# Patient Record
Sex: Female | Born: 1935 | Race: White | Hispanic: No | Marital: Married | Smoking: Never smoker
Health system: Southern US, Community
[De-identification: ages and names within clinical notes are randomized; demographics above are authoritative.]

## PROBLEM LIST (undated history)

## (undated) DIAGNOSIS — Z789 Other specified health status: Secondary | ICD-10-CM

## (undated) DIAGNOSIS — F419 Anxiety disorder, unspecified: Secondary | ICD-10-CM

## (undated) DIAGNOSIS — J329 Chronic sinusitis, unspecified: Secondary | ICD-10-CM

## (undated) DIAGNOSIS — M545 Low back pain, unspecified: Secondary | ICD-10-CM

## (undated) DIAGNOSIS — I340 Nonrheumatic mitral (valve) insufficiency: Secondary | ICD-10-CM

## (undated) DIAGNOSIS — R9430 Abnormal result of cardiovascular function study, unspecified: Secondary | ICD-10-CM

## (undated) DIAGNOSIS — I341 Nonrheumatic mitral (valve) prolapse: Secondary | ICD-10-CM

## (undated) DIAGNOSIS — K601 Chronic anal fissure: Secondary | ICD-10-CM

## (undated) DIAGNOSIS — K219 Gastro-esophageal reflux disease without esophagitis: Secondary | ICD-10-CM

## (undated) DIAGNOSIS — M797 Fibromyalgia: Secondary | ICD-10-CM

## (undated) DIAGNOSIS — M1990 Unspecified osteoarthritis, unspecified site: Secondary | ICD-10-CM

## (undated) DIAGNOSIS — K635 Polyp of colon: Secondary | ICD-10-CM

## (undated) DIAGNOSIS — K449 Diaphragmatic hernia without obstruction or gangrene: Secondary | ICD-10-CM

## (undated) DIAGNOSIS — E785 Hyperlipidemia, unspecified: Secondary | ICD-10-CM

## (undated) DIAGNOSIS — M419 Scoliosis, unspecified: Secondary | ICD-10-CM

## (undated) DIAGNOSIS — E041 Nontoxic single thyroid nodule: Secondary | ICD-10-CM

## (undated) DIAGNOSIS — C439 Malignant melanoma of skin, unspecified: Secondary | ICD-10-CM

## (undated) HISTORY — DX: Polyp of colon: K63.5

## (undated) HISTORY — PX: ABDOMINAL HYSTERECTOMY: SHX81

## (undated) HISTORY — DX: Nontoxic single thyroid nodule: E04.1

## (undated) HISTORY — DX: Low back pain, unspecified: M54.50

## (undated) HISTORY — PX: BACK SURGERY: SHX140

## (undated) HISTORY — DX: Malignant melanoma of skin, unspecified: C43.9

## (undated) HISTORY — DX: Hyperlipidemia, unspecified: E78.5

## (undated) HISTORY — PX: FOOT SURGERY: SHX648

## (undated) HISTORY — DX: Unspecified osteoarthritis, unspecified site: M19.90

## (undated) HISTORY — DX: Nonrheumatic mitral (valve) insufficiency: I34.0

## (undated) HISTORY — DX: Fibromyalgia: M79.7

## (undated) HISTORY — DX: Other specified health status: Z78.9

## (undated) HISTORY — DX: Chronic anal fissure: K60.1

## (undated) HISTORY — PX: CHOLECYSTECTOMY: SHX55

## (undated) HISTORY — DX: Low back pain: M54.5

## (undated) HISTORY — PX: HERNIA REPAIR: SHX51

## (undated) HISTORY — DX: Abnormal result of cardiovascular function study, unspecified: R94.30

## (undated) HISTORY — DX: Gastro-esophageal reflux disease without esophagitis: K21.9

## (undated) HISTORY — PX: NASAL SINUS SURGERY: SHX719

## (undated) HISTORY — DX: Nonrheumatic mitral (valve) prolapse: I34.1

## (undated) HISTORY — DX: Diaphragmatic hernia without obstruction or gangrene: K44.9

## (undated) HISTORY — DX: Chronic sinusitis, unspecified: J32.9

## (undated) HISTORY — DX: Anxiety disorder, unspecified: F41.9

## (undated) HISTORY — DX: Scoliosis, unspecified: M41.9

---

## 1997-08-01 ENCOUNTER — Ambulatory Visit (HOSPITAL_COMMUNITY): Admission: RE | Admit: 1997-08-01 | Discharge: 1997-08-02 | Payer: Self-pay | Admitting: General Surgery

## 1997-08-09 ENCOUNTER — Other Ambulatory Visit: Admission: RE | Admit: 1997-08-09 | Discharge: 1997-08-09 | Payer: Self-pay | Admitting: General Surgery

## 1998-01-26 ENCOUNTER — Other Ambulatory Visit: Admission: RE | Admit: 1998-01-26 | Discharge: 1998-01-26 | Payer: Self-pay | Admitting: Plastic Surgery

## 1999-07-05 ENCOUNTER — Encounter: Payer: Self-pay | Admitting: Internal Medicine

## 1999-07-25 ENCOUNTER — Encounter: Admission: RE | Admit: 1999-07-25 | Discharge: 1999-07-25 | Payer: Self-pay | Admitting: Obstetrics and Gynecology

## 1999-07-25 ENCOUNTER — Encounter: Payer: Self-pay | Admitting: Obstetrics and Gynecology

## 1999-08-07 ENCOUNTER — Ambulatory Visit (HOSPITAL_COMMUNITY): Admission: RE | Admit: 1999-08-07 | Discharge: 1999-08-07 | Payer: Self-pay | Admitting: Internal Medicine

## 1999-08-07 ENCOUNTER — Encounter: Payer: Self-pay | Admitting: Internal Medicine

## 2000-01-30 ENCOUNTER — Encounter: Admission: RE | Admit: 2000-01-30 | Discharge: 2000-01-30 | Payer: Self-pay | Admitting: Family Medicine

## 2000-01-30 ENCOUNTER — Encounter: Payer: Self-pay | Admitting: Family Medicine

## 2000-08-01 ENCOUNTER — Encounter: Payer: Self-pay | Admitting: Obstetrics and Gynecology

## 2000-08-01 ENCOUNTER — Encounter: Admission: RE | Admit: 2000-08-01 | Discharge: 2000-08-01 | Payer: Self-pay | Admitting: Obstetrics and Gynecology

## 2001-01-06 ENCOUNTER — Encounter (INDEPENDENT_AMBULATORY_CARE_PROVIDER_SITE_OTHER): Payer: Self-pay | Admitting: *Deleted

## 2001-01-06 ENCOUNTER — Ambulatory Visit (HOSPITAL_COMMUNITY): Admission: RE | Admit: 2001-01-06 | Discharge: 2001-01-06 | Payer: Self-pay | Admitting: Urology

## 2001-08-04 ENCOUNTER — Encounter: Admission: RE | Admit: 2001-08-04 | Discharge: 2001-08-04 | Payer: Self-pay | Admitting: Obstetrics and Gynecology

## 2001-08-04 ENCOUNTER — Encounter: Payer: Self-pay | Admitting: Obstetrics and Gynecology

## 2002-08-09 ENCOUNTER — Encounter: Admission: RE | Admit: 2002-08-09 | Discharge: 2002-08-09 | Payer: Self-pay | Admitting: Obstetrics and Gynecology

## 2002-08-09 ENCOUNTER — Encounter: Payer: Self-pay | Admitting: Obstetrics and Gynecology

## 2003-01-05 ENCOUNTER — Encounter: Payer: Self-pay | Admitting: Rheumatology

## 2003-01-05 ENCOUNTER — Encounter: Admission: RE | Admit: 2003-01-05 | Discharge: 2003-01-05 | Payer: Self-pay | Admitting: Rheumatology

## 2003-08-16 ENCOUNTER — Encounter: Admission: RE | Admit: 2003-08-16 | Discharge: 2003-08-16 | Payer: Self-pay | Admitting: Obstetrics and Gynecology

## 2004-04-03 ENCOUNTER — Ambulatory Visit: Payer: Self-pay | Admitting: Cardiology

## 2004-08-17 ENCOUNTER — Encounter: Admission: RE | Admit: 2004-08-17 | Discharge: 2004-08-17 | Payer: Self-pay | Admitting: Obstetrics and Gynecology

## 2004-08-28 ENCOUNTER — Ambulatory Visit: Payer: Self-pay | Admitting: Internal Medicine

## 2004-08-29 ENCOUNTER — Encounter (INDEPENDENT_AMBULATORY_CARE_PROVIDER_SITE_OTHER): Payer: Self-pay | Admitting: *Deleted

## 2004-08-29 ENCOUNTER — Ambulatory Visit: Payer: Self-pay | Admitting: Internal Medicine

## 2004-11-13 ENCOUNTER — Ambulatory Visit: Payer: Self-pay | Admitting: Cardiology

## 2005-01-04 ENCOUNTER — Ambulatory Visit: Payer: Self-pay | Admitting: Cardiology

## 2005-06-04 ENCOUNTER — Ambulatory Visit: Payer: Self-pay | Admitting: Cardiology

## 2005-08-19 ENCOUNTER — Encounter: Admission: RE | Admit: 2005-08-19 | Discharge: 2005-08-19 | Payer: Self-pay | Admitting: Obstetrics and Gynecology

## 2005-10-31 ENCOUNTER — Ambulatory Visit: Payer: Self-pay | Admitting: Internal Medicine

## 2005-11-21 ENCOUNTER — Ambulatory Visit: Payer: Self-pay | Admitting: Cardiology

## 2006-02-20 ENCOUNTER — Ambulatory Visit: Payer: Self-pay | Admitting: Internal Medicine

## 2006-02-27 ENCOUNTER — Ambulatory Visit: Payer: Self-pay

## 2006-02-27 ENCOUNTER — Encounter: Payer: Self-pay | Admitting: Cardiology

## 2006-03-20 ENCOUNTER — Ambulatory Visit: Payer: Self-pay | Admitting: Otolaryngology

## 2006-04-18 ENCOUNTER — Ambulatory Visit: Payer: Self-pay | Admitting: Cardiology

## 2006-06-06 ENCOUNTER — Emergency Department (HOSPITAL_COMMUNITY): Admission: EM | Admit: 2006-06-06 | Discharge: 2006-06-06 | Payer: Self-pay | Admitting: Family Medicine

## 2006-06-26 ENCOUNTER — Ambulatory Visit: Payer: Self-pay | Admitting: Cardiology

## 2006-06-26 LAB — CONVERTED CEMR LAB
Cholesterol: 193 mg/dL (ref 0–200)
HDL: 52 mg/dL (ref >39.0)
LDL Cholesterol: 112 mg/dL — ABNORMAL HIGH (ref 0–99)
Total CHOL/HDL Ratio: 3.7
Total Protein: 6.6 g/dL (ref 6.0–8.3)
Triglycerides: 146 mg/dL (ref 0–149)
VLDL: 29 mg/dL (ref 0–40)

## 2006-08-21 ENCOUNTER — Ambulatory Visit: Payer: Self-pay | Admitting: Cardiology

## 2006-08-22 ENCOUNTER — Encounter: Admission: RE | Admit: 2006-08-22 | Discharge: 2006-08-22 | Payer: Self-pay | Admitting: Obstetrics and Gynecology

## 2006-09-11 ENCOUNTER — Ambulatory Visit: Payer: Self-pay | Admitting: Cardiology

## 2006-09-11 LAB — CONVERTED CEMR LAB
ALT: 17 units/L (ref 0–40)
Alkaline Phosphatase: 58 units/L (ref 39–117)
Bilirubin, Direct: 0.1 mg/dL (ref 0.0–0.3)
HDL: 51.4 mg/dL (ref >39.0)
Total CHOL/HDL Ratio: 4.2
Triglycerides: 161 mg/dL — ABNORMAL HIGH (ref 0–149)
VLDL: 32 mg/dL (ref 0–40)

## 2006-10-15 ENCOUNTER — Ambulatory Visit: Payer: Self-pay | Admitting: Internal Medicine

## 2006-11-25 ENCOUNTER — Ambulatory Visit: Payer: Self-pay | Admitting: Internal Medicine

## 2006-11-25 ENCOUNTER — Encounter: Payer: Self-pay | Admitting: Internal Medicine

## 2006-12-25 ENCOUNTER — Ambulatory Visit: Payer: Self-pay | Admitting: Cardiology

## 2006-12-25 LAB — CONVERTED CEMR LAB
Direct LDL: 119.5 mg/dL
HDL: 46.7 mg/dL (ref >39.0)
Total CHOL/HDL Ratio: 4.2
VLDL: 41 mg/dL — ABNORMAL HIGH (ref 0–40)

## 2007-03-20 ENCOUNTER — Ambulatory Visit: Payer: Self-pay | Admitting: Cardiology

## 2007-07-16 ENCOUNTER — Ambulatory Visit: Payer: Self-pay | Admitting: Cardiology

## 2007-07-16 LAB — CONVERTED CEMR LAB
Albumin: 3.5 g/dL (ref 3.5–5.2)
Alkaline Phosphatase: 47 units/L (ref 39–117)
Cholesterol: 228 mg/dL (ref 0–200)
Direct LDL: 151.8 mg/dL
HDL: 55.1 mg/dL (ref >39.0)
Total CHOL/HDL Ratio: 4.1
Total Protein: 6.5 g/dL (ref 6.0–8.3)
Triglycerides: 147 mg/dL (ref 0–149)

## 2007-08-15 DIAGNOSIS — K449 Diaphragmatic hernia without obstruction or gangrene: Secondary | ICD-10-CM | POA: Insufficient documentation

## 2007-08-15 DIAGNOSIS — D126 Benign neoplasm of colon, unspecified: Secondary | ICD-10-CM | POA: Insufficient documentation

## 2007-08-15 DIAGNOSIS — K2270 Barrett's esophagus without dysplasia: Secondary | ICD-10-CM | POA: Insufficient documentation

## 2007-08-15 DIAGNOSIS — Z9849 Cataract extraction status, unspecified eye: Secondary | ICD-10-CM | POA: Insufficient documentation

## 2007-08-15 DIAGNOSIS — K219 Gastro-esophageal reflux disease without esophagitis: Secondary | ICD-10-CM | POA: Insufficient documentation

## 2007-08-15 DIAGNOSIS — M1990 Unspecified osteoarthritis, unspecified site: Secondary | ICD-10-CM | POA: Insufficient documentation

## 2007-08-28 ENCOUNTER — Encounter: Admission: RE | Admit: 2007-08-28 | Discharge: 2007-08-28 | Payer: Self-pay | Admitting: Obstetrics and Gynecology

## 2007-09-16 ENCOUNTER — Ambulatory Visit: Payer: Self-pay | Admitting: Cardiology

## 2007-12-17 ENCOUNTER — Ambulatory Visit: Payer: Self-pay | Admitting: Cardiology

## 2007-12-17 LAB — CONVERTED CEMR LAB
AST: 18 units/L (ref 0–37)
Albumin: 3.7 g/dL (ref 3.5–5.2)
Alkaline Phosphatase: 55 units/L (ref 39–117)
Cholesterol: 226 mg/dL (ref 0–200)
Direct LDL: 132 mg/dL
Total Protein: 6.6 g/dL (ref 6.0–8.3)
Triglycerides: 146 mg/dL (ref 0–149)
VLDL: 29 mg/dL (ref 0–40)

## 2007-12-23 ENCOUNTER — Ambulatory Visit: Payer: Self-pay | Admitting: Cardiology

## 2008-01-29 ENCOUNTER — Ambulatory Visit (HOSPITAL_COMMUNITY): Admission: RE | Admit: 2008-01-29 | Discharge: 2008-01-29 | Payer: Self-pay | Admitting: Rheumatology

## 2008-06-16 ENCOUNTER — Encounter: Payer: Self-pay | Admitting: Cardiology

## 2008-08-15 ENCOUNTER — Encounter: Payer: Self-pay | Admitting: Cardiology

## 2008-08-29 ENCOUNTER — Encounter: Admission: RE | Admit: 2008-08-29 | Discharge: 2008-08-29 | Payer: Self-pay | Admitting: Internal Medicine

## 2008-10-05 ENCOUNTER — Encounter (INDEPENDENT_AMBULATORY_CARE_PROVIDER_SITE_OTHER): Payer: Self-pay | Admitting: *Deleted

## 2008-10-27 ENCOUNTER — Ambulatory Visit: Payer: Self-pay | Admitting: Internal Medicine

## 2008-12-31 ENCOUNTER — Encounter: Payer: Self-pay | Admitting: Cardiology

## 2008-12-31 DIAGNOSIS — IMO0001 Reserved for inherently not codable concepts without codable children: Secondary | ICD-10-CM | POA: Insufficient documentation

## 2009-01-02 ENCOUNTER — Ambulatory Visit: Payer: Self-pay | Admitting: Cardiology

## 2009-01-10 ENCOUNTER — Encounter: Payer: Self-pay | Admitting: Cardiology

## 2009-02-15 ENCOUNTER — Encounter (INDEPENDENT_AMBULATORY_CARE_PROVIDER_SITE_OTHER): Payer: Self-pay | Admitting: *Deleted

## 2009-03-14 ENCOUNTER — Encounter (INDEPENDENT_AMBULATORY_CARE_PROVIDER_SITE_OTHER): Payer: Self-pay | Admitting: *Deleted

## 2009-03-14 ENCOUNTER — Ambulatory Visit: Payer: Self-pay | Admitting: Internal Medicine

## 2009-03-15 ENCOUNTER — Encounter: Admission: RE | Admit: 2009-03-15 | Discharge: 2009-03-15 | Payer: Self-pay | Admitting: Surgery

## 2009-03-28 ENCOUNTER — Ambulatory Visit: Payer: Self-pay | Admitting: Internal Medicine

## 2009-04-05 ENCOUNTER — Encounter: Admission: RE | Admit: 2009-04-05 | Discharge: 2009-04-05 | Payer: Self-pay | Admitting: Internal Medicine

## 2009-04-18 ENCOUNTER — Other Ambulatory Visit: Admission: RE | Admit: 2009-04-18 | Discharge: 2009-04-18 | Payer: Self-pay | Admitting: Diagnostic Radiology

## 2009-04-18 ENCOUNTER — Encounter: Admission: RE | Admit: 2009-04-18 | Discharge: 2009-04-18 | Payer: Self-pay | Admitting: Surgery

## 2009-05-19 ENCOUNTER — Ambulatory Visit (HOSPITAL_COMMUNITY): Admission: RE | Admit: 2009-05-19 | Discharge: 2009-05-19 | Payer: Self-pay | Admitting: Rheumatology

## 2009-06-28 ENCOUNTER — Encounter: Payer: Self-pay | Admitting: Cardiology

## 2009-09-05 ENCOUNTER — Encounter: Admission: RE | Admit: 2009-09-05 | Discharge: 2009-09-05 | Payer: Self-pay | Admitting: Internal Medicine

## 2009-10-10 ENCOUNTER — Ambulatory Visit (HOSPITAL_COMMUNITY): Admission: RE | Admit: 2009-10-10 | Discharge: 2009-10-10 | Payer: Self-pay | Admitting: Rheumatology

## 2009-12-15 ENCOUNTER — Ambulatory Visit: Payer: Self-pay | Admitting: Cardiology

## 2010-01-12 ENCOUNTER — Ambulatory Visit: Payer: Self-pay | Admitting: Otolaryngology

## 2010-01-23 ENCOUNTER — Telehealth (INDEPENDENT_AMBULATORY_CARE_PROVIDER_SITE_OTHER): Payer: Self-pay | Admitting: *Deleted

## 2010-03-10 ENCOUNTER — Emergency Department: Payer: Self-pay | Admitting: Emergency Medicine

## 2010-04-03 ENCOUNTER — Telehealth: Payer: Self-pay | Admitting: Cardiology

## 2010-05-06 ENCOUNTER — Encounter: Payer: Self-pay | Admitting: Rheumatology

## 2010-05-17 NOTE — Progress Notes (Signed)
Summary: heart racing and questions about medication   Phone Note Call from Patient Call back at Home Phone [phone]   Caller: Patient Summary of Call: Pt calling with questions about medications pt is having heart racing up to a 120 Initial call taken by: Judie Grieve,  April 03, 2010 8:57 AM  Follow-up for Phone Call        spoke w/pts daughter she states pt was in the hospital and had back surgery and dev. MRSA infection and had to have a 2nd surgery, pt is home now and daughter is concerned b/c pts HR seems to be elevated, PT noticed it around 120 yest, nurse was also out to her home yest and noted HR around 110, she thinks her BP has been elevated around 140, she states pt has been in a lot of pain and is unable to take anything except tylenol due to sundowners, she is sch to see PCP tom advised they can do EKG and check CBC and discuss any other options for pain management as this could be the reason for elevated HR she is agreeable  Follow-up by: Meredith Staggers, RN,  April 03, 2010 10:12 AM

## 2010-05-17 NOTE — Assessment & Plan Note (Signed)
Summary: f1y  Medications Added NIASPAN 1000 MG CR-TABS (NIACIN (ANTIHYPERLIPIDEMIC)) at bedtime      Allergies Added: ! NSAIDS  Visit Type:  Follow-up Primary Provider:  Tomi Bamberger, MD  CC:  mitral valve prolapse.  History of Present Illness: The patient is seen for followup of mitral valve prolapse and chest pain.  I saw her last December, 2010.  She has rare chest discomfort.  She's not having any shortness of breath.  Dr. Toni Arthurs is working with her and trying everything possible to treat her lipid abnormalities.  Unfortunately the patient is very intolerant of her meds.  Current Medications (verified): 1)  Nexium 40 Mg Cpdr (Esomeprazole Magnesium) .... Take 1 Tablet By Mouth Two Times A Day 2)  Metoprolol Tartrate 50 Mg Tabs (Metoprolol Tartrate) .... Take One-Half Tablet By Mouth Twice A Day 3)  Niaspan 1000 Mg Cr-Tabs (Niacin (Antihyperlipidemic)) .... At Bedtime 4)  Neurontin 100 Mg Caps (Gabapentin) .... Take 1 Capsule Up To Three Times A Day 5)  Calcium Carbonate-Vitamin D 600-400 Mg-Unit  Tabs (Calcium Carbonate-Vitamin D) .... Take One Tablet By Mouth Twice Daily. 6)  Fish Oil   Oil (Fish Oil) .... 1000mg  Three Times A Day 7)  Co Q-10 100 Mg Caps (Coenzyme Q10) .... Take One Tablet By Mouth Twice Daily.  Allergies (verified): 1)  ! Nsaids  Past History:  Past Medical History: Dyslipidemia Chest pain.... felt to be atypical and noncardiac in the past Statin intolerance... as of September, 2009 no further attempts to use Mitral valve prolapse... mild... trivial mitral regurgitation....echo November 2007 Ejection fraction 60%  . echo... November 2007 Atrial septum bows from left to right compatible with increased LA pressure... echo... November 2007 HIATAL HERNIA (ICD-553.3) OSTEOARTHRITIS, GENERALIZED (ICD-715.00) DYSLIPIDEMIA (ICD-272.4) GERD (ICD-530.81) ADENOMATOUS COLONIC POLYP (ICD-211.3) BARRETTS ESOPHAGUS (ICD-530.85) MITRAL VALVE PROLAPSE, HX OF  (ICD-V12.50) CATARACT EXTRACTION, HX OF (ICD-V45.61) Fibromyalgia Sinus and nose surgery.... multiple occasions in the past  Review of Systems       Patient denies fever, chills, headache, sweats, rash, change in vision, change in hearing, chest pain, cough, nausea vomiting, urinary symptoms.  All of the systems are reviewed and are negative.  Vital Signs:  Patient profile:   75 year old female Height:      58 inches Weight:      140 pounds BMI:     29.37 Pulse rate:   84 / minute BP sitting:   112 / 70  (left arm) Cuff size:   regular  Vitals Entered By: Hardin Negus, RMA (December 15, 2009 10:12 AM)  Physical Exam  General:  patient is stable. Eyes:  no xanthelasma. Neck:  no jugular venous extension. Lungs:  lungs are clear.  Respiratory effort is not labored. Heart:  cardiac exam reveals S1-S2.  There is a very soft systolic murmur. Abdomen:  abdomen is soft. Extremities:  no peripheral edema. Psych:  patient is oriented to person time and place her affect is normal.   Impression & Recommendations:  Problem # 1:  MITRAL REGURGITATION (ICD-396.3) By physical exam the patient's mitral regurgitation is limited.  She does have mitral valve prolapse by history but it is mild.  I feel she does not need a followup echo at this time.  We will reconsider next year.  Problem # 2:  CHEST PAIN (ICD-786.50)  Her updated medication list for this problem includes:    Metoprolol Tartrate 50 Mg Tabs (Metoprolol tartrate) .Marland Kitchen... Take one-half tablet by mouth twice a day The  patient has infrequent chest pain.  It does not sound ischemic.  No further workup needed.  Problem # 3:  DYSLIPIDEMIA (ICD-272.4)  Her updated medication list for this problem includes:    Niaspan 1000 Mg Cr-tabs (Niacin (antihyperlipidemic)) .Marland Kitchen... At bedtime Dr. Toni Arthurs is doing a great job trying all options to treat the patient's lipids.  Patient Instructions: 1)  Your physician recommends that you  schedule a follow-up appointment in: 1 year Prescriptions: METOPROLOL TARTRATE 50 MG TABS (METOPROLOL TARTRATE) Take one-half tablet by mouth twice a day  #90 x 4   Entered by:   Ledon Snare, RN   Authorized by:   Talitha Givens, MD, [facility]   Signed by:   Ledon Snare, RN on 12/15/2009   Method used:   Electronically to        [facility]* (retail)             ,          Ph: [phone]       Fax: [phone]   RxID:   [id number]

## 2010-05-17 NOTE — Progress Notes (Signed)
Summary: Records Request  Faxed OV & EKG to [facility] at [facility] [phone]). Debby Freiberg  January 23, 2010 12:32 PM

## 2010-08-23 ENCOUNTER — Other Ambulatory Visit: Payer: Self-pay | Admitting: Surgery

## 2010-08-23 DIAGNOSIS — E063 Autoimmune thyroiditis: Secondary | ICD-10-CM

## 2010-08-28 ENCOUNTER — Other Ambulatory Visit: Payer: Self-pay | Admitting: Dermatology

## 2010-08-28 NOTE — Assessment & Plan Note (Signed)
 [facility]ALTHCARE                            CARDIOLOGY OFFICE NOTE   NAME:[name] [name], [name] [name]                         MRN:          [id number]  DATE:08/21/2006                            DOB:          [birth year]    [name] [name] is doing well.  She has rare chest discomfort.  We do not think  it is ischemic in origin.  There was a question of mitral valve  prolapse, and she had a followup 2D echo that was done on February 27, 2006 that showed normal LV function.  There was mild thickening of the  mitral valve with trivial mitral regurgitation.   ALLERGIES:  ANTI-INFLAMMATORY MED.   MEDICATIONS:  1. Lopressor.  2. Calcium.  3. Nexium.  4. Pravachol 20.  5. Premarin.   OTHER MEDICAL PROBLEMS:  See the list below.   REVIEW OF SYSTEMS:  She is feeling well at this time with rare chest  discomfort.  She had felt poorly on Lipitor.  She was switched to  Pravachol.  We increased her dose to 20 mg, and we will now recheck a  fasting lipid.   Review of Systems, otherwise, is negative.   PHYSICAL EXAMINATION:  VITAL SIGNS:  Weight 144 pounds, blood pressure  102/74, pulse 63.  GENERAL:  The patient is oriented to person, time, and place.  Affect is  normal.  HEENT:  Reveals no xanthelasma.  She has normal extraocular motion.  Her  conjunctivae are normal.  NECK:  There are no carotid bruits.  There is  no jugular venous distention.  LUNGS:  Clear.  Respiratory effort is not labored.  CARDIAC:  Reveals S1 with S2.  There are no clicks or significant  murmurs.  ABDOMEN:  Soft.  There are normal bowel sounds.  EXTREMITIES:  She has no peripheral edema.  MUSCULOSKELETAL:  There are no musculoskeletal deformities.   EKG shows sinus rhythm and no significant abnormalities.  2D echo had  been done on November 2007 showing good LV function and no marked  valvular abnormalities.   PROBLEMS:  1. History of mild mitral valve prolapse.  Recent echo showed no  marked abnormalities.  2. Dyslipidemia.  Will check a fasting lipid on 20 of Pravachol.  She      does not tolerate Lipitor.  3. GERD with Barrett's esophagus.  4. History of colon polyps.  5. Osteoarthritis.  6. History of atypical chest pain thought to be not cardiac.  7. Status post sinus and nose surgery on multiple occasions.   The patient is stable.  I will see her back in one year.     Luis Abed, MD, [facility]  Electronically Signed    JDK/MedQ  DD: 08/21/2006  DT: 08/21/2006  Job #: [id number]

## 2010-08-28 NOTE — Assessment & Plan Note (Signed)
[name] [name]                         GASTROENTEROLOGY OFFICE NOTE   NAME:[name], [name] MCDUFFEY                         MRN:          [id number]  DATE:10/15/2006                            DOB:          04-19-1935    [name] [name] is a 75 year old white female with history of Barrett's  esophagus on upper endoscopy in 1997.  Her subsequent endoscopies in  1998, 2001, and 2006 did not show evidence of intestinal metaplasia, but  were consistent with chronic reflux esophagitis.  Patient has been on  daily proton pump inhibitors.  In fact, recently she has increased her  Nexium to 40 mg twice a day with almost complete relief of her acid  reflux symptoms.  She is here for repeat upper endoscopy.   [name] [name] also has a history of colon polyps found on colonoscopy in  1997.  Her last colonoscopy in 2001 did not show any polyps.  There is  no family history of colon cancer.  According to new guidelines for  colon recall, she could wait as long as 10 years from the last  colonoscopy, which would be March 2011.  She would like to extend it out  to that time since she is not having any lower GI symptoms.   PHYSICAL EXAMINATION:  Blood pressure 114/62.  Pulse 60.  Weight 141  pounds.  Patient was alert, oriented, and in no distress.  She was not examined  today.   IMPRESSION:  1. Barrett's esophagus in 1997.  Not confirmed on subsequent      endoscopies in 1998, 2001, and 2006.  2. History of adenomatous polyp of the colon in 1997.  Normal      colonoscopy in 2001.   PLAN:  1. Upper endoscopy scheduled for followup of Barrett's.  2. Continue Nexium 40 mg p.o. b.i.d.  3. Colonoscopy in March 2011.     Hedwig Morton. Juanda Chance, MD  Electronically Signed    DMB/MedQ  DD: 10/15/2006  DT: 10/15/2006  Job #: [id number]   cc:   Quita Skye. Artis Flock, M.D.

## 2010-08-28 NOTE — Assessment & Plan Note (Signed)
[facility]                            CARDIOLOGY OFFICE NOTE   NAME:[name] [name], [name] [name]                         MRN:          [id number]  DATE:09/16/2007                            DOB:          11/03/1935    HISTORY OF PRESENT ILLNESS:  [name] [name] is here for cardiology followup.  She is not having any chest pain.  She does not have proven coronary  disease.  She has history of mild mitral valve prolapse and trivial  mitral regurgitation.  She does have dyslipidemia.  She has had aching  in her legs from Lipitor and Pravachol.  We rechecked her labs after she  is off Pravachol, and her LDL is back up to 151 with her triglycerides  at 147 and her HDL is 55.   PAST MEDICAL HISTORY:   ALLERGIES:  Some anti-inflammatory medicines.   MEDICATIONS:  Lopressor, calcium, Premarin, Nexium, vitamin D and fish  oil.   OTHER MEDICAL PROBLEMS:  See the list on my note of March 20, 2007.   REVIEW OF SYSTEMS:  She is feeling well.  She has an area of tenderness  in the back of her skull.  I see nothing on physical exam.  It is  possible that this could be radiated arthritic pain.  Otherwise, her  review of systems is negative.   PHYSICAL EXAMINATION:  VITAL SIGNS:  Blood pressure is 118/76 with a  pulse of 74.  The patient is oriented to person, time and place.  Affect  is normal.  HEENT:  Reveals no xanthelasma.  She has normal extraocular motion.  There are no carotid bruits.  There is no jugular venous distention.  LUNGS:  Clear.  Respiratory effort is not labored.  CARDIAC:  Reveals S1-S2.  There are no clicks or significant murmurs  today.  ABDOMEN:  Soft.  EXTREMITIES:  She has no peripheral edema.   LABORATORY DATA:  See the discussion of her lipid values above.   PROBLEMS:  Problems are listed on the note of March 20, 2007.  #2.  Dyslipidemia.  With an LDL of 152, I do think that adding a statin  is still important for her.  She is willing to try 5  mg of Crestor.  We  will make arrangements for this.  I will then see her for follow-up.     Luis Abed, MD, [facility]  Electronically Signed   JDK/MedQ  DD: 09/16/2007  DT: 09/16/2007  Job #: [id number]   cc:   Quita Skye. Artis Flock, M.D.

## 2010-08-28 NOTE — Assessment & Plan Note (Signed)
[place] [facility]                            CARDIOLOGY OFFICE NOTE   NAME:[name], [name] CURLIN                         MRN:          [id number]  DATE:12/23/2007                            DOB:          01/14/1936    [name] [name] is doing well.  I saw her last in June 2009.  We tried a small  dose of Crestor.  She felt poorly and it has been stopped.  It is now  clear that she will not tolerate a statin and we should not try again.   Otherwise, she is really quite stable.  Her followup of cholesterol 1  week after she stopped her Crestor showed an LDL of 130 with an HDL of  50 and triglycerides of 145.   ALLERGIES:  ANTI-INFLAMMATORY MEDICINES.   MEDICATIONS:  Lopressor, calcium, Premarin, Nexium, fish oil, and  Neurontin.   OTHER MEDICAL PROBLEMS:  See the complete list on the note of March 20, 2007.   REVIEW OF SYSTEMS:  She is feeling well.  Her review of systems is  negative.   PHYSICAL EXAMINATION:  VITAL SIGNS:  Blood pressure is 131/84 with a  pulse of 82.  GENERAL:  The patient is oriented to person, time, and place.  Affect is  normal.  HEENT:  No xanthelasma.  She has normal extraocular motion.  NECK:  There are no carotid bruits.  There is no jugular venous  distention.  LUNGS:  Clear.  Respiratory effort is nonlabored.  CARDIAC:  An S1 with an S2.  There are no clicks or significant murmurs.  ABDOMEN:  Soft.  EXTREMITIES:  She has no peripheral edema.   No labs were done today.   Problems are listed on the note of March 20, 2007.  1. She has mild mitral regurgitation and this can be followed in the      future.  2. Dyslipidemia.  We cannot use a statin and we will follow her      clinically.  She is encouraged to exercise, lose weight, and watch      her diet.  Other problems are listed.  Cardiac status is stable.  I      will see her back in 1 year.  We should check her cholesterol again      in a year.     Luis Abed, MD,  [facility]  Electronically Signed    JDK/MedQ  DD: 12/23/2007  DT: 12/24/2007  Job #: [id number]   cc:   Quita Skye. Artis Flock, M.D.

## 2010-08-28 NOTE — Assessment & Plan Note (Signed)
[place] [facility]                            CARDIOLOGY OFFICE NOTE   NAME:Putzier, BREEANNA GALGANO                         MRN:          [id number]  DATE:03/20/2007                            DOB:          09-Mar-1936    Ms. Mcmackin is here for cardiology followup.  She had been seen in the past  by Dr. Karma Ganja, and her records have been sent to Dr. Artis Flock.  She  has not seen Dr. Artis Flock yet.  I made clear to her that we would send  cardiology records there so that he would have all the information as  needed over time.   The patient has not had chest pain.  She has had a mild mitral valve  abnormality.  She does have abnormal lipids, but there has never been  proven coronary disease.  She is feeling well at this time.   PAST MEDICAL HISTORY:   ALLERGIES:  ANTIINFLAMMATORY MEDS.   MEDICATIONS:  Lopressor.  Calcium.  Premarin.  Nexium.  Fish oil.  Pravachol (to be stopped).   OTHER MEDICAL PROBLEMS:  See the list below.   REVIEW OF SYSTEMS:  The patient's major symptom is discomfort in her  legs.  She feels that this is related to the Pravachol.  She thinks it  has gotten worse as the Pravachol dosing has been increased.  Otherwise,  her review of systems is negative.   PHYSICAL EXAM:  Blood pressure 116/78 with a pulse of 73.  The patient is oriented to person, time, and place.  Affect is normal.  HEENT:  Reveals no xanthelasma.  She has normal extraocular motions.  There are no carotid bruits.  There is no jugular venous distension.  LUNGS:  Clear.  Respiratory effort is not labored.  CARDIAC:  Reveals an S1 with an S2.  There are no clicks or significant  murmurs.  Her abdomen is soft.  She has no peripheral edema.   EKG reveals no change.   PROBLEMS:  1. History of mild mitral valve prolapse.  She has only trivial mitral      regurgitation.  With the change in guidelines, it is no longer      necessary for her to use antibiotics for dental work.  She and  I      discussed this.  2. Dyslipidemia.  Because of the symptoms in her legs, we will      completely stop her Pravachol.  We will check fasting lipids in      approximately 3 or 4 months and then see her back after that time.      We will try to keep away from Statins and use other therapies only      if she clearly needs them.  3. History of gastroesophageal reflux disease with Barrett's      esophagus.  4. History of colon polyps.  5. Osteoarthritis.  6. History of some atypical chest pain, in the past thought not to be      cardiac.  7. Status post sinus and nose surgery on multiple occasions in  the      past.   Overall, she is stable.  Plan as outlined above.     Luis Abed, MD, [facility]  Electronically Signed    JDK/MedQ  DD: 03/20/2007  DT: 03/20/2007  Job #: [id number]   cc:   Quita Skye. Artis Flock, M.D.

## 2010-08-29 ENCOUNTER — Ambulatory Visit
Admission: RE | Admit: 2010-08-29 | Discharge: 2010-08-29 | Disposition: A | Discharge status: Home / Self Care | Payer: BC Managed Care – PPO | Source: Ambulatory Visit | Attending: Surgery | Admitting: Surgery

## 2010-08-29 DIAGNOSIS — E063 Autoimmune thyroiditis: Secondary | ICD-10-CM

## 2010-08-31 NOTE — Assessment & Plan Note (Signed)
[place] [facility]                           GASTROENTEROLOGY OFFICE NOTE   NAME:[name] [name], [name] [name]                         MRN:          [id number]  DATE:10/31/2005                            DOB:          Feb 17, 1936    HISTORY OF PRESENT ILLNESS:  [name] [name] is a 75 year old white female with  Barrett's esophagus, diagnosed on upper endoscopy in May 2006. She is on  Nexium 40 mg a day and Zantac 150 mg at bedtime. This dose not completely  control her symptoms. She needs to be on Nexium twice a day. She came today,  just for refill of her medication. She has a history of esophageal  stricture, which was dilated in 2001. She also had a colonoscopy in the  past.   Today, I have described gastroesophageal reflux disease and her symptoms  with the pateint and we decided to increase her Nexium to 40 mg p.o. b.i.d.  She will let us know if her prescription gets denies and we will have to pre-  authorize her on Nexium prescription.                                   Hedwig Morton. Juanda Chance, MD  Was Dr. Weyman Croon Wilson's Patient  DMB/MedQ  DD:  10/31/2005  DT:  10/31/2005  Job #:  [id number]   cc:   Quita Skye. Artis Flock, MD

## 2010-08-31 NOTE — Assessment & Plan Note (Signed)
[facility]                              CARDIOLOGY OFFICE NOTE   NAME:Molder, REX MAGEE                         MRN:          [id number]  DATE:02/20/2006                            DOB:          [birth year]    ADDENDUM:  It should be noted that the patient had some problems with  Lipitor.  She had some myalgias.  I switched her to Pravachol 10 mg nightly  and will get lipids and liver function testing in 6-8 weeks.  She can follow  up with Dr. Myrtis Ser in the next 4-6 months.      Tereso Newcomer, PA-C  Electronically Signed     ______________________________  Doylene Canning. Ladona Ridgel, MD   SW/MedQ  DD: 02/20/2006  DT: 02/20/2006  Job #: [id number]

## 2010-08-31 NOTE — Assessment & Plan Note (Signed)
[place] [facility]                              CARDIOLOGY OFFICE NOTE   NAME:[name] [name], [name] [name]                         MRN:          [id number]  DATE:02/20/2006                            DOB:          28-Apr-1935    PRIMARY CARE PHYSICIAN:  Vale Haven. Andrey Campanile, M.D.   PRIMARY CARDIOLOGIST:  Luis Abed, MD, [facility]   HISTORY OF PRESENT ILLNESS:  Ms. Folse is a very pleasant 76 year old female  patient followed by Dr. Myrtis Ser with a history of mitral valve prolapse and  hyperlipidemia as well as family history of coronary disease who is in need  of sinus surgery.  She has atypical chest pain from time to time that has  been ongoing for several years now.  There has been no change there.  She  occasionally gets shortness of breath with over exertion.  Again this is  something that she has had for many years and there has been no change.  She  denies orthopnea or paroxysmal nocturnal dyspnea.  She denies any substernal  chest heaviness or pressure.  She denies any nausea, vomiting, or  diaphoresis.  She denies syncope or presyncope.   CURRENT MEDICATIONS:  1. Premarin 0.625 mg daily.  2. Lopressor 50 mg half tablet b.i.d.  3. Calcium 600 mg daily.  4. Lipitor 10 mg daily - she recently stopped this secondary to myalgias.  5. Nexium 40 mg two tablets daily.  6. Coenzyme Q-10 100 mg daily.   ALLERGIES:  ANTI-INFLAMMATORY DRUGS.   PAST MEDICAL HISTORY:  As noted above, the patient has a history of mitral  valve prolapse, gastroesophageal reflux disease with Barrett's esophagus,  history of right colon polyps, status post cholecystectomy, status post  total abdominal hysterectomy, status post multiple foot surgeries, and  status post sinus surgery x2.  She had dyslipidemia that is treated.  She  has a history of atypical chest pain.  She denies any history of diabetes  mellitus.  Osteoarthritis.   SOCIAL HISTORY:  The patient denies any tobacco or alcohol  abuse.   FAMILY HISTORY:  Significant for coronary artery disease.  Her father died  in his 44's of a myocardial infarction.   REVIEW OF SYSTEMS:  Please see HPI.  Denies any fevers, chills, cough,  melena, hematochezia, dysuria, or frequency.  Rest of the review of systems  are negative.   PHYSICAL EXAMINATION:  GENERAL:  She is a well-developed, well-nourished  female.  VITAL SIGNS:  Blood pressure 120/78, pulse 68, weight 143 pounds.  HEENT:  Normocephalic and atraumatic.  Eyes; PERRLA, sclerae clear.  NECK:  Without JVD or lymphadenopathy.  ENDOCRINE:  Without thyromegaly.  HEART:  Normal S1 and S2.  Regular rate and rhythm without murmurs, clicks,  rubs, or gallops.  LUNGS:  Clear to auscultation bilaterally without wheezing, rhonchi, or  rales.  ABDOMEN:  Soft and nontender with normal active bowel sounds.  No  organomegaly.  EXTREMITIES:  Without edema.  SKIN:  Warm and dry.  Calves are soft and nontender.  NEUROLOGY:  She is alert and oriented x3.  Cranial nerves II-XII grossly  intact.   Electrocardiogram reveals sinus rhythm with a heart rate of 67, normal axis,  no acute changes.   IMPRESSION:  1. History of mitral valve prolapse.  2. Dyslipidemia.      a.     Intolerance to Lipitor.  3. Gastroesophageal reflux disease with Barrett's esophagus.  4. History of colon polyps.  5. Osteoarthritis.  6. History of atypical chest pain - noncardiac.  7. Status post sinus surgery x2.      a.     Needs repeat sinus surgery.   PLAN:  The patient presents for surgical clearance.  She has no documented  coronary disease and had a negative Cardiolite scan back in 2002.  She has  had preserved LV function in the past as well.  She has a history of mitral  valve prolapse.  According to [facility] and AHA guidelines she requires no further  cardiac workup prior to her noncardiac surgery and she should be acceptable  risk.  I discussed with Dr. Ladona Ridgel who agreed with the above  assessment and  plan.  She does have a history of mitral valve prolapse and has not had an  echocardiogram in many years.  We will go ahead and get her echocardiogram  prior to her surgery and be in touch with her surgeon regarding those  results.     Tereso Newcomer, PA-C  Electronically Signed    SW/MedQ  DD: 02/20/2006  DT: 02/20/2006  Job #: [id number]

## 2010-08-31 NOTE — Op Note (Signed)
[facility]  Patient:    [name] [name], [name] [name] Visit Number: [id number] MRN: [id number]          Service Type: DSU Location: DAY Attending Physician:  Carson Myrtle Proc. Date: 01/06/01 Admit Date:  01/06/2001   CC:         Duffy Rhody C. Andrey Campanile, M.D.  Hedwig Morton. Juanda Chance, M.D. [facility]  Luis Abed, M.D. [facility]   Operative Report  PREOPERATIVE DIAGNOSIS:  Left inguinal hernia.  POSTOPERATIVE DIAGNOSIS:  Left direct and indirect inguinal hernia.  PROCEDURE PERFORMED:  Repair of hernia with mesh.  SURGEON:  Timothy E. Earlene Plater, M.D.  ANESTHESIA:  General.  INDICATIONS:  Mrs. Malstrom is an otherwise healthy 75 year old.  She has developed inguinal hernia with rather vigorous yard work and wishes to have a repair after careful discussion.  DESCRIPTION OF PROCEDURE:  The patient is brought to the operating room, evaluated by anesthesia, and placed supine.  General endotracheal anesthesia was administered.  The groin had been scrubbed.  It was prepped and draped in the usual fashion.  Marcaine 0.25% with epinephrine was used throughout for a wide field block anesthesia.  A generous incision was made over the left groin.  The abundant subcutaneous tissue dissection.  Bleeding points cauterized.  The external oblique dissected and opened through the external ring with care taken to avoid the ilioinguinal nerve.  The floor of the canal was completely relaxed.  There was a prominent round ligament and indirect hernia sac.  The round ligament was separated from the indirect hernia sac. Each was separately ligated and the internal ring was closed.  This was all done with 2-0 Prolene.  A piece of mesh was cut and fashioned to fit the entire floor of the canal up to and above the internal ring area.  It was sewn with a running 2-0 Prolene suture.  This completed the repair.  The external oblique was closed with a running 2-0 Vicryl, deep subcu 2-0 Vicryl and skin 3-0  Monocryl.  She tolerated it well.  The counts were correct.  Steri-Strips and dry sterile dressing applied.  She was removed to the recovery room in good condition.  Written and verbal instructions including pain medication were given and she will be seen and followed as an outpatient. Attending Physician:  Carson Myrtle DD:  01/06/01 TD:  01/06/01 Job: [id number]

## 2010-09-25 ENCOUNTER — Other Ambulatory Visit: Payer: Self-pay | Admitting: Nurse Practitioner

## 2010-09-25 DIAGNOSIS — Z1231 Encounter for screening mammogram for malignant neoplasm of breast: Secondary | ICD-10-CM

## 2010-10-09 ENCOUNTER — Ambulatory Visit: Payer: BC Managed Care – PPO

## 2010-10-18 ENCOUNTER — Ambulatory Visit
Admission: RE | Admit: 2010-10-18 | Discharge: 2010-10-18 | Disposition: A | Discharge status: Home / Self Care | Payer: BC Managed Care – PPO | Source: Ambulatory Visit | Attending: Nurse Practitioner | Admitting: Nurse Practitioner

## 2010-10-18 DIAGNOSIS — Z1231 Encounter for screening mammogram for malignant neoplasm of breast: Secondary | ICD-10-CM

## 2010-10-25 ENCOUNTER — Other Ambulatory Visit (INDEPENDENT_AMBULATORY_CARE_PROVIDER_SITE_OTHER): Payer: Self-pay | Admitting: Surgery

## 2010-10-26 NOTE — Telephone Encounter (Signed)
Last ov 09/03/2010. TSH 3.345 ON  Aug 30, 2010.  PLAN IS F/U IN ONE YEAR.

## 2010-10-31 NOTE — Telephone Encounter (Signed)
1 yr rx given

## 2010-11-08 NOTE — Telephone Encounter (Signed)
This has been filled for 1 year.

## 2010-12-13 ENCOUNTER — Encounter: Payer: Self-pay | Admitting: Cardiology

## 2010-12-13 DIAGNOSIS — Z789 Other specified health status: Secondary | ICD-10-CM | POA: Insufficient documentation

## 2010-12-13 DIAGNOSIS — R079 Chest pain, unspecified: Secondary | ICD-10-CM | POA: Insufficient documentation

## 2010-12-13 DIAGNOSIS — E785 Hyperlipidemia, unspecified: Secondary | ICD-10-CM | POA: Insufficient documentation

## 2010-12-13 DIAGNOSIS — R9430 Abnormal result of cardiovascular function study, unspecified: Secondary | ICD-10-CM | POA: Insufficient documentation

## 2010-12-13 DIAGNOSIS — M797 Fibromyalgia: Secondary | ICD-10-CM | POA: Insufficient documentation

## 2010-12-13 DIAGNOSIS — I341 Nonrheumatic mitral (valve) prolapse: Secondary | ICD-10-CM | POA: Insufficient documentation

## 2010-12-14 ENCOUNTER — Encounter: Payer: Self-pay | Admitting: Cardiology

## 2010-12-14 ENCOUNTER — Ambulatory Visit (INDEPENDENT_AMBULATORY_CARE_PROVIDER_SITE_OTHER): Payer: Medicare Other | Admitting: Cardiology

## 2010-12-14 DIAGNOSIS — M545 Low back pain, unspecified: Secondary | ICD-10-CM | POA: Insufficient documentation

## 2010-12-14 DIAGNOSIS — I059 Rheumatic mitral valve disease, unspecified: Secondary | ICD-10-CM

## 2010-12-14 DIAGNOSIS — I341 Nonrheumatic mitral (valve) prolapse: Secondary | ICD-10-CM

## 2010-12-14 DIAGNOSIS — R079 Chest pain, unspecified: Secondary | ICD-10-CM

## 2010-12-14 DIAGNOSIS — E785 Hyperlipidemia, unspecified: Secondary | ICD-10-CM

## 2010-12-14 NOTE — Patient Instructions (Signed)
Your physician has requested that you have an echocardiogram. Echocardiography is a painless test that uses sound waves to create images of your heart. It provides your doctor with information about the size and shape of your heart and how well your heart's chambers and valves are working. This procedure takes approximately one hour. There are no restrictions for this procedure.    Your physician wants you to follow-up in: 1 year with Dr Myrtis Ser. (August 2013). You will receive a reminder letter in the mail two months in advance. If you don't receive a letter, please call our office to schedule the follow-up appointment.

## 2010-12-14 NOTE — Assessment & Plan Note (Signed)
Unfortunately she has had a very complicated course with complicated surgery.  Her cardiovascular status remained stable throughout this.  Two-dimensional echo is being scheduled and I will be in touch with her with the results.  Otherwise I'll see her back in one year

## 2010-12-14 NOTE — Assessment & Plan Note (Signed)
Historically the patient has mitral valve prolapse.  In the past she had only mild mitral regurgitation.  Her last echo was 5 years ago.  It is time for followup echo to reassess mitral regurgitation and mitral valve prolapse.

## 2010-12-14 NOTE — Assessment & Plan Note (Signed)
Patient has not had any significant chest pain. No further workup. 

## 2010-12-14 NOTE — Assessment & Plan Note (Signed)
Patient has significant statin intolerance.  All of the statins have been tried.

## 2010-12-14 NOTE — Progress Notes (Signed)
HPI Patient is seen today for followup of mitral regurgitation.  I saw her last September, 2011.  From the cardiac viewpoint she is doing well.  Unfortunately she's had a very difficult time with lumbar spine surgery.  This was done at [facility].  She had a complicated procedure with surgery both to the posterior spine and an approach through the abdomen.  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  She needed to further operations.  Most of the appliances had to be removed.  She is on chronic antibiotics.  She does not give any history of endocarditis with this.  She's not having any chest pain or shortness of breath.  As part of today's evaluation I have reviewed her old records and I am completely updated the new EMR.  Allergies  Allergen Reactions  . Nsaids     REACTION: No long term use    Current Outpatient Prescriptions  Medication Sig Dispense Refill  . clonazePAM (KLONOPIN) 0.5 MG tablet Take 0.5 mg by mouth as needed.       Marland Kitchen FLUoxetine (PROZAC) 20 MG capsule Take 20 mg by mouth daily.       . metoprolol (LOPRESSOR) 50 MG tablet Take 25 mg by mouth 2 (two) times daily.       Marland Kitchen NEXIUM 40 MG capsule Take 40 mg by mouth daily before breakfast.       . PREMARIN 0.45 MG tablet Take 0.45 mg by mouth daily.       Marland Kitchen sulfamethoxazole-trimethoprim (BACTRIM DS) 800-160 MG per tablet Take 1 tablet by mouth 2 (two) times daily.       Marland Kitchen SYNTHROID 50 MCG tablet TAKE 1 TABLET BY MOUTH EVERY DAY  30 tablet  3  . traMADol (ULTRAM) 50 MG tablet Take 50 mg by mouth every 6 (six) hours as needed.        . VOLTAREN 1 % GEL Apply 1 application topically as needed.         History   Social History  . Marital Status: Married    Spouse Name: N/A    Number of Children: N/A  . Years of Education: N/A   Occupational History  . Not on file.   Social History Main Topics  . Smoking status: Never Smoker   . Smokeless tobacco: Never Used  . Alcohol Use: No  . Drug Use: No  . Sexually Active: Not on  file   Other Topics Concern  . Not on file   Social History Narrative  . No narrative on file    No family history on file.  Past Medical History  Diagnosis Date  . Dyslipidemia   . Chest pain     Felt to be noncardiac in the past  . Statin intolerance     As of 2009, no further attempts to use statins  . Mitral valve prolapse     Mild mitral regurgitation echo, 2007  . Ejection fraction     EF 60%, echo, 2007, atrial septum bows left to right compatible with increased left atrial pressure  . Hiatal hernia   . Osteoarthritis   . GERD (gastroesophageal reflux disease)     Barrett's esophagus  . Colon polyp   . Fibromyalgia   . Sinusitis     Multiple sinus operations in the past  . Lumbar spine pain     Complex lumbar spine surgery at [facility], 2012, complicated, MRSA, much of the appliance is removed, patient on chronic antibiotics    No  past surgical history on file.  ROS  Patient denies fever, chills, headache, sweats, rash, in vision, change in hearing, chest pain, cough, nausea vomiting, urinary symptoms.  All other systems are reviewed and are negative other than the history of present illness.  PHYSICAL EXAM Patient is stable today.  She is oriented to person time and place.  Affect is normal.  Head is atraumatic.  There is no xanthelasma.  There is no jugular distention.  Lungs are clear.  Respiratory effort is nonlabored.  Cardiac exam reveals S1 and S2.  There is a murmur of mitral regurgitation.  Abdomen is soft.  There is no peripheral edema.  No skin rashes.  I have not fully examined her lower back.  The scar in her abdomen is healed. Filed Vitals:   12/14/10 1149  BP: 106/73  Pulse: 83  Height: 4\' 9"  (1.448 m)  Weight: 134 lb (60.782 kg)    EKG is done today and reviewed by me.  There is normal sinus rhythm.  There is no significant abnormality.  ASSESSMENT & PLAN

## 2010-12-19 ENCOUNTER — Encounter: Payer: Self-pay | Admitting: *Deleted

## 2010-12-20 ENCOUNTER — Other Ambulatory Visit (HOSPITAL_COMMUNITY): Payer: Medicare Other | Admitting: Radiology

## 2010-12-20 ENCOUNTER — Ambulatory Visit: Payer: BC Managed Care – PPO | Admitting: Cardiology

## 2011-01-08 ENCOUNTER — Ambulatory Visit (HOSPITAL_COMMUNITY): Payer: Medicare Other | Attending: Cardiology | Admitting: Radiology

## 2011-01-08 DIAGNOSIS — E669 Obesity, unspecified: Secondary | ICD-10-CM | POA: Insufficient documentation

## 2011-01-08 DIAGNOSIS — I059 Rheumatic mitral valve disease, unspecified: Secondary | ICD-10-CM

## 2011-01-08 DIAGNOSIS — R079 Chest pain, unspecified: Secondary | ICD-10-CM | POA: Insufficient documentation

## 2011-01-08 DIAGNOSIS — M545 Low back pain, unspecified: Secondary | ICD-10-CM

## 2011-01-08 DIAGNOSIS — I341 Nonrheumatic mitral (valve) prolapse: Secondary | ICD-10-CM

## 2011-01-08 DIAGNOSIS — E785 Hyperlipidemia, unspecified: Secondary | ICD-10-CM | POA: Insufficient documentation

## 2011-01-08 DIAGNOSIS — I079 Rheumatic tricuspid valve disease, unspecified: Secondary | ICD-10-CM | POA: Insufficient documentation

## 2011-01-29 ENCOUNTER — Other Ambulatory Visit: Payer: Self-pay | Admitting: Cardiology

## 2011-01-30 ENCOUNTER — Encounter: Payer: Self-pay | Admitting: Cardiology

## 2011-01-30 NOTE — Progress Notes (Signed)
I reviewed again the report the patient's echo.  It appears that her mitral regurgitation may be worse in the past.  It is difficult to fully assess with the current study.  It will probably be helpful to proceed with a TEE.  However when I saw her last she was just getting over a very complicated back surgery.  We will leave her a message that I would like to see her in a few months.  At that time all discussed things with her further

## 2011-02-06 ENCOUNTER — Telehealth: Payer: Self-pay | Admitting: Cardiology

## 2011-02-06 NOTE — Telephone Encounter (Signed)
Pt had echo done about 3 wks ago she wants to know the results

## 2011-02-06 NOTE — Telephone Encounter (Signed)
I spoke with the [name] [name] and made her aware of echo results.  She said that she has not been given the results of this test.  The [name] [name] is scheduled to see Dr Myrtis Ser on 04/04/11.

## 2011-04-02 ENCOUNTER — Encounter: Payer: Self-pay | Admitting: Cardiology

## 2011-04-02 DIAGNOSIS — I340 Nonrheumatic mitral (valve) insufficiency: Secondary | ICD-10-CM | POA: Insufficient documentation

## 2011-04-04 ENCOUNTER — Encounter: Payer: Self-pay | Admitting: Cardiology

## 2011-04-04 ENCOUNTER — Ambulatory Visit (INDEPENDENT_AMBULATORY_CARE_PROVIDER_SITE_OTHER): Payer: Medicare Other | Admitting: Cardiology

## 2011-04-04 DIAGNOSIS — I059 Rheumatic mitral valve disease, unspecified: Secondary | ICD-10-CM

## 2011-04-04 DIAGNOSIS — R079 Chest pain, unspecified: Secondary | ICD-10-CM

## 2011-04-04 DIAGNOSIS — I340 Nonrheumatic mitral (valve) insufficiency: Secondary | ICD-10-CM

## 2011-04-04 NOTE — Assessment & Plan Note (Signed)
Patient has rare chest pain. No further workup is needed.

## 2011-04-04 NOTE — Patient Instructions (Signed)
Your physician wants you to follow-up in:  6 months. You will receive a reminder letter in the mail two months in advance. If you don't receive a letter, please call our office to schedule the follow-up appointment.   

## 2011-04-04 NOTE — Progress Notes (Signed)
HPI  Patient is seen back today to followup mitral regurgitation. I saw her last August, 2012. She has a history of some mitral regurgitation in the past. I arranged for a followup two-dimensional echo. The study was done in September, 2012. It was technically difficult. The [place] data suggested moderate MR. It was felt however that her MR was eccentric and that possibly it was worse as it wrapped the atrium. She is now here for followup.Her ejection fraction is normal at 55-60%.  Unfortunately she continues to have low back pain and cervical disc pain. She's had a complicated course related to her treatment over time. She's not having any chest pain or shortness of breath.  Allergies  Allergen Reactions  . Nsaids     REACTION: No long term use    Current Outpatient Prescriptions  Medication Sig Dispense Refill  . Choline Fenofibrate (TRILIPIX PO) Take by mouth.        . Ezetimibe (ZETIA PO) Take by mouth.        Marland Kitchen FLUoxetine (PROZAC) 20 MG capsule Take 20 mg by mouth daily.       Marland Kitchen LIDODERM 5 % as needed.      . metoprolol (LOPRESSOR) 50 MG tablet TAKE ONE-HALF (1/2) TABLET TWICE A DAY  90 tablet  3  . NEXIUM 40 MG capsule Take 40 mg by mouth daily before breakfast.       . PREMARIN 0.45 MG tablet Take 0.45 mg by mouth daily.       Marland Kitchen sulfamethoxazole-trimethoprim (BACTRIM DS) 800-160 MG per tablet Take 1 tablet by mouth 2 (two) times daily.       Marland Kitchen SYNTHROID 50 MCG tablet TAKE 1 TABLET BY MOUTH EVERY DAY  30 tablet  3  . traMADol (ULTRAM) 50 MG tablet Take 50 mg by mouth every 6 (six) hours as needed.        . VOLTAREN 1 % GEL Apply 1 application topically as needed.         History   Social History  . Marital Status: Married    Spouse Name: N/A    Number of Children: N/A  . Years of Education: N/A   Occupational History  . Not on file.   Social History Main Topics  . Smoking status: Never Smoker   . Smokeless tobacco: Never Used  . Alcohol Use: No  . Drug Use: No  .  Sexually Active: Not on file   Other Topics Concern  . Not on file   Social History Narrative  . No narrative on file    No family history on file.  Past Medical History  Diagnosis Date  . Dyslipidemia   . Chest pain     Felt to be noncardiac in the past  . Statin intolerance     As of 2009, no further attempts to use statins  . Mitral valve prolapse     Mild mitral regurgitation echo, 2007  . Ejection fraction     EF 60%, echo, 2007, atrial septum bows left to right compatible with increased left atrial pressure  . Hiatal hernia   . Osteoarthritis   . GERD (gastroesophageal reflux disease)     Barrett's esophagus  . Colon polyp   . Fibromyalgia   . Sinusitis     Multiple sinus operations in the past  . Lumbar spine pain     Complex lumbar spine surgery at [facility], 2012, complicated, MRSA, much of the appliance is removed, patient on chronic antibiotics  .  Mitral regurgitation     Mild, 2007,  /  moderate or severe echo, September, 2012, eccentric jet wrapping the left atrium, consider TEE for further assessment    No past surgical history on file.  ROS   Patient denies fever, chills, headache, sweats, rash, change in vision, change in hearing, chest pain, cough, nausea vomiting, urinary symptoms. All other systems are reviewed and are negative.  PHYSICAL EXAM  Patient is stable today. She is oriented to person time and place. Affect is normal. There is no jugulovenous distention. Lungs are clear. Respiratory effort is nonlabored. Cardiac exam reveals an S1 and S2. She does have a murmur of mitral regurgitation. It has a late systolic component to it. The abdomen is soft. There is no peripheral edema.  Filed Vitals:   04/04/11 0922  BP: 102/70  Pulse: 63  Height: 4\' 9"  (1.448 m)  Weight: 141 lb 1.9 oz (64.012 kg)     ASSESSMENT & PLAN

## 2011-04-04 NOTE — Assessment & Plan Note (Signed)
Mitral regurgitation is stable. It is mentioned that she has wrapping of the left atrium. She has good left ventricular function. She's not symptomatic. At this time I believe that her MR is late systolic. This may argue that her overall mitral regurgitation is not severe. Her last study was technically difficult. TEE may help [place] but I feel it is not necessary yet. I explained all this to her. I'll see her back in 6 months for followup and we will decide the next.

## 2011-04-15 ENCOUNTER — Encounter (HOSPITAL_COMMUNITY): Payer: Self-pay | Admitting: Emergency Medicine

## 2011-04-15 ENCOUNTER — Emergency Department (HOSPITAL_COMMUNITY)
Admission: EM | Admit: 2011-04-15 | Discharge: 2011-04-15 | Disposition: A | Discharge status: Home / Self Care | Payer: Medicare Other | Attending: Emergency Medicine | Admitting: Emergency Medicine

## 2011-04-15 ENCOUNTER — Encounter (HOSPITAL_COMMUNITY): Payer: Self-pay | Admitting: Pharmacy Technician

## 2011-04-15 ENCOUNTER — Emergency Department (HOSPITAL_COMMUNITY): Payer: Medicare Other

## 2011-04-15 DIAGNOSIS — E785 Hyperlipidemia, unspecified: Secondary | ICD-10-CM | POA: Insufficient documentation

## 2011-04-15 DIAGNOSIS — W108XXA Fall (on) (from) other stairs and steps, initial encounter: Secondary | ICD-10-CM | POA: Insufficient documentation

## 2011-04-15 DIAGNOSIS — K219 Gastro-esophageal reflux disease without esophagitis: Secondary | ICD-10-CM | POA: Insufficient documentation

## 2011-04-15 DIAGNOSIS — M25519 Pain in unspecified shoulder: Secondary | ICD-10-CM | POA: Insufficient documentation

## 2011-04-15 DIAGNOSIS — S42213A Unspecified displaced fracture of surgical neck of unspecified humerus, initial encounter for closed fracture: Secondary | ICD-10-CM | POA: Insufficient documentation

## 2011-04-15 DIAGNOSIS — S42209A Unspecified fracture of upper end of unspecified humerus, initial encounter for closed fracture: Secondary | ICD-10-CM

## 2011-04-15 DIAGNOSIS — IMO0001 Reserved for inherently not codable concepts without codable children: Secondary | ICD-10-CM | POA: Insufficient documentation

## 2011-04-15 DIAGNOSIS — I059 Rheumatic mitral valve disease, unspecified: Secondary | ICD-10-CM | POA: Insufficient documentation

## 2011-04-15 DIAGNOSIS — M25559 Pain in unspecified hip: Secondary | ICD-10-CM | POA: Insufficient documentation

## 2011-04-15 MED ORDER — OXYCODONE-ACETAMINOPHEN 5-325 MG PO TABS: 1.0000 | ORAL_TABLET | ORAL | Status: AC | PRN

## 2011-04-15 MED ORDER — ONDANSETRON HCL 4 MG/2ML IJ SOLN
4.0000 mg | Freq: Once | INTRAMUSCULAR | Status: AC
  Administered 2011-04-15: 4 mg via INTRAVENOUS

## 2011-04-15 MED ORDER — ONDANSETRON HCL 4 MG/2ML IJ SOLN
INTRAMUSCULAR | Status: AC
  Administered 2011-04-15: 4 mg via INTRAVENOUS
  Filled 2011-04-15: qty 2

## 2011-04-15 MED ORDER — MORPHINE SULFATE 4 MG/ML IJ SOLN
INTRAMUSCULAR | Status: AC
  Filled 2011-04-15: qty 1

## 2011-04-15 MED ORDER — MORPHINE SULFATE 4 MG/ML IJ SOLN
4.0000 mg | Freq: Once | INTRAMUSCULAR | Status: AC
  Administered 2011-04-15: 4 mg via INTRAVENOUS
  Filled 2011-04-15: qty 1

## 2011-04-15 MED ORDER — MORPHINE SULFATE 4 MG/ML IJ SOLN
4.0000 mg | Freq: Once | INTRAMUSCULAR | Status: AC
  Administered 2011-04-15: 4 mg via INTRAVENOUS

## 2011-04-15 NOTE — ED Notes (Signed)
Patient states pain in right hip and shoulder rom intact in hip but unable to assess rom in shoulder due to pain. Rates pain 9/10. Denies numbness in fingers and toes

## 2011-04-15 NOTE — ED Provider Notes (Signed)
History     CSN: [id number]  Arrival date & time 04/15/11  4098   First MD Initiated Contact with Patient 04/15/11 0600      Chief Complaint  Patient presents with  . Fall     Patient is a 75 y.o. female presenting with fall. The history is provided by the patient.  Fall The accident occurred less than 1 hour ago. The point of impact was the right shoulder and right hip. The pain is present in the right hip and right shoulder. The pain is moderate. She was ambulatory at the scene. Pertinent negatives include no visual change, no abdominal pain, no headaches and no loss of consciousness. The symptoms are aggravated by activity.  Pt was walking downstairs, tripped, and fell onto right shoulder/hip No head injury and no LOC No abd pain/cp/sob/headache No focal motor deficit She reports long h/o back problems and has had multiple surgeries with some increased pain in her back No neck pain/injury  Past Medical History  Diagnosis Date  . Dyslipidemia   . Chest pain     Felt to be noncardiac in the past  . Statin intolerance     As of 2009, no further attempts to use statins  . Mitral valve prolapse     Mild mitral regurgitation echo, 2007  . Ejection fraction     EF 60%, echo, 2007, atrial septum bows left to right compatible with increased left atrial pressure  . Hiatal hernia   . Osteoarthritis   . GERD (gastroesophageal reflux disease)     Barrett's esophagus  . Colon polyp   . Fibromyalgia   . Sinusitis     Multiple sinus operations in the past  . Lumbar spine pain     Complex lumbar spine surgery at [facility], 2012, complicated, MRSA, much of the appliance is removed, patient on chronic antibiotics  . Mitral regurgitation     Mild, 2007,  /  moderate or severe echo, September, 2012, eccentric jet wrapping the left atrium, consider TEE for further assessment    Past Surgical History  Procedure Date  . Back surgery   . Abdominal hysterectomy   . Cholecystectomy   .  Hernia repair   . Nasal sinus surgery   . Foot surgery     No family history on file.  History  Substance Use Topics  . Smoking status: Never Smoker   . Smokeless tobacco: Never Used  . Alcohol Use: No    OB History    Grav Para Term Preterm Abortions TAB SAB Ect Mult Living                  Review of Systems  Gastrointestinal: Negative for abdominal pain.  Neurological: Negative for loss of consciousness and headaches.  All other systems reviewed and are negative.    Allergies  Nsaids  Home Medications   Current Outpatient Rx  Name Route Sig Dispense Refill  . TRILIPIX PO Oral Take by mouth.      Marland Kitchen ZETIA PO Oral Take by mouth.      . FLUOXETINE HCL 20 MG PO CAPS Oral Take 20 mg by mouth daily.     Marland Kitchen LIDODERM 5 % EX PTCH  as needed.    Marland Kitchen METOPROLOL TARTRATE 50 MG PO TABS  TAKE ONE-HALF (1/2) TABLET TWICE A DAY 90 tablet 3  . NEXIUM 40 MG PO CPDR Oral Take 40 mg by mouth daily before breakfast.     . PREMARIN 0.45 MG  PO TABS Oral Take 0.45 mg by mouth daily.     . SULFAMETHOXAZOLE-TMP DS 800-160 MG PO TABS Oral Take 1 tablet by mouth 2 (two) times daily.     Marland Kitchen SYNTHROID 50 MCG PO TABS  TAKE 1 TABLET BY MOUTH EVERY DAY 30 tablet 3    Dispense as written.  . TRAMADOL HCL 50 MG PO TABS Oral Take 50 mg by mouth every 6 (six) hours as needed.      . VOLTAREN 1 % TD GEL Topical Apply 1 application topically as needed.       BP 120/68  Pulse 61  Temp(Src) 97.8 F (36.6 C) (Oral)  SpO2 98%  Physical Exam CONSTITUTIONAL: Well developed/well nourished HEAD AND FACE: Normocephalic/atraumatic EYES: EOMI/PERRL ENMT: Mucous membranes moist NECK: supple no meningeal signs SPINE:Cspine nontender, NEXUS criteria met on initial exam  tender in lumbar region, T spine nontender No bruising/crepitance/stepoffs noted to spine CV: S1/S2 noted, no murmurs/rubs/gallops noted LUNGS: Lungs are clear to auscultation bilaterally, no apparent distress ABDOMEN: soft, nontender, no  rebound or guarding GU:no cva tenderness NEURO: Pt is awake/alert, moves all extremitiesx4, GCS 15 No focal motor deficits in the LE EXTREMITIES: pulses normal Tenderness to palpation of right shoulder but no obvious deformity/crepitance/bruising/laceration Tender to palpation of right hip but no tenderness to ROM of right hip All other extremities/joints palpated/ranged and nontender SKIN: warm, color normal PSYCH: no abnormalities of mood noted   ED Course  Procedures    6:48 AM Pt with mechanical fall, will get imaging and reassess  7:05 AM D/w dr Otelia Sergeant, he will see patient later today concerning humerus fracture   7:27 AM Pt updated on plan, comfortable with d/c home She has no other new complaints  MDM  Nursing notes reviewed and considered in documentation xrays reviewed and considered         Joya Gaskins, MD 04/15/11 [phone]

## 2011-04-15 NOTE — ED Notes (Signed)
Patient was going down the stairs and tripped falling down 3 carpeted stairs hurting right shoulder and right hip

## 2011-04-18 ENCOUNTER — Encounter (HOSPITAL_COMMUNITY): Admission: RE | Payer: Self-pay | Source: Ambulatory Visit

## 2011-04-18 ENCOUNTER — Ambulatory Visit (HOSPITAL_COMMUNITY): Admission: RE | Admit: 2011-04-18 | Payer: BC Managed Care – PPO | Source: Ambulatory Visit | Admitting: Specialist

## 2011-04-18 SURGERY — CLOSED REDUCTION, SHOULDER
Anesthesia: General | Laterality: Right

## 2011-04-24 DIAGNOSIS — I38 Endocarditis, valve unspecified: Secondary | ICD-10-CM | POA: Insufficient documentation

## 2011-08-13 ENCOUNTER — Encounter: Payer: Self-pay | Admitting: Family Medicine

## 2011-08-13 ENCOUNTER — Ambulatory Visit (INDEPENDENT_AMBULATORY_CARE_PROVIDER_SITE_OTHER): Payer: Medicare Other | Admitting: Family Medicine

## 2011-08-13 VITALS — BP 120/72 | HR 74 | Temp 98.2°F | Ht <= 58 in | Wt 146.8 lb

## 2011-08-13 DIAGNOSIS — M545 Low back pain, unspecified: Secondary | ICD-10-CM

## 2011-08-13 DIAGNOSIS — D649 Anemia, unspecified: Secondary | ICD-10-CM

## 2011-08-13 DIAGNOSIS — Z79890 Hormone replacement therapy (postmenopausal): Secondary | ICD-10-CM

## 2011-08-13 DIAGNOSIS — E039 Hypothyroidism, unspecified: Secondary | ICD-10-CM

## 2011-08-13 DIAGNOSIS — E7800 Pure hypercholesterolemia, unspecified: Secondary | ICD-10-CM

## 2011-08-13 DIAGNOSIS — K2270 Barrett's esophagus without dysplasia: Secondary | ICD-10-CM

## 2011-08-13 NOTE — Patient Instructions (Addendum)
Don't change your meds for now and schedule a physical with fasting labs ahead of time in 10/13.  Take care.

## 2011-08-16 ENCOUNTER — Other Ambulatory Visit (INDEPENDENT_AMBULATORY_CARE_PROVIDER_SITE_OTHER): Payer: Self-pay

## 2011-08-16 DIAGNOSIS — E041 Nontoxic single thyroid nodule: Secondary | ICD-10-CM

## 2011-08-19 DIAGNOSIS — E039 Hypothyroidism, unspecified: Secondary | ICD-10-CM | POA: Insufficient documentation

## 2011-08-19 NOTE — Assessment & Plan Note (Signed)
Continue as is, will await records from [facility].  She has f/u with [facility] later in 2013.

## 2011-08-19 NOTE — Progress Notes (Signed)
New pt to est care.    H/o scoliosis, with complicated surgical course, s/p hardware infection, removal, followed by ID at [facility] and on chronic abx.  No fevers.  Back pain at baseline.    Hypothyroid, compliant with meds, no neck mass, no ADE.   GERD/barrett's/HH, controlled with meds. compliant with meds, no ADE.   H/o HLD, statin intolerant.    PMH and SH reviewed  ROS: See HPI, otherwise noncontributory.  Meds, vitals, and allergies reviewed.   nad ncat Neck supple, no  TMG Mmm rrr ctab abd soft, not ttp Ext w/o edema

## 2011-08-19 NOTE — Assessment & Plan Note (Signed)
Will recheck later in 2013

## 2011-08-19 NOTE — Assessment & Plan Note (Signed)
Sx controlled, cont PPI.

## 2011-08-19 NOTE — Assessment & Plan Note (Signed)
She elects to continue as of 2013, is aware of risk/benefits.

## 2011-08-21 ENCOUNTER — Other Ambulatory Visit: Payer: Self-pay | Admitting: Dermatology

## 2011-09-10 ENCOUNTER — Ambulatory Visit
Admission: RE | Admit: 2011-09-10 | Discharge: 2011-09-10 | Disposition: A | Discharge status: Home / Self Care | Payer: Medicare Other | Source: Ambulatory Visit | Attending: Surgery | Admitting: Surgery

## 2011-09-10 ENCOUNTER — Other Ambulatory Visit (INDEPENDENT_AMBULATORY_CARE_PROVIDER_SITE_OTHER): Payer: Self-pay | Admitting: Surgery

## 2011-09-10 DIAGNOSIS — E041 Nontoxic single thyroid nodule: Secondary | ICD-10-CM

## 2011-09-11 LAB — TSH: TSH: 5.791 u[IU]/mL — ABNORMAL HIGH (ref 0.350–4.500)

## 2011-09-18 ENCOUNTER — Encounter (INDEPENDENT_AMBULATORY_CARE_PROVIDER_SITE_OTHER): Payer: Self-pay | Admitting: Surgery

## 2011-09-18 ENCOUNTER — Ambulatory Visit (INDEPENDENT_AMBULATORY_CARE_PROVIDER_SITE_OTHER): Payer: Medicare Other | Admitting: Surgery

## 2011-09-18 VITALS — BP 110/70 | HR 82 | Temp 97.9°F | Resp 14 | Ht <= 58 in | Wt 146.2 lb

## 2011-09-18 DIAGNOSIS — E039 Hypothyroidism, unspecified: Secondary | ICD-10-CM

## 2011-09-18 DIAGNOSIS — E041 Nontoxic single thyroid nodule: Secondary | ICD-10-CM

## 2011-09-18 MED ORDER — LEVOTHYROXINE SODIUM 25 MCG PO TABS: 50.0000 ug | ORAL_TABLET | Freq: Every day | ORAL | Status: DC

## 2011-09-18 NOTE — Patient Instructions (Signed)
Begin taking Synthroid 50 mcg daily.  Will check TSH in 6 weeks and call you if it is abnormal.  Velora Heckler, MD, [facility] Office: [phone]

## 2011-09-18 NOTE — Progress Notes (Signed)
Visit Diagnoses: 1. Thyroid nodule, uninodular   2. Hypothyroid     HISTORY: Patient is a 76 year old white female followed for many years in my practice for dominant thyroid nodule and hypothyroidism. At my request she underwent a followup thyroid ultrasound on Sep 10, 2011. This shows a normal sized gland containing a solitary right thyroid nodule measuring 1.8 cm in size. This is stable. Thyroid functions show an elevated TSH level of 5.79 one on her present dose of Synthroid 25 mcg daily.  PERTINENT REVIEW OF SYSTEMS: Denies tremor. Denies palpitations. Denies dysphagia. Denies any new masses. Has noted mild weight gain.  EXAM: HEENT: normocephalic; pupils equal and reactive; sclerae clear; dentition good; mucous membranes moist NECK:  Palpable nodule right thyroid lobe, approximately 2 cm, soft, mobile, nontender; asymmetric on extension; no palpable anterior or posterior cervical lymphadenopathy; no supraclavicular masses; no tenderness CHEST: clear to auscultation bilaterally without rales, rhonchi, or wheezes CARDIAC: regular rate and rhythm without significant murmur; peripheral pulses are full EXT:  non-tender without edema; no deformity NEURO: no gross focal deficits; no sign of tremor   IMPRESSION: #1 Solitary right thyroid nodule, 1.8 cm, clinically stable #2 Hypothyroidism  PLAN: The patient and I discussed the above findings.  I am going to increase her dose of thyroid hormone to 50 mcg daily. We will check a TSH level in 6 weeks. Patient will return in one year. Prior to that office visit we will obtain a thyroid ultrasound and a TSH level.  Velora Heckler, MD, FACS General & Endocrine Surgery [facility]

## 2011-09-26 ENCOUNTER — Telehealth: Payer: Self-pay | Admitting: Family Medicine

## 2011-09-26 NOTE — Telephone Encounter (Signed)
Faxed

## 2011-09-26 NOTE — Telephone Encounter (Signed)
Please fax a copy of the labs from last visit to Dr. Corliss Skains at fax [phone]

## 2011-10-02 ENCOUNTER — Other Ambulatory Visit: Payer: Self-pay | Admitting: Family Medicine

## 2011-10-02 DIAGNOSIS — Z1231 Encounter for screening mammogram for malignant neoplasm of breast: Secondary | ICD-10-CM

## 2011-10-31 ENCOUNTER — Other Ambulatory Visit (INDEPENDENT_AMBULATORY_CARE_PROVIDER_SITE_OTHER): Payer: Self-pay | Admitting: Surgery

## 2011-10-31 LAB — TSH: TSH: 1.987 u[IU]/mL (ref 0.350–4.500)

## 2011-11-01 ENCOUNTER — Ambulatory Visit (INDEPENDENT_AMBULATORY_CARE_PROVIDER_SITE_OTHER): Payer: Medicare Other | Admitting: Cardiology

## 2011-11-01 ENCOUNTER — Encounter: Payer: Self-pay | Admitting: Cardiology

## 2011-11-01 ENCOUNTER — Ambulatory Visit
Admission: RE | Admit: 2011-11-01 | Discharge: 2011-11-01 | Disposition: A | Discharge status: Home / Self Care | Payer: Medicare Other | Source: Ambulatory Visit | Attending: Family Medicine | Admitting: Family Medicine

## 2011-11-01 VITALS — BP 116/68 | HR 87 | Ht <= 58 in | Wt 144.0 lb

## 2011-11-01 DIAGNOSIS — R079 Chest pain, unspecified: Secondary | ICD-10-CM

## 2011-11-01 DIAGNOSIS — Z1231 Encounter for screening mammogram for malignant neoplasm of breast: Secondary | ICD-10-CM

## 2011-11-01 DIAGNOSIS — M545 Low back pain, unspecified: Secondary | ICD-10-CM

## 2011-11-01 DIAGNOSIS — I059 Rheumatic mitral valve disease, unspecified: Secondary | ICD-10-CM

## 2011-11-01 DIAGNOSIS — I340 Nonrheumatic mitral (valve) insufficiency: Secondary | ICD-10-CM

## 2011-11-01 MED ORDER — METOPROLOL TARTRATE 50 MG PO TABS: 25.0000 mg | ORAL_TABLET | Freq: Two times a day (BID) | ORAL | Status: DC

## 2011-11-01 NOTE — Assessment & Plan Note (Signed)
She's not having any significant chest pain. No further workup. 

## 2011-11-01 NOTE — Assessment & Plan Note (Signed)
The patient had very complex surgery for her spine in 2012. This was a long and drawn out and difficult time for her. She continues to improve.

## 2011-11-01 NOTE — Assessment & Plan Note (Signed)
I had considered doing a TEE. The patient now tells me she may have had one while at [facility]. Also there is a question that she may have had endocarditis. She is clinically stable. We will first obtain any discharged data and any echo data from [facility] during her stay there. I will then be in touch with her.

## 2011-11-01 NOTE — Patient Instructions (Addendum)
Your physician recommends that you schedule a follow-up appointment in: 3 months. Your physician recommends that you continue on your current medications as directed. Please refer to the Current Medication list given to you today. 

## 2011-11-01 NOTE — Progress Notes (Signed)
HPI  Patient is seen for cardiology followup and followup of her mitral regurgitation. I saw her last in December, 2012. She has mitral regurgitation. We had done an outpatient echo study in September, 2012. The data suggested moderate MR. However it was eccentric and it may be worse. Her ejection fraction was 60%. I mentioned consideration of the TEE at some time. Today she mentions that when she was at [facility] earlier in 2012, that she had had what sounds like a TEE. 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 There was mention that she may have had endocarditis. I did not have this data in the past.  The patient also brings in labs today that I have reviewed. These were done in September 30, 2011. Potassium 4.7 BUN 25 creatinine 0.95 hemoglobin 10.8.  Allergies  Allergen Reactions  . Nsaids     REACTION: No long term use  . Statins     Intolerant, myalgias  . Trilipix (Choline Fenofibrate)     GI upset    Current Outpatient Prescriptions  Medication Sig Dispense Refill  . ALPHA LIPOIC ACID PO Daily      . Biotin 10 MG TABS Daily      . calcium carbonate (OS-CAL) 600 MG TABS Take 1,200 mg by mouth 2 (two) times daily with a meal.      . cholecalciferol (VITAMIN D) 1000 UNITS tablet Take 1,000 Units by mouth daily.      . Ezetimibe (ZETIA PO) Take 10 mg by mouth daily.       . fish oil-omega-3 fatty acids 1000 MG capsule Take 2 g by mouth daily.      . Ginger, Zingiber officinalis, (GINGER PO) Take 1 tablet by mouth daily.      Marland Kitchen levothyroxine (SYNTHROID, LEVOTHROID) 25 MCG tablet Take 2 tablets (50 mcg total) by mouth daily.  30 tablet  12  . LIDODERM 5 % as needed.      . metoprolol (LOPRESSOR) 50 MG tablet TAKE ONE-HALF (1/2) TABLET TWICE A DAY  90 tablet  3  . NEXIUM 40 MG capsule Take 40 mg by mouth 2 (two) times daily.       . NON FORMULARY Tumeric      Daily      . PREMARIN 0.45 MG tablet Take 0.45 mg by mouth daily.       Marland Kitchen sulfamethoxazole-trimethoprim (BACTRIM DS) 800-160 MG per tablet  Take 1 tablet by mouth 2 (two) times daily.       . VOLTAREN 1 % GEL Apply 1 application topically as needed. pain        History   Social History  . Marital Status: Married    Spouse Name: N/A    Number of Children: N/A  . Years of Education: N/A   Occupational History  . Not on file.   Social History Main Topics  . Smoking status: Never Smoker   . Smokeless tobacco: Never Used  . Alcohol Use: No  . Drug Use: No  . Sexually Active: Not on file   Other Topics Concern  . 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 GER disease Mother   . Dementia Mother   . Breast cancer Mother   . Heart disease Father     MI at 22  . Colon cancer Neg Hx  Past Medical History  Diagnosis Date  . Dyslipidemia   . Chest pain     Felt to be noncardiac in the past  . Statin intolerance     As of 2009, no further attempts to use statins  . Mitral valve prolapse     Mild mitral regurgitation echo, 2007  . Ejection fraction     EF 60%, echo, 2007, atrial septum bows left to right compatible with increased left atrial pressure  . Hiatal hernia   . Osteoarthritis   . GERD (gastroesophageal reflux disease)     Barrett's esophagus  . Colon polyp   . Fibromyalgia   . Sinusitis     Multiple sinus operations in the past  . Lumbar spine pain     Complex lumbar spine surgery at [facility], 2012, complicated, MRSA, much of the appliance is removed, patient on chronic antibiotics  . Mitral regurgitation     Mild, 2007,  /  moderate or severe echo, September, 2012, eccentric jet wrapping the left atrium, consider TEE for further assessment  . Scoliosis   . Anxiety     prev on prozac  . Melanoma   . Thyroid nodule     Past Surgical History  Procedure Date  . Back surgery   . Abdominal hysterectomy   . Cholecystectomy   . Hernia repair   .  Nasal sinus surgery   . Foot surgery     ROS   Patient denies fever, chills, headache, sweats, rash, change in vision, change in hearing, chest pain, cough, nausea vomiting, urinary symptoms. All other systems are reviewed and are negative.  PHYSICAL EXAM  Patient is oriented to person time and place. Affect is normal. There is no jugulovenous distention. Lungs are clear. Respiratory effort is nonlabored. Cardiac exam reveals S1 and S2. I do not hear significant mitral regurgitation at this time. The abdomen is soft. There is no peripheral edema.  Filed Vitals:   11/01/11 1448  BP: 116/68  Pulse: 87  Height: 4\' 9"  (1.448 m)  Weight: 144 lb (65.318 kg)   EKG is done today and reviewed by me. There is relatively low voltage. This has not changed. There is some decreased R wave progression but she has had this in the past. There is no significant change. ASSESSMENT & PLAN

## 2011-11-04 ENCOUNTER — Encounter: Payer: Self-pay | Admitting: Cardiology

## 2011-11-04 ENCOUNTER — Encounter: Payer: Self-pay | Admitting: *Deleted

## 2011-11-07 ENCOUNTER — Telehealth (INDEPENDENT_AMBULATORY_CARE_PROVIDER_SITE_OTHER): Payer: Self-pay

## 2011-11-07 NOTE — Telephone Encounter (Signed)
Per Dr Ardine Eng request pt advised of lab result and continue synthroid . Pt advised to keep routine f/u.

## 2011-11-13 ENCOUNTER — Telehealth: Payer: Self-pay

## 2011-11-13 NOTE — Telephone Encounter (Signed)
Signed.

## 2011-11-13 NOTE — Telephone Encounter (Signed)
Patient notified as instructed by telephone. Handicap form at front desk for pick up. Form sent for scanning.

## 2011-11-13 NOTE — Telephone Encounter (Signed)
Pt request handicap placard signed; form is in Dr Lianne Bushy in box. Pt going out of town tomorrow for 16 days and request pick up signed form today before 4 pm. Pt has hx of MRSA, 5 back surgeries,arthritis, and cannot walk more than 200 feet  Without stopping to rest. Pt uses walker when needed. Pt wants 2 placard forms signed.Please advise.

## 2012-01-20 ENCOUNTER — Other Ambulatory Visit: Payer: Medicare Other

## 2012-01-24 ENCOUNTER — Encounter: Payer: Medicare Other | Admitting: Family Medicine

## 2012-01-24 DIAGNOSIS — Z0289 Encounter for other administrative examinations: Secondary | ICD-10-CM

## 2012-01-27 ENCOUNTER — Telehealth: Payer: Self-pay | Admitting: Family Medicine

## 2012-01-27 NOTE — Telephone Encounter (Signed)
Patient had appointment for a cpx on 01/24/12.  Patient said she didn't know about the appointment until she came back from the beach and heard the reminder call.  She said she's sorry.  She rescheduled her appointment to 04/20/12.

## 2012-01-27 NOTE — Telephone Encounter (Signed)
Fine, make sure she wasn't charged.

## 2012-02-03 ENCOUNTER — Ambulatory Visit (INDEPENDENT_AMBULATORY_CARE_PROVIDER_SITE_OTHER): Payer: Medicare Other

## 2012-02-03 DIAGNOSIS — Z23 Encounter for immunization: Secondary | ICD-10-CM

## 2012-02-04 ENCOUNTER — Ambulatory Visit: Payer: Medicare Other | Admitting: Cardiology

## 2012-02-17 ENCOUNTER — Encounter: Payer: Self-pay | Admitting: Cardiology

## 2012-02-17 ENCOUNTER — Ambulatory Visit (INDEPENDENT_AMBULATORY_CARE_PROVIDER_SITE_OTHER): Payer: Medicare Other | Admitting: Cardiology

## 2012-02-17 VITALS — BP 98/72 | HR 74 | Ht <= 58 in | Wt 145.4 lb

## 2012-02-17 DIAGNOSIS — Z789 Other specified health status: Secondary | ICD-10-CM

## 2012-02-17 DIAGNOSIS — R079 Chest pain, unspecified: Secondary | ICD-10-CM

## 2012-02-17 DIAGNOSIS — I059 Rheumatic mitral valve disease, unspecified: Secondary | ICD-10-CM

## 2012-02-17 DIAGNOSIS — Z888 Allergy status to other drugs, medicaments and biological substances status: Secondary | ICD-10-CM

## 2012-02-17 DIAGNOSIS — I340 Nonrheumatic mitral (valve) insufficiency: Secondary | ICD-10-CM

## 2012-02-17 NOTE — Assessment & Plan Note (Signed)
Patient cannot tolerate statins. No change in therapy.

## 2012-02-17 NOTE — Assessment & Plan Note (Signed)
I need to continue to assess her mitral regurgitation. I need to push to try to obtain the TEE data from [facility] from December, 2012. Regardless of this information we need followup standard echo information at this time and this will be scheduled.

## 2012-02-17 NOTE — Patient Instructions (Addendum)
Your physician wants you to follow-up in: 6 monhts.  You will receive a reminder letter in the mail two months in advance. If you don't receive a letter, please call our office to schedule the follow-up appointment.  Your physician has requested that you have an echocardiogram. Echocardiography is a painless test that uses sound waves to create images of your heart. It provides your doctor with information about the size and shape of your heart and how well your heart's chambers and valves are working. This procedure takes approximately one hour. There are no restrictions for this procedure.  Please sign a release so that  [facility] will send [place] a copy of your transesophageal echocardiogram

## 2012-02-17 NOTE — Assessment & Plan Note (Signed)
Patient is not having any recurrent chest pain. No further workup. 

## 2012-02-17 NOTE — Progress Notes (Signed)
HPI  Patient is seen to followup mitral regurgitation. I saw her last July, 2013. We know that she had had an echo in September, 2012. Date mitral regurg jet was eccentric. He was possible that was wrapping the atrium. I considered a TEE. However she mentioned that she thought she had had one at [facility] in December, 2012. We tried to obtain this information. I have reviewed my electronic medical record and so far I have not been able to find any reports from [place]. I will request this again today.  Allergies  Allergen Reactions  . Nsaids     REACTION: No long term use  . Statins     Intolerant, myalgias  . Trilipix (Choline Fenofibrate)     GI upset    Current Outpatient Prescriptions  Medication Sig Dispense Refill  . ALPHA LIPOIC ACID PO Daily      . Biotin 10 MG TABS Daily      . calcium carbonate (OS-CAL) 600 MG TABS Take 1,200 mg by mouth 2 (two) times daily with a meal.      . cholecalciferol (VITAMIN D) 1000 UNITS tablet Take 1,000 Units by mouth daily.      . Ezetimibe (ZETIA PO) Take 10 mg by mouth daily.       . fish oil-omega-3 fatty acids 1000 MG capsule Take 2 g by mouth daily.      Marland Kitchen gabapentin (NEURONTIN) 100 MG capsule Take 100 mg by mouth at bedtime.       . Ginger, Zingiber officinalis, (GINGER PO) Take 1 tablet by mouth daily.      Marland Kitchen levothyroxine (SYNTHROID, LEVOTHROID) 25 MCG tablet Take 2 tablets (50 mcg total) by mouth daily.  30 tablet  12  . LIDODERM 5 % as needed.      . metoprolol (LOPRESSOR) 50 MG tablet Take 0.5 tablets (25 mg total) by mouth 2 (two) times daily.  90 tablet  3  . NEXIUM 40 MG capsule Take 40 mg by mouth 2 (two) times daily.       . NON FORMULARY Tumeric      Daily        . PREMARIN 0.45 MG tablet Take 0.45 mg by mouth daily.       Marland Kitchen sulfamethoxazole-trimethoprim (BACTRIM DS) 800-160 MG per tablet Take 1 tablet by mouth 2 (two) times daily.       . VOLTAREN 1 % GEL Apply 1 application topically as needed. pain        History   Social  History  . Marital Status: Married    Spouse Name: N/A    Number of Children: N/A  . Years of Education: N/A   Occupational History  . Not on file.   Social History Main Topics  . Smoking status: Never Smoker   . Smokeless tobacco: Never Used  . Alcohol Use: No  . Drug Use: No  . Sexually Active: Not on file   Other Topics Concern  . 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 GER disease Mother   . Dementia Mother   . Breast cancer Mother   . Heart disease Father     MI at 16  . Colon cancer Neg Hx     Past Medical History  Diagnosis Date  . Dyslipidemia   . Chest pain  Felt to be noncardiac in the past  . Statin intolerance     As of 2009, no further attempts to use statins  . Mitral valve prolapse     Mild mitral regurgitation echo, 2007  . Ejection fraction     EF 60%, echo, 2007, atrial septum bows left to right compatible with increased left atrial pressure  . Hiatal hernia   . Osteoarthritis   . GERD (gastroesophageal reflux disease)     Barrett's esophagus  . Colon polyp   . Fibromyalgia   . Sinusitis     Multiple sinus operations in the past  . Lumbar spine pain     Complex lumbar spine surgery at [facility], 2012, complicated, MRSA, much of the appliance is removed, patient on chronic antibiotics  . Mitral regurgitation     Mild, 2007,  /  moderate or severe echo, September, 2012, eccentric jet wrapping the left atrium, consider TEE for further assessment  . Scoliosis   . Anxiety     prev on prozac  . Melanoma   . Thyroid nodule     Past Surgical History  Procedure Date  . Back surgery   . Abdominal hysterectomy   . Cholecystectomy   . Hernia repair   . Nasal sinus surgery   . Foot surgery     Patient Active Problem List  Diagnosis  . ADENOMATOUS COLONIC POLYP  . GERD  . BARRETTS ESOPHAGUS    . HIATAL HERNIA  . OSTEOARTHRITIS, GENERALIZED  . FIBROMYALGIA  . CATARACT EXTRACTION, HX OF  . Dyslipidemia  . Chest pain  . Statin intolerance  . Mitral valve prolapse  . Ejection fraction  . Fibromyalgia  . Lumbar spine pain  . Mitral regurgitation  . Postmenopausal HRT (hormone replacement therapy)  . Hypothyroid  . Thyroid nodule, uninodular    ROS   Patient denies fever, chills, headache, sweats, rash, change in vision, change in hearing, chest pain, cough, nausea vomiting, urinary symptoms. All other systems are reviewed and are negative.  PHYSICAL EXAM  Patient is oriented to person time and place. Affect is normal. There is no jugulovenous distention. Lungs are clear. Respiratory effort is nonlabored. Cardiac exam reveals S1 and S2. There no clicks or significant murmurs. The abdomen is soft. There is no peripheral edema.  Filed Vitals:   02/17/12 1054  BP: 98/72  Pulse: 74  Height: 4\' 9"  (1.448 m)  Weight: 145 lb 6.4 oz (65.953 kg)  SpO2: 94%     ASSESSMENT & PLAN

## 2012-02-21 ENCOUNTER — Ambulatory Visit (HOSPITAL_COMMUNITY): Payer: Medicare Other | Attending: Cardiology

## 2012-02-21 DIAGNOSIS — I369 Nonrheumatic tricuspid valve disorder, unspecified: Secondary | ICD-10-CM | POA: Insufficient documentation

## 2012-02-21 DIAGNOSIS — I340 Nonrheumatic mitral (valve) insufficiency: Secondary | ICD-10-CM

## 2012-02-21 DIAGNOSIS — I080 Rheumatic disorders of both mitral and aortic valves: Secondary | ICD-10-CM | POA: Insufficient documentation

## 2012-02-21 DIAGNOSIS — I059 Rheumatic mitral valve disease, unspecified: Secondary | ICD-10-CM

## 2012-02-21 NOTE — Progress Notes (Signed)
Echocardiogram performed.  

## 2012-02-24 ENCOUNTER — Encounter: Payer: Self-pay | Admitting: Cardiology

## 2012-03-02 ENCOUNTER — Other Ambulatory Visit: Payer: Self-pay | Admitting: *Deleted

## 2012-03-02 MED ORDER — ESOMEPRAZOLE MAGNESIUM 40 MG PO CPDR: 40.0000 mg | DELAYED_RELEASE_CAPSULE | Freq: Two times a day (BID) | ORAL | Status: DC

## 2012-03-09 ENCOUNTER — Encounter: Payer: Self-pay | Admitting: Cardiology

## 2012-03-09 NOTE — Progress Notes (Signed)
   I have been following the patient for her mitral valve disease. When I saw her recently in the office I made plans for followup echo. This has been done. There was only mild mitral regurgitation.  I have received records from [facility] and I have reviewed them carefully. There is a TEE report dated March 12, 2010. In the conclusions the report says vegetation involving P1 mitral scallop. There was moderate mitral regurgitation with 2 jets. There was mild aortic insufficiency. LV function was thought to be normal on a limited basis.  There is another note dated October 24, 2011.  In this note there is review of the history from November, 2011.   It states that there was lumbar stenosis that had T12-S1 anterior interbody fusion and L4-L5 instrumentation done on January 31, 2010. This was complicated by MRSA bacteremia and hardware infection that required operative drainage of a retroperitoneal abscess on March 12, 2010. The note goes on to say that a TEE was performed March 12, 2010 and was negative for valvular vegetations. It outlines that vancomycin was used. She had to be admitted back to the hospital on April 09, 2010 and went to the operating room for incision and drainage and removal of the S1 screws which had been loosening. She was discharged on vancomycin and received 6 weeks of IV antibiotics.  She was then placed on Bactrim twice daily for her in definite suppression.  As I have outlined there is disagreement between an actual TEE report from March 12, 2010 stating that there was a vegetation on P1. This differs from the summary note of October 26, 2011 stating that no vegetation was seen. However this is a mute point as the patient did receive 6 weeks of IV antibiotics with vancomycin. She continues on in definite Bactrim suppression.  Fortunately we now know that she has only mild mitral regurgitation by the most recent echo.

## 2012-03-17 ENCOUNTER — Telehealth (INDEPENDENT_AMBULATORY_CARE_PROVIDER_SITE_OTHER): Payer: Self-pay

## 2012-03-17 ENCOUNTER — Other Ambulatory Visit: Payer: Self-pay | Admitting: Family Medicine

## 2012-03-17 NOTE — Telephone Encounter (Signed)
Refill levothyroxine authorized x 6 refills [facility]. Confirmation received.

## 2012-04-16 ENCOUNTER — Other Ambulatory Visit: Payer: Medicare Other

## 2012-04-20 ENCOUNTER — Encounter: Payer: Medicare Other | Admitting: Family Medicine

## 2012-07-10 ENCOUNTER — Telehealth (INDEPENDENT_AMBULATORY_CARE_PROVIDER_SITE_OTHER): Payer: Self-pay

## 2012-07-10 NOTE — Telephone Encounter (Signed)
Refill request for synthroid #30, 2 tablets daily to equal received from [facility]. Per epic note from Dec 2013, pt was to have refill x 6 mo. [facility] advised pt can have two more refills.

## 2012-07-13 ENCOUNTER — Telehealth (INDEPENDENT_AMBULATORY_CARE_PROVIDER_SITE_OTHER): Payer: Self-pay | Admitting: *Deleted

## 2012-07-13 ENCOUNTER — Other Ambulatory Visit (INDEPENDENT_AMBULATORY_CARE_PROVIDER_SITE_OTHER): Payer: Self-pay | Admitting: *Deleted

## 2012-07-13 DIAGNOSIS — E041 Nontoxic single thyroid nodule: Secondary | ICD-10-CM

## 2012-07-13 MED ORDER — LEVOTHYROXINE SODIUM 25 MCG PO TABS: 50.0000 ug | ORAL_TABLET | Freq: Every day | ORAL | Status: DC

## 2012-07-13 NOTE — Telephone Encounter (Signed)
Patient called to state she is out of her Levothyroxine and is leaving to go out of town for the next month.  Prescription e-scribed to refill at this time. Disp number increased to 60 tablets since Gerkin MD changed to 2 tablets per day.

## 2012-08-31 ENCOUNTER — Other Ambulatory Visit: Payer: Self-pay | Admitting: Family Medicine

## 2012-09-01 NOTE — Telephone Encounter (Signed)
Sent, schedule CPE.  

## 2012-09-01 NOTE — Telephone Encounter (Signed)
Electronic refill request. Not seen in our office in > 1 year.  Please advise.

## 2012-09-02 NOTE — Telephone Encounter (Signed)
Patient notified that she needs to schedule a physical for further refills. Was advised by patient that she will have to check her schedule and call back to schedule the appointment.

## 2012-09-11 ENCOUNTER — Encounter: Payer: Self-pay | Admitting: Cardiology

## 2012-09-11 ENCOUNTER — Ambulatory Visit (INDEPENDENT_AMBULATORY_CARE_PROVIDER_SITE_OTHER): Payer: Medicare Other | Admitting: Cardiology

## 2012-09-11 VITALS — BP 103/65 | HR 80 | Ht <= 58 in | Wt 144.8 lb

## 2012-09-11 DIAGNOSIS — R079 Chest pain, unspecified: Secondary | ICD-10-CM

## 2012-09-11 DIAGNOSIS — I340 Nonrheumatic mitral (valve) insufficiency: Secondary | ICD-10-CM

## 2012-09-11 DIAGNOSIS — I059 Rheumatic mitral valve disease, unspecified: Secondary | ICD-10-CM

## 2012-09-11 MED ORDER — METOPROLOL TARTRATE 50 MG PO TABS: 25.0000 mg | ORAL_TABLET | Freq: Two times a day (BID) | ORAL | Status: DC

## 2012-09-11 NOTE — Assessment & Plan Note (Signed)
The patient is not having any significant chest pain.

## 2012-09-11 NOTE — Assessment & Plan Note (Signed)
Mitral regurgitation was only mild on recent echo in November, 2013. I personally reviewed this study. She is stable. No further workup of her mitral valve at this time.

## 2012-09-11 NOTE — Patient Instructions (Addendum)
**Note De-identified [name] [name] Obfuscation** Your physician recommends that you continue on your current medications as directed. Please refer to the Current Medication list given to you today.  Your physician wants you to follow-up in: 1 year. You will receive a reminder letter in the mail two months in advance. If you don't receive a letter, please call our office to schedule the follow-up appointment.  

## 2012-09-11 NOTE — Progress Notes (Signed)
[name] [name]   HPI  Patient is seen to followup mitral valve disease. I saw her last February 17, 2012. On November 8 she had a followup 2-D echo showing only mild mitral regurgitation. I then reviewed all the information received from [facility]. My review is included in a documentation note dated March 09, 2012. There was disagreement between their actual TEE report in November, 2011 and a summary note in the records from July, 2013. Regardless this is a mute point because the patient Received 6 weeks of IV antibiotics with vancomycin. She continues on Bactrim suppression. Fortunately her most recent echo shows only mild mitral regurgitation. I explained all this to her today.  Allergies  Allergen Reactions  . Nsaids     REACTION: No long term use  . Statins     Intolerant, myalgias  . Trilipix (Choline Fenofibrate)     GI upset    Current Outpatient Prescriptions  Medication Sig Dispense Refill  . ALPHA LIPOIC ACID PO Daily      . Biotin 10 MG TABS Daily      . calcium carbonate (OS-CAL) 600 MG TABS Take 1,200 mg by mouth 2 (two) times daily with a meal.      . cholecalciferol (VITAMIN D) 1000 UNITS tablet Take 1,000 Units by mouth daily.      . fish oil-omega-3 fatty acids 1000 MG capsule Take 2 g by mouth daily.      [name] [name] gabapentin (NEURONTIN) 100 MG capsule Take 100 mg by mouth at bedtime.       . Ginger, Zingiber officinalis, (GINGER PO) Take 1 tablet by mouth daily.      [name] [name] levothyroxine (SYNTHROID, LEVOTHROID) 25 MCG tablet Take 2 tablets (50 mcg total) by mouth daily.  60 tablet  12  . LIDODERM 5 % as needed.      . metoprolol (LOPRESSOR) 50 MG tablet Take 0.5 tablets (25 mg total) by mouth 2 (two) times daily.  90 tablet  3  . NEXIUM 40 MG capsule TAKE 1 CAPSULE (40 MG TOTAL) BY MOUTH 2 (TWO) TIMES DAILY.  60 capsule  1  . NON FORMULARY Tumeric      Daily        . PREMARIN 0.45 MG tablet Take 0.45 mg by mouth daily.       [name] [name] sulfamethoxazole-trimethoprim (BACTRIM DS) 800-160 MG per tablet  Take 1 tablet by mouth 2 (two) times daily.       . VOLTAREN 1 % GEL Apply 1 application topically as needed. pain      . ZETIA 10 MG tablet TAKE 1 TABLET BY MOUTH AT BEDTIME  30 tablet  5   No current facility-administered medications for this visit.    History   Social History  . Marital Status: Married    Spouse Name: N/A    Number of Children: N/A  . Years of Education: N/A   Occupational History  . Not on file.   Social History Main Topics  . Smoking status: Never Smoker   . Smokeless tobacco: Never Used  . Alcohol Use: No  . Drug Use: No  . Sexually Active: Not on file   Other Topics Concern  . Not on file   Social History Narrative   From [place]   Married (second [phone]   3 kids, 2 of whom are local   Retired, Tree surgeon    Family History  Problem Relation Age of Onset  . GER disease Mother   .  Dementia Mother   . Breast cancer Mother   . Heart disease Father     MI at 1  . Colon cancer Neg Hx     Past Medical History  Diagnosis Date  . Dyslipidemia   . Chest pain     Felt to be noncardiac in the past  . Statin intolerance     As of 2009, no further attempts to use statins  . Mitral valve prolapse     Mild mitral regurgitation echo, 2007  . Ejection fraction     EF 60%, echo, 2007, atrial septum bows left to right compatible with increased left atrial pressure  . Hiatal hernia   . Osteoarthritis   . GERD (gastroesophageal reflux disease)     Barrett's esophagus  . Colon polyp   . Fibromyalgia   . Sinusitis     Multiple sinus operations in the past  . Lumbar spine pain     Complex lumbar spine surgery at [facility], 2012, complicated, MRSA, much of the appliance is removed, patient on chronic antibiotics  . Mitral regurgitation     Mild, 2007,  /  moderate or severe echo, September, 2012, eccentric jet wrapping the left atrium, consider TEE for further assessment  . Scoliosis   . Anxiety     prev on prozac  . Melanoma   .  Thyroid nodule     Past Surgical History  Procedure Laterality Date  . Back surgery    . Abdominal hysterectomy    . Cholecystectomy    . Hernia repair    . Nasal sinus surgery    . Foot surgery      Patient Active Problem List   Diagnosis Date Noted  . Mitral regurgitation     Priority: High  . Dyslipidemia     Priority: High  . Chest pain     Priority: High  . Statin intolerance     Priority: High  . Mitral valve prolapse     Priority: High  . Ejection fraction     Priority: High  . Fibromyalgia     Priority: High  . Thyroid nodule, uninodular 09/18/2011  . Hypothyroid 08/19/2011  . Postmenopausal HRT (hormone replacement therapy) 08/13/2011  . Lumbar spine pain   . FIBROMYALGIA 12/31/2008  . ADENOMATOUS COLONIC POLYP 08/15/2007  . GERD 08/15/2007  . BARRETTS ESOPHAGUS 08/15/2007  . HIATAL HERNIA 08/15/2007  . OSTEOARTHRITIS, GENERALIZED 08/15/2007  . CATARACT EXTRACTION, HX OF 08/15/2007    ROS   Patient denies fever, chills, headache, sweats, rash, change in vision, change in hearing, chest pain, cough, nausea vomiting, urinary symptoms. She still has some back discomfort but fortunately this is improved after her multiple operations. All other systems are reviewed and are negative.  PHYSICAL EXAM  Patient is oriented to person time and place. Affect is normal. She is overweight. Lungs are clear. Respiratory effort is nonlabored. Cardiac exam reveals S1 and S2. There is a soft murmur of mitral regurgitation in the left lateral position. Abdomen is soft. There is no peripheral edema.  Filed Vitals:   09/11/12 1516  BP: 103/65  Pulse: 80  Height: 4\' 9"  (1.448 m)  Weight: 144 lb 12.8 oz (65.681 kg)  SpO2: 95%     ASSESSMENT & PLAN

## 2012-10-06 ENCOUNTER — Other Ambulatory Visit: Payer: Self-pay

## 2012-10-06 DIAGNOSIS — Z1231 Encounter for screening mammogram for malignant neoplasm of breast: Secondary | ICD-10-CM

## 2012-11-03 ENCOUNTER — Ambulatory Visit
Admission: RE | Admit: 2012-11-03 | Discharge: 2012-11-03 | Disposition: A | Discharge status: Home / Self Care | Payer: Medicare Other | Source: Ambulatory Visit

## 2012-11-03 DIAGNOSIS — Z1231 Encounter for screening mammogram for malignant neoplasm of breast: Secondary | ICD-10-CM

## 2013-01-28 ENCOUNTER — Other Ambulatory Visit: Payer: Self-pay | Admitting: Dermatology

## 2013-06-21 ENCOUNTER — Telehealth: Payer: Self-pay | Admitting: Cardiology

## 2013-06-21 NOTE — Telephone Encounter (Signed)
New Message  Pt called states that she is experiencing chest pains.. Requests a same day appt// Please assist

## 2013-06-21 NOTE — Telephone Encounter (Signed)
Pt has 1 year follow up appointment in June with Dr. Ron Parker. Requests sooner appointment. Going out of town for one month in April. For past month has had episodes of chest pain several times per week, occuring mostly at meat times (during eating) -lasting several minutes, no SOB, nausea or diaphoresis. May radiate to left arm but she is unsure due to neck/shoulder injury which is often painful. Works out three times per week.  This pain has not occurred with work outs. Taking Nexium 20 mg daily.  (in past had been taking 40 mg daily and occasionally bid) Spoke with Dr. Kae Heller primary nurse, and scheduled a work in appointment for 3/18.

## 2013-06-30 ENCOUNTER — Encounter: Payer: Self-pay | Admitting: Cardiology

## 2013-06-30 ENCOUNTER — Ambulatory Visit (INDEPENDENT_AMBULATORY_CARE_PROVIDER_SITE_OTHER): Payer: Medicare Other | Admitting: Cardiology

## 2013-06-30 VITALS — BP 122/68 | HR 76 | Ht <= 58 in | Wt 145.0 lb

## 2013-06-30 DIAGNOSIS — R079 Chest pain, unspecified: Secondary | ICD-10-CM

## 2013-06-30 DIAGNOSIS — I059 Rheumatic mitral valve disease, unspecified: Secondary | ICD-10-CM

## 2013-06-30 DIAGNOSIS — I340 Nonrheumatic mitral (valve) insufficiency: Secondary | ICD-10-CM

## 2013-06-30 NOTE — Patient Instructions (Signed)
Your physician recommends that you continue on your current medications as directed. Please refer to the Current Medication list given to you today.  Your physician recommends that you schedule a follow-up appointment in: on June 3 as already arranged

## 2013-06-30 NOTE — Assessment & Plan Note (Signed)
At this time her mitral valve appears to be stable by physical exam. When I see her back we will discuss whether it would be prudent to do a followup echo based on her complicated history in the past.

## 2013-06-30 NOTE — Progress Notes (Signed)
HPI  Patient is seen today. She's having some very localized left anterior chest discomfort. It is random. She notices it sometimes when eating, but says that it is different than her reflux symptoms. She might notice it when walking also. However it is not clearly exertional. There is no radiation. There is no nausea vomiting diaphoresis or shortness of breath.  Over time we have followed her carefully concerning her mitral valve. At one point she received 6 weeks of IV antibiotics with vancomycin. She also received Bactrim for suppression. These meds have been used a relative to very complex problem she had with spine surgery. Her most recent echo had shown only mild mitral regurgitation. I had reviewed this very carefully in the past.  Allergies  Allergen Reactions  . Nsaids     REACTION: No long term use  . Statins     Intolerant, myalgias  . Trilipix [Choline Fenofibrate]     GI upset    Current Outpatient Prescriptions  Medication Sig Dispense Refill  . ALPHA LIPOIC ACID PO Daily      . Biotin 10 MG TABS Daily      . calcium carbonate (OS-CAL) 600 MG TABS Take 1,200 mg by mouth 2 (two) times daily with a meal.      . cholecalciferol (VITAMIN D) 1000 UNITS tablet Take 1,000 Units by mouth daily.      Marland Kitchen esomeprazole (NEXIUM) 20 MG capsule Take 20 mg by mouth daily at 12 noon.      . fish oil-omega-3 fatty acids 1000 MG capsule Take 2 g by mouth daily.      Marland Kitchen gabapentin (NEURONTIN) 100 MG capsule Take 100 mg by mouth at bedtime.       . Ginger, Zingiber officinalis, (GINGER PO) Take 1 tablet by mouth daily.      Marland Kitchen levothyroxine (SYNTHROID, LEVOTHROID) 25 MCG tablet Take 2 tablets (50 mcg total) by mouth daily.  60 tablet  12  . LIDODERM 5 % as needed.      . metoprolol (LOPRESSOR) 50 MG tablet Take 0.5 tablets (25 mg total) by mouth 2 (two) times daily.  90 tablet  3  . NON FORMULARY Tumeric      Daily        . PREMARIN 0.45 MG tablet Take 0.45 mg by mouth daily.       Marland Kitchen  sulfamethoxazole-trimethoprim (BACTRIM DS) 800-160 MG per tablet Take 1 tablet by mouth 2 (two) times daily.       . VOLTAREN 1 % GEL Apply 1 application topically as needed. pain       No current facility-administered medications for this visit.    History   Social History  . Marital Status: Married    Spouse Name: N/A    Number of Children: N/A  . Years of Education: N/A   Occupational History  . Not on file.   Social History Main Topics  . Smoking status: Never Smoker   . Smokeless tobacco: Never Used  . Alcohol Use: No  . Drug Use: No  . Sexual Activity: Not on file   Other Topics Concern  . Not on file   Social History Narrative   From [place]   Married (second [phone]   3 kids, 2 of whom are local   Retired, Camera operator    Family History  Problem Relation Age of Onset  . GER disease Mother   . Dementia Mother   . Breast cancer Mother   .  Heart disease Father     MI at 15  . Colon cancer Neg Hx     Past Medical History  Diagnosis Date  . Dyslipidemia   . Chest pain     Felt to be noncardiac in the past  . Statin intolerance     As of 2009, no further attempts to use statins  . Mitral valve prolapse     Mild mitral regurgitation echo, 2007  . Ejection fraction     EF 60%, echo, 2007, atrial septum bows left to right compatible with increased left atrial pressure  . Hiatal hernia   . Osteoarthritis   . GERD (gastroesophageal reflux disease)     Barrett's esophagus  . Colon polyp   . Fibromyalgia   . Sinusitis     Multiple sinus operations in the past  . Lumbar spine pain     Complex lumbar spine surgery at [facility], 6144, complicated, MRSA, much of the appliance is removed, patient on chronic antibiotics  . Mitral regurgitation     Mild, 2007,  /  moderate or severe echo, September, 2012, eccentric jet wrapping the left atrium, consider TEE for further assessment  . Scoliosis   . Anxiety     prev on prozac  . Melanoma   . Thyroid  nodule     Past Surgical History  Procedure Laterality Date  . Back surgery    . Abdominal hysterectomy    . Cholecystectomy    . Hernia repair    . Nasal sinus surgery    . Foot surgery      Patient Active Problem List   Diagnosis Date Noted  . Mitral regurgitation     Priority: High  . Dyslipidemia     Priority: High  . Chest pain     Priority: High  . Statin intolerance     Priority: High  . Mitral valve prolapse     Priority: High  . Ejection fraction     Priority: High  . Fibromyalgia     Priority: High  . Thyroid nodule, uninodular 09/18/2011  . Hypothyroid 08/19/2011  . Postmenopausal HRT (hormone replacement therapy) 08/13/2011  . Lumbar spine pain   . FIBROMYALGIA 12/31/2008  . ADENOMATOUS COLONIC POLYP 08/15/2007  . GERD 08/15/2007  . BARRETTS ESOPHAGUS 08/15/2007  . HIATAL HERNIA 08/15/2007  . OSTEOARTHRITIS, GENERALIZED 08/15/2007  . CATARACT EXTRACTION, HX OF 08/15/2007    ROS   Patient denies fever, chills, headache, sweats, rash, change in vision, change in hearing, cough, nausea or vomiting, urinary symptoms. All other systems are reviewed and are negative.  PHYSICAL EXAM  Patient is oriented to person time and place. Affect is normal. There is no jugulovenous distention. Lungs are clear. Respiratory effort is nonlabored. There is very mild discomfort to palpation of the area of question over her left anterior chest. Cardiac exam reveals S1 and S2. I do not hear significant murmur. The abdomen is soft. There is no peripheral edema. There no musculoskeletal deformities. There are no skin rashes.  Filed Vitals:   06/30/13 0919  BP: 122/68  Pulse: 76  Height: 4\' 9"  (1.448 m)  Weight: 145 lb (65.772 kg)   EKG was done today and reviewed by me. There is low voltage. This is old. I have compared this tracing to her study of 2013. There is no significant change.  ASSESSMENT & PLAN

## 2013-06-30 NOTE — Assessment & Plan Note (Signed)
At this time I feel her chest pain is not cardiac. I have reassured her. We have decided not to do any type of exercise testing. She tells me that her husband recently decided to see a physician after having exertional chest discomfort for a long time. He required bypass surgery and is doing well. She will be out of town for a month. I'm comfortable with [place] to the beach. She will keep her appointment with me in May and we will discuss further she's having any continued symptoms.

## 2013-08-10 ENCOUNTER — Telehealth (INDEPENDENT_AMBULATORY_CARE_PROVIDER_SITE_OTHER): Payer: Self-pay

## 2013-08-10 DIAGNOSIS — E041 Nontoxic single thyroid nodule: Secondary | ICD-10-CM

## 2013-08-10 NOTE — Telephone Encounter (Signed)
LMOM for pt to call. Received refill request for thyroid medication. We have not seen pt in 2 years. Pt was due for u/s and TSH in June of 2014. Pt needs u/s ,labs and office visit.  Need to know is Dr Damita Dunnings following this and has this MD been refilling pts rx.

## 2013-08-13 NOTE — Telephone Encounter (Signed)
Received a second request from [facility] re: levothyroxine. I spoke with [facility] and they stated this was to go to Delia Chimes NP. They will resend it to her office.

## 2013-08-16 ENCOUNTER — Other Ambulatory Visit (INDEPENDENT_AMBULATORY_CARE_PROVIDER_SITE_OTHER): Payer: Self-pay

## 2013-08-16 DIAGNOSIS — E042 Nontoxic multinodular goiter: Secondary | ICD-10-CM

## 2013-08-16 LAB — TSH: TSH: 2.864 u[IU]/mL (ref 0.350–4.500)

## 2013-08-16 NOTE — Telephone Encounter (Signed)
Pt returned call. Pt advised of need for TSH and U/S. Pt advised I will send a request to Dr Harlow Asa for pt to get one month refill of thyroid med until she can have labs drawn this week and U/S set up.

## 2013-08-17 MED ORDER — LEVOTHYROXINE SODIUM 25 MCG PO TABS: 50.0000 ug | ORAL_TABLET | Freq: Every day | ORAL | Status: DC

## 2013-08-17 NOTE — Addendum Note (Signed)
Addended by: Ivor Costa on: 08/17/2013 05:04 PM   Modules accepted: Orders

## 2013-08-17 NOTE — Telephone Encounter (Signed)
Pt called back asking about refill request. Request sent again to Dr Harlow Asa.

## 2013-08-17 NOTE — Telephone Encounter (Signed)
Pt calling again to request refill on her thyroid meds. She only has one pill left at this time. Please advise

## 2013-08-17 NOTE — Telephone Encounter (Signed)
Called and spoke to patient to make aware her rx for Levothyroxine #12was [facility] [place], [place].  Patient advised she need's to have [place] and that we will call when we get that scheduled for her.  Patient aware to call and make follow up appointment after she has a date for her [place].

## 2013-08-18 ENCOUNTER — Telehealth (INDEPENDENT_AMBULATORY_CARE_PROVIDER_SITE_OTHER): Payer: Self-pay

## 2013-08-18 NOTE — Telephone Encounter (Signed)
Pt advised of u/s date and lab result. Pt will keep appt 6-8 with Dr Harlow Asa.

## 2013-08-20 ENCOUNTER — Other Ambulatory Visit: Payer: Self-pay | Admitting: Cardiology

## 2013-08-20 ENCOUNTER — Ambulatory Visit
Admission: RE | Admit: 2013-08-20 | Discharge: 2013-08-20 | Disposition: A | Discharge status: Home / Self Care | Payer: Medicare Other | Source: Ambulatory Visit | Attending: Surgery | Admitting: Surgery

## 2013-08-20 ENCOUNTER — Encounter (INDEPENDENT_AMBULATORY_CARE_PROVIDER_SITE_OTHER): Payer: Self-pay

## 2013-08-20 DIAGNOSIS — E042 Nontoxic multinodular goiter: Secondary | ICD-10-CM

## 2013-09-15 ENCOUNTER — Encounter: Payer: Self-pay | Admitting: Cardiology

## 2013-09-15 ENCOUNTER — Ambulatory Visit (INDEPENDENT_AMBULATORY_CARE_PROVIDER_SITE_OTHER): Payer: Medicare Other | Admitting: Cardiology

## 2013-09-15 VITALS — BP 102/70 | HR 76 | Ht <= 58 in | Wt 145.0 lb

## 2013-09-15 DIAGNOSIS — I059 Rheumatic mitral valve disease, unspecified: Secondary | ICD-10-CM

## 2013-09-15 DIAGNOSIS — I340 Nonrheumatic mitral (valve) insufficiency: Secondary | ICD-10-CM

## 2013-09-15 DIAGNOSIS — R079 Chest pain, unspecified: Secondary | ICD-10-CM

## 2013-09-15 NOTE — Patient Instructions (Signed)
**Note De-identified Lazlo Tunney Obfuscation** Your physician recommends that you continue on your current medications as directed. Please refer to the Current Medication list given to you today.  Your physician wants you to follow-up in: 6 months. You will receive a reminder letter in the mail two months in advance. If you don't receive a letter, please call our office to schedule the follow-up appointment.  

## 2013-09-15 NOTE — Assessment & Plan Note (Signed)
My prior notes outline the extensive evaluation of her mitral regurgitation. This is stable. No tests are needed at this time.

## 2013-09-15 NOTE — Progress Notes (Signed)
Patient ID: [name] [name], female   DOB: 23-Mar-1936, 78 y.o.   MRN: [id number]    HPI  Patient is seen today to followup some chest discomfort that she had had in March, 2015. At that time we decided to watch her clinically. She was under significant stress helping her husband recover from bypass surgery at that time. She's doing much better. She's not having any significant pain. At that time I did not arrange for any significant testing.  Allergies  Allergen Reactions  . Nsaids     REACTION: No long term use  . Statins     Intolerant, myalgias  . Trilipix [Choline Fenofibrate]     GI upset    Current Outpatient Prescriptions  Medication Sig Dispense Refill  . ALPHA LIPOIC ACID PO Daily      . Biotin 10 MG TABS Daily      . calcium carbonate (OS-CAL) 600 MG TABS Take 1,200 mg by mouth 2 (two) times daily with a meal.      . cholecalciferol (VITAMIN D) 1000 UNITS tablet Take 1,000 Units by mouth daily.      Marland Kitchen esomeprazole (NEXIUM) 20 MG capsule Take 20 mg by mouth daily at 12 noon.      . fish oil-omega-3 fatty acids 1000 MG capsule Take 2 g by mouth daily.      Marland Kitchen gabapentin (NEURONTIN) 100 MG capsule Take 100 mg by mouth at bedtime.       . Ginger, Zingiber officinalis, (GINGER PO) Take 1 tablet by mouth daily.      Marland Kitchen levothyroxine (SYNTHROID, LEVOTHROID) 25 MCG tablet Take 2 tablets (50 mcg total) by mouth daily.  60 tablet  12  . LIDODERM 5 % as needed.      . metoprolol (LOPRESSOR) 50 MG tablet TAKE ONE-HALF TABLET (25 MG) TWICE A DAY  90 tablet  0  . NON FORMULARY Tumeric      Daily        . PREMARIN 0.45 MG tablet Take 0.45 mg by mouth daily.       Marland Kitchen sulfamethoxazole-trimethoprim (BACTRIM DS) 800-160 MG per tablet Take 1 tablet by mouth 2 (two) times daily.       . VOLTAREN 1 % GEL Apply 1 application topically as needed. pain       No current facility-administered medications for this visit.    History   Social History  . Marital Status: Married    Spouse Name: N/A   Number of Children: N/A  . Years of Education: N/A   Occupational History  . Not on file.   Social History Main Topics  . Smoking status: Never Smoker   . Smokeless tobacco: Never Used  . Alcohol Use: No  . Drug Use: No  . Sexual Activity: Not on file   Other Topics Concern  . Not on file   Social History Narrative   From [place]   Married (second [phone]   3 kids, 2 of whom are local   Retired, Camera operator    Family History  Problem Relation Age of Onset  . GER disease Mother   . Dementia Mother   . Breast cancer Mother   . Heart disease Father     MI at 73  . Colon cancer Neg Hx     Past Medical History  Diagnosis Date  . Dyslipidemia   . Chest pain     Felt to be noncardiac in the past  . Statin intolerance  As of 2009, no further attempts to use statins  . Mitral valve prolapse     Mild mitral regurgitation echo, 2007  . Ejection fraction     EF 60%, echo, 2007, atrial septum bows left to right compatible with increased left atrial pressure  . Hiatal hernia   . Osteoarthritis   . GERD (gastroesophageal reflux disease)     Barrett's esophagus  . Colon polyp   . Fibromyalgia   . Sinusitis     Multiple sinus operations in the past  . Lumbar spine pain     Complex lumbar spine surgery at [facility], 1610, complicated, MRSA, much of the appliance is removed, patient on chronic antibiotics  . Mitral regurgitation     Mild, 2007,  /  moderate or severe echo, September, 2012, eccentric jet wrapping the left atrium, consider TEE for further assessment  . Scoliosis   . Anxiety     prev on prozac  . Melanoma   . Thyroid nodule     Past Surgical History  Procedure Laterality Date  . Back surgery    . Abdominal hysterectomy    . Cholecystectomy    . Hernia repair    . Nasal sinus surgery    . Foot surgery      Patient Active Problem List   Diagnosis Date Noted  . Mitral regurgitation     Priority: High  . Dyslipidemia     Priority: High    . Chest pain     Priority: High  . Statin intolerance     Priority: High  . Mitral valve prolapse     Priority: High  . Ejection fraction     Priority: High  . Fibromyalgia     Priority: High  . Thyroid nodule, uninodular 09/18/2011  . Hypothyroid 08/19/2011  . Postmenopausal HRT (hormone replacement therapy) 08/13/2011  . Lumbar spine pain   . FIBROMYALGIA 12/31/2008  . ADENOMATOUS COLONIC POLYP 08/15/2007  . GERD 08/15/2007  . BARRETTS ESOPHAGUS 08/15/2007  . HIATAL HERNIA 08/15/2007  . OSTEOARTHRITIS, GENERALIZED 08/15/2007  . CATARACT EXTRACTION, HX OF 08/15/2007    ROS   Patient denies fever, chills, headache, sweats, rash, change in vision, change in hearing, chest pain, cough, nausea vomiting, urinary symptoms. All other systems are reviewed and are negative.  PHYSICAL EXAM  Patient is oriented to person time and place. Affect is normal. There is no jugular venous distention. Head is atraumatic. Sclera and conjunctiva are normal. Lungs are clear. Respiratory effort is nonlabored. Cardiac exam reveals S1 and S2. Abdomen is soft. There is no peripheral edema. There no musculoskeletal deformities. There are no skin rashes.  Filed Vitals:   09/15/13 0938  BP: 102/70  Pulse: 76  Height: 4\' 9"  (1.448 m)  Weight: 145 lb (65.772 kg)    ASSESSMENT & PLAN

## 2013-09-15 NOTE — Assessment & Plan Note (Signed)
Fortunately she feels much better. She is not having any recurring chest discomfort. No further workup is needed at this time.

## 2013-09-20 ENCOUNTER — Encounter (INDEPENDENT_AMBULATORY_CARE_PROVIDER_SITE_OTHER): Payer: Self-pay | Admitting: Surgery

## 2013-09-20 ENCOUNTER — Ambulatory Visit (INDEPENDENT_AMBULATORY_CARE_PROVIDER_SITE_OTHER): Payer: Medicare Other | Admitting: Surgery

## 2013-09-20 VITALS — BP 118/70 | HR 74 | Temp 97.6°F | Ht <= 58 in | Wt 144.0 lb

## 2013-09-20 DIAGNOSIS — E039 Hypothyroidism, unspecified: Secondary | ICD-10-CM

## 2013-09-20 DIAGNOSIS — E041 Nontoxic single thyroid nodule: Secondary | ICD-10-CM

## 2013-09-20 NOTE — Progress Notes (Signed)
General Surgery [facility]  Chief Complaint  Patient presents with  . Follow-up    right thyroid nodule    HISTORY: Patient is a 78 year old female followed for right-sided thyroid nodule. She was last evaluated in my practice in 2013. At my request she underwent a thyroid ultrasound on 08/20/2013. This shows a solitary dominant nodule in the upper pole of the right thyroid lobe measuring 17 x 10 x 13 mm with calcifications. This has previously been biopsied with benign cytopathology. The lesion is unchanged compared to her prior study in 2013. No other significant nodules are identified.  TSH level is normal at 2.864 on her present dose of Synthroid 50 mcg daily.  PERTINENT REVIEW OF SYSTEMS: Denies tremor. Denies palpitation. Denies compressive symptoms. Denies new probable masses.  EXAM: HEENT: normocephalic; pupils equal and reactive; sclerae clear; dentition good; mucous membranes moist NECK:  No palpable masses in the thyroid bed; symmetric on extension; no palpable anterior or posterior cervical lymphadenopathy; no supraclavicular masses; no tenderness CHEST: clear to auscultation bilaterally without rales, rhonchi, or wheezes CARDIAC: regular rate and rhythm without significant murmur; peripheral pulses are full EXT:  non-tender without edema; no deformity NEURO: no gross focal deficits; no sign of tremor   IMPRESSION: Right thyroid nodule, 1.7 cm, clinically stable  PLAN: The patient and I reviewed the ultrasound results and her laboratory studies. At this point I see no role for surgical intervention. She will remain on Synthroid 50 mcg daily for her hypothyroidism. I would like to see her back in 2 years with a repeat thyroid ultrasound for physical examination. If everything remains stable at that point in time, I will discharge her from further surgical follow-up.  Patient should continue to be evaluated by her primary care physician on an annual  basis. She should have a TSH level obtained annually.  Earnstine Regal, MD, [facility] Office: [phone]  Visit Diagnoses: 1. Thyroid nodule, uninodular   2. Hypothyroid

## 2013-09-20 NOTE — Patient Instructions (Signed)

## 2013-10-12 ENCOUNTER — Other Ambulatory Visit: Payer: Self-pay

## 2013-10-12 DIAGNOSIS — Z1231 Encounter for screening mammogram for malignant neoplasm of breast: Secondary | ICD-10-CM

## 2013-10-26 ENCOUNTER — Other Ambulatory Visit: Payer: Self-pay | Admitting: Cardiology

## 2013-11-05 ENCOUNTER — Ambulatory Visit
Admission: RE | Admit: 2013-11-05 | Discharge: 2013-11-05 | Disposition: A | Discharge status: Home / Self Care | Payer: Medicare Other | Source: Ambulatory Visit

## 2013-11-05 ENCOUNTER — Encounter (INDEPENDENT_AMBULATORY_CARE_PROVIDER_SITE_OTHER): Payer: Self-pay

## 2013-11-05 DIAGNOSIS — Z1231 Encounter for screening mammogram for malignant neoplasm of breast: Secondary | ICD-10-CM

## 2014-03-14 ENCOUNTER — Ambulatory Visit: Payer: Medicare Other | Admitting: Cardiology

## 2014-03-23 ENCOUNTER — Encounter: Payer: Self-pay | Admitting: Cardiology

## 2014-03-23 ENCOUNTER — Ambulatory Visit (INDEPENDENT_AMBULATORY_CARE_PROVIDER_SITE_OTHER): Payer: Medicare Other | Admitting: Cardiology

## 2014-03-23 VITALS — BP 110/70 | HR 78 | Ht <= 58 in | Wt 139.4 lb

## 2014-03-23 DIAGNOSIS — R072 Precordial pain: Secondary | ICD-10-CM

## 2014-03-23 DIAGNOSIS — I340 Nonrheumatic mitral (valve) insufficiency: Secondary | ICD-10-CM

## 2014-03-23 NOTE — Progress Notes (Signed)
Patient ID: [name] [name], female   DOB: 1935-11-19, 78 y.o.   MRN: [id number]    HPI Patient is seen in follow-up history of chest discomfort and mitral regurgitation. She is actually doing well. She had significant stress during the time of her husband's recovery from bypass surgery. He is doing better now. She is also doing better.  Allergies  Allergen Reactions  . Nsaids     REACTION: No long term use  . Statins     Intolerant, myalgias  . Trilipix [Choline Fenofibrate]     GI upset    Current Outpatient Prescriptions  Medication Sig Dispense Refill  . ALPHA LIPOIC ACID PO Daily    . Biotin 10 MG TABS Daily    . calcium carbonate (OS-CAL) 600 MG TABS Take 1,200 mg by mouth 2 (two) times daily with a meal.    . cholecalciferol (VITAMIN D) 1000 UNITS tablet Take 1,000 Units by mouth daily.    Marland Kitchen esomeprazole (NEXIUM) 20 MG capsule Take 20 mg by mouth daily at 12 noon.    . fish oil-omega-3 fatty acids 1000 MG capsule Take 2 g by mouth daily.    Marland Kitchen gabapentin (NEURONTIN) 100 MG capsule Take 100 mg by mouth at bedtime.     . Ginger, Zingiber officinalis, (GINGER PO) Take 1 tablet by mouth daily.    Marland Kitchen levothyroxine (SYNTHROID, LEVOTHROID) 25 MCG tablet Take 2 tablets (50 mcg total) by mouth daily. 60 tablet 12  . LIDODERM 5 % as needed.    . metoprolol (LOPRESSOR) 50 MG tablet TAKE ONE-HALF (1/2) TABLET TWICE A DAY 90 tablet 1  . NON FORMULARY Tumeric      Daily      . OXISTAT 1 % CREA   3  . PREMARIN 0.45 MG tablet Take 0.45 mg by mouth daily.     Marland Kitchen sulfamethoxazole-trimethoprim (BACTRIM DS) 800-160 MG per tablet Take 1 tablet by mouth 2 (two) times daily.     Marland Kitchen tretinoin (RETIN-A) 0.05 % cream   3  . VOLTAREN 1 % GEL Apply 1 application topically as needed. pain     No current facility-administered medications for this visit.    History   Social History  . Marital Status: Married    Spouse Name: N/A    Number of Children: N/A  . Years of Education: N/A   Occupational  History  . Not on file.   Social History Main Topics  . Smoking status: Never Smoker   . Smokeless tobacco: Never Used  . Alcohol Use: No  . Drug Use: No  . Sexual Activity: Not on file   Other Topics Concern  . Not on file   Social History Narrative   From [place]   Married (second [phone]   3 kids, 2 of whom are local   Retired, Camera operator    Family History  Problem Relation Age of Onset  . GER disease Mother   . Dementia Mother   . Breast cancer Mother   . Heart disease Father     MI at 85  . Colon cancer Neg Hx     Past Medical History  Diagnosis Date  . Dyslipidemia   . Chest pain     Felt to be noncardiac in the past  . Statin intolerance     As of 2009, no further attempts to use statins  . Mitral valve prolapse     Mild mitral regurgitation echo, 2007  . Ejection fraction  EF 60%, echo, 2007, atrial septum bows left to right compatible with increased left atrial pressure  . Hiatal hernia   . Osteoarthritis   . GERD (gastroesophageal reflux disease)     Barrett's esophagus  . Colon polyp   . Fibromyalgia   . Sinusitis     Multiple sinus operations in the past  . Lumbar spine pain     Complex lumbar spine surgery at [facility], 0459, complicated, MRSA, much of the appliance is removed, patient on chronic antibiotics  . Mitral regurgitation     Mild, 2007,  /  moderate or severe echo, September, 2012, eccentric jet wrapping the left atrium, consider TEE for further assessment  . Scoliosis   . Anxiety     prev on prozac  . Melanoma   . Thyroid nodule     Past Surgical History  Procedure Laterality Date  . Back surgery    . Abdominal hysterectomy    . Cholecystectomy    . Hernia repair    . Nasal sinus surgery    . Foot surgery      Patient Active Problem List   Diagnosis Date Noted  . Mitral regurgitation     Priority: High  . Dyslipidemia     Priority: High  . Chest pain     Priority: High  . Statin intolerance      Priority: High  . Mitral valve prolapse     Priority: High  . Ejection fraction     Priority: High  . Fibromyalgia     Priority: High  . Thyroid nodule, uninodular 09/18/2011  . Hypothyroid 08/19/2011  . Postmenopausal HRT (hormone replacement therapy) 08/13/2011  . Lumbar spine pain   . FIBROMYALGIA 12/31/2008  . ADENOMATOUS COLONIC POLYP 08/15/2007  . GERD 08/15/2007  . BARRETTS ESOPHAGUS 08/15/2007  . HIATAL HERNIA 08/15/2007  . OSTEOARTHRITIS, GENERALIZED 08/15/2007  . CATARACT EXTRACTION, HX OF 08/15/2007    ROS  Patient denies fever, chills, headache, sweats, rash, change in vision, change in hearing, chest pain, cough, nausea or vomiting, urinary symptoms. All other systems are reviewed and are negative.  PHYSICAL EXAM Patient is oriented to person time and place. Affect is normal. Head is atraumatic. Sclera and conjunctiva are normal. There is no jugular venous distention. Lungs are clear. Respiratory effort is nonlabored. Cardiac exam reveals S1 and S2. Abdomen is soft. There is no peripheral edema.  Filed Vitals:   03/23/14 1514  BP: 110/70  Pulse: 78  Height: 4\' 9"  (1.448 m)  Weight: 139 lb 6.4 oz (63.231 kg)  SpO2: 96%     ASSESSMENT & PLAN

## 2014-03-23 NOTE — Assessment & Plan Note (Signed)
Her mitral valve status is stable. She does not need any further assessment at this time.

## 2014-03-23 NOTE — Patient Instructions (Signed)
**Note De-identified [name] [name] Obfuscation** Your physician recommends that you continue on your current medications as directed. Please refer to the Current Medication list given to you today.  Your physician wants you to follow-up in: 6 months. You will receive a reminder letter in the mail two months in advance. If you don't receive a letter, please call our office to schedule the follow-up appointment.  

## 2014-03-23 NOTE — Assessment & Plan Note (Signed)
She is not having any significant chest pain at this time. No further workup.

## 2014-03-30 ENCOUNTER — Ambulatory Visit: Payer: Medicare Other | Admitting: Cardiology

## 2014-04-24 ENCOUNTER — Other Ambulatory Visit: Payer: Self-pay | Admitting: Cardiology

## 2014-09-19 ENCOUNTER — Encounter: Payer: Self-pay | Admitting: Cardiology

## 2014-09-19 ENCOUNTER — Ambulatory Visit (INDEPENDENT_AMBULATORY_CARE_PROVIDER_SITE_OTHER): Payer: Medicare Other | Admitting: Cardiology

## 2014-09-19 VITALS — BP 116/80 | HR 70 | Ht <= 58 in | Wt 132.2 lb

## 2014-09-19 DIAGNOSIS — I340 Nonrheumatic mitral (valve) insufficiency: Secondary | ICD-10-CM

## 2014-09-19 DIAGNOSIS — R002 Palpitations: Secondary | ICD-10-CM | POA: Diagnosis not present

## 2014-09-19 DIAGNOSIS — Z889 Allergy status to unspecified drugs, medicaments and biological substances status: Secondary | ICD-10-CM

## 2014-09-19 DIAGNOSIS — R072 Precordial pain: Secondary | ICD-10-CM

## 2014-09-19 DIAGNOSIS — Z789 Other specified health status: Secondary | ICD-10-CM

## 2014-09-19 MED ORDER — METOPROLOL TARTRATE 50 MG PO TABS
ORAL_TABLET | ORAL | Status: DC
Start: 2014-09-19 — End: 2015-07-03

## 2014-09-19 NOTE — Assessment & Plan Note (Signed)
There was an echo in September, 2012 with question of moderate or severe mitral regurgitation with a jet wrapping the atrium. However follow-up echo in November, 2013 revealed only mild mitral regurgitation. She has been stable. It is now time for a follow-up echo.

## 2014-09-19 NOTE — Patient Instructions (Addendum)
**Note De-Identified [name] [name] Obfuscation** Medication Instructions:  Increase Lopressor (Metoprolol) to 50 mg (whole tablet) in the morning and 25 mg (1/2 tablet) in the evenings.  Labwork: None  Testing/Procedures: Your physician has requested that you have an echocardiogram. Echocardiography is a painless test that uses sound waves to create images of your heart. It provides your doctor with information about the size and shape of your heart and how well your heart's chambers and valves are working. This procedure takes approximately one hour. There are no restrictions for this procedure.   Follow-Up: Your physician wants you to follow-up in: 6 months with Dr Clydene Fake. You will receive a reminder letter in the mail two months in advance. If you don't receive a letter, please call our office to schedule the follow-up appointment.

## 2014-09-19 NOTE — Assessment & Plan Note (Signed)
There is a history of fibromyalgia and statin intolerance. Therefore she is not on a statin.

## 2014-09-19 NOTE — Progress Notes (Signed)
Cardiology Office Note   Date:  09/19/2014   ID:  [name], DOB 23-Oct-1935, MRN [id number]  PCP:  Elsie Stain, MD  Cardiologist:  Dola Argyle, MD   Chief Complaint  Patient presents with  . Appointment    Follow-up mitral regurgitation      History of Present Illness: [name] [name] is a 79 y.o. female who presents to follow-up mitral regurgitation. She's not having any chest pain or shortness of breath. She's not having any significant chest pain.  History she's had mitral regurgitation. In the past it was felt to be more significant. However, her last echo in 2013 showed only mild mitral regurgitation. We will arrange for a follow-up echo in the near future.  The patient is aware that I will be retiring at the end of September, 2016. She wants to be followed by Dr. Angelena Form, who follows her husband. We will arrange this for a 6 month follow-up.    Past Medical History  Diagnosis Date  . Dyslipidemia   . Chest pain     Felt to be noncardiac in the past  . Statin intolerance     As of 2009, no further attempts to use statins  . Mitral valve prolapse     Mild mitral regurgitation echo, 2007  . Ejection fraction     EF 60%, echo, 2007, atrial septum bows left to right compatible with increased left atrial pressure  . Hiatal hernia   . Osteoarthritis   . GERD (gastroesophageal reflux disease)     Barrett's esophagus  . Colon polyp   . Fibromyalgia   . Sinusitis     Multiple sinus operations in the past  . Lumbar spine pain     Complex lumbar spine surgery at [facility], 0814, complicated, MRSA, much of the appliance is removed, patient on chronic antibiotics  . Mitral regurgitation     Mild, 2007,  /  moderate or severe echo, September, 2012, eccentric jet wrapping the left atrium, consider TEE for further assessment  . Scoliosis   . Anxiety     prev on prozac  . Melanoma   . Thyroid nodule     Past Surgical History  Procedure Laterality Date  . Back surgery      . Abdominal hysterectomy    . Cholecystectomy    . Hernia repair    . Nasal sinus surgery    . Foot surgery      Patient Active Problem List   Diagnosis Date Noted  . Mitral regurgitation     Priority: High  . Dyslipidemia     Priority: High  . Chest pain     Priority: High  . Statin intolerance     Priority: High  . Mitral valve prolapse     Priority: High  . Ejection fraction     Priority: High  . Fibromyalgia     Priority: High  . Thyroid nodule, uninodular 09/18/2011  . Hypothyroid 08/19/2011  . Postmenopausal HRT (hormone replacement therapy) 08/13/2011  . Lumbar spine pain   . FIBROMYALGIA 12/31/2008  . ADENOMATOUS COLONIC POLYP 08/15/2007  . GERD 08/15/2007  . BARRETTS ESOPHAGUS 08/15/2007  . HIATAL HERNIA 08/15/2007  . OSTEOARTHRITIS, GENERALIZED 08/15/2007  . CATARACT EXTRACTION, HX OF 08/15/2007      Current Outpatient Prescriptions  Medication Sig Dispense Refill  . ALPHA LIPOIC ACID PO Daily    . Biotin 10 MG TABS Daily    . calcium carbonate (OS-CAL) 600 MG TABS Take  1,200 mg by mouth 2 (two) times daily with a meal.    . cholecalciferol (VITAMIN D) 1000 UNITS tablet Take 1,000 Units by mouth daily.    Marland Kitchen esomeprazole (NEXIUM) 20 MG capsule Take 20 mg by mouth daily at 12 noon.    . fish oil-omega-3 fatty acids 1000 MG capsule Take 2 g by mouth daily.    Marland Kitchen gabapentin (NEURONTIN) 100 MG capsule Take 100 mg by mouth at bedtime.     . Ginger, Zingiber officinalis, (GINGER PO) Take 1 tablet by mouth daily.    Marland Kitchen levothyroxine (SYNTHROID, LEVOTHROID) 25 MCG tablet Take 2 tablets (50 mcg total) by mouth daily. 60 tablet 12  . LIDODERM 5 % as needed.    . metoprolol (LOPRESSOR) 50 MG tablet TAKE ONE-HALF (1/2) TABLET TWICE A DAY 90 tablet 1  . NON FORMULARY Tumeric      Daily      . OXISTAT 1 % CREA   3  . PREMARIN 0.45 MG tablet Take 0.45 mg by mouth daily.     Marland Kitchen sulfamethoxazole-trimethoprim (BACTRIM DS) 800-160 MG per tablet Take 1 tablet by mouth 2  (two) times daily.     Marland Kitchen tretinoin (RETIN-A) 0.05 % cream   3  . VOLTAREN 1 % GEL Apply 1 application topically as needed. pain     No current facility-administered medications for this visit.    Allergies:   Fenofibric acid; Nsaids; Statins; and Trilipix    Social History:  The patient  reports that she has never smoked. She has never used smokeless tobacco. She reports that she does not drink alcohol or use illicit drugs.   Family History:  The patient's family history includes Breast cancer in her mother; Dementia in her mother; GER disease in her mother; Heart disease in her father. There is no history of Colon cancer.    ROS:  Please see the history of present illness.     Patient denies fever, chills, headache, sweats, rash, change in vision, change in hearing, chest pain, cough, nausea or vomiting, urinary symptoms. All other systems are reviewed and are negative.   PHYSICAL EXAM: VS:  BP 116/80 mmHg  Pulse 70  Ht 4\' 9"  (1.448 m)  Wt 132 lb 3.2 oz (59.966 kg)  BMI 28.60 kg/m2 ,   EKG:   EKG is done today and reviewed by me. There is normal sinus rhythm. There is no significant abnormality.   Recent Labs: No results found for requested labs within last 365 days.    Lipid Panel    Component Value Date/Time   CHOL 226* 12/17/2007 0806   TRIG 146 12/17/2007 0806   HDL 50.0 12/17/2007 0806   CHOLHDL 4.5 CALC 12/17/2007 0806   VLDL 29 12/17/2007 0806   LDLCALC 112* 06/26/2006 0844   LDLDIRECT 132.0 12/17/2007 0806      Wt Readings from Last 3 Encounters:  09/19/14 132 lb 3.2 oz (59.966 kg)  03/23/14 139 lb 6.4 oz (63.231 kg)  09/20/13 144 lb (65.318 kg)      Current medicines are reviewed  The patient understands her medications.   ASSESSMENT AND PLAN:

## 2014-09-19 NOTE — Assessment & Plan Note (Signed)
The patient has had some noncardiac chest pain in the past. She is not having any chest pain now. No further workup.

## 2014-09-19 NOTE — Assessment & Plan Note (Signed)
The patient has a sensation of increased heart rate at times. This has been unresponsive to beta blockers. She has been on 25 mg of metoprolol twice a day for this. She feels that she is sensing some increased heart rate before her evening dose. We will give her a trial of increasing her morning dose to 50 mg and keeping the evening dose at 25 mg. She will see how she feels with this.  Her follow-up will be with Dr. Angelena Form, who takes care of her husband also.

## 2014-09-28 ENCOUNTER — Encounter: Payer: Self-pay | Admitting: Internal Medicine

## 2014-09-28 ENCOUNTER — Encounter: Payer: Self-pay | Admitting: Gastroenterology

## 2014-10-10 ENCOUNTER — Other Ambulatory Visit (HOSPITAL_COMMUNITY): Payer: Medicare Other

## 2014-10-18 ENCOUNTER — Ambulatory Visit (HOSPITAL_COMMUNITY): Payer: Medicare Other | Attending: Cardiology

## 2014-10-18 ENCOUNTER — Other Ambulatory Visit: Payer: Self-pay

## 2014-10-18 DIAGNOSIS — E785 Hyperlipidemia, unspecified: Secondary | ICD-10-CM | POA: Insufficient documentation

## 2014-10-18 DIAGNOSIS — I341 Nonrheumatic mitral (valve) prolapse: Secondary | ICD-10-CM | POA: Insufficient documentation

## 2014-10-18 DIAGNOSIS — I253 Aneurysm of heart: Secondary | ICD-10-CM | POA: Diagnosis not present

## 2014-10-18 DIAGNOSIS — I340 Nonrheumatic mitral (valve) insufficiency: Secondary | ICD-10-CM | POA: Diagnosis present

## 2014-11-30 ENCOUNTER — Other Ambulatory Visit: Payer: Self-pay

## 2014-11-30 DIAGNOSIS — Z1231 Encounter for screening mammogram for malignant neoplasm of breast: Secondary | ICD-10-CM

## 2015-01-24 ENCOUNTER — Ambulatory Visit
Admission: RE | Admit: 2015-01-24 | Discharge: 2015-01-24 | Disposition: A | Discharge status: Home / Self Care | Payer: Medicare Other | Source: Ambulatory Visit

## 2015-01-24 DIAGNOSIS — Z1231 Encounter for screening mammogram for malignant neoplasm of breast: Secondary | ICD-10-CM

## 2015-05-04 ENCOUNTER — Encounter: Payer: Medicare Other | Admitting: Cardiovascular Disease

## 2015-07-03 ENCOUNTER — Encounter: Payer: Self-pay | Admitting: Cardiovascular Disease

## 2015-07-03 ENCOUNTER — Ambulatory Visit (INDEPENDENT_AMBULATORY_CARE_PROVIDER_SITE_OTHER): Payer: Medicare Other | Admitting: Cardiovascular Disease

## 2015-07-03 ENCOUNTER — Encounter (INDEPENDENT_AMBULATORY_CARE_PROVIDER_SITE_OTHER): Payer: Self-pay

## 2015-07-03 VITALS — BP 118/72 | HR 60 | Ht <= 58 in | Wt 139.4 lb

## 2015-07-03 DIAGNOSIS — R002 Palpitations: Secondary | ICD-10-CM

## 2015-07-03 DIAGNOSIS — I340 Nonrheumatic mitral (valve) insufficiency: Secondary | ICD-10-CM

## 2015-07-03 MED ORDER — METOPROLOL TARTRATE 50 MG PO TABS: ORAL_TABLET | ORAL | Status: DC

## 2015-07-03 NOTE — Progress Notes (Signed)
Chief Complaint  Patient presents with  . Follow-up     History of Present Illness: 80 yo female with history of HLD but statin intolerance, GERD, hiatal hernia, fibromyalgia and mitral valve disease who is here today to establish in my clinic. She has been followed by Dr. Ron Parker. I also see her husband Milda Towles. Last echo July 2016 with normal LV systolic function, mild to moderate mitral regurgitation. She tells me today that she feels well. She has no chest pain or SOB. She is exercising several days per week. No near syncope, syncope or LE edema.   Primary Care Physician: Delia Chimes, NP  Past Medical History  Diagnosis Date  . Dyslipidemia   . Chest pain     Felt to be noncardiac in the past  . Statin intolerance     As of 2009, no further attempts to use statins  . Mitral valve prolapse     Mild mitral regurgitation echo, 2007  . Ejection fraction     EF 60%, echo, 2007, atrial septum bows left to right compatible with increased left atrial pressure  . Hiatal hernia   . Osteoarthritis   . GERD (gastroesophageal reflux disease)     Barrett's esophagus  . Colon polyp   . Fibromyalgia   . Sinusitis     Multiple sinus operations in the past  . Lumbar spine pain     Complex lumbar spine surgery at [facility], 0000000, complicated, MRSA, much of the appliance is removed, patient on chronic antibiotics  . Mitral regurgitation     Mild, 2007,  /  moderate or severe echo, September, 2012, eccentric jet wrapping the left atrium, consider TEE for further assessment  . Scoliosis   . Anxiety     prev on prozac  . Melanoma ([place])   . Thyroid nodule     Past Surgical History  Procedure Laterality Date  . Back surgery    . Abdominal hysterectomy    . Cholecystectomy    . Hernia repair    . Nasal sinus surgery    . Foot surgery      Current Outpatient Prescriptions  Medication Sig Dispense Refill  . ALPHA LIPOIC ACID PO Take 1 tablet by mouth daily. Daily     . Biotin 10 MG TABS  Take 1 tablet by mouth daily. Daily     . calcium carbonate (OS-CAL) 600 MG TABS Take 1,200 mg by mouth 2 (two) times daily with a meal.    . cholecalciferol (VITAMIN D) 1000 UNITS tablet Take 1,000 Units by mouth daily.    . fish oil-omega-3 fatty acids 1000 MG capsule Take 2 g by mouth daily.    Marland Kitchen gabapentin (NEURONTIN) 100 MG capsule Take 100 mg by mouth at bedtime.     . Ginger, Zingiber officinalis, (GINGER PO) Take 1 tablet by mouth daily.    Marland Kitchen levothyroxine (SYNTHROID, LEVOTHROID) 25 MCG tablet Take 2 tablets (50 mcg total) by mouth daily. 60 tablet 12  . LIDODERM 5 % Place onto the skin as needed (1-3 patches as needed for pain).     . metoprolol (LOPRESSOR) 50 MG tablet Take 50 mg in the am and 25 mg in the pm 135 tablet 3  . NON FORMULARY Take 1 capsule by mouth daily. Tumeric      Daily     . PREMARIN 0.45 MG tablet Take 0.45 mg by mouth daily.     Marland Kitchen sulfamethoxazole-trimethoprim (BACTRIM DS) 800-160 MG per tablet Take 1  tablet by mouth 2 (two) times daily.     Marland Kitchen tretinoin (RETIN-A) 0.05 % cream Apply 1 application topically daily as needed (as directed per dermatologist).   3  . VOLTAREN 1 % GEL Apply 1 application topically as needed. pain     No current facility-administered medications for this visit.    Allergies  Allergen Reactions  . Fenofibric Acid Other (See Comments)    GI upset  . Nsaids     REACTION: No long term use  . Statins Other (See Comments)    Intolerant, myalgias Intolerant, myalgias  . Trilipix [Choline Fenofibrate]     GI upset    Social History   Social History  . Marital Status: Married    Spouse Name: N/A  . Number of Children: N/A  . Years of Education: N/A   Occupational History  . Not on file.   Social History Main Topics  . Smoking status: Never Smoker   . Smokeless tobacco: Never Used  . Alcohol Use: No  . Drug Use: No  . Sexual Activity: Not on file   Other Topics Concern  . Not on file   Social History Narrative   From  [place]   Married (second [phone]   3 kids, 2 of whom are local   Retired, Camera operator    Family History  Problem Relation Age of Onset  . GER disease Mother   . Dementia Mother   . Breast cancer Mother   . Heart disease Father     MI at 27  . Colon cancer Neg Hx     Review of Systems:  As stated in the HPI and otherwise negative.   BP 118/72 mmHg  Pulse 60  Ht 4\' 9"  (1.448 m)  Wt 139 lb 6.4 oz (63.231 kg)  BMI 30.16 kg/m2  Physical Examination: General: Well developed, well nourished, NAD HEENT: OP clear, mucus membranes moist SKIN: warm, dry. No rashes. Neuro: No focal deficits Musculoskeletal: Muscle strength 5/5 all ext Psychiatric: Mood and affect normal Neck: No JVD, no carotid bruits, no thyromegaly, no lymphadenopathy. Lungs:Clear bilaterally, no wheezes, rhonci, crackles Cardiovascular: Regular rate and rhythm. Soft systolic murmur. No gallops or rubs. Abdomen:Soft. Bowel sounds present. Non-tender.  Extremities: No lower extremity edema. Pulses are 2 + in the bilateral DP/PT.  EKG:  EKG is not ordered today. The ekg ordered today demonstrates   Recent Labs: No results found for requested labs within last 365 days.   Lipid Panel    Wt Readings from Last 3 Encounters:  07/03/15 139 lb 6.4 oz (63.231 kg)  09/19/14 132 lb 3.2 oz (59.966 kg)  03/23/14 139 lb 6.4 oz (63.231 kg)     Other studies Reviewed: Additional studies/ records that were reviewed today include: . Review of the above records demonstrates:    Assessment and Plan:   1. Mitral regurgitation: Mild to moderate by echo July 2016. Will repeat July 2018.   2. Palpitations: Will continue beta blocker   Current medicines are reviewed at length with the patient today.  The patient does not have concerns regarding medicines.  The following changes have been made:  no change  Labs/ tests ordered today include:  No orders of the defined types were placed in this encounter.     Disposition:   FU with me in 6 months  Signed, Lauree Chandler, MD 07/03/2015 9:58 AM    [address], [place], [place]  [address]  Phone: [phone]; Fax: [phone]

## 2015-07-03 NOTE — Patient Instructions (Signed)

## 2015-07-04 ENCOUNTER — Other Ambulatory Visit: Payer: Self-pay | Admitting: *Deleted

## 2015-07-04 MED ORDER — METOPROLOL TARTRATE 50 MG PO TABS: ORAL_TABLET | ORAL | Status: DC

## 2015-07-21 DIAGNOSIS — M4806 Spinal stenosis, lumbar region: Secondary | ICD-10-CM | POA: Diagnosis not present

## 2015-07-21 DIAGNOSIS — M545 Low back pain: Secondary | ICD-10-CM | POA: Diagnosis not present

## 2015-07-21 DIAGNOSIS — Z981 Arthrodesis status: Secondary | ICD-10-CM | POA: Diagnosis not present

## 2015-08-29 DIAGNOSIS — Z79899 Other long term (current) drug therapy: Secondary | ICD-10-CM | POA: Diagnosis not present

## 2015-08-31 DIAGNOSIS — M797 Fibromyalgia: Secondary | ICD-10-CM | POA: Diagnosis not present

## 2015-08-31 DIAGNOSIS — M545 Low back pain: Secondary | ICD-10-CM | POA: Diagnosis not present

## 2015-08-31 DIAGNOSIS — M79671 Pain in right foot: Secondary | ICD-10-CM | POA: Diagnosis not present

## 2015-08-31 DIAGNOSIS — G4709 Other insomnia: Secondary | ICD-10-CM | POA: Diagnosis not present

## 2015-09-13 ENCOUNTER — Other Ambulatory Visit: Payer: Self-pay | Admitting: Surgery

## 2015-09-13 DIAGNOSIS — E041 Nontoxic single thyroid nodule: Secondary | ICD-10-CM

## 2015-09-14 DIAGNOSIS — E039 Hypothyroidism, unspecified: Secondary | ICD-10-CM | POA: Diagnosis not present

## 2015-09-14 DIAGNOSIS — R5383 Other fatigue: Secondary | ICD-10-CM | POA: Diagnosis not present

## 2015-09-20 ENCOUNTER — Ambulatory Visit
Admission: RE | Admit: 2015-09-20 | Discharge: 2015-09-20 | Disposition: A | Discharge status: Home / Self Care | Payer: Medicare Other | Source: Ambulatory Visit | Attending: Surgery | Admitting: Surgery

## 2015-09-20 DIAGNOSIS — E041 Nontoxic single thyroid nodule: Secondary | ICD-10-CM

## 2015-09-20 DIAGNOSIS — E042 Nontoxic multinodular goiter: Secondary | ICD-10-CM | POA: Diagnosis not present

## 2015-10-03 DIAGNOSIS — M85852 Other specified disorders of bone density and structure, left thigh: Secondary | ICD-10-CM | POA: Diagnosis not present

## 2015-10-11 DIAGNOSIS — E041 Nontoxic single thyroid nodule: Secondary | ICD-10-CM | POA: Diagnosis not present

## 2015-11-09 DIAGNOSIS — J329 Chronic sinusitis, unspecified: Secondary | ICD-10-CM | POA: Diagnosis not present

## 2015-11-28 DIAGNOSIS — B9689 Other specified bacterial agents as the cause of diseases classified elsewhere: Secondary | ICD-10-CM | POA: Diagnosis not present

## 2015-11-28 DIAGNOSIS — J019 Acute sinusitis, unspecified: Secondary | ICD-10-CM | POA: Diagnosis not present

## 2015-12-13 DIAGNOSIS — M4712 Other spondylosis with myelopathy, cervical region: Secondary | ICD-10-CM | POA: Diagnosis not present

## 2015-12-13 DIAGNOSIS — M5030 Other cervical disc degeneration, unspecified cervical region: Secondary | ICD-10-CM | POA: Diagnosis not present

## 2015-12-13 DIAGNOSIS — M4806 Spinal stenosis, lumbar region: Secondary | ICD-10-CM | POA: Diagnosis not present

## 2015-12-13 DIAGNOSIS — M4802 Spinal stenosis, cervical region: Secondary | ICD-10-CM | POA: Diagnosis not present

## 2015-12-19 DIAGNOSIS — R51 Headache: Secondary | ICD-10-CM | POA: Diagnosis not present

## 2015-12-19 DIAGNOSIS — J323 Chronic sphenoidal sinusitis: Secondary | ICD-10-CM | POA: Diagnosis not present

## 2016-01-09 DIAGNOSIS — R51 Headache: Secondary | ICD-10-CM | POA: Diagnosis not present

## 2016-01-09 DIAGNOSIS — J323 Chronic sphenoidal sinusitis: Secondary | ICD-10-CM | POA: Diagnosis not present

## 2016-01-18 DIAGNOSIS — F064 Anxiety disorder due to known physiological condition: Secondary | ICD-10-CM | POA: Diagnosis not present

## 2016-01-18 DIAGNOSIS — I32 Pericarditis in diseases classified elsewhere: Secondary | ICD-10-CM | POA: Diagnosis not present

## 2016-01-18 DIAGNOSIS — I43 Cardiomyopathy in diseases classified elsewhere: Secondary | ICD-10-CM | POA: Diagnosis not present

## 2016-01-18 DIAGNOSIS — E559 Vitamin D deficiency, unspecified: Secondary | ICD-10-CM | POA: Diagnosis not present

## 2016-01-18 DIAGNOSIS — D649 Anemia, unspecified: Secondary | ICD-10-CM | POA: Diagnosis not present

## 2016-01-18 DIAGNOSIS — R5383 Other fatigue: Secondary | ICD-10-CM | POA: Diagnosis not present

## 2016-01-18 DIAGNOSIS — E039 Hypothyroidism, unspecified: Secondary | ICD-10-CM | POA: Diagnosis not present

## 2016-01-18 DIAGNOSIS — E7800 Pure hypercholesterolemia, unspecified: Secondary | ICD-10-CM | POA: Diagnosis not present

## 2016-01-23 DIAGNOSIS — E039 Hypothyroidism, unspecified: Secondary | ICD-10-CM | POA: Diagnosis not present

## 2016-01-23 DIAGNOSIS — I10 Essential (primary) hypertension: Secondary | ICD-10-CM | POA: Diagnosis not present

## 2016-01-23 DIAGNOSIS — I43 Cardiomyopathy in diseases classified elsewhere: Secondary | ICD-10-CM | POA: Diagnosis not present

## 2016-02-15 DIAGNOSIS — Z961 Presence of intraocular lens: Secondary | ICD-10-CM | POA: Diagnosis not present

## 2016-02-20 DIAGNOSIS — D225 Melanocytic nevi of trunk: Secondary | ICD-10-CM | POA: Diagnosis not present

## 2016-02-20 DIAGNOSIS — L738 Other specified follicular disorders: Secondary | ICD-10-CM | POA: Diagnosis not present

## 2016-02-20 DIAGNOSIS — L720 Epidermal cyst: Secondary | ICD-10-CM | POA: Diagnosis not present

## 2016-02-20 DIAGNOSIS — D224 Melanocytic nevi of scalp and neck: Secondary | ICD-10-CM | POA: Diagnosis not present

## 2016-02-20 DIAGNOSIS — L821 Other seborrheic keratosis: Secondary | ICD-10-CM | POA: Diagnosis not present

## 2016-02-20 DIAGNOSIS — D1801 Hemangioma of skin and subcutaneous tissue: Secondary | ICD-10-CM | POA: Diagnosis not present

## 2016-02-20 DIAGNOSIS — D2262 Melanocytic nevi of left upper limb, including shoulder: Secondary | ICD-10-CM | POA: Diagnosis not present

## 2016-02-20 DIAGNOSIS — Z85820 Personal history of malignant melanoma of skin: Secondary | ICD-10-CM | POA: Diagnosis not present

## 2016-02-20 DIAGNOSIS — L570 Actinic keratosis: Secondary | ICD-10-CM | POA: Diagnosis not present

## 2016-02-20 DIAGNOSIS — L814 Other melanin hyperpigmentation: Secondary | ICD-10-CM | POA: Diagnosis not present

## 2016-02-20 NOTE — Progress Notes (Signed)
Chief Complaint  Patient presents with  . Follow-up     History of Present Illness: 80 yo female with history of HLD but statin intolerance, GERD, hiatal hernia, fibromyalgia and mitral valve disease who is here today for cardiac follow up. I met her in March 2017. She had been followed by Dr. Ron Parker. I also see her husband Noma Quijas. Last echo July 2016 with normal LV systolic function, mild to moderate mitral regurgitation.   She is here today for follow up. She tells me today that she feels well. She has no chest pain or SOB. She is exercising three days per week at the [facility]. No near syncope, syncope or LE edema.   Primary Care Physician: Delia Chimes, NP  Past Medical History:  Diagnosis Date  . Anxiety    prev on prozac  . Chest pain    Felt to be noncardiac in the past  . Colon polyp   . Dyslipidemia   . Ejection fraction    EF 60%, echo, 2007, atrial septum bows left to right compatible with increased left atrial pressure  . Fibromyalgia   . GERD (gastroesophageal reflux disease)    Barrett's esophagus  . Hiatal hernia   . Lumbar spine pain    Complex lumbar spine surgery at [facility], 8119, complicated, MRSA, much of the appliance is removed, patient on chronic antibiotics  . Melanoma ([place])   . Mitral regurgitation    Mild, 2007,  /  moderate or severe echo, September, 2012, eccentric jet wrapping the left atrium, consider TEE for further assessment  . Mitral valve prolapse    Mild mitral regurgitation echo, 2007  . Osteoarthritis   . Scoliosis   . Sinusitis    Multiple sinus operations in the past  . Statin intolerance    As of 2009, no further attempts to use statins  . Thyroid nodule     Past Surgical History:  Procedure Laterality Date  . ABDOMINAL HYSTERECTOMY    . BACK SURGERY    . CHOLECYSTECTOMY    . FOOT SURGERY    . HERNIA REPAIR    . NASAL SINUS SURGERY      Current Outpatient Prescriptions  Medication Sig Dispense Refill  . ALPHA LIPOIC ACID PO  Take 1 tablet by mouth daily. Daily     . Biotin 10 MG TABS Take 1 tablet by mouth daily. Daily     . calcium carbonate (OS-CAL) 600 MG TABS Take 1,200 mg by mouth 2 (two) times daily with a meal.    . cholecalciferol (VITAMIN D) 1000 UNITS tablet Take 1,000 Units by mouth daily.    . fish oil-omega-3 fatty acids 1000 MG capsule Take 2 g by mouth daily.    Marland Kitchen gabapentin (NEURONTIN) 100 MG capsule Take 100 mg by mouth at bedtime.     . Ginger, Zingiber officinalis, (GINGER PO) Take 1 tablet by mouth daily.    Marland Kitchen levothyroxine (SYNTHROID, LEVOTHROID) 25 MCG tablet Take 2 tablets (50 mcg total) by mouth daily. 60 tablet 12  . LIDODERM 5 % Place onto the skin as needed (1-3 patches as needed for pain).     . metoprolol (LOPRESSOR) 50 MG tablet Take 50 mg by mouth in the am and 25 mg by mouth in the pm 135 tablet 3  . NON FORMULARY Take 1 capsule by mouth daily. Tumeric      Daily     . PREMARIN 0.45 MG tablet Take 0.45 mg by mouth daily.     Marland Kitchen  sulfamethoxazole-trimethoprim (BACTRIM DS) 800-160 MG per tablet Take 1 tablet by mouth 2 (two) times daily.     Marland Kitchen tretinoin (RETIN-A) 0.05 % cream Apply 1 application topically daily as needed (as directed per dermatologist).   3  . VOLTAREN 1 % GEL Apply 1 application topically as needed. pain     No current facility-administered medications for this visit.     Allergies  Allergen Reactions  . Fenofibric Acid Other (See Comments)    GI upset  . Nsaids     REACTION: No long term use  . Statins Other (See Comments)    Intolerant, myalgias Intolerant, myalgias  . Trilipix [Choline Fenofibrate]     GI upset    Social History   Social History  . Marital status: Married    Spouse name: N/A  . Number of children: N/A  . Years of education: N/A   Occupational History  . Not on file.   Social History Main Topics  . Smoking status: Never Smoker  . Smokeless tobacco: Never Used  . Alcohol use No  . Drug use: No  . Sexual activity: Not on  file   Other Topics Concern  . Not on file   Social History Narrative   From [place]   Married (second [phone]   3 kids, 2 of whom are local   Retired, Camera operator    Family History  Problem Relation Age of Onset  . GER disease Mother   . Dementia Mother   . Breast cancer Mother   . Heart disease Father     MI at 73  . Colon cancer Neg Hx     Review of Systems:  As stated in the HPI and otherwise negative.   BP 100/62   Pulse 65   Ht '4\' 9"'  (1.448 m)   Wt 145 lb (65.8 kg)   BMI 31.38 kg/m   Physical Examination: General: Well developed, well nourished, NAD  HEENT: OP clear, mucus membranes moist  SKIN: warm, dry. No rashes. Neuro: No focal deficits  Musculoskeletal: Muscle strength 5/5 all ext  Psychiatric: Mood and affect normal  Neck: No JVD, no carotid bruits, no thyromegaly, no lymphadenopathy.  Lungs:Clear bilaterally, no wheezes, rhonci, crackles Cardiovascular: Regular rate and rhythm. Soft systolic murmur. No gallops or rubs. Abdomen:Soft. Bowel sounds present. Non-tender.  Extremities: No lower extremity edema. Pulses are 2 + in the bilateral DP/PT.  EKG:  EKG is ordered today. The ekg ordered today demonstrates NSR, rate 65 bpm.   Recent Labs: No results found for requested labs within last 8760 hours.   Lipid Panel    Wt Readings from Last 3 Encounters:  02/21/16 145 lb (65.8 kg)  07/03/15 139 lb 6.4 oz (63.2 kg)  09/19/14 132 lb 3.2 oz (60 kg)     Other studies Reviewed: Additional studies/ records that were reviewed today include: . Review of the above records demonstrates:    Assessment and Plan:   1. Mitral regurgitation: Mild to moderate by echo July 2016. Will repeat echo July 2018.   2. Palpitations: These are rare. Will continue beta blocker   Current medicines are reviewed at length with the patient today.  The patient does not have concerns regarding medicines.  The following changes have been made:  no  change  Labs/ tests ordered today include:   Orders Placed This Encounter  Procedures  . EKG 12-Lead  . ECHOCARDIOGRAM COMPLETE    Disposition:   FU with me in  12  months  Signed, Lauree Chandler, MD 02/21/2016 9:56 AM    [facility], [place], [place]  [address] Phone: [phone]; Fax: [phone]

## 2016-02-21 ENCOUNTER — Encounter: Payer: Self-pay | Admitting: Cardiovascular Disease

## 2016-02-21 ENCOUNTER — Ambulatory Visit (INDEPENDENT_AMBULATORY_CARE_PROVIDER_SITE_OTHER): Payer: Medicare Other | Admitting: Cardiovascular Disease

## 2016-02-21 VITALS — BP 100/62 | HR 65 | Ht <= 58 in | Wt 145.0 lb

## 2016-02-21 DIAGNOSIS — R002 Palpitations: Secondary | ICD-10-CM

## 2016-02-21 DIAGNOSIS — I340 Nonrheumatic mitral (valve) insufficiency: Secondary | ICD-10-CM | POA: Diagnosis not present

## 2016-02-21 MED ORDER — METOPROLOL TARTRATE 50 MG PO TABS: ORAL_TABLET | ORAL | 3 refills | Status: DC

## 2016-02-21 NOTE — Patient Instructions (Signed)
Medication Instructions:  Your physician recommends that you continue on your current medications as directed. Please refer to the Current Medication list given to you today.   Labwork: none  Testing/Procedures: Your physician has requested that you have an echocardiogram. Echocardiography is a painless test that uses sound waves to create images of your heart. It provides your doctor with information about the size and shape of your heart and how well your heart's chambers and valves are working. This procedure takes approximately one hour. There are no restrictions for this procedure. To be done in July 2018.    Follow-Up: Your physician recommends that you schedule a follow-up appointment in: 12 months.  Please call our office in about 9 months to schedule this appointment.     Any Other Special Instructions Will Be Listed Below (If Applicable).     If you need a refill on your cardiac medications before your next appointment, please call your pharmacy.

## 2016-02-21 NOTE — Addendum Note (Signed)
Addended by: Thompson Grayer on: 02/21/2016 10:05 AM   Modules accepted: Orders

## 2016-03-04 ENCOUNTER — Ambulatory Visit (INDEPENDENT_AMBULATORY_CARE_PROVIDER_SITE_OTHER): Payer: Medicare Other | Admitting: Rheumatology

## 2016-03-04 ENCOUNTER — Ambulatory Visit: Payer: Self-pay | Admitting: Rheumatology

## 2016-03-04 ENCOUNTER — Other Ambulatory Visit: Payer: Self-pay | Admitting: Nurse Practitioner

## 2016-03-04 ENCOUNTER — Encounter: Payer: Self-pay | Admitting: Rheumatology

## 2016-03-04 VITALS — BP 106/64 | HR 66 | Resp 12 | Ht <= 58 in | Wt 147.0 lb

## 2016-03-04 DIAGNOSIS — M19041 Primary osteoarthritis, right hand: Secondary | ICD-10-CM | POA: Diagnosis not present

## 2016-03-04 DIAGNOSIS — M797 Fibromyalgia: Secondary | ICD-10-CM

## 2016-03-04 DIAGNOSIS — M19042 Primary osteoarthritis, left hand: Secondary | ICD-10-CM | POA: Diagnosis not present

## 2016-03-04 DIAGNOSIS — G4700 Insomnia, unspecified: Secondary | ICD-10-CM

## 2016-03-04 DIAGNOSIS — Z1231 Encounter for screening mammogram for malignant neoplasm of breast: Secondary | ICD-10-CM

## 2016-03-04 DIAGNOSIS — M8580 Other specified disorders of bone density and structure, unspecified site: Secondary | ICD-10-CM | POA: Diagnosis not present

## 2016-03-04 DIAGNOSIS — R5383 Other fatigue: Secondary | ICD-10-CM | POA: Diagnosis not present

## 2016-03-04 MED ORDER — GABAPENTIN 100 MG PO CAPS: 100.0000 mg | ORAL_CAPSULE | Freq: Three times a day (TID) | ORAL | 1 refills | Status: DC

## 2016-03-04 NOTE — Progress Notes (Signed)
Office Visit Note  Patient: [name] [name]             Date of Birth: 06-25-1935           MRN: [id number]             PCP: [name] Chimes, NP Referring: [name] Chimes, NP Visit Date: 03/04/2016 Occupation: @GUAROCC @    Subjective:  Follow-up Follow-up on fibromyalgia syndrome, osteopenia, osteoarthritis, chronic kidney disease  History of Present Illness: [name] [name] is a 80 y.o. female  Last seen in our office 08/31/2015.  Patient's fibromyalgia is rated 3 on a scale of 0-10. Overall her fibromyalgia is doing well. Patient's main complaint is her osteoarthritis. She has significant pain to her hands as well as her neck.  Patient has osteopenia based on her 09/30/2013 labs with the left femoral neck having a T score of -2.1. I her June 2017 T score is -2.3 of the right femoral neck.  She's had good relief with 3 class in the past and she may benefit from restarting on re-class. Will have her follow up with Dr. Estanislado [name] in a couple of months at discussed this in detail.  I reviewed the labs that she had during her physical exam done on 01/18/2016. Her labs are show creatinine at 1.10 and GFR at 48. Note that patient has a history of chronic kidney disease.    Activities of Daily Living:  Patient reports morning stiffness for 15 minutes.   Patient Reports nocturnal pain.  Difficulty dressing/grooming: Reports Difficulty climbing stairs: Reports Difficulty getting out of chair: Reports Difficulty using hands for taps, buttons, cutlery, and/or writing: Reports   Review of Systems  Constitutional: Positive for fatigue.  HENT: Negative for mouth sores and mouth dryness.   Eyes: Negative for dryness.  Respiratory: Negative for shortness of breath.   Gastrointestinal: Negative for constipation and diarrhea.  Musculoskeletal: Positive for myalgias and myalgias.  Skin: Negative for sensitivity to sunlight.  Psychiatric/Behavioral: Positive for sleep disturbance. Negative for  decreased concentration.    PMFS History:  Patient Active Problem List   Diagnosis Date Noted  . Osteopenia 03/04/2016  . Palpitations 09/19/2014  . Thyroid nodule, uninodular 09/18/2011  . Hypothyroid 08/19/2011  . Postmenopausal HRT (hormone replacement therapy) 08/13/2011  . Mitral regurgitation   . Lumbar spine pain   . Dyslipidemia   . Chest pain   . Statin intolerance   . Mitral valve prolapse   . Ejection fraction   . Fibromyalgia   . FIBROMYALGIA 12/31/2008  . ADENOMATOUS COLONIC POLYP 08/15/2007  . GERD 08/15/2007  . BARRETTS ESOPHAGUS 08/15/2007  . HIATAL HERNIA 08/15/2007  . OSTEOARTHRITIS, GENERALIZED 08/15/2007  . CATARACT EXTRACTION, HX OF 08/15/2007    Past Medical History:  Diagnosis Date  . Anxiety    prev on prozac  . Chest pain    Felt to be noncardiac in the past  . Colon polyp   . Dyslipidemia   . Ejection fraction    EF 60%, echo, 2007, atrial septum bows left to right compatible with increased left atrial pressure  . Fibromyalgia   . GERD (gastroesophageal reflux disease)    Barrett's esophagus  . Hiatal hernia   . Lumbar spine pain    Complex lumbar spine surgery at [facility], 0000000, complicated, MRSA, much of the appliance is removed, patient on chronic antibiotics  . Melanoma ([name] [name])   . Mitral regurgitation    Mild, 2007,  /  moderate or severe echo, September, 2012, eccentric jet  wrapping the left atrium, consider TEE for further assessment  . Mitral valve prolapse    Mild mitral regurgitation echo, 2007  . Osteoarthritis   . Scoliosis   . Sinusitis    Multiple sinus operations in the past  . Statin intolerance    As of 2009, no further attempts to use statins  . Thyroid nodule     Family History  Problem Relation Age of Onset  . GER disease Mother   . Dementia Mother   . Breast cancer Mother   . Heart disease Father     MI at 40  . Colon cancer Neg Hx    Past Surgical History:  Procedure Laterality Date  . ABDOMINAL HYSTERECTOMY     . BACK SURGERY    . CHOLECYSTECTOMY    . FOOT SURGERY    . HERNIA REPAIR    . NASAL SINUS SURGERY     Social History   Social History Narrative   From [name]   Married (second [phone]   3 kids, 2 of whom are local   Retired, Camera operator     Objective: Vital Signs: BP 106/64 (BP Location: Right Arm, Patient Position: Sitting, Cuff Size: Small)   Pulse 66   Resp 12   Ht 4\' 9"  (1.448 m)   Wt 147 lb (66.7 kg)   BMI 31.81 kg/m    Physical Exam  Constitutional: She is oriented to person, place, and time. She appears well-developed and well-nourished.  HENT:  Head: Normocephalic and atraumatic.  Eyes: EOM are normal. Pupils are equal, round, and reactive to light.  Cardiovascular: Normal rate, regular rhythm and normal heart sounds.  Exam reveals no gallop and no friction rub.   No murmur heard. Pulmonary/Chest: Effort normal and breath sounds normal. She has no wheezes. She has no rales.  Abdominal: Soft. Bowel sounds are normal. She exhibits no distension. There is no tenderness. There is no guarding. No hernia.  Musculoskeletal: Normal range of motion. She exhibits no edema, tenderness or deformity.  Lymphadenopathy:    She has no cervical adenopathy.  Neurological: She is alert and oriented to person, place, and time. Coordination normal.  Skin: Skin is warm and dry. Capillary refill takes less than 2 seconds. No rash noted.  Psychiatric: She has a normal mood and affect. Her behavior is normal.     Musculoskeletal Exam:  About 100 of abduction of bilateral shoulder joint otherwise good range of motion. Grip strength is decreased bilaterally Fiber myalgia tender points are all absent   CDAI Exam: CDAI Homunculus Exam:   Joint Counts:  CDAI Tender Joint count: 0 CDAI Swollen Joint count: 0   DIP PIP prominence bilaterally. No synovitis on examination.  Investigation: No additional findings.   Imaging: No results found.  Speciality  Comments: No specialty comments available.   Procedures:  No procedures performed Allergies: Fenofibric acid; Nsaids; Statins; and Trilipix [choline fenofibrate]   Assessment / Plan:     Visit Diagnoses: Fibromyalgia  Fatigue, unspecified type  Insomnia, unspecified type  Osteoarthritis of both hands, unspecified osteoarthritis type  Osteopenia, unspecified location - worsening over last 3 bone scans (2013 --> 2015 --> 2017);responded well to reclast in past and will offer reclast at visit in jan/feb 2018   Patient's fibromyalgia is doing well overall. She rates her discomfort as 3 on a scale of 0-10.  Her main complaint is the arthritis pain from osteoarthritis. She rates that pain is 8 on a scale of 0-10.  She also has osteopenia that has worsened from 2013 to 2015 to 2017. On 09/30/2013, her left femoral neck had a T score of -2.1. On June 2017, her right femoral neck had a T score of -2.3. As a result, I would like to offer are class after I discussed this with Dr. Estanislado [name]. We will bring patient back January/February 2018 to discuss this treatment option. She has been doing weightbearing exercise as tolerated, has enough calcium in her serum at 9.3 on October 2017 labs and her vitamin D is normal at 45.1.  She had a physical exam through her PCP and she had the following labs done: CMP with GFR/lipid panel/thyroid/vitamin D/CBC with differential. Please see those labs from lab core for full details.  Patient is requesting refill on the Neurontin 100 mg 1 by mouth 3 times a day; she is asking for a 90 day supply with a refill. I'm agreeable.  Patient has had surgery done in the past on her lumbar spine and she ended up contracting MRSA.  She is in need for C-spine surgery but she is afraid of contracting MRSA once again and is holding off on that surgery.  Patient had a recent physical exam through her PCP and had multiple labs done. I think she would benefit from restarting  are class but I would like to check her PTH, magnesium. Unfortunately since she had recent labs done I don't want there to be a financial burden on the patient and therefore I will wait until her follow-up visit in January/February 2018 and ordered these labs during that visit. I have scheduled her to see Dr. Estanislado [name] are 4 restart are class if appropriate. Note that the last 3 bone densities are showing worsening bone density  Orders: No orders of the defined types were placed in this encounter.  Meds ordered this encounter  Medications  . gabapentin (NEURONTIN) 100 MG capsule    Sig: Take 1 capsule (100 mg total) by mouth 3 (three) times daily.    Dispense:  180 capsule    Refill:  1    Order Specific Question:   Supervising Provider    Answer:   Bo Merino [phone]    Face-to-face time spent with patient was 30 minutes. 50% of time was spent in counseling and coordination of care.  Follow-Up Instructions: Return in about 6 months (around 09/01/2016) for Fibromyalga, OA Hands, Openia, CKD, Cervical spinal stenosis.   Eliezer Lofts, PA-C

## 2016-03-06 ENCOUNTER — Ambulatory Visit (INDEPENDENT_AMBULATORY_CARE_PROVIDER_SITE_OTHER): Payer: Self-pay

## 2016-03-06 ENCOUNTER — Encounter: Payer: Self-pay | Admitting: Rheumatology

## 2016-03-06 ENCOUNTER — Ambulatory Visit (INDEPENDENT_AMBULATORY_CARE_PROVIDER_SITE_OTHER): Payer: Medicare Other | Admitting: Rheumatology

## 2016-03-06 VITALS — BP 125/74 | HR 78

## 2016-03-06 DIAGNOSIS — M1711 Unilateral primary osteoarthritis, right knee: Secondary | ICD-10-CM | POA: Diagnosis not present

## 2016-03-06 DIAGNOSIS — M797 Fibromyalgia: Secondary | ICD-10-CM

## 2016-03-06 DIAGNOSIS — M1712 Unilateral primary osteoarthritis, left knee: Secondary | ICD-10-CM

## 2016-03-06 DIAGNOSIS — M25562 Pain in left knee: Secondary | ICD-10-CM | POA: Diagnosis not present

## 2016-03-06 DIAGNOSIS — M25561 Pain in right knee: Secondary | ICD-10-CM

## 2016-03-06 DIAGNOSIS — M170 Bilateral primary osteoarthritis of knee: Secondary | ICD-10-CM

## 2016-03-06 MED ORDER — TRIAMCINOLONE ACETONIDE 40 MG/ML IJ SUSP
40.0000 mg | INTRAMUSCULAR | Status: AC | PRN
  Administered 2016-03-06: 40 mg via INTRA_ARTICULAR

## 2016-03-06 MED ORDER — LIDOCAINE HCL 1 % IJ SOLN
1.5000 mL | INTRAMUSCULAR | Status: AC | PRN
  Administered 2016-03-06: 1.5 mL

## 2016-03-06 NOTE — Progress Notes (Signed)
Office Visit Note  Patient: [name] [name]             Date of Birth: 23-Jun-1935           MRN: [id number]             PCP: [name] Chimes, NP Referring: [name] Chimes, NP Visit Date: 03/06/2016 Occupation: @GUAROCC @    Subjective:  Pain of the Right Knee   History of Present Illness: [name] [name] is a 80 y.o. female  Patient was recently seen and she is having severe right knee joint pain today and is presenting for cortisone injection.  On the last visit she did have some warmth but she did not have this much pain.  I would like to give the patient a cortisone injection today to relieve her flare  Her blood pressure is normal today   Activities of Daily Living:  Patient reports morning stiffness for 60 minutes.   Patient Reports nocturnal pain.  Difficulty dressing/grooming: Reports Difficulty climbing stairs: Reports Difficulty getting out of chair: Reports Difficulty using hands for taps, buttons, cutlery, and/or writing: Reports   Review of Systems  Constitutional: Positive for fatigue.  HENT: Negative for mouth sores and mouth dryness.   Eyes: Negative for dryness.  Respiratory: Negative for shortness of breath.   Gastrointestinal: Negative for constipation and diarrhea.  Musculoskeletal: Positive for myalgias and myalgias.  Skin: Negative for sensitivity to sunlight.  Psychiatric/Behavioral: Positive for sleep disturbance. Negative for decreased concentration.    PMFS History:  Patient Active Problem List   Diagnosis Date Noted  . Osteopenia 03/04/2016  . Palpitations 09/19/2014  . Thyroid nodule, uninodular 09/18/2011  . Hypothyroid 08/19/2011  . Postmenopausal HRT (hormone replacement therapy) 08/13/2011  . Mitral regurgitation   . Lumbar spine pain   . Dyslipidemia   . Chest pain   . Statin intolerance   . Mitral valve prolapse   . Ejection fraction   . Fibromyalgia   . FIBROMYALGIA 12/31/2008  . ADENOMATOUS COLONIC POLYP 08/15/2007  . GERD  08/15/2007  . BARRETTS ESOPHAGUS 08/15/2007  . HIATAL HERNIA 08/15/2007  . OSTEOARTHRITIS, GENERALIZED 08/15/2007  . CATARACT EXTRACTION, HX OF 08/15/2007    Past Medical History:  Diagnosis Date  . Anxiety    prev on prozac  . Chest pain    Felt to be noncardiac in the past  . Colon polyp   . Dyslipidemia   . Ejection fraction    EF 60%, echo, 2007, atrial septum bows left to right compatible with increased left atrial pressure  . Fibromyalgia   . GERD (gastroesophageal reflux disease)    Barrett's esophagus  . Hiatal hernia   . Lumbar spine pain    Complex lumbar spine surgery at [facility], 0000000, complicated, MRSA, much of the appliance is removed, patient on chronic antibiotics  . Melanoma ([place])   . Mitral regurgitation    Mild, 2007,  /  moderate or severe echo, September, 2012, eccentric jet wrapping the left atrium, consider TEE for further assessment  . Mitral valve prolapse    Mild mitral regurgitation echo, 2007  . Osteoarthritis   . Scoliosis   . Sinusitis    Multiple sinus operations in the past  . Statin intolerance    As of 2009, no further attempts to use statins  . Thyroid nodule     Family History  Problem Relation Age of Onset  . GER disease Mother   . Dementia Mother   . Breast cancer Mother   .  Heart disease Father     MI at 33  . Colon cancer Neg Hx    Past Surgical History:  Procedure Laterality Date  . ABDOMINAL HYSTERECTOMY    . BACK SURGERY    . CHOLECYSTECTOMY    . FOOT SURGERY    . HERNIA REPAIR    . NASAL SINUS SURGERY     Social History   Social History Narrative   From [place]   Married (second [phone]   3 kids, 2 of whom are local   Retired, Camera operator     Objective: Vital Signs: BP 125/74   Pulse 78    Physical Exam  Constitutional: She is oriented to person, place, and time. She appears well-developed and well-nourished.  HENT:  Head: Normocephalic and atraumatic.  Eyes: EOM are normal. Pupils are equal,  round, and reactive to light.  Cardiovascular: Normal rate, regular rhythm and normal heart sounds.  Exam reveals no gallop and no friction rub.   No murmur heard. Pulmonary/Chest: Effort normal and breath sounds normal. She has no wheezes. She has no rales.  Abdominal: Soft. Bowel sounds are normal. She exhibits no distension. There is no tenderness. There is no guarding. No hernia.  Musculoskeletal: Normal range of motion. She exhibits no edema, tenderness or deformity.       Legs: Lymphadenopathy:    She has no cervical adenopathy.  Neurological: She is alert and oriented to person, place, and time. Coordination normal.  Skin: Skin is warm and dry. Capillary refill takes less than 2 seconds. No rash noted.  Psychiatric: She has a normal mood and affect. Her behavior is normal.     Musculoskeletal Exam:  Full range of motion of all joints Grip strength is equal and strong bilaterally  CDAI Exam: CDAI Homunculus Exam:   Tenderness:  RLE: tibiofemoral  Joint Counts:  CDAI Tender Joint count: 1 CDAI Swollen Joint count: 0   No synovitis on exam  Investigation: No additional findings.   Imaging: Xr Knee 3 View Left  Result Date: 03/06/2016 Left knee shows moderate/severe medial compartment narrowing and moderate lateral compartment narrowing; no CPPD. Sunrise view shows decreased joint space of the patella but patella alignment shifted laterally Lateral view shows moderate joint space narrowing of the patella.  Xr Knee 3 View Right  Result Date: 03/06/2016 Right knee shows moderate/severe medial compartment narrowing and moderate lateral compartment narrowing; no CPPD. Sunrise view shows decreased joint space of the patella but proper alignment Lateral view shows moderate joint space narrowing of the patella.   Speciality Comments: No specialty comments available.    Procedures:  Large Joint Inj Date/Time: 03/06/2016 2:33 PM Performed by: [name] [name] Authorized by: [name] [name]   Consent Given by:  Patient Site marked: the procedure site was marked   Timeout: prior to procedure the correct patient, procedure, and site was verified   Indications:  Pain and joint swelling Location:  Knee Site:  R knee Prep: patient was prepped and draped in usual sterile fashion   Needle Size:  27 G Needle Length:  1.5 inches Approach:  Medial Ultrasound Guidance: No   Fluoroscopic Guidance: No   Arthrogram: No   Medications:  1.5 mL lidocaine 1 %; 40 mg triamcinolone acetonide 40 MG/ML Aspiration Attempted: Yes   Aspirate amount (mL):  0 Patient tolerance:  Patient tolerated the procedure well with no immediate complications  Right knee joint was prepped in usual sterile fashion injected with 40 mg of Kenalog mixed  with 1-1/2 mL to 1% lidocaine without epinephrine. Patient tolerated procedure well. There is no complication   Allergies: Fenofibric acid; Nsaids; Statins; and Trilipix [choline fenofibrate]   Assessment / Plan:     Visit Diagnoses: Acute pain of both knees - Plan: XR KNEE 3 VIEW LEFT, XR KNEE 3 VIEW RIGHT, Care order/instruction  Osteoarthritis of both knees, unspecified osteoarthritis type  Fibromyalgia   Patient would benefit from Visco supplementation. I have ordered a message to Ivin Booty to apply for Visco for this patient starting January 2018. Either Visco or Hyalgan would be acceptable for this patient (whichever the insurance company will approve) I do not think the patient would benefit from single injection of Visco.  X-ray of bilateral knees were reviewed. Amy to call the patient with the results of x-rays . patient has moderate to severe medial compartment narrowing bilaterally .   Orders: Orders Placed This Encounter  Procedures  . Large Joint Injection/Arthrocentesis  . XR KNEE 3 VIEW LEFT  . XR KNEE 3 VIEW RIGHT  . Care order/instruction   No orders of the defined types were placed in this  encounter.   Face-to-face time spent with patient was 30 minutes. 50% of time was spent in counseling and coordination of care.  Follow-Up Instructions: Return in about 5 months (around 08/04/2016), or right knee pain, bilateral knee oa, .   [name] Lofts, PA-C

## 2016-03-13 ENCOUNTER — Telehealth: Payer: Self-pay | Admitting: Radiology

## 2016-03-13 NOTE — Telephone Encounter (Signed)
Patient wants Hyalgan/ Euflexxa, told her will be after Jan  Bilateral knees she states this is fine.

## 2016-03-13 NOTE — Telephone Encounter (Signed)
-----   Message from Eliezer Lofts, [place] sent at 03/06/2016  2:25 PM EST ----- Please tell patient: X-ray of bilateral knee shows moderate/severe medial compartment narrowing consistent with moderate/severe osteoarthritis.  We will apply for prior authorization of Visco supplementation in January 2018. The Euflex or Hyalgan are acceptable but any Visco supplementation at [employer] is agreeable to from these 2 will be acceptable.

## 2016-04-01 DIAGNOSIS — T8579XD Infection and inflammatory reaction due to other internal prosthetic devices, implants and grafts, subsequent encounter: Secondary | ICD-10-CM | POA: Diagnosis not present

## 2016-04-11 ENCOUNTER — Ambulatory Visit
Admission: RE | Admit: 2016-04-11 | Discharge: 2016-04-11 | Disposition: A | Discharge status: Home / Self Care | Payer: Medicare Other | Source: Ambulatory Visit | Attending: Nurse Practitioner | Admitting: Nurse Practitioner

## 2016-04-11 DIAGNOSIS — Z1231 Encounter for screening mammogram for malignant neoplasm of breast: Secondary | ICD-10-CM

## 2016-05-21 DIAGNOSIS — J323 Chronic sphenoidal sinusitis: Secondary | ICD-10-CM | POA: Diagnosis not present

## 2016-05-21 DIAGNOSIS — R51 Headache: Secondary | ICD-10-CM | POA: Diagnosis not present

## 2016-06-05 ENCOUNTER — Other Ambulatory Visit: Payer: Self-pay | Admitting: Family Medicine

## 2016-06-05 DIAGNOSIS — E039 Hypothyroidism, unspecified: Secondary | ICD-10-CM

## 2016-06-11 ENCOUNTER — Ambulatory Visit: Payer: Medicare Other | Admitting: Primary Care

## 2016-06-11 DIAGNOSIS — L814 Other melanin hyperpigmentation: Secondary | ICD-10-CM | POA: Diagnosis not present

## 2016-06-11 DIAGNOSIS — L821 Other seborrheic keratosis: Secondary | ICD-10-CM | POA: Diagnosis not present

## 2016-06-12 ENCOUNTER — Encounter: Payer: Self-pay | Admitting: Primary Care

## 2016-06-12 ENCOUNTER — Ambulatory Visit (INDEPENDENT_AMBULATORY_CARE_PROVIDER_SITE_OTHER): Payer: Medicare Other | Admitting: Primary Care

## 2016-06-12 VITALS — Ht <= 58 in | Wt 144.1 lb

## 2016-06-12 DIAGNOSIS — M8580 Other specified disorders of bone density and structure, unspecified site: Secondary | ICD-10-CM | POA: Diagnosis not present

## 2016-06-12 DIAGNOSIS — R002 Palpitations: Secondary | ICD-10-CM | POA: Diagnosis not present

## 2016-06-12 DIAGNOSIS — Z79890 Hormone replacement therapy (postmenopausal): Secondary | ICD-10-CM | POA: Diagnosis not present

## 2016-06-12 DIAGNOSIS — M545 Low back pain, unspecified: Secondary | ICD-10-CM

## 2016-06-12 DIAGNOSIS — E041 Nontoxic single thyroid nodule: Secondary | ICD-10-CM

## 2016-06-12 DIAGNOSIS — E039 Hypothyroidism, unspecified: Secondary | ICD-10-CM | POA: Diagnosis not present

## 2016-06-12 DIAGNOSIS — E785 Hyperlipidemia, unspecified: Secondary | ICD-10-CM | POA: Diagnosis not present

## 2016-06-12 MED ORDER — LEVOTHYROXINE SODIUM 50 MCG PO TABS: ORAL_TABLET | ORAL | 1 refills | Status: DC

## 2016-06-12 MED ORDER — FENOFIBRATE MICRONIZED 134 MG PO CAPS: 134.0000 mg | ORAL_CAPSULE | Freq: Every day | ORAL | 2 refills | Status: DC

## 2016-06-12 MED ORDER — ESTROGENS CONJUGATED 0.3 MG PO TABS: 0.3000 mg | ORAL_TABLET | Freq: Every day | ORAL | 0 refills | Status: DC

## 2016-06-12 NOTE — Assessment & Plan Note (Signed)
Managed on fenofibrate 134 mg, statin intolerance with myalgias. Lipid panel from 01/2016 with slight elevation in triglycerides, otherwise unremarkable. Continue to monitor.

## 2016-06-12 NOTE — Assessment & Plan Note (Signed)
Stable on levothyroxine 50 mcg, continue same. TSH in 01/2016 stable. Refill provided today.

## 2016-06-12 NOTE — Progress Notes (Signed)
Pre visit review using our clinic review tool, if applicable. No additional management support is needed unless otherwise documented below in the visit note. 

## 2016-06-12 NOTE — Patient Instructions (Signed)
We've reduced your dose of Premarin from 0.45 mg to 0.3 mg. Take 1 tablet by mouth once daily.   I sent refills of fenofibrate and levothyroxine to your pharmacy.  Please schedule a physical with me in October 2018. You may also schedule a lab only appointment 3-4 days prior. We will discuss your lab results in detail during your physical.  It was a pleasure to meet you today! Please don't hesitate to call me with any questions. Welcome to [facility]!

## 2016-06-12 NOTE — Assessment & Plan Note (Signed)
Managed on Premarin 0.45 mg for 20 years. Will trial dose reduction to 0.3 mg with a goal to wean off very slowly. Will check on her in 2 months, next step to take 0.3 mg every other day. Continue calcium with vitamin D for osteopenia.

## 2016-06-12 NOTE — Assessment & Plan Note (Signed)
History of numerous back surgeries, follows with orthopedics through [facility].

## 2016-06-12 NOTE — Assessment & Plan Note (Signed)
Managed on Premarin 0.45 mg, calcium with vitamin D. Will trial dose reduction in Premarin and slowly wean off. Will find recent bone density scan.

## 2016-06-12 NOTE — Assessment & Plan Note (Signed)
Stable on metoprolol, no recent chest pain. Continue same.

## 2016-06-12 NOTE — Assessment & Plan Note (Signed)
Managed on gabapentin and voltaren gel, follows with rheumatology. Overall feels well managed. History of numerous back surgeries.

## 2016-06-12 NOTE — Assessment & Plan Note (Signed)
Last [place] in June 2017 without changes. Will repeat in June 2019.

## 2016-06-12 NOTE — Progress Notes (Signed)
Subjective:    Patient ID: [name] [name], female    DOB: 1935-04-29, 81 y.o.   MRN: [id number]  HPI  [name] is a 81 year old female who presents today to establish care and discuss the problems mentioned below. Will obtain old records. Her last physical was in October 2017.  1) Hyperlipidemia: Currently managed on fenofibrate 134 mg. Her last lipid panel was with mild elevation in triglycerides in October 2017. She exercises at the [facility] three times weekly and endorses a fair diet.  2) Hypothyroidism: Currently managed on levothyroxine 50 mcg. Her last TSH was normal in October 2017. She does have a known thyroid nodule, last several  Ultrasounds have shown no change in growth. Last thyroid ultrasound was in June 2017.   3) Arthritis: Located to hands, feet, knees, shoulders, spine. She is managed on gabapentin three times daily and Voltaren Gel. She is following with Rheumatology every 6 months. History of 5 back operations. She follows with [facility] for epidural injections.  4) Tachycardia/MVP/MR: Currently managed on metoprolol 50 mg every AM and 25 mg every PM. She follows with Cardiology.   5) Nevi/Abnormal Skin Growth: Currently following with Dermatology, managed on tretinoin 0.05% cream.   6) HRT: Currently managed on Premarin 0.45 mg tablets for which she's taken for the past 20 years. She's never had a dose reduction or tried to wean off of this medication. She takes calcium with vitamin D for osteopenia.   Review of Systems  Eyes: Negative for visual disturbance.  Respiratory: Negative for shortness of breath.   Cardiovascular: Negative for chest pain and palpitations.  Genitourinary: Negative for vaginal bleeding.  Skin:       Recent nevus removed  Neurological: Negative for dizziness and headaches.       Past Medical History:  Diagnosis Date  . Anxiety    prev on prozac  . Chest pain    Felt to be noncardiac in the past  . Colon polyp   . Dyslipidemia   .  Ejection fraction    EF 60%, echo, 2007, atrial septum bows left to right compatible with increased left atrial pressure  . Fibromyalgia   . GERD (gastroesophageal reflux disease)    Barrett's esophagus  . Hiatal hernia   . Lumbar spine pain    Complex lumbar spine surgery at [facility], 0000000, complicated, MRSA, much of the appliance is removed, patient on chronic antibiotics  . Melanoma ([place])   . Mitral regurgitation    Mild, 2007,  /  moderate or severe echo, September, 2012, eccentric jet wrapping the left atrium, consider TEE for further assessment  . Mitral valve prolapse    Mild mitral regurgitation echo, 2007  . Osteoarthritis   . Scoliosis   . Sinusitis    Multiple sinus operations in the past  . Statin intolerance    As of 2009, no further attempts to use statins  . Thyroid nodule      Social History   Social History  . Marital status: Married    Spouse name: N/A  . Number of children: N/A  . Years of education: N/A   Occupational History  . Not on file.   Social History Main Topics  . Smoking status: Never Smoker  . Smokeless tobacco: Never Used  . Alcohol use No  . Drug use: No  . Sexual activity: Not on file   Other Topics Concern  . Not on file   Social History Narrative  From [place]   Married (second [phone]   3 kids, 2 of whom are local   Retired, Camera operator    Past Surgical History:  Procedure Laterality Date  . ABDOMINAL HYSTERECTOMY    . BACK SURGERY    . CHOLECYSTECTOMY    . FOOT SURGERY    . HERNIA REPAIR    . NASAL SINUS SURGERY      Family History  Problem Relation Age of Onset  . GER disease Mother   . Dementia Mother   . Breast cancer Mother   . Heart disease Father     MI at 75  . Colon cancer Neg Hx     Allergies  Allergen Reactions  . Fenofibric Acid Other (See Comments)    GI upset  . Nsaids     REACTION: No long term use  . Statins Other (See Comments)    Intolerant, myalgias Intolerant, myalgias  .  Trilipix [Choline Fenofibrate]     GI upset    Current Outpatient Prescriptions on File Prior to Visit  Medication Sig Dispense Refill  . ALPHA LIPOIC ACID PO Take 1 tablet by mouth daily. Daily     . Biotin 10 MG TABS Take 1 tablet by mouth daily. Daily     . calcium carbonate (OS-CAL) 600 MG TABS Take 1,200 mg by mouth 2 (two) times daily with a meal.    . cholecalciferol (VITAMIN D) 1000 UNITS tablet Take 1,000 Units by mouth daily.    . fish oil-omega-3 fatty acids 1000 MG capsule Take 2 g by mouth daily.    Marland Kitchen gabapentin (NEURONTIN) 100 MG capsule Take 1 capsule (100 mg total) by mouth 3 (three) times daily. 180 capsule 1  . Ginger, Zingiber officinalis, (GINGER PO) Take 1 tablet by mouth daily.    Marland Kitchen LIDODERM 5 % Place onto the skin as needed (1-3 patches as needed for pain).     . metoprolol (LOPRESSOR) 50 MG tablet Take 50 mg by mouth in the am and 25 mg by mouth in the pm 135 tablet 3  . NON FORMULARY Take 1 capsule by mouth daily. Tumeric      Daily     . sulfamethoxazole-trimethoprim (BACTRIM DS) 800-160 MG per tablet Take 1 tablet by mouth 2 (two) times daily.     Marland Kitchen tretinoin (RETIN-A) 0.05 % cream Apply 1 application topically daily as needed (as directed per dermatologist).   3  . VOLTAREN 1 % GEL Apply 1 application topically as needed. pain     No current facility-administered medications on file prior to visit.     Ht 4\' 9"  (1.448 m)   Wt 144 lb 1.9 oz (65.4 kg)   BMI 31.19 kg/m    Objective:   Physical Exam  Constitutional: She appears well-nourished.  Neck: Neck supple. No thyromegaly present.  Cardiovascular: Normal rate and regular rhythm.   Pulmonary/Chest: Effort normal and breath sounds normal.  Skin: Skin is warm and dry.  Psychiatric: She has a normal mood and affect.          Assessment & Plan:

## 2016-06-19 DIAGNOSIS — J323 Chronic sphenoidal sinusitis: Secondary | ICD-10-CM | POA: Diagnosis not present

## 2016-06-19 DIAGNOSIS — R51 Headache: Secondary | ICD-10-CM | POA: Diagnosis not present

## 2016-07-08 ENCOUNTER — Telehealth: Payer: Self-pay | Admitting: Rheumatology

## 2016-07-08 NOTE — Telephone Encounter (Signed)
Patient is wanting to speak to someone about a letter she received from [facility] about a medication that Mr. Carlyon Shadow had ordered for her. (she would not give name of medication, just said it was for her knee). Please call patient.

## 2016-07-08 NOTE — Telephone Encounter (Signed)
Patient states she received a letter from [facility] regarding the Euflexxa not being cover through her insurance and theat they would not be able to ship it. They provided her with a number to call them. Patient states she contacted thema nd the advised her that the prescription would have to be order through [facility].

## 2016-07-24 DIAGNOSIS — J310 Chronic rhinitis: Secondary | ICD-10-CM | POA: Insufficient documentation

## 2016-07-24 DIAGNOSIS — J323 Chronic sphenoidal sinusitis: Secondary | ICD-10-CM | POA: Insufficient documentation

## 2016-07-31 DIAGNOSIS — Z8614 Personal history of Methicillin resistant Staphylococcus aureus infection: Secondary | ICD-10-CM | POA: Insufficient documentation

## 2016-07-31 DIAGNOSIS — A4902 Methicillin resistant Staphylococcus aureus infection, unspecified site: Secondary | ICD-10-CM | POA: Insufficient documentation

## 2016-08-01 ENCOUNTER — Other Ambulatory Visit (INDEPENDENT_AMBULATORY_CARE_PROVIDER_SITE_OTHER): Payer: Self-pay | Admitting: *Deleted

## 2016-08-01 NOTE — Telephone Encounter (Signed)
Pending per [facility] [phone]

## 2016-08-02 DIAGNOSIS — Z01818 Encounter for other preprocedural examination: Secondary | ICD-10-CM | POA: Diagnosis not present

## 2016-08-02 DIAGNOSIS — M26602 Left temporomandibular joint disorder, unspecified: Secondary | ICD-10-CM | POA: Diagnosis not present

## 2016-08-02 DIAGNOSIS — J310 Chronic rhinitis: Secondary | ICD-10-CM | POA: Diagnosis not present

## 2016-08-02 DIAGNOSIS — J323 Chronic sphenoidal sinusitis: Secondary | ICD-10-CM | POA: Diagnosis not present

## 2016-08-07 ENCOUNTER — Telehealth: Payer: Self-pay | Admitting: Primary Care

## 2016-08-07 NOTE — Telephone Encounter (Signed)
Message left for patient to return my call.  

## 2016-08-07 NOTE — Telephone Encounter (Signed)
-----   Message from [name], NP sent at 06/12/2016  9:41 AM EST ----- Regarding: HRT Please check on patient since we've reduced her Premarin to 0.3 mg.

## 2016-08-08 NOTE — Telephone Encounter (Signed)
Spoken to patient. She stated that she just pick up the Rx for the 0.30 mg. She just wanted to finished the 90 days supply of the 0.45 mg. She will call and let us know after she takes it a while.

## 2016-08-09 ENCOUNTER — Other Ambulatory Visit: Payer: Self-pay | Admitting: Rheumatology

## 2016-08-09 NOTE — Telephone Encounter (Signed)
03/06/16 last visit  09/03/16 next visit  Ok to refill per Dr Estanislado Pandy

## 2016-08-09 NOTE — Telephone Encounter (Signed)
Noted  

## 2016-08-21 NOTE — Telephone Encounter (Signed)
In folder, pending PA

## 2016-08-22 NOTE — Telephone Encounter (Signed)
IC SP and they have stopped working on this, it is a dead case, insurance does not cover Euflexxa.  IC pt and advised.  I will call [employer] and see what hyaluronic injections they do cover and then followup with the patient.

## 2016-08-23 NOTE — Progress Notes (Signed)
Office Visit Note  Patient: [name] [name]             Date of Birth: 08-Jun-1935           MRN: [id number]             PCP: [name], NP Referring: Delia Chimes, NP Visit Date: 09/03/2016 Occupation: @GUAROCC @    Subjective:  Medication Management,   History of Present Illness: [name] [name] is a 81 y.o. female with history of fibromyalgia osteoarthritis and disc disease. She states she's been having increased pain and discomfort in multiple joints. Her neck pain continues to be a problem. She states she's not related to have a surgery as her past experience with back surgery was not good. She's been having increased pain in her left shoulder. She continues to have some discomfort in her hands and knee joints due to underlying osteoarthritis she also complains of discomfort in her feet. She states the pain from fibromyalgia is not as severe as from osteoarthritis. She describes her pain on scale of 0-10 about 7-8 from osteoarthritis.   Activities of Daily Living:  Patient reports morning stiffness for 10 minutes.   Patient Reports nocturnal pain.  Difficulty dressing/grooming: Denies Difficulty climbing stairs: Reports Difficulty getting out of chair: Reports Difficulty using hands for taps, buttons, cutlery, and/or writing: Reports   Review of Systems  Constitutional: Positive for fatigue. Negative for night sweats, weight gain, weight loss and weakness.  HENT: Negative for mouth sores, trouble swallowing, trouble swallowing, mouth dryness and nose dryness.   Eyes: Negative for pain, redness, visual disturbance and dryness.  Respiratory: Negative for cough, shortness of breath and difficulty breathing.   Cardiovascular: Negative for chest pain, palpitations, hypertension, irregular heartbeat and swelling in legs/feet.  Gastrointestinal: Negative for blood in stool, constipation and diarrhea.  Endocrine: Negative for increased urination.  Genitourinary: Negative for  vaginal dryness.  Musculoskeletal: Positive for arthralgias, joint pain, myalgias, morning stiffness and myalgias. Negative for joint swelling, muscle weakness and muscle tenderness.  Skin: Negative for color change, rash, hair loss, skin tightness, ulcers and sensitivity to sunlight.  Allergic/Immunologic: Negative for susceptible to infections.  Neurological: Negative for dizziness, memory loss and night sweats.  Hematological: Negative for swollen glands.  Psychiatric/Behavioral: Positive for sleep disturbance. Negative for depressed mood. The patient is not nervous/anxious.     PMFS History:  Patient Active Problem List   Diagnosis Date Noted  . Other fatigue 09/03/2016  . Primary insomnia 09/03/2016  . Primary osteoarthritis of both knees 09/03/2016  . DJD (degenerative joint disease), cervical 09/03/2016  . Osteopenia 03/04/2016  . Palpitations 09/19/2014  . Thyroid nodule, uninodular 09/18/2011  . Hypothyroid 08/19/2011  . Postmenopausal HRT (hormone replacement therapy) 08/13/2011  . Mitral regurgitation   . Lumbar spine pain   . Dyslipidemia   . Chest pain   . Statin intolerance   . Mitral valve prolapse   . Ejection fraction   . Fibromyalgia   . ADENOMATOUS COLONIC POLYP 08/15/2007  . GERD 08/15/2007  . BARRETTS ESOPHAGUS 08/15/2007  . HIATAL HERNIA 08/15/2007  . Osteoarthritis 08/15/2007  . CATARACT EXTRACTION, HX OF 08/15/2007    Past Medical History:  Diagnosis Date  . Anxiety    prev on prozac  . Chest pain    Felt to be noncardiac in the past  . Colon polyp   . Dyslipidemia   . Ejection fraction    EF 60%, echo, 2007, atrial septum bows left to right  compatible with increased left atrial pressure  . Fibromyalgia   . GERD (gastroesophageal reflux disease)    Barrett's esophagus  . Hiatal hernia   . Lumbar spine pain    Complex lumbar spine surgery at [facility], 0630, complicated, MRSA, much of the appliance is removed, patient on chronic antibiotics  .  Melanoma ([place])   . Mitral regurgitation    Mild, 2007,  /  moderate or severe echo, September, 2012, eccentric jet wrapping the left atrium, consider TEE for further assessment  . Mitral valve prolapse    Mild mitral regurgitation echo, 2007  . Osteoarthritis   . Scoliosis   . Sinusitis    Multiple sinus operations in the past  . Statin intolerance    As of 2009, no further attempts to use statins  . Thyroid nodule     Family History  Problem Relation Age of Onset  . GER disease Mother   . Dementia Mother   . Breast cancer Mother   . Heart disease Father        MI at 58  . Colon cancer Neg Hx    Past Surgical History:  Procedure Laterality Date  . ABDOMINAL HYSTERECTOMY    . BACK SURGERY    . CHOLECYSTECTOMY    . FOOT SURGERY    . HERNIA REPAIR    . NASAL SINUS SURGERY     Social History   Social History Narrative   From [place]   Married (second [phone]   3 kids, 2 of whom are local   Retired, Camera operator     Objective: Vital Signs: BP 130/78   Pulse 78   Resp 14   Ht 4\' 9"  (1.448 m)   Wt 146 lb (66.2 kg)   BMI 31.59 kg/m    Physical Exam  Constitutional: She is oriented to person, place, and time. She appears well-developed and well-nourished.  HENT:  Head: Normocephalic and atraumatic.  Eyes: Conjunctivae and EOM are normal.  Neck: Normal range of motion.  Cardiovascular: Normal rate, regular rhythm, normal heart sounds and intact distal pulses.   Pulmonary/Chest: Effort normal and breath sounds normal.  Abdominal: Soft. Bowel sounds are normal.  Lymphadenopathy:    She has no cervical adenopathy.  Neurological: She is alert and oriented to person, place, and time.  Skin: Skin is warm and dry. Capillary refill takes less than 2 seconds.  Psychiatric: She has a normal mood and affect. Her behavior is normal.  Nursing note and vitals reviewed.    Musculoskeletal Exam: C-spine limited range of motion. Lumbar spine very limited range of  motion due to fusion. She is painful range of motion of her left shoulder. Elbow joints wrist joints are good range of motion. She has DIP PIP thickening with some inflammatory changes in her left fourth PIP. This is consistent with severe osteoarthritis. She is good range of motion of her hip joints. She is crepitus and discomfort range of motion of her knee joints without any warmth swelling or effusion. She also has ostial changes in her feet.  CDAI Exam: No CDAI exam completed.    Investigation: No additional findings.   Imaging: No results found.  Speciality Comments: No specialty comments available.    Procedures:  Large Joint Inj Date/Time: 09/03/2016 10:02 AM Performed by: Bo Merino Authorized by: Bo Merino   Consent Given by:  Patient Site marked: the procedure site was marked   Timeout: prior to procedure the correct patient, procedure, and site  was verified   Indications:  Pain Location:  Shoulder Site:  L glenohumeral Prep: patient was prepped and draped in usual sterile fashion   Needle Size:  27 G Needle Length:  1.5 inches Approach:  Posterior Ultrasound Guidance: No   Fluoroscopic Guidance: No   Arthrogram: No   Medications:  1 mL lidocaine 1 %; 40 mg triamcinolone acetonide 40 MG/ML Aspiration Attempted: Yes   Aspirate amount (mL):  0 Patient tolerance:  Patient tolerated the procedure well with no immediate complications   Allergies: Fenofibric acid; Nsaids; Statins; and Trilipix [choline fenofibrate]   Assessment / Plan:     Visit Diagnoses: Fibromyalgia: She continues to have some generalized pain and positive tender points and discomfort.  Other fatigue: She does have some fatigue due to underlying insomnia.  Primary insomnia: Her insomnia is manageable with the medications.  Primary osteoarthritis of both hands: She has severe inflammatory osteoarthritis involving her PIP/DIP joints.  Primary osteoarthritis of both knees: She  continues to have discomfort in her knee joints due to underlying osteoarthritis. She is scheduled to have [place] supplement injections in July.  Pain in joint of left shoulder: She had pain and discomfort in her left shoulder. After informed consent was obtained left shoulder joint was injected with cortisone as described above. She tolerate the procedure well.  Osteopenia of multiple sites - T score -2.3 09/2015. Need for regular exercise calcium and vitamin D was discussed. We will repeat bone density again in 2019.  DJD (degenerative joint disease), cervical - Worst level C6-7: She does have chronic pain she's concerned about having surgery as she had previous MRSA after her lumbar spine surgery.  DDD lumbar spine - status post fusion T11-S1 with iliac the screws 2012 by Dr. Owens Shark at [facility]. She does have chronic pain meds tolerable.  Other medical problems are as follows:  History of Barrett's esophagus  History of mitral valve prolapse  History of hyperlipidemia  History of hypothyroidism    Orders: Orders Placed This Encounter  Procedures  . Large Joint Injection/Arthrocentesis   Meds ordered this encounter  Medications  . lidocaine (LIDODERM) 5 %    Sig: Place 1-3 patches onto the skin as needed (1-3 patches as needed for pain).    Dispense:  90 patch    Refill:  5    Face-to-face time spent with patient was 30 minutes. 50% of time was spent in counseling and coordination of care.  Follow-Up Instructions: Return in about 6 months (around 03/06/2017) for Fibromyalgia, osteoarthritis, DDD.   Bo Merino, MD  Note - This record has been created using Editor, commissioning.  Chart creation errors have been sought, but may not always  have been located. Such creation errors do not reflect on  the standard of medical care.

## 2016-08-28 NOTE — Telephone Encounter (Signed)
April, will you please call patient and schedule bilateral Euflexxa injections x 3, buy and bill?  She has an appt next week for follow-up, but I told her that she will probably have to schedule separate appts for injections.  Thanks.  Patient's insurance will cover [phone] Euflexxa if we buy and bill.  IC [employer] and spoke with Charm T. And she advised that pt's plan is effective 04/15/16, she has OOP $500 maximum.  She has met $298.49 of this.  She will owe 20% coinsurance on Euflexxa.  No auth needed, deductible does not apply to this.  I have reviewed this with the patient and advised her she will owe approximately up to $350 per knee, and that she will be billed for this.

## 2016-09-03 ENCOUNTER — Telehealth: Payer: Self-pay | Admitting: Primary Care

## 2016-09-03 ENCOUNTER — Ambulatory Visit (INDEPENDENT_AMBULATORY_CARE_PROVIDER_SITE_OTHER): Payer: Medicare Other | Admitting: Rheumatology

## 2016-09-03 ENCOUNTER — Encounter: Payer: Self-pay | Admitting: Rheumatology

## 2016-09-03 VITALS — BP 130/78 | HR 78 | Resp 14 | Ht <= 58 in | Wt 146.0 lb

## 2016-09-03 DIAGNOSIS — M19042 Primary osteoarthritis, left hand: Secondary | ICD-10-CM

## 2016-09-03 DIAGNOSIS — M25512 Pain in left shoulder: Secondary | ICD-10-CM

## 2016-09-03 DIAGNOSIS — M797 Fibromyalgia: Secondary | ICD-10-CM

## 2016-09-03 DIAGNOSIS — F5101 Primary insomnia: Secondary | ICD-10-CM

## 2016-09-03 DIAGNOSIS — Z8679 Personal history of other diseases of the circulatory system: Secondary | ICD-10-CM

## 2016-09-03 DIAGNOSIS — M47816 Spondylosis without myelopathy or radiculopathy, lumbar region: Secondary | ICD-10-CM

## 2016-09-03 DIAGNOSIS — M5030 Other cervical disc degeneration, unspecified cervical region: Secondary | ICD-10-CM | POA: Diagnosis not present

## 2016-09-03 DIAGNOSIS — M19041 Primary osteoarthritis, right hand: Secondary | ICD-10-CM

## 2016-09-03 DIAGNOSIS — Z8719 Personal history of other diseases of the digestive system: Secondary | ICD-10-CM | POA: Diagnosis not present

## 2016-09-03 DIAGNOSIS — M170 Bilateral primary osteoarthritis of knee: Secondary | ICD-10-CM | POA: Diagnosis not present

## 2016-09-03 DIAGNOSIS — R5383 Other fatigue: Secondary | ICD-10-CM | POA: Diagnosis not present

## 2016-09-03 DIAGNOSIS — Z8639 Personal history of other endocrine, nutritional and metabolic disease: Secondary | ICD-10-CM

## 2016-09-03 DIAGNOSIS — M8589 Other specified disorders of bone density and structure, multiple sites: Secondary | ICD-10-CM | POA: Diagnosis not present

## 2016-09-03 DIAGNOSIS — M47812 Spondylosis without myelopathy or radiculopathy, cervical region: Secondary | ICD-10-CM

## 2016-09-03 MED ORDER — LIDOCAINE HCL 1 % IJ SOLN
1.0000 mL | INTRAMUSCULAR | Status: AC | PRN
  Administered 2016-09-03: 1 mL

## 2016-09-03 MED ORDER — LIDOCAINE 5 % EX PTCH: 1.0000 | MEDICATED_PATCH | CUTANEOUS | 5 refills | Status: DC | PRN

## 2016-09-03 MED ORDER — TRIAMCINOLONE ACETONIDE 40 MG/ML IJ SUSP
40.0000 mg | INTRAMUSCULAR | Status: AC | PRN
  Administered 2016-09-03: 40 mg via INTRA_ARTICULAR

## 2016-09-03 NOTE — Telephone Encounter (Signed)
Patient brought in a Renewal for a Handicap Sticker to be filled out.  The form was put into the providers incoming prescription box.

## 2016-09-03 NOTE — Patient Instructions (Signed)
Shoulder Exercises Ask your health care provider which exercises are safe for you. Do exercises exactly as told by your health care provider and adjust them as directed. It is normal to feel mild stretching, pulling, tightness, or discomfort as you do these exercises, but you should stop right away if you feel sudden pain or your pain gets worse.Do not begin these exercises until told by your health care provider. RANGE OF MOTION EXERCISES  These exercises warm up your muscles and joints and improve the movement and flexibility of your shoulder. These exercises also help to relieve pain, numbness, and tingling. These exercises involve stretching your injured shoulder directly. Exercise A: Pendulum   1. Stand near a wall or a surface that you can hold onto for balance. 2. Bend at the waist and let your left / right arm hang straight down. Use your other arm to support you. Keep your back straight and do not lock your knees. 3. Relax your left / right arm and shoulder muscles, and move your hips and your trunk so your left / right arm swings freely. Your arm should swing because of the motion of your body, not because you are using your arm or shoulder muscles. 4. Keep moving your body so your arm swings in the following directions, as told by your health care provider:  Side to side.  Forward and backward.  In clockwise and counterclockwise circles. 5. Continue each motion for __________ seconds, or for as long as told by your health care provider. 6. Slowly return to the starting position. Repeat __________ times. Complete this exercise __________ times a day. Exercise B:Flexion, Standing   1. Stand and hold a broomstick, a cane, or a similar object. Place your hands a little more than shoulder-width apart on the object. Your left / right hand should be palm-up, and your other hand should be palm-down. 2. Keep your elbow straight and keep your shoulder muscles relaxed. Push the stick down  with your healthy arm to raise your left / right arm in front of your body, and then over your head until you feel a stretch in your shoulder.  Avoid shrugging your shoulder while you raise your arm. Keep your shoulder blade tucked down toward the middle of your back. 3. Hold for __________ seconds. 4. Slowly return to the starting position. Repeat __________ times. Complete this exercise __________ times a day. Exercise C: Abduction, Standing  1. Stand and hold a broomstick, a cane, or a similar object. Place your hands a little more than shoulder-width apart on the object. Your left / right hand should be palm-up, and your other hand should be palm-down. 2. While keeping your elbow straight and your shoulder muscles relaxed, push the stick across your body toward your left / right side. Raise your left / right arm to the side of your body and then over your head until you feel a stretch in your shoulder.  Do not raise your arm above shoulder height, unless your health care provider tells you to do that.  Avoid shrugging your shoulder while you raise your arm. Keep your shoulder blade tucked down toward the middle of your back. 3. Hold for __________ seconds. 4. Slowly return to the starting position. Repeat __________ times. Complete this exercise __________ times a day. Exercise D:Internal Rotation   1. Place your left / right hand behind your back, palm-up. 2. Use your other hand to dangle an exercise band, a towel, or a similar object over your   shoulder. Grasp the band with your left / right hand so you are holding onto both ends. 3. Gently pull up on the band until you feel a stretch in the front of your left / right shoulder.  Avoid shrugging your shoulder while you raise your arm. Keep your shoulder blade tucked down toward the middle of your back. 4. Hold for __________ seconds. 5. Release the stretch by letting go of the band and lowering your hands. Repeat __________ times.  Complete this exercise __________ times a day. STRETCHING EXERCISES  These exercises warm up your muscles and joints and improve the movement and flexibility of your shoulder. These exercises also help to relieve pain, numbness, and tingling. These exercises are done using your healthy shoulder to help stretch the muscles of your injured shoulder. Exercise E: Corner Stretch (External Rotation and Abduction)   1. Stand in a doorway with one of your feet slightly in front of the other. This is called a staggered stance. If you cannot reach your forearms to the door frame, stand facing a corner of a room. 2. Choose one of the following positions as told by your health care provider:  Place your hands and forearms on the door frame above your head.  Place your hands and forearms on the door frame at the height of your head.  Place your hands on the door frame at the height of your elbows. 3. Slowly move your weight onto your front foot until you feel a stretch across your chest and in the front of your shoulders. Keep your head and chest upright and keep your abdominal muscles tight. 4. Hold for __________ seconds. 5. To release the stretch, shift your weight to your back foot. Repeat __________ times. Complete this stretch __________ times a day. Exercise F:Extension, Standing  1. Stand and hold a broomstick, a cane, or a similar object behind your back.  Your hands should be a little wider than shoulder-width apart.  Your palms should face away from your back. 2. Keeping your elbows straight and keeping your shoulder muscles relaxed, move the stick away from your body until you feel a stretch in your shoulder.  Avoid shrugging your shoulders while you move the stick. Keep your shoulder blade tucked down toward the middle of your back. 3. Hold for __________ seconds. 4. Slowly return to the starting position. Repeat __________ times. Complete this exercise __________ times a  day. STRENGTHENING EXERCISES  These exercises build strength and endurance in your shoulder. Endurance is the ability to use your muscles for a long time, even after they get tired. Exercise G:External Rotation   1. Sit in a stable chair without armrests. 2. Secure an exercise band at elbow height on your left / right side. 3. Place a soft object, such as a folded towel or a small pillow, between your left / right upper arm and your body to move your elbow a few inches away (about 10 cm) from your side. 4. Hold the end of the band so it is tight and there is no slack. 5. Keeping your elbow pressed against the soft object, move your left / right forearm out, away from your abdomen. Keep your body steady so only your forearm moves. 6. Hold for __________ seconds. 7. Slowly return to the starting position. Repeat __________ times. Complete this exercise __________ times a day. Exercise H:Shoulder Abduction   1. Sit in a stable chair without armrests, or stand. 2. Hold a __________ weight in your left /   right hand, or hold an exercise band with both hands. 3. Start with your arms straight down and your left / right palm facing in, toward your body. 4. Slowly lift your left / right hand out to your side. Do not lift your hand above shoulder height unless your health care provider tells you that this is safe.  Keep your arms straight.  Avoid shrugging your shoulder while you do this movement. Keep your shoulder blade tucked down toward the middle of your back. 5. Hold for __________ seconds. 6. Slowly lower your arm, and return to the starting position. Repeat __________ times. Complete this exercise __________ times a day. Exercise I:Shoulder Extension  1. Sit in a stable chair without armrests, or stand. 2. Secure an exercise band to a stable object in front of you where it is at shoulder height. 3. Hold one end of the exercise band in each hand. Your palms should face each  other. 4. Straighten your elbows and lift your hands up to shoulder height. 5. Step back, away from the secured end of the exercise band, until the band is tight and there is no slack. 6. Squeeze your shoulder blades together as you pull your hands down to the sides of your thighs. Stop when your hands are straight down by your sides. Do not let your hands go behind your body. 7. Hold for __________ seconds. 8. Slowly return to the starting position. Repeat __________ times. Complete this exercise __________ times a day. Exercise J:Standing Shoulder Row  1. Sit in a stable chair without armrests, or stand. 2. Secure an exercise band to a stable object in front of you so it is at waist height. 3. Hold one end of the exercise band in each hand. Your palms should be in a thumbs-up position. 4. Bend each of your elbows to an "L" shape (about 90 degrees) and keep your upper arms at your sides. 5. Step back until the band is tight and there is no slack. 6. Slowly pull your elbows back behind you. 7. Hold for __________ seconds. 8. Slowly return to the starting position. Repeat __________ times. Complete this exercise __________ times a day. Exercise K:Shoulder Press-Ups   1. Sit in a stable chair that has armrests. Sit upright, with your feet flat on the floor. 2. Put your hands on the armrests so your elbows are bent and your fingers are pointing forward. Your hands should be about even with the sides of your body. 3. Push down on the armrests and use your arms to lift yourself off of the chair. Straighten your elbows and lift yourself up as much as you comfortably can.  Move your shoulder blades down, and avoid letting your shoulders move up toward your ears.  Keep your feet on the ground. As you get stronger, your feet should support less of your body weight as you lift yourself up. 4. Hold for __________ seconds. 5. Slowly lower yourself back into the chair. Repeat __________ times.  Complete this exercise __________ times a day. Exercise L: Wall Push-Ups   1. Stand so you are facing a stable wall. Your feet should be about one arm-length away from the wall. 2. Lean forward and place your palms on the wall at shoulder height. 3. Keep your feet flat on the floor as you bend your elbows and lean forward toward the wall. 4. Hold for __________ seconds. 5. Straighten your elbows to push yourself back to the starting position. Repeat __________ times. Complete   this exercise __________ times a day. This information is not intended to replace advice given to you by your health care provider. Make sure you discuss any questions you have with your health care provider. Document Released: 02/13/2005 Document Revised: 12/25/2015 Document Reviewed: 12/11/2014 Elsevier Interactive Patient Education  2017 [employer]  

## 2016-09-03 NOTE — Telephone Encounter (Signed)
Completed handicap placard and placed in Chan's inbox.

## 2016-09-03 NOTE — Telephone Encounter (Signed)
Placed form in Kate's inbox 

## 2016-09-04 NOTE — Telephone Encounter (Signed)
Notified patient that form is ready for pick up. Left in the front office. 

## 2016-09-05 ENCOUNTER — Telehealth: Payer: Self-pay

## 2016-09-05 NOTE — Telephone Encounter (Signed)
Submitted a prior authorization for lidocaine patches through cover my meds. Will update once we receive a response.   Analya Louissaint, [place], CPhT 3:35 PM

## 2016-09-06 NOTE — Telephone Encounter (Signed)
Received a fax from cover my meds regarding a prior authorization DENIAL for lidocaine patches.   Reference number:[id number]  Phone number:[phone]  Tried to call patient but could not leave a message.   Will send document to scan center.  Jermia Rigsby, [place], CPhT   10:31 AM

## 2016-09-24 DIAGNOSIS — M533 Sacrococcygeal disorders, not elsewhere classified: Secondary | ICD-10-CM | POA: Diagnosis not present

## 2016-10-18 DIAGNOSIS — J3489 Other specified disorders of nose and nasal sinuses: Secondary | ICD-10-CM | POA: Diagnosis not present

## 2016-10-18 DIAGNOSIS — Z6830 Body mass index (BMI) 30.0-30.9, adult: Secondary | ICD-10-CM | POA: Diagnosis not present

## 2016-10-18 DIAGNOSIS — J310 Chronic rhinitis: Secondary | ICD-10-CM | POA: Diagnosis not present

## 2016-10-18 DIAGNOSIS — J323 Chronic sphenoidal sinusitis: Secondary | ICD-10-CM | POA: Diagnosis not present

## 2016-10-24 ENCOUNTER — Other Ambulatory Visit: Payer: Self-pay

## 2016-10-24 ENCOUNTER — Ambulatory Visit (HOSPITAL_COMMUNITY): Payer: Medicare Other | Attending: Cardiology

## 2016-10-24 DIAGNOSIS — I340 Nonrheumatic mitral (valve) insufficiency: Secondary | ICD-10-CM | POA: Insufficient documentation

## 2016-10-24 LAB — ECHOCARDIOGRAM COMPLETE
AVLVOTPG: 6 mmHg
E decel time: 236 msec
EERAT: 8.74
FS: 33 % (ref 28–44)
IV/PV OW: 0.96
LA diam end sys: 35 mm
LA diam index: 2.1 cm/m2
LA vol index: 19.7 mL/m2
LA vol: 32.8 mL
LASIZE: 35 mm
LAVOLA4C: 35.9 mL
LV E/e' medial: 8.74
LV PW d: 8.4 mm — AB (ref 0.6–1.1)
LV TDI E'LATERAL: 8.38
LV e' LATERAL: 8.38 cm/s
LVEEAVG: 8.74
LVOT SV: 78 mL
LVOT VTI: 24.7 cm
LVOT area: 3.14 cm2
LVOT diameter: 20 mm
LVOT peak vel: 120 cm/s
MV Dec: 236
MV pk A vel: 99.4 m/s
MVPG: 2 mmHg
MVPKEVEL: 73.2 m/s
RV LATERAL S' VELOCITY: 13.7 cm/s
RV sys press: 24 mmHg
Reg peak vel: 229 cm/s
TAPSE: 19.9 mm
TDI e' medial: 5.87
TRMAXVEL: 229 cm/s
TVPG: 229 mmHg

## 2016-10-31 ENCOUNTER — Other Ambulatory Visit: Payer: Self-pay | Admitting: Primary Care

## 2016-10-31 DIAGNOSIS — M1711 Unilateral primary osteoarthritis, right knee: Secondary | ICD-10-CM

## 2016-10-31 DIAGNOSIS — M1712 Unilateral primary osteoarthritis, left knee: Secondary | ICD-10-CM | POA: Diagnosis not present

## 2016-10-31 DIAGNOSIS — Z79890 Hormone replacement therapy (postmenopausal): Secondary | ICD-10-CM

## 2016-10-31 NOTE — Telephone Encounter (Signed)
How did she do on the reduced dose of 0.3 mg of Premarin? Any symptoms? If no problems then I recommend she take 0.3 mg every other day for another 3 months.  Let me know.

## 2016-10-31 NOTE — Telephone Encounter (Signed)
Ok to refill? Electronically refill request for estrogens, conjugated, (PREMARIN) 0.3 MG tablet.  Last prescribed and seen on 06/12/2016.

## 2016-10-31 NOTE — Progress Notes (Signed)
   Procedure Note  Patient: [name] [name]             Date of Birth: [birth year]           MRN: [id number]             Visit Date: 11/04/2016  Procedures: Visit Diagnoses: Primary osteoarthritis of both knees Euflexxa #1 bilateral knees  Large Joint Inj Date/Time: 10/31/2016 8:56 AM Performed by: Bo Merino Authorized by: Bo Merino   Consent Given by:  Patient Site marked: the procedure site was marked   Timeout: prior to procedure the correct patient, procedure, and site was verified   Indications:  Pain Location:  Knee Site:  R knee Prep: patient was prepped and draped in usual sterile fashion   Needle Size:  27 G Needle Length:  1.5 inches Ultrasound Guidance: No   Fluoroscopic Guidance: No   Arthrogram: No   Medications:  1.5 mL lidocaine 2 %; 20 mg Sodium Hyaluronate 20 MG/2ML Aspiration Attempted: Yes   Patient tolerance:  Patient tolerated the procedure well with no immediate complications  Large Joint Inj Date/Time: 10/31/2016 8:56 AM Performed by: Bo Merino Authorized by: Bo Merino   Consent Given by:  Patient Site marked: the procedure site was marked   Timeout: prior to procedure the correct patient, procedure, and site was verified   Indications:  Pain Location:  Knee Site:  L knee Prep: patient was prepped and draped in usual sterile fashion   Needle Size:  27 G Needle Length:  1.5 inches Ultrasound Guidance: No   Fluoroscopic Guidance: No   Arthrogram: No   Medications:  1.5 mL lidocaine 2 %; 20 mg Sodium Hyaluronate 20 MG/2ML Aspiration Attempted: Yes   Patient tolerance:  Patient tolerated the procedure well with no immediate complications    Bo Merino, MD

## 2016-11-01 NOTE — Telephone Encounter (Signed)
Per DPR, left detail message of Kate's comments for patient to call back. 

## 2016-11-04 ENCOUNTER — Ambulatory Visit (INDEPENDENT_AMBULATORY_CARE_PROVIDER_SITE_OTHER): Payer: Medicare Other | Admitting: Rheumatology

## 2016-11-04 ENCOUNTER — Encounter: Payer: Self-pay | Admitting: Rheumatology

## 2016-11-04 ENCOUNTER — Ambulatory Visit: Payer: Medicare Other | Admitting: Rheumatology

## 2016-11-04 DIAGNOSIS — M170 Bilateral primary osteoarthritis of knee: Secondary | ICD-10-CM

## 2016-11-04 DIAGNOSIS — M1712 Unilateral primary osteoarthritis, left knee: Secondary | ICD-10-CM | POA: Diagnosis not present

## 2016-11-04 DIAGNOSIS — M1711 Unilateral primary osteoarthritis, right knee: Secondary | ICD-10-CM | POA: Diagnosis not present

## 2016-11-04 MED ORDER — SODIUM HYALURONATE (VISCOSUP) 20 MG/2ML IX SOSY
20.0000 mg | PREFILLED_SYRINGE | INTRA_ARTICULAR | Status: AC | PRN
  Administered 2016-10-31: 20 mg via INTRA_ARTICULAR

## 2016-11-04 MED ORDER — ESTROGENS CONJUGATED 0.3 MG PO TABS: ORAL_TABLET | ORAL | 0 refills | Status: DC

## 2016-11-04 MED ORDER — LIDOCAINE HCL 2 % IJ SOLN
1.5000 mL | INTRAMUSCULAR | Status: AC | PRN
  Administered 2016-10-31: 1.5 mL

## 2016-11-04 NOTE — Telephone Encounter (Signed)
Spoken to patient and she is agreeable to 0.3 mg every other day for another 3 months.

## 2016-11-04 NOTE — Telephone Encounter (Signed)
Noted, Rx sent to pharmacy. 

## 2016-11-04 NOTE — Telephone Encounter (Signed)
Pt returned your call. She said the .3 mg of Premarin is working fine, no symptoms. She needs a refill, 3 mo supply. Call if any questions.

## 2016-11-11 ENCOUNTER — Ambulatory Visit (INDEPENDENT_AMBULATORY_CARE_PROVIDER_SITE_OTHER): Payer: Medicare Other | Admitting: Rheumatology

## 2016-11-11 ENCOUNTER — Ambulatory Visit: Payer: Medicare Other | Admitting: Rheumatology

## 2016-11-11 DIAGNOSIS — M1711 Unilateral primary osteoarthritis, right knee: Secondary | ICD-10-CM | POA: Diagnosis not present

## 2016-11-11 DIAGNOSIS — M170 Bilateral primary osteoarthritis of knee: Secondary | ICD-10-CM

## 2016-11-11 DIAGNOSIS — M1712 Unilateral primary osteoarthritis, left knee: Secondary | ICD-10-CM

## 2016-11-11 MED ORDER — SODIUM HYALURONATE (VISCOSUP) 20 MG/2ML IX SOSY
20.0000 mg | PREFILLED_SYRINGE | INTRA_ARTICULAR | Status: AC | PRN
  Administered 2016-11-11: 20 mg via INTRA_ARTICULAR

## 2016-11-11 MED ORDER — LIDOCAINE HCL 2 % IJ SOLN
1.5000 mL | INTRAMUSCULAR | Status: AC | PRN
  Administered 2016-11-11: 1.5 mL

## 2016-11-11 NOTE — Progress Notes (Signed)
   Procedure Note  Patient: [name] [name]             Date of Birth: 02/26/1936           MRN: [id number]             Visit Date: 11/11/2016  Procedures: Visit Diagnoses: Bilateral primary osteoarthritis of knee - Plan: Large Joint Injection/Arthrocentesis, Large Joint Injection/Arthrocentesis Euflexxa#2 bilateral knee jointsB/B  Large Joint Inj Date/Time: 11/11/2016 12:45 PM Performed by: Bo Merino Authorized by: Bo Merino   Consent Given by:  Patient Site marked: the procedure site was marked   Timeout: prior to procedure the correct patient, procedure, and site was verified   Indications:  Pain Location:  Knee Site:  R knee Prep: patient was prepped and draped in usual sterile fashion   Needle Size:  27 G Needle Length:  1.5 inches Ultrasound Guidance: No   Fluoroscopic Guidance: No   Arthrogram: No   Medications:  1.5 mL lidocaine 2 %; 20 mg Sodium Hyaluronate 20 MG/2ML Aspiration Attempted: Yes   Patient tolerance:  Patient tolerated the procedure well with no immediate complications Large Joint Inj Date/Time: 11/11/2016 12:45 PM Performed by: Bo Merino Authorized by: Bo Merino   Consent Given by:  Patient Site marked: the procedure site was marked   Timeout: prior to procedure the correct patient, procedure, and site was verified   Indications:  Pain Location:  Knee Site:  L knee Prep: patient was prepped and draped in usual sterile fashion   Needle Size:  27 G Needle Length:  1.5 inches Ultrasound Guidance: No   Fluoroscopic Guidance: No   Arthrogram: No   Medications:  1.5 mL lidocaine 2 %; 20 mg Sodium Hyaluronate 20 MG/2ML Aspiration Attempted: Yes   Patient tolerance:  Patient tolerated the procedure well with no immediate complications   Bo Merino, MD

## 2016-11-12 NOTE — Progress Notes (Signed)
   Procedure Note  Patient: [name] [name]             Date of Birth: 1935/09/28           MRN: [id number]             Visit Date: 11/18/2016  Procedures: Visit Diagnoses: Primary osteoarthritis of both knees Bilateral knees Euflexxa #3 buy/ bill  Large Joint Inj Date/Time: 11/18/2016 11:51 AM Performed by: Bo Merino Authorized by: Bo Merino   Consent Given by:  Patient Site marked: the procedure site was marked   Timeout: prior to procedure the correct patient, procedure, and site was verified   Indications:  Pain and joint swelling Location:  Knee Site:  R knee Prep: patient was prepped and draped in usual sterile fashion   Needle Size:  27 G Needle Length:  1.5 inches Approach:  Medial Ultrasound Guidance: No   Fluoroscopic Guidance: No   Arthrogram: No   Medications:  1.5 mL lidocaine 1 %; 20 mg Sodium Hyaluronate 20 MG/2ML Aspiration Attempted: Yes   Aspirate amount (mL):  0 Patient tolerance:  Patient tolerated the procedure well with no immediate complications Large Joint Inj Date/Time: 11/18/2016 11:51 AM Performed by: Bo Merino Authorized by: Bo Merino   Consent Given by:  Patient Site marked: the procedure site was marked   Timeout: prior to procedure the correct patient, procedure, and site was verified   Indications:  Pain and joint swelling Location:  Knee Site:  L knee Prep: patient was prepped and draped in usual sterile fashion   Needle Size:  27 G Needle Length:  1.5 inches Approach:  Medial Ultrasound Guidance: No   Fluoroscopic Guidance: No   Arthrogram: No   Medications:  1.5 mL lidocaine 1 %; 20 mg Sodium Hyaluronate 20 MG/2ML Aspiration Attempted: Yes   Aspirate amount (mL):  0 Patient tolerance:  Patient tolerated the procedure well with no immediate complications   Bo Merino, MD

## 2016-11-18 ENCOUNTER — Ambulatory Visit (INDEPENDENT_AMBULATORY_CARE_PROVIDER_SITE_OTHER): Payer: Medicare Other | Admitting: Rheumatology

## 2016-11-18 ENCOUNTER — Ambulatory Visit: Payer: Medicare Other | Admitting: Rheumatology

## 2016-11-18 DIAGNOSIS — M170 Bilateral primary osteoarthritis of knee: Secondary | ICD-10-CM

## 2016-11-18 DIAGNOSIS — M1711 Unilateral primary osteoarthritis, right knee: Secondary | ICD-10-CM | POA: Diagnosis not present

## 2016-11-18 DIAGNOSIS — M1712 Unilateral primary osteoarthritis, left knee: Secondary | ICD-10-CM | POA: Diagnosis not present

## 2016-11-18 MED ORDER — LIDOCAINE HCL 1 % IJ SOLN
1.5000 mL | INTRAMUSCULAR | Status: AC | PRN
  Administered 2016-11-18: 1.5 mL

## 2016-11-18 MED ORDER — SODIUM HYALURONATE (VISCOSUP) 20 MG/2ML IX SOSY
20.0000 mg | PREFILLED_SYRINGE | INTRA_ARTICULAR | Status: AC | PRN
  Administered 2016-11-18: 20 mg via INTRA_ARTICULAR

## 2017-01-24 ENCOUNTER — Other Ambulatory Visit: Payer: Self-pay | Admitting: Primary Care

## 2017-01-24 DIAGNOSIS — K219 Gastro-esophageal reflux disease without esophagitis: Secondary | ICD-10-CM | POA: Diagnosis not present

## 2017-01-24 DIAGNOSIS — E785 Hyperlipidemia, unspecified: Secondary | ICD-10-CM

## 2017-01-24 DIAGNOSIS — J310 Chronic rhinitis: Secondary | ICD-10-CM | POA: Diagnosis not present

## 2017-01-24 DIAGNOSIS — I341 Nonrheumatic mitral (valve) prolapse: Secondary | ICD-10-CM | POA: Diagnosis not present

## 2017-01-24 DIAGNOSIS — E039 Hypothyroidism, unspecified: Secondary | ICD-10-CM | POA: Diagnosis not present

## 2017-01-24 DIAGNOSIS — Z01818 Encounter for other preprocedural examination: Secondary | ICD-10-CM | POA: Diagnosis not present

## 2017-01-27 ENCOUNTER — Ambulatory Visit (INDEPENDENT_AMBULATORY_CARE_PROVIDER_SITE_OTHER): Payer: Medicare Other

## 2017-01-27 VITALS — BP 122/68 | HR 72 | Temp 97.9°F | Ht <= 58 in | Wt 140.5 lb

## 2017-01-27 DIAGNOSIS — E039 Hypothyroidism, unspecified: Secondary | ICD-10-CM

## 2017-01-27 DIAGNOSIS — E785 Hyperlipidemia, unspecified: Secondary | ICD-10-CM | POA: Diagnosis not present

## 2017-01-27 DIAGNOSIS — Z0000 Encounter for general adult medical examination without abnormal findings: Secondary | ICD-10-CM

## 2017-01-27 LAB — COMPREHENSIVE METABOLIC PANEL
ALBUMIN: 4.6 g/dL (ref 3.5–5.2)
ALT: 17 U/L (ref 0–35)
AST: 20 U/L (ref 0–37)
Alkaline Phosphatase: 35 U/L — ABNORMAL LOW (ref 39–117)
BUN: 19 mg/dL (ref 6–23)
CALCIUM: 9.6 mg/dL (ref 8.4–10.5)
CHLORIDE: 101 meq/L (ref 96–112)
CO2: 30 meq/L (ref 19–32)
CREATININE: 1.09 mg/dL (ref 0.40–1.20)
GFR: 51.37 mL/min — ABNORMAL LOW (ref >60.00)
Glucose, Bld: 96 mg/dL (ref 70–99)
POTASSIUM: 4.3 meq/L (ref 3.5–5.1)
SODIUM: 139 meq/L (ref 135–145)
Total Bilirubin: 0.4 mg/dL (ref 0.2–1.2)
Total Protein: 6.8 g/dL (ref 6.0–8.3)

## 2017-01-27 LAB — LIPID PANEL
CHOLESTEROL: 157 mg/dL (ref 0–200)
HDL: 70.2 mg/dL (ref >39.00)
LDL CALC: 63 mg/dL (ref 0–99)
NonHDL: 86.5
Total CHOL/HDL Ratio: 2
Triglycerides: 119 mg/dL (ref 0.0–149.0)
VLDL: 23.8 mg/dL (ref 0.0–40.0)

## 2017-01-27 LAB — TSH: TSH: 1.82 u[IU]/mL (ref 0.35–4.50)

## 2017-01-27 NOTE — Progress Notes (Signed)
I reviewed health advisor's note, was available for consultation, and agree with documentation and plan.  

## 2017-01-27 NOTE — Progress Notes (Signed)
PCP notes:   Health maintenance:  Flu vaccine - postponed/ future surgery PCV13 - postponed/future surgery Bone density - addressed; has scan done at GYN's office  Abnormal screenings:   Hearing - failed.  Hearing Screening   125Hz  250Hz  500Hz  1000Hz  2000Hz  3000Hz  4000Hz  6000Hz  8000Hz   Right ear:   40 40 40  0    Left ear:   40 40 40  0     Patient concerns:   Patient states decreased dosage of Premarin is causing increased hot flashes and headaches.   Nurse concerns:  None  Next PCP appt:   01/29/17 @ 1100

## 2017-01-27 NOTE — Progress Notes (Signed)
Pre visit review using our clinic review tool, if applicable. No additional management support is needed unless otherwise documented below in the visit note. 

## 2017-01-27 NOTE — Patient Instructions (Signed)
[name] [name] , Thank you for taking time to come for your Medicare Wellness Visit. I appreciate your ongoing commitment to your health goals. Please review the following plan we discussed and let me know if I can assist you in the future.   These are the goals we discussed: Goals    . Health management          Starting 01/27/2017, I will continue to exercise for 60 min 3 days per week, to drink 5-6 glasses of water daily, and to take medications as prescribed.        This is a list of the screening recommended for you and due dates:  Health Maintenance  Topic Date Due  . Flu Shot  07/13/2017*  . Pneumonia vaccines (1 of 2 - PCV13) 07/13/2017*  . DEXA scan (bone density measurement)  01/27/2018*  . Tetanus Vaccine  02/02/2026  *Topic was postponed. The date shown is not the original due date.   Preventive Care for Adults  A healthy lifestyle and preventive care can promote health and wellness. Preventive health guidelines for adults include the following key practices.  . A routine yearly physical is a good way to check with your health care provider about your health and preventive screening. It is a chance to share any concerns and updates on your health and to receive a thorough exam.  . Visit your dentist for a routine exam and preventive care every 6 months. Brush your teeth twice a day and floss once a day. Good oral hygiene prevents tooth decay and gum disease.  . The frequency of eye exams is based on your age, health, family medical history, use  of contact lenses, and other factors. Follow your health care provider's ecommendations for frequency of eye exams.  . Eat a healthy diet. Foods like vegetables, fruits, whole grains, low-fat dairy products, and lean protein foods contain the nutrients you need without too many calories. Decrease your intake of foods high in solid fats, added sugars, and salt. Eat the right amount of calories for you. Get information about a proper diet  from your health care provider, if necessary.  . Regular physical exercise is one of the most important things you can do for your health. Most adults should get at least 150 minutes of moderate-intensity exercise (any activity that increases your heart rate and causes you to sweat) each week. In addition, most adults need muscle-strengthening exercises on 2 or more days a week.  Silver Sneakers may be a benefit available to you. To determine eligibility, you may visit the website: www.silversneakers.com or contact program at (435) 634-9610 Mon-Fri between 8AM-8PM.   . Maintain a healthy weight. The body mass index (BMI) is a screening tool to identify possible weight problems. It provides an estimate of body fat based on height and weight. Your health care provider can find your BMI and can help you achieve or maintain a healthy weight.   For adults 20 years and older: ? A BMI below 18.5 is considered underweight. ? A BMI of 18.5 to 24.9 is normal. ? A BMI of 25 to 29.9 is considered overweight. ? A BMI of 30 and above is considered obese.   . Maintain normal blood lipids and cholesterol levels by exercising and minimizing your intake of saturated fat. Eat a balanced diet with plenty of fruit and vegetables. Blood tests for lipids and cholesterol should begin at age 6 and be repeated every 5 years. If your lipid or cholesterol levels  are high, you are over 50, or you are at high risk for heart disease, you may need your cholesterol levels checked more frequently. Ongoing high lipid and cholesterol levels should be treated with medicines if diet and exercise are not working.  . If you smoke, find out from your health care provider how to quit. If you do not use tobacco, please do not start.  . If you choose to drink alcohol, please do not consume more than 2 drinks per day. One drink is considered to be 12 ounces (355 mL) of beer, 5 ounces (148 mL) of wine, or 1.5 ounces (44 mL) of liquor.  .  If you are 50-70 years old, ask your health care provider if you should take aspirin to prevent strokes.  . Use sunscreen. Apply sunscreen liberally and repeatedly throughout the day. You should seek shade when your shadow is shorter than you. Protect yourself by wearing long sleeves, pants, a wide-brimmed hat, and sunglasses year round, whenever you are outdoors.  . Once a month, do a whole body skin exam, using a mirror to look at the skin on your back. Tell your health care provider of new moles, moles that have irregular borders, moles that are larger than a pencil eraser, or moles that have changed in shape or color.

## 2017-01-27 NOTE — Progress Notes (Signed)
Subjective:   [name] [name] is a 81 y.o. female who presents for an Initial Medicare Annual Wellness Visit.  Review of Systems    N/A  Cardiac Risk Factors include: advanced age (>71men, >36 women);obesity (BMI >30kg/m2);dyslipidemia     Objective:    Today's Vitals   01/27/17 1017 01/27/17 1022  BP: 122/68   Pulse: 72   Temp: 97.9 F (36.6 C)   TempSrc: Oral   SpO2: 96%   Weight: 140 lb 8 oz (63.7 kg)   Height: 4' 8.25" (1.429 m)   PainSc: 3  3   PainLoc: Back    Body mass index is 31.22 kg/m.   Current Medications (verified) Outpatient Encounter Prescriptions as of 01/27/2017  Medication Sig  . ALPHA LIPOIC ACID PO Take 1 tablet by mouth daily. Daily   . Biotin 10 MG TABS Take 1 tablet by mouth daily. Daily   . calcium carbonate (OS-CAL) 600 MG TABS Take 1,200 mg by mouth 2 (two) times daily with a meal.  . cholecalciferol (VITAMIN D) 1000 UNITS tablet Take 1,000 Units by mouth daily.  Marland Kitchen estrogens, conjugated, (PREMARIN) 0.3 MG tablet Take 1 tablet by mouth every other day.  . fenofibrate micronized (LOFIBRA) 134 MG capsule Take 1 capsule (134 mg total) by mouth at bedtime.  . fish oil-omega-3 fatty acids 1000 MG capsule Take 2 g by mouth daily.  Marland Kitchen gabapentin (NEURONTIN) 100 MG capsule TAKE 1 CAPSULE (100 MG TOTAL) BY MOUTH 3 (THREE) TIMES DAILY.  . Ginger, Zingiber officinalis, (GINGER PO) Take 1 tablet by mouth daily.  Marland Kitchen levothyroxine (SYNTHROID, LEVOTHROID) 50 MCG tablet Take 1 tablet by mouth every morning on an empty stomach with a full glass of water.  . lidocaine (LIDODERM) 5 % Place 1-3 patches onto the skin as needed (1-3 patches as needed for pain).  . metoprolol (LOPRESSOR) 50 MG tablet Take 50 mg by mouth in the am and 25 mg by mouth in the pm  . NON FORMULARY Take 1 capsule by mouth daily. Tumeric      Daily   . sulfamethoxazole-trimethoprim (BACTRIM DS) 800-160 MG per tablet Take 1 tablet by mouth 2 (two) times daily.   Marland Kitchen tretinoin (RETIN-A) 0.05 %  cream Apply 1 application topically daily as needed (as directed per dermatologist).   . VOLTAREN 1 % GEL Apply 1 application topically as needed. pain   No facility-administered encounter medications on file as of 01/27/2017.     Allergies (verified) Fenofibric acid; Nsaids; Statins; and Trilipix [choline fenofibrate]   History: Past Medical History:  Diagnosis Date  . Anxiety    prev on prozac  . Chest pain    Felt to be noncardiac in the past  . Colon polyp   . Dyslipidemia   . Ejection fraction    EF 60%, echo, 2007, atrial septum bows left to right compatible with increased left atrial pressure  . Fibromyalgia   . GERD (gastroesophageal reflux disease)    Barrett's esophagus  . Hiatal hernia   . Lumbar spine pain    Complex lumbar spine surgery at [facility], 6962, complicated, MRSA, much of the appliance is removed, patient on chronic antibiotics  . Melanoma ([place])   . Mitral regurgitation    Mild, 2007,  /  moderate or severe echo, September, 2012, eccentric jet wrapping the left atrium, consider TEE for further assessment  . Mitral valve prolapse    Mild mitral regurgitation echo, 2007  . Osteoarthritis   . Scoliosis   .  Sinusitis    Multiple sinus operations in the past  . Statin intolerance    As of 2009, no further attempts to use statins  . Thyroid nodule    Past Surgical History:  Procedure Laterality Date  . ABDOMINAL HYSTERECTOMY    . BACK SURGERY    . CHOLECYSTECTOMY    . FOOT SURGERY    . HERNIA REPAIR    . NASAL SINUS SURGERY     Family History  Problem Relation Age of Onset  . GER disease Mother   . Dementia Mother   . Breast cancer Mother   . Heart disease Father        MI at 44  . Colon cancer Neg Hx    Social History   Occupational History  . Not on file.   Social History Main Topics  . Smoking status: Never Smoker  . Smokeless tobacco: Never Used  . Alcohol use No  . Drug use: No  . Sexual activity: Not on file    Tobacco  Counseling Counseling given: No   Activities of Daily Living In your present state of health, do you have any difficulty performing the following activities: 01/27/2017  Hearing? N  Vision? N  Difficulty concentrating or making decisions? N  Walking or climbing stairs? Y  Comment walking long distances causes fatigue  Dressing or bathing? N  Doing errands, shopping? N  Preparing Food and eating ? N  Using the Toilet? N  In the past six months, have you accidently leaked urine? N  Do you have problems with loss of bowel control? N  Managing your Medications? N  Managing your Finances? N  Housekeeping or managing your Housekeeping? N  Some recent data might be hidden    Immunizations and Health Maintenance Immunization History  Administered Date(s) Administered  . Influenza Split 02/03/2012  . Influenza-Unspecified 02/02/2016  . Td 02/03/2016   There are no preventive care reminders to display for this patient.  Patient Care Team: [name], NP as PCP - General (Internal Medicine)  Indicate any recent Medical Services you may have received from other than [facility] providers in the past year (date may be approximate).     Assessment:   This is a routine wellness examination for [name] [name].   Hearing/Vision screen  Hearing Screening   125Hz  250Hz  500Hz  1000Hz  2000Hz  3000Hz  4000Hz  6000Hz  8000Hz   Right ear:   40 40 40  0    Left ear:   40 40 40  0      Visual Acuity Screening   Right eye Left eye Both eyes  Without correction: 20/30-1 20/25-1 20/20-1  With correction:       Dietary issues and exercise activities discussed: Current Exercise Habits: Home exercise routine, Type of exercise: Other - see comments (exercises at [facility] 60 min/3 days per week), Time (Minutes): 60, Frequency (Times/Week): 3, Weekly Exercise (Minutes/Week): 180, Intensity: Moderate, Exercise limited by: None identified  Goals    . Health management          Starting 01/27/2017, I will  continue to exercise for 60 min 3 days per week, to drink 5-6 glasses of water daily, and to take medications as prescribed.       Depression Screen PHQ 2/9 Scores 01/27/2017  PHQ - 2 Score 0  PHQ- 9 Score 0    Fall Risk Fall Risk  01/27/2017  Falls in the past year? No    Cognitive Function: MMSE - Mini Mental State Exam  01/27/2017  Orientation to time 5  Orientation to Place 5  Registration 3  Attention/ Calculation 0  Recall 3  Language- name 2 objects 0  Language- repeat 1  Language- follow 3 step command 3  Language- read & follow direction 0  Write a sentence 0  Copy design 0  Total score 20     PLEASE NOTE: A Mini-Cog screen was completed. Maximum score is 20. A value of 0 denotes this part of Folstein MMSE was not completed or the patient failed this part of the Mini-Cog screening.   Mini-Cog Screening Orientation to Time - Max 5 pts Orientation to Place - Max 5 pts Registration - Max 3 pts Recall - Max 3 pts Language Repeat - Max 1 pts Language Follow 3 Step Command - Max 3 pts     Screening Tests Health Maintenance  Topic Date Due  . INFLUENZA VACCINE  07/13/2017 (Originally 11/13/2016)  . PNA vac Low Risk Adult (1 of 2 - PCV13) 07/13/2017 (Originally 05/01/2002)  . DEXA SCAN  01/27/2018 (Originally 05/01/2002)  . TETANUS/TDAP  02/02/2026      Plan:     I have personally reviewed and addressed the Medicare Annual Wellness questionnaire and have noted the following in the patient's chart:  A. Medical and social history B. Use of alcohol, tobacco or illicit drugs  C. Current medications and supplements D. Functional ability and status E.  Nutritional status F.  Physical activity G. Advance directives H. List of other physicians I.  Hospitalizations, surgeries, and ER visits in previous 12 months J.  Cook to include hearing, vision, cognitive, depression L. Referrals and appointments - none  In addition, I have reviewed and  discussed with patient certain preventive protocols, quality metrics, and best practice recommendations. A written personalized care plan for preventive services as well as general preventive health recommendations were provided to patient.  See attached scanned questionnaire for additional information.   Signed,   Lindell Noe, MHA, BS, LPN Health Coach

## 2017-01-29 ENCOUNTER — Ambulatory Visit (INDEPENDENT_AMBULATORY_CARE_PROVIDER_SITE_OTHER): Payer: Medicare Other | Admitting: Primary Care

## 2017-01-29 ENCOUNTER — Encounter: Payer: Self-pay | Admitting: Primary Care

## 2017-01-29 VITALS — BP 122/68 | Temp 98.0°F | Ht <= 58 in | Wt 140.8 lb

## 2017-01-29 DIAGNOSIS — M8589 Other specified disorders of bone density and structure, multiple sites: Secondary | ICD-10-CM

## 2017-01-29 DIAGNOSIS — E039 Hypothyroidism, unspecified: Secondary | ICD-10-CM

## 2017-01-29 DIAGNOSIS — R002 Palpitations: Secondary | ICD-10-CM | POA: Diagnosis not present

## 2017-01-29 DIAGNOSIS — Z0001 Encounter for general adult medical examination with abnormal findings: Secondary | ICD-10-CM | POA: Insufficient documentation

## 2017-01-29 DIAGNOSIS — D649 Anemia, unspecified: Secondary | ICD-10-CM | POA: Insufficient documentation

## 2017-01-29 DIAGNOSIS — Z0000 Encounter for general adult medical examination without abnormal findings: Secondary | ICD-10-CM | POA: Diagnosis not present

## 2017-01-29 DIAGNOSIS — N289 Disorder of kidney and ureter, unspecified: Secondary | ICD-10-CM | POA: Diagnosis not present

## 2017-01-29 DIAGNOSIS — E041 Nontoxic single thyroid nodule: Secondary | ICD-10-CM

## 2017-01-29 DIAGNOSIS — Z79890 Hormone replacement therapy (postmenopausal): Secondary | ICD-10-CM

## 2017-01-29 DIAGNOSIS — E785 Hyperlipidemia, unspecified: Secondary | ICD-10-CM

## 2017-01-29 DIAGNOSIS — I341 Nonrheumatic mitral (valve) prolapse: Secondary | ICD-10-CM

## 2017-01-29 DIAGNOSIS — I340 Nonrheumatic mitral (valve) insufficiency: Secondary | ICD-10-CM

## 2017-01-29 DIAGNOSIS — K219 Gastro-esophageal reflux disease without esophagitis: Secondary | ICD-10-CM | POA: Diagnosis not present

## 2017-01-29 MED ORDER — ESTROGENS CONJUGATED 0.3 MG PO TABS: 0.3000 mg | ORAL_TABLET | Freq: Every day | ORAL | 0 refills | Status: DC

## 2017-01-29 NOTE — Assessment & Plan Note (Signed)
Continue metoprolol, feels well managed.

## 2017-01-29 NOTE — Patient Instructions (Addendum)
Continue to take the Premarin every other day. Please update me in 2-3 months if your symptoms persist.  Continue regular exercise. Increase vegetables, fruit, whole grains.  Ensure you are consuming 64 ounces of water daily.  Schedule a nurse visit for the Prevnar 13 and influenza vaccinations once you've recovered from surgery.  Schedule a lab only appointment in 3 months to repeat kidney function and blood counts.  Follow up in 1 year for your annual exam or sooner if needed.  It was a pleasure to see you today!

## 2017-01-29 NOTE — Assessment & Plan Note (Addendum)
Following with rheumatology.  Currently managed on Tylenol and gabapentin for arthritis. Not using Voltaren often, prefers OTC products. Overall stable.

## 2017-01-29 NOTE — Assessment & Plan Note (Signed)
Using natural supplements and Zantac PRN for symptoms. No Nexium use within the past 1 year, doing well off.

## 2017-01-29 NOTE — Assessment & Plan Note (Signed)
Repeat ultrasound due in 2019.

## 2017-01-29 NOTE — Progress Notes (Signed)
Subjective:    Patient ID: [name] [name], female    DOB: [birth year], 81 y.o.   MRN: [id number]  HPI  [name] [name] is a 81 year old female who presents today for complete physical.  Immunizations: -Tetanus: Completed in 2017 -Influenza: Postponed due to upcoming sinus surgery -Pneumonia: Due, postponed due to upcoming sinus surgery   Diet: She endorses a healthy diet. Breakfast: Egg whites, bacon, tomato, croissant  Lunch: Soup, sandwich Dinner: Restaurants, meat, vegetable, starch Snacks: Pretzels, nuts Desserts: Occasional candy and cookies several nights weekly Beverages: Coffee, water  Exercise: She works out at [employer] three times weekly Eye exam: Completed 1 year ago. Dental exam: Completes several times annually Colonoscopy: Completed in 2010 Dexa: Completed 1 year ago. Due next year.  Pap Smear: Hysterectomy Mammogram: Completed in December 2017.   Review of Systems  Constitutional: Negative for unexpected weight change.  HENT: Negative for rhinorrhea.   Respiratory: Negative for cough and shortness of breath.   Cardiovascular: Negative for chest pain.  Gastrointestinal: Negative for constipation and diarrhea.  Genitourinary: Negative for difficulty urinating.       Hot flahes  Musculoskeletal: Positive for arthralgias. Negative for myalgias.  Skin: Negative for rash.  Allergic/Immunologic: Negative for environmental allergies.  Neurological: Positive for headaches. Negative for dizziness and numbness.  Psychiatric/Behavioral: The patient is not nervous/anxious.        Past Medical History:  Diagnosis Date  . Anxiety    prev on prozac  . Chest pain    Felt to be noncardiac in the past  . Colon polyp   . Dyslipidemia   . Ejection fraction    EF 60%, echo, 2007, atrial septum bows left to right compatible with increased left atrial pressure  . Fibromyalgia   . GERD (gastroesophageal reflux disease)    Barrett's esophagus  . Hiatal hernia   . Lumbar  spine pain    Complex lumbar spine surgery at [facility], 3825, complicated, MRSA, much of the appliance is removed, patient on chronic antibiotics  . Melanoma ([place])   . Mitral regurgitation    Mild, 2007,  /  moderate or severe echo, September, 2012, eccentric jet wrapping the left atrium, consider TEE for further assessment  . Mitral valve prolapse    Mild mitral regurgitation echo, 2007  . Osteoarthritis   . Scoliosis   . Sinusitis    Multiple sinus operations in the past  . Statin intolerance    As of 2009, no further attempts to use statins  . Thyroid nodule      Social History   Social History  . Marital status: Married    Spouse name: N/A  . Number of children: N/A  . Years of education: N/A   Occupational History  . Not on file.   Social History Main Topics  . Smoking status: Never Smoker  . Smokeless tobacco: Never Used  . Alcohol use No  . Drug use: No  . Sexual activity: Not on file   Other Topics Concern  . Not on file   Social History Narrative   From [place]   Married (second [phone]   3 kids, 2 of whom are local   Retired, Camera operator    Past Surgical History:  Procedure Laterality Date  . ABDOMINAL HYSTERECTOMY    . BACK SURGERY    . CHOLECYSTECTOMY    . FOOT SURGERY    . HERNIA REPAIR    . NASAL SINUS SURGERY  Family History  Problem Relation Age of Onset  . GER disease Mother   . Dementia Mother   . Breast cancer Mother   . Heart disease Father        MI at 53  . Colon cancer Neg Hx     Allergies  Allergen Reactions  . Fenofibric Acid Other (See Comments)    GI upset  . Nsaids     REACTION: No long term use  . Statins Other (See Comments)    Intolerant, myalgias Intolerant, myalgias  . Trilipix [Choline Fenofibrate]     GI upset    Current Outpatient Prescriptions on File Prior to Visit  Medication Sig Dispense Refill  . ALPHA LIPOIC ACID PO Take 1 tablet by mouth daily. Daily     . Biotin 10 MG TABS Take 1  tablet by mouth daily. Daily     . calcium carbonate (OS-CAL) 600 MG TABS Take 1,200 mg by mouth 2 (two) times daily with a meal.    . cholecalciferol (VITAMIN D) 1000 UNITS tablet Take 1,000 Units by mouth daily.    . fenofibrate micronized (LOFIBRA) 134 MG capsule Take 1 capsule (134 mg total) by mouth at bedtime. 90 capsule 2  . fish oil-omega-3 fatty acids 1000 MG capsule Take 2 g by mouth daily.    Marland Kitchen gabapentin (NEURONTIN) 100 MG capsule TAKE 1 CAPSULE (100 MG TOTAL) BY MOUTH 3 (THREE) TIMES DAILY. 180 capsule 1  . Ginger, Zingiber officinalis, (GINGER PO) Take 1 tablet by mouth daily.    Marland Kitchen levothyroxine (SYNTHROID, LEVOTHROID) 50 MCG tablet Take 1 tablet by mouth every morning on an empty stomach with a full glass of water. 90 tablet 1  . lidocaine (LIDODERM) 5 % Place 1-3 patches onto the skin as needed (1-3 patches as needed for pain). 90 patch 5  . metoprolol (LOPRESSOR) 50 MG tablet Take 50 mg by mouth in the am and 25 mg by mouth in the pm 135 tablet 3  . NON FORMULARY Take 1 capsule by mouth daily. Tumeric      Daily     . sulfamethoxazole-trimethoprim (BACTRIM DS) 800-160 MG per tablet Take 1 tablet by mouth 2 (two) times daily.     Marland Kitchen tretinoin (RETIN-A) 0.05 % cream Apply 1 application topically daily as needed (as directed per dermatologist).   3  . VOLTAREN 1 % GEL Apply 1 application topically as needed. pain     No current facility-administered medications on file prior to visit.     BP 122/68   Temp 98 F (36.7 C) (Oral)   Ht 4' 8.25" (1.429 m)   Wt 140 lb 12.8 oz (63.9 kg)   SpO2 96%   BMI 31.29 kg/m    Objective:   Physical Exam  Constitutional: She is oriented to person, place, and time. She appears well-nourished.  HENT:  Right Ear: Tympanic membrane and ear canal normal.  Left Ear: Tympanic membrane and ear canal normal.  Nose: Nose normal.  Mouth/Throat: Oropharynx is clear and moist.  Eyes: Pupils are equal, round, and reactive to light. Conjunctivae  and EOM are normal.  Neck: Neck supple. No thyromegaly present.  Cardiovascular: Normal rate and regular rhythm.   No murmur heard. Pulmonary/Chest: Effort normal and breath sounds normal. She has no rales.  Abdominal: Soft. Bowel sounds are normal. There is no tenderness.  Musculoskeletal: Normal range of motion.  Lymphadenopathy:    She has no cervical adenopathy.  Neurological: She is alert and oriented  to person, place, and time. She has normal reflexes. No cranial nerve deficit.  Skin: Skin is warm and dry. No rash noted.  Psychiatric: She has a normal mood and affect.          Assessment & Plan:

## 2017-01-29 NOTE — Assessment & Plan Note (Signed)
Recent TSH stable, continue levothyroxine 50 mcg.

## 2017-01-29 NOTE — Assessment & Plan Note (Signed)
Will get Prevnar and influenza after sinus surgery. Bone density due in 2019. Mammogram due in December 2018, pending. Colonoscopy UTD. Continue regular exercise. Increase vegetables/fruit/whole grains. Exam unremarkable. Labs stable. Follow up in 1 year.

## 2017-01-29 NOTE — Assessment & Plan Note (Signed)
Bone density due in 2019, continue calcium and vitamin D.

## 2017-01-29 NOTE — Assessment & Plan Note (Signed)
Recent GFR of 51, improved from 48 one year ago. Will continue to monitor and repeat in 3 months.

## 2017-01-29 NOTE — Assessment & Plan Note (Signed)
Currently managed on Premarin 0.3 every other day and has started experiencing hot flashes and headaches. Felt better on the Premarin daily but would like to try to see if she can tolerate the symptoms. She will call if she'd like to try daily dosing again.

## 2017-01-29 NOTE — Assessment & Plan Note (Signed)
Following with cardiology, recent echocardiogram with normal LV function and mild regurgitation. Continue metoprolol tartrate.

## 2017-01-29 NOTE — Assessment & Plan Note (Signed)
Recent lipid panel stable. Continue fenofibrate. Recommended regular exercise.

## 2017-01-29 NOTE — Assessment & Plan Note (Signed)
History of Iron deficiency anemia.  Repeat labs in 3 months. Asymptomatic.

## 2017-01-29 NOTE — Assessment & Plan Note (Signed)
Following with cardiology, recent echocardiogram with normal LV function and mild regurgitation.

## 2017-02-03 DIAGNOSIS — Z9049 Acquired absence of other specified parts of digestive tract: Secondary | ICD-10-CM | POA: Diagnosis not present

## 2017-02-03 DIAGNOSIS — K219 Gastro-esophageal reflux disease without esophagitis: Secondary | ICD-10-CM | POA: Diagnosis not present

## 2017-02-03 DIAGNOSIS — J342 Deviated nasal septum: Secondary | ICD-10-CM | POA: Diagnosis not present

## 2017-02-03 DIAGNOSIS — Z79899 Other long term (current) drug therapy: Secondary | ICD-10-CM | POA: Diagnosis not present

## 2017-02-03 DIAGNOSIS — E039 Hypothyroidism, unspecified: Secondary | ICD-10-CM | POA: Diagnosis not present

## 2017-02-03 DIAGNOSIS — J323 Chronic sphenoidal sinusitis: Secondary | ICD-10-CM | POA: Diagnosis not present

## 2017-02-03 DIAGNOSIS — J329 Chronic sinusitis, unspecified: Secondary | ICD-10-CM | POA: Diagnosis not present

## 2017-02-03 DIAGNOSIS — R51 Headache: Secondary | ICD-10-CM | POA: Diagnosis not present

## 2017-02-03 DIAGNOSIS — M1990 Unspecified osteoarthritis, unspecified site: Secondary | ICD-10-CM | POA: Diagnosis not present

## 2017-02-03 DIAGNOSIS — J3489 Other specified disorders of nose and nasal sinuses: Secondary | ICD-10-CM | POA: Diagnosis not present

## 2017-02-03 DIAGNOSIS — Z888 Allergy status to other drugs, medicaments and biological substances status: Secondary | ICD-10-CM | POA: Diagnosis not present

## 2017-02-03 DIAGNOSIS — I341 Nonrheumatic mitral (valve) prolapse: Secondary | ICD-10-CM | POA: Diagnosis not present

## 2017-02-03 DIAGNOSIS — M950 Acquired deformity of nose: Secondary | ICD-10-CM | POA: Diagnosis not present

## 2017-02-11 ENCOUNTER — Ambulatory Visit: Payer: Medicare Other

## 2017-02-12 DIAGNOSIS — J324 Chronic pansinusitis: Secondary | ICD-10-CM | POA: Diagnosis not present

## 2017-02-20 ENCOUNTER — Ambulatory Visit (INDEPENDENT_AMBULATORY_CARE_PROVIDER_SITE_OTHER): Payer: Medicare Other

## 2017-02-20 DIAGNOSIS — Z23 Encounter for immunization: Secondary | ICD-10-CM

## 2017-03-04 ENCOUNTER — Ambulatory Visit: Payer: Medicare Other | Admitting: Rheumatology

## 2017-03-08 NOTE — Progress Notes (Signed)
Office Visit Note  Patient: [name] [name]             Date of Birth: 11/04/1935           MRN: [id number]             PCP: [name], NP Referring: [name], NP Visit Date: 03/21/2017 Occupation: @GUAROCC @    Subjective:  Fibromyalgia , right knee pain   History of Present Illness: [name] [name] is a 81 y.o. female with history of fibromyalgia and osteoarthritis. She had Visco supplement injections in August. Her right knee joint started hurting again. Her left knee joint is doing well. He is to have pain and discomfort in her bilateral hands. She reports swelling in her left fourth PIP joint. Her feet has been painful as well. She continues to have discomfort in her C-spine and lumbar spine but she does not want to have surgery due to previous MRSA infection.  Activities of Daily Living:  Patient reports morning stiffness for 10 minutes.   Patient Reports nocturnal pain.  Difficulty dressing/grooming: Denies Difficulty climbing stairs: Reports Difficulty getting out of chair: Reports Difficulty using hands for taps, buttons, cutlery, and/or writing: Denies   Review of Systems  Constitutional: Positive for fatigue. Negative for night sweats, weight gain, weight loss and weakness.  HENT: Negative for mouth sores, trouble swallowing, trouble swallowing, mouth dryness and nose dryness.   Eyes: Negative for pain, redness, visual disturbance and dryness.  Respiratory: Negative for cough, shortness of breath and difficulty breathing.   Cardiovascular: Negative for chest pain, palpitations, hypertension, irregular heartbeat and swelling in legs/feet.  Gastrointestinal: Negative for blood in stool, constipation and diarrhea.  Endocrine: Negative for increased urination.  Genitourinary: Negative for vaginal dryness.  Musculoskeletal: Positive for arthralgias, joint pain, joint swelling and morning stiffness. Negative for myalgias, muscle weakness, muscle tenderness and  myalgias.  Skin: Negative for color change, rash, hair loss, skin tightness, ulcers and sensitivity to sunlight.  Allergic/Immunologic: Negative for susceptible to infections.  Neurological: Negative for dizziness, memory loss and night sweats.  Hematological: Negative for swollen glands.  Psychiatric/Behavioral: Negative for depressed mood and sleep disturbance. The patient is not nervous/anxious.     PMFS History:  Patient Active Problem List   Diagnosis Date Noted  . Decreased renal function 01/29/2017  . Anemia 01/29/2017  . Preventative health care 01/29/2017  . Other fatigue 09/03/2016  . Primary insomnia 09/03/2016  . Primary osteoarthritis of both knees 09/03/2016  . DJD (degenerative joint disease), cervical 09/03/2016  . Osteopenia 03/04/2016  . Palpitations 09/19/2014  . Thyroid nodule, uninodular 09/18/2011  . Hypothyroid 08/19/2011  . Postmenopausal HRT (hormone replacement therapy) 08/13/2011  . Mitral regurgitation   . Lumbar spine pain   . Dyslipidemia   . Chest pain   . Statin intolerance   . Mitral valve prolapse   . Ejection fraction   . Fibromyalgia   . ADENOMATOUS COLONIC POLYP 08/15/2007  . GERD 08/15/2007  . BARRETTS ESOPHAGUS 08/15/2007  . HIATAL HERNIA 08/15/2007  . Osteoarthritis 08/15/2007  . CATARACT EXTRACTION, HX OF 08/15/2007    Past Medical History:  Diagnosis Date  . Anxiety    prev on prozac  . Chest pain    Felt to be noncardiac in the past  . Colon polyp   . Dyslipidemia   . Ejection fraction    EF 60%, echo, 2007, atrial septum bows left to right compatible with increased left atrial pressure  . Fibromyalgia   .  GERD (gastroesophageal reflux disease)    Barrett's esophagus  . Hiatal hernia   . Lumbar spine pain    Complex lumbar spine surgery at [facility], 5400, complicated, MRSA, much of the appliance is removed, patient on chronic antibiotics  . Melanoma ([place])   . Mitral regurgitation    Mild, 2007,  /  moderate or severe echo,  September, 2012, eccentric jet wrapping the left atrium, consider TEE for further assessment  . Mitral valve prolapse    Mild mitral regurgitation echo, 2007  . Osteoarthritis   . Scoliosis   . Sinusitis    Multiple sinus operations in the past  . Statin intolerance    As of 2009, no further attempts to use statins  . Thyroid nodule     Family History  Problem Relation Age of Onset  . GER disease Mother   . Dementia Mother   . Breast cancer Mother   . Heart disease Father        MI at 24  . Scoliosis Daughter   . Colon cancer Neg Hx    Past Surgical History:  Procedure Laterality Date  . ABDOMINAL HYSTERECTOMY    . BACK SURGERY    . CHOLECYSTECTOMY    . FOOT SURGERY    . HERNIA REPAIR    . NASAL SINUS SURGERY     Social History   Social History Narrative   From [place]   Married (second [phone]   3 kids, 2 of whom are local   Retired, Camera operator     Objective: Vital Signs: BP 116/65 (BP Location: Left Arm, Patient Position: Sitting, Cuff Size: Normal)   Pulse 71   Resp 18   Ht 4\' 9"  (1.448 m)   Wt 147 lb (66.7 kg)   BMI 31.81 kg/m    Physical Exam  Constitutional: She is oriented to person, place, and time. She appears well-developed and well-nourished.  HENT:  Head: Normocephalic and atraumatic.  Eyes: Conjunctivae and EOM are normal.  Neck: Normal range of motion.  Cardiovascular: Normal rate, regular rhythm, normal heart sounds and intact distal pulses.  Pulmonary/Chest: Effort normal and breath sounds normal.  Abdominal: Soft. Bowel sounds are normal.  Lymphadenopathy:    She has no cervical adenopathy.  Neurological: She is alert and oriented to person, place, and time.  Skin: Skin is warm and dry. Capillary refill takes less than 2 seconds.  Psychiatric: She has a normal mood and affect. Her behavior is normal.  Nursing note and vitals reviewed.    Musculoskeletal Exam: C-spine and lumbar spine limited range of motion. She is  thoracic kyphosis. Shoulder joints elbow joints wrist joints are good range of motion. She has DIP PIP severe disease with inflammatory changes and some of her PIP joints. Hip joints are good range of motion. She has discomfort range of motion of bilateral knee joints with some warmth in her right knee. She does have osteoarthritic changes in her feet.  CDAI Exam: No CDAI exam completed.    Investigation: No additional findings.   Imaging: No results found.  Speciality Comments: No specialty comments available.    Procedures:  No procedures performed Allergies: Fenofibric acid; Nsaids; Statins; and Trilipix [choline fenofibrate]   Assessment / Plan:     Visit Diagnoses: Fibromyalgia: She continues to have some generalized pain and discomfort due to underlying fibromyalgia. She has positive tender points.  Primary insomnia - insomnia has improved with current regimen.  Other fatigue: She has mild fatigue.  Primary osteoarthritis of both knees. She continues to have discomfort in her knee joints. She had good response to cortisone injection viscose supplement injections in the past. She's been having some discomfort in her right knee joint with some warmth on palpation. She wants to hold off cortisone injection for right now. She requested prescription for Pennsaid instead of Voltaren gel. I have discontinued her Voltaren gel prescription and gave her a prescription for Pennsaid. Side effects were reviewed.  Primary osteoarthritis of both hands - She has severe inflammatory osteoarthritis involving her PIP/DIP joints. I offered cortisone injection to her left fourth PIP but she declined.  Primary osteoarthritis of both feet  DDD (degenerative disc disease), cervical - Worst level C6-7: She does have chronic pain she's concerned about having surgery as she had previous MRSA after her lumbar spine surgery.  DDD (degenerative disc disease), lumbar - status post fusion T11-S1 with iliac  the screws 2012 by Dr. Owens Shark at [facility]. Pain is tolerable on current regimen.  Osteopenia of multiple sites -  T score -2.3 09/2015. Need for regular exercise calcium and vitamin D was discussed. We will repeat bone density again in 2019.  History of hyperlipidemia  History of Barrett's esophagus  History of hypothyroidism  History of mitral valve prolapse    Orders: No orders of the defined types were placed in this encounter.  Meds ordered this encounter  Medications  . Diclofenac Sodium 2 % SOLN    Sig: Place 2 Pump onto the skin 2 (two) times daily.    Dispense:  112 g    Refill:  1    Face-to-face time spent with patient was 30 minutes. Greater than 50% of time was spent in counseling and coordination of care.  Follow-Up Instructions: Return in about 6 months (around 09/19/2017) for OA DDD FMS.   Bo Merino, MD  Note - This record has been created using [employer].  Chart creation errors have been sought, but may not always  have been located. Such creation errors do not reflect on  the standard of medical care.

## 2017-03-11 ENCOUNTER — Other Ambulatory Visit: Payer: Self-pay | Admitting: Primary Care

## 2017-03-11 DIAGNOSIS — E785 Hyperlipidemia, unspecified: Secondary | ICD-10-CM

## 2017-03-18 ENCOUNTER — Other Ambulatory Visit: Payer: Self-pay | Admitting: Primary Care

## 2017-03-18 DIAGNOSIS — Z1231 Encounter for screening mammogram for malignant neoplasm of breast: Secondary | ICD-10-CM

## 2017-03-21 ENCOUNTER — Ambulatory Visit (INDEPENDENT_AMBULATORY_CARE_PROVIDER_SITE_OTHER): Payer: Medicare Other | Admitting: Rheumatology

## 2017-03-21 ENCOUNTER — Encounter: Payer: Self-pay | Admitting: Rheumatology

## 2017-03-21 VITALS — BP 116/65 | HR 71 | Resp 18 | Ht <= 58 in | Wt 147.0 lb

## 2017-03-21 DIAGNOSIS — M5136 Other intervertebral disc degeneration, lumbar region: Secondary | ICD-10-CM

## 2017-03-21 DIAGNOSIS — Z8719 Personal history of other diseases of the digestive system: Secondary | ICD-10-CM

## 2017-03-21 DIAGNOSIS — M797 Fibromyalgia: Secondary | ICD-10-CM

## 2017-03-21 DIAGNOSIS — M19071 Primary osteoarthritis, right ankle and foot: Secondary | ICD-10-CM

## 2017-03-21 DIAGNOSIS — M19041 Primary osteoarthritis, right hand: Secondary | ICD-10-CM

## 2017-03-21 DIAGNOSIS — M19042 Primary osteoarthritis, left hand: Secondary | ICD-10-CM

## 2017-03-21 DIAGNOSIS — M19072 Primary osteoarthritis, left ankle and foot: Secondary | ICD-10-CM

## 2017-03-21 DIAGNOSIS — R5383 Other fatigue: Secondary | ICD-10-CM | POA: Diagnosis not present

## 2017-03-21 DIAGNOSIS — M5030 Other cervical disc degeneration, unspecified cervical region: Secondary | ICD-10-CM

## 2017-03-21 DIAGNOSIS — M8589 Other specified disorders of bone density and structure, multiple sites: Secondary | ICD-10-CM

## 2017-03-21 DIAGNOSIS — Z8639 Personal history of other endocrine, nutritional and metabolic disease: Secondary | ICD-10-CM

## 2017-03-21 DIAGNOSIS — F5101 Primary insomnia: Secondary | ICD-10-CM

## 2017-03-21 DIAGNOSIS — Z8679 Personal history of other diseases of the circulatory system: Secondary | ICD-10-CM

## 2017-03-21 DIAGNOSIS — M170 Bilateral primary osteoarthritis of knee: Secondary | ICD-10-CM | POA: Diagnosis not present

## 2017-03-21 MED ORDER — DICLOFENAC SODIUM 2 % TD SOLN: 2.0000 | Freq: Two times a day (BID) | TRANSDERMAL | 1 refills | Status: DC

## 2017-03-26 ENCOUNTER — Other Ambulatory Visit: Payer: Self-pay | Admitting: Primary Care

## 2017-03-26 DIAGNOSIS — E039 Hypothyroidism, unspecified: Secondary | ICD-10-CM

## 2017-03-31 DIAGNOSIS — T8579XD Infection and inflammatory reaction due to other internal prosthetic devices, implants and grafts, subsequent encounter: Secondary | ICD-10-CM | POA: Diagnosis not present

## 2017-04-14 ENCOUNTER — Other Ambulatory Visit: Payer: Self-pay | Admitting: *Deleted

## 2017-04-14 MED ORDER — DICLOFENAC SODIUM 1 % TD GEL: TRANSDERMAL | 3 refills | Status: DC

## 2017-04-14 NOTE — Telephone Encounter (Signed)
Pharmacy contacted office via fax requesting alternative to Pennsaid as the insurance requires a PA for this medication.   Okay to send Voltaren Gel per Dr. Estanislado Pandy

## 2017-04-16 ENCOUNTER — Telehealth: Payer: Self-pay | Admitting: Rheumatology

## 2017-04-16 ENCOUNTER — Telehealth: Payer: Self-pay

## 2017-04-16 NOTE — Telephone Encounter (Signed)
Patient has been scheduled for 04/17/17 at 2:30 pm

## 2017-04-16 NOTE — Telephone Encounter (Signed)
Patient called with knee pain and is having a difficult time walking.  She had 3 gel shots and it has not helped.  She is scheduled for a return appointment in June but wants to know if she should come in sooner.  CB# [phone]

## 2017-04-16 NOTE — Telephone Encounter (Signed)
Submitted a prior authorization for Voltaren Gel to pts insurance via cover my meds. Will update once we receive a response.   Baylon Santelli, [place], CPhT 2:05 PM

## 2017-04-16 NOTE — Progress Notes (Signed)
Office Visit Note  Patient: [name] [name]             Date of Birth: [birth year]           MRN: [id number]             PCP: [name], NP Referring: [name], NP Visit Date: 04/17/2017 Occupation: @GUAROCC @    Subjective:  Bilateral knee pain    History of Present Illness: CLEMENTINE [name] is a 82 y.o. female with history of fibromyalgia, osteoarthritis and degenerative disc disease.  She states she is having bilateral knee pain that has been worsening over the past 10 days.  She states the pain is worse in her right knee, which occasionally swells as well.  She denies any mechanical symptoms.  She states she uses Voltaren gel on her knees, which helps minimally.  She takes tylenol for pain relief.  She states back in July she had Euflexa injections of bilateral knees, which she feels did not help as much as previous cortisone injections.  She would like knee cortisone injections today.  She denies trying ice, elevation, or a brace for her knee pain.  She states she has bilateral hand and feet pain as well.  She states her hands swell.  She states her fibromyalgia is well controlled and she exercises 3 times a week.  She continues to take Gabapentin and uses lidoderm 5% patches for her back pain.      Activities of Daily Living:  Patient reports morning stiffness for 10  minutes.   Patient Reports nocturnal pain.  Difficulty dressing/grooming: Denies Difficulty climbing stairs: Reports Difficulty getting out of chair: Reports Difficulty using hands for taps, buttons, cutlery, and/or writing: Reports   Review of Systems  Constitutional: Positive for fatigue. Negative for weakness.  HENT: Positive for mouth dryness (Uses Biotene products). Negative for mouth sores and nose dryness.   Eyes: Negative for redness and dryness.  Respiratory: Negative for cough, hemoptysis, shortness of breath and difficulty breathing.   Cardiovascular: Negative for chest pain, palpitations,  hypertension and swelling in legs/feet.  Gastrointestinal: Negative for blood in stool, constipation and diarrhea.  Endocrine: Negative for increased urination.  Genitourinary: Negative for painful urination.  Musculoskeletal: Positive for arthralgias, joint pain, joint swelling and morning stiffness. Negative for myalgias, muscle weakness, muscle tenderness and myalgias.  Skin: Negative for color change, pallor, rash, hair loss, nodules/bumps, redness, skin tightness, ulcers and sensitivity to sunlight.  Allergic/Immunologic: Negative for susceptible to infections.  Neurological: Negative for dizziness, numbness and headaches.  Hematological: Negative for swollen glands.  Psychiatric/Behavioral: Negative for depressed mood and sleep disturbance. The patient is not nervous/anxious.     PMFS History:  Patient Active Problem List   Diagnosis Date Noted  . Decreased renal function 01/29/2017  . Anemia 01/29/2017  . Preventative health care 01/29/2017  . Other fatigue 09/03/2016  . Primary insomnia 09/03/2016  . Primary osteoarthritis of both knees 09/03/2016  . DJD (degenerative joint disease), cervical 09/03/2016  . Osteopenia 03/04/2016  . Palpitations 09/19/2014  . Thyroid nodule, uninodular 09/18/2011  . Hypothyroid 08/19/2011  . Postmenopausal HRT (hormone replacement therapy) 08/13/2011  . Mitral regurgitation   . Lumbar spine pain   . Dyslipidemia   . Chest pain   . Statin intolerance   . Mitral valve prolapse   . Ejection fraction   . Fibromyalgia   . ADENOMATOUS COLONIC POLYP 08/15/2007  . GERD 08/15/2007  . BARRETTS ESOPHAGUS 08/15/2007  . HIATAL  HERNIA 08/15/2007  . Osteoarthritis 08/15/2007  . CATARACT EXTRACTION, HX OF 08/15/2007    Past Medical History:  Diagnosis Date  . Anxiety    prev on prozac  . Chest pain    Felt to be noncardiac in the past  . Colon polyp   . Dyslipidemia   . Ejection fraction    EF 60%, echo, 2007, atrial septum bows left to right  compatible with increased left atrial pressure  . Fibromyalgia   . GERD (gastroesophageal reflux disease)    Barrett's esophagus  . Hiatal hernia   . Lumbar spine pain    Complex lumbar spine surgery at [facility], 4098, complicated, MRSA, much of the appliance is removed, patient on chronic antibiotics  . Melanoma ([place])   . Mitral regurgitation    Mild, 2007,  /  moderate or severe echo, September, 2012, eccentric jet wrapping the left atrium, consider TEE for further assessment  . Mitral valve prolapse    Mild mitral regurgitation echo, 2007  . Osteoarthritis   . Scoliosis   . Sinusitis    Multiple sinus operations in the past  . Statin intolerance    As of 2009, no further attempts to use statins  . Thyroid nodule     Family History  Problem Relation Age of Onset  . GER disease Mother   . Dementia Mother   . Breast cancer Mother   . Heart disease Father        MI at 51  . Scoliosis Daughter   . Colon cancer Neg Hx    Past Surgical History:  Procedure Laterality Date  . ABDOMINAL HYSTERECTOMY    . BACK SURGERY    . CHOLECYSTECTOMY    . FOOT SURGERY    . HERNIA REPAIR    . NASAL SINUS SURGERY     Social History   Social History Narrative   From [place]   Married (second [phone]   3 kids, 2 of whom are local   Retired, Camera operator     Objective: Vital Signs: BP 136/78 (BP Location: Left Arm, Patient Position: Sitting, Cuff Size: Normal)   Pulse 66   Resp 14   Ht 4\' 9"  (1.448 m)   Wt 148 lb (67.1 kg)   BMI 32.03 kg/m    Physical Exam  Constitutional: She is oriented to person, place, and time. She appears well-developed and well-nourished.  HENT:  Head: Normocephalic and atraumatic.  Eyes: Conjunctivae and EOM are normal.  Neck: Normal range of motion.  Cardiovascular: Normal rate, regular rhythm, normal heart sounds and intact distal pulses.  Pulmonary/Chest: Effort normal and breath sounds normal.  Abdominal: Soft. Bowel sounds are normal.    Lymphadenopathy:    She has no cervical adenopathy.  Neurological: She is alert and oriented to person, place, and time.  Skin: Skin is warm and dry. Capillary refill takes less than 2 seconds.  Psychiatric: She has a normal mood and affect. Her behavior is normal.  Nursing note and vitals reviewed.    Musculoskeletal Exam: C-spine limited ROM, thoracic, and lumbar limited ROM.  Shoulder joints, elbow joints, and wrist joints good ROM.  MCPs, PIPs, and DIPs good ROM with no synovitis.  She has significant PIP and DIP synovial thickening.  Hip joints slightly limited external rotation.  Discomfort with right knee ROM.  Crepitus of right knee.  Ankles, MTPs, PIPs, and DIPs good ROM with no synovitis.    CDAI Exam: No CDAI exam completed.  Investigation: No additional findings.   Imaging: Xr Knee 3 View Left  Result Date: 04/17/2017 Moderate medial compartment narrowing no chondrocalcinosis. Moderate patellofemoral narrowing. Impression: These findings are consistent with moderate osteoarthritis of the knee joint and moderate chondromalacia patella.  Xr Knee 3 View Right  Result Date: 04/17/2017 Moderate to severe medial compartment narrowing and moderate lateral compartment narrowing. No chondrocalcinosis was noted. Moderate patellofemoral narrowing was noted. Impression: Moderate to severe osteoarthritis and moderate chondromalacia patella.   Speciality Comments: No specialty comments available.    Procedures:  Large Joint Inj: R knee on 04/17/2017 4:23 PM Indications: pain Details: 27 G 1.5 in needle, medial approach  Arthrogram: No  Medications: 1.5 mL lidocaine 1 %; 40 mg triamcinolone acetonide 40 MG/ML Aspirate: 0 mL Outcome: tolerated well, no immediate complications Procedure, treatment alternatives, risks and benefits explained, specific risks discussed. Consent was given by the patient. Immediately prior to procedure a time out was called to verify the correct  patient, procedure, equipment, support staff and site/side marked as required. Patient was prepped and draped in the usual sterile fashion.     Allergies: Fenofibric acid; Nsaids; Statins; and Trilipix [choline fenofibrate]   Assessment / Plan:     Visit Diagnoses: Fibromyalgia: She has had no recent fibromyalgia flares.  She exercises about 3 times a week.  She will continue on Gabapentin 100 mg po 3 times daily.    Primary osteoarthritis of both hands: She has significant PIP and DIP synovial thickening.  Joint protection and muscle strengthening discussed.  She can continue using Voltaren gel on her hands.    Primary osteoarthritis of both knees - She is having discomfort in her right knee, especially with ROM and climbing stairs.  She had a right knee cortisone injection.  she tolerated the procedure well.  She was advised to call back if her left knee pain does not resolve and we can perform a left knee cortisone injection.   She had x-rays of her bilateral knees, which revealed moderate to severe osteoarthritis and chondromalaia patella.  A handout for knee exercises was provided to the patient.  Plan: Large Joint Inj: R knee  Chronic pain of both knees - Right knee cortisone injection was performed today. Plan: XR KNEE 3 VIEW RIGHT, XR KNEE 3 VIEW LEFT  Chronic pain of right knee - Right knee cortisone injection performed today.  Plan: Large Joint Inj: R knee   Primary osteoarthritis of both feet: She continues to have bilateral feet pain.  Proper fitting shoes were discussed.    Other fatigue: Chronic   Primary insomnia - insomnia has improved with current regimen.  DDD (degenerative disc disease), cervical - Worst level C6-7: She does have chronic pain she's concerned about having surgery as she had previous MRSA after her lumbar spine surgery.  DDD (degenerative disc disease), lumbar - status post fusion T11-S1 with iliac the screws 2012 by Dr. Owens Shark at [facility]. Pain is tolerable on  current regimen.  Osteopenia of multiple sites - T score -2.3 09/2015. Need for regular exercise calcium and vitamin D was discussed. Her DEXA scan was ordered today.    Other medical conditions are listed as follows:   History of mitral valve prolapse  History of hyperlipidemia  History of hypothyroidism  History of Barrett's esophagus  History of gastroesophageal reflux (GERD)  History of hiatal hernia  History of anemia    Orders: Orders Placed This Encounter  Procedures  . Large Joint Inj: R knee  . XR  KNEE 3 VIEW RIGHT  . XR KNEE 3 VIEW LEFT   No orders of the defined types were placed in this encounter.   Face-to-face time spent with patient was 30 minutes. Greater than 50% of time was spent in counseling and coordination of care.  Follow-Up Instructions: Return in about 6 months (around 10/15/2017) for Osteoarthritis, Fibromyalgia.    Note - This record has been created using [employer].  Chart creation errors have been sought, but may not always  have been located. Such creation errors do not reflect on  the standard of medical care.

## 2017-04-17 ENCOUNTER — Ambulatory Visit (INDEPENDENT_AMBULATORY_CARE_PROVIDER_SITE_OTHER): Payer: Self-pay

## 2017-04-17 ENCOUNTER — Encounter: Payer: Self-pay | Admitting: Rheumatology

## 2017-04-17 ENCOUNTER — Ambulatory Visit (INDEPENDENT_AMBULATORY_CARE_PROVIDER_SITE_OTHER): Payer: Medicare Other

## 2017-04-17 ENCOUNTER — Ambulatory Visit (INDEPENDENT_AMBULATORY_CARE_PROVIDER_SITE_OTHER): Payer: Medicare Other | Admitting: Rheumatology

## 2017-04-17 ENCOUNTER — Telehealth: Payer: Self-pay

## 2017-04-17 VITALS — BP 136/78 | HR 66 | Resp 14 | Ht <= 58 in | Wt 148.0 lb

## 2017-04-17 DIAGNOSIS — M19041 Primary osteoarthritis, right hand: Secondary | ICD-10-CM | POA: Diagnosis not present

## 2017-04-17 DIAGNOSIS — F5101 Primary insomnia: Secondary | ICD-10-CM

## 2017-04-17 DIAGNOSIS — M8589 Other specified disorders of bone density and structure, multiple sites: Secondary | ICD-10-CM | POA: Diagnosis not present

## 2017-04-17 DIAGNOSIS — M25562 Pain in left knee: Secondary | ICD-10-CM

## 2017-04-17 DIAGNOSIS — M19072 Primary osteoarthritis, left ankle and foot: Secondary | ICD-10-CM

## 2017-04-17 DIAGNOSIS — G8929 Other chronic pain: Secondary | ICD-10-CM

## 2017-04-17 DIAGNOSIS — Z8719 Personal history of other diseases of the digestive system: Secondary | ICD-10-CM | POA: Diagnosis not present

## 2017-04-17 DIAGNOSIS — R5383 Other fatigue: Secondary | ICD-10-CM | POA: Diagnosis not present

## 2017-04-17 DIAGNOSIS — Z862 Personal history of diseases of the blood and blood-forming organs and certain disorders involving the immune mechanism: Secondary | ICD-10-CM

## 2017-04-17 DIAGNOSIS — M25561 Pain in right knee: Secondary | ICD-10-CM

## 2017-04-17 DIAGNOSIS — M19071 Primary osteoarthritis, right ankle and foot: Secondary | ICD-10-CM

## 2017-04-17 DIAGNOSIS — M5136 Other intervertebral disc degeneration, lumbar region: Secondary | ICD-10-CM

## 2017-04-17 DIAGNOSIS — Z8639 Personal history of other endocrine, nutritional and metabolic disease: Secondary | ICD-10-CM

## 2017-04-17 DIAGNOSIS — M5030 Other cervical disc degeneration, unspecified cervical region: Secondary | ICD-10-CM

## 2017-04-17 DIAGNOSIS — M797 Fibromyalgia: Secondary | ICD-10-CM

## 2017-04-17 DIAGNOSIS — M19042 Primary osteoarthritis, left hand: Secondary | ICD-10-CM

## 2017-04-17 DIAGNOSIS — Z8679 Personal history of other diseases of the circulatory system: Secondary | ICD-10-CM

## 2017-04-17 DIAGNOSIS — M170 Bilateral primary osteoarthritis of knee: Secondary | ICD-10-CM

## 2017-04-17 NOTE — Telephone Encounter (Signed)
Was asked to submit a prior authorization for Pennsaid for pt. Authorization was submitted to insurance via cover my meds. Will update once we receive a response.   Thyra Yinger, [place], CPhT 4:34 PM

## 2017-04-17 NOTE — Telephone Encounter (Signed)
Received a fax from [facility] regarding a prior authorization approval for Voltaren Gel from 04/16/2017 to 04/16/2020.   Reference number: [id number] Phone number: [phone]  Will send document to scan center.  Called pt to update. Left message.  Viraaj Vorndran, [place], CPhT 10:59 AM

## 2017-04-17 NOTE — Patient Instructions (Signed)

## 2017-04-18 ENCOUNTER — Other Ambulatory Visit: Payer: Self-pay | Admitting: Cardiovascular Disease

## 2017-04-18 MED ORDER — TRIAMCINOLONE ACETONIDE 40 MG/ML IJ SUSP
40.0000 mg | INTRAMUSCULAR | Status: AC | PRN
  Administered 2017-04-17: 40 mg via INTRA_ARTICULAR

## 2017-04-18 MED ORDER — LIDOCAINE HCL 1 % IJ SOLN
1.5000 mL | INTRAMUSCULAR | Status: AC | PRN
  Administered 2017-04-17: 1.5 mL

## 2017-04-18 NOTE — Telephone Encounter (Signed)
Received a fax from [place] regarding a prior authorization [place] for Pennsaid solution. Medication is approved when pt has had a trial and failure of 3 more alternatives (diclofenac sodium gel 1%, diclofenac sodium solution, meloxicam and naproxen. Patient has had an inadequate response to diclofenac gel and had an intolerance to oral antiinflammatories. Called [facility] to clarify. Spoke with Darrick Penna who was able to re-submit the request.  Pennsaid has been approved from 04/18/17 through 04/18/2018. An approval letter will be faxed to clinic.   Reference number: [id number] Phone number: [phone]  Will send document to scan center.  Called patient to update. She voices understanding and denies any questions at this time.  Keshun Berrett, [place], CPhT 10:15 AM

## 2017-04-22 ENCOUNTER — Ambulatory Visit
Admission: RE | Admit: 2017-04-22 | Discharge: 2017-04-22 | Disposition: A | Discharge status: Home / Self Care | Payer: Medicare Other | Source: Ambulatory Visit | Attending: Primary Care | Admitting: Primary Care

## 2017-04-22 DIAGNOSIS — Z1231 Encounter for screening mammogram for malignant neoplasm of breast: Secondary | ICD-10-CM

## 2017-04-29 ENCOUNTER — Other Ambulatory Visit: Payer: Medicare Other

## 2017-04-29 ENCOUNTER — Other Ambulatory Visit: Payer: Self-pay | Admitting: *Deleted

## 2017-04-29 DIAGNOSIS — Z79890 Hormone replacement therapy (postmenopausal): Secondary | ICD-10-CM

## 2017-04-29 MED ORDER — ESTROGENS CONJUGATED 0.3 MG PO TABS: 0.3000 mg | ORAL_TABLET | Freq: Every day | ORAL | 1 refills | Status: DC

## 2017-04-30 ENCOUNTER — Other Ambulatory Visit (INDEPENDENT_AMBULATORY_CARE_PROVIDER_SITE_OTHER): Payer: Medicare Other

## 2017-04-30 DIAGNOSIS — N289 Disorder of kidney and ureter, unspecified: Secondary | ICD-10-CM

## 2017-04-30 DIAGNOSIS — D649 Anemia, unspecified: Secondary | ICD-10-CM | POA: Diagnosis not present

## 2017-04-30 LAB — CBC
HEMATOCRIT: 36.1 % (ref 36.0–46.0)
Hemoglobin: 11.7 g/dL — ABNORMAL LOW (ref 12.0–15.0)
MCHC: 32.4 g/dL (ref 30.0–36.0)
MCV: 98.8 fl (ref 78.0–100.0)
Platelets: 238 10*3/uL (ref 150.0–400.0)
RBC: 3.65 Mil/uL — ABNORMAL LOW (ref 3.87–5.11)
RDW: 14.1 % (ref 11.5–15.5)
WBC: 4.7 10*3/uL (ref 4.0–10.5)

## 2017-04-30 LAB — BASIC METABOLIC PANEL
BUN: 27 mg/dL — AB (ref 6–23)
CALCIUM: 9.4 mg/dL (ref 8.4–10.5)
CO2: 32 mEq/L (ref 19–32)
CREATININE: 1.1 mg/dL (ref 0.40–1.20)
Chloride: 101 mEq/L (ref 96–112)
GFR: 50.8 mL/min — ABNORMAL LOW (ref >60.00)
Glucose, Bld: 74 mg/dL (ref 70–99)
Potassium: 4 mEq/L (ref 3.5–5.1)
Sodium: 140 mEq/L (ref 135–145)

## 2017-05-07 DIAGNOSIS — Z48813 Encounter for surgical aftercare following surgery on the respiratory system: Secondary | ICD-10-CM | POA: Diagnosis not present

## 2017-05-07 DIAGNOSIS — J310 Chronic rhinitis: Secondary | ICD-10-CM | POA: Diagnosis not present

## 2017-05-07 DIAGNOSIS — J323 Chronic sphenoidal sinusitis: Secondary | ICD-10-CM | POA: Diagnosis not present

## 2017-05-09 DIAGNOSIS — Z85820 Personal history of malignant melanoma of skin: Secondary | ICD-10-CM | POA: Diagnosis not present

## 2017-05-09 DIAGNOSIS — D2271 Melanocytic nevi of right lower limb, including hip: Secondary | ICD-10-CM | POA: Diagnosis not present

## 2017-05-09 DIAGNOSIS — D225 Melanocytic nevi of trunk: Secondary | ICD-10-CM | POA: Diagnosis not present

## 2017-05-09 DIAGNOSIS — L821 Other seborrheic keratosis: Secondary | ICD-10-CM | POA: Diagnosis not present

## 2017-05-12 ENCOUNTER — Telehealth: Payer: Self-pay | Admitting: Cardiovascular Disease

## 2017-05-12 NOTE — Telephone Encounter (Signed)
Pt came into office today with her spouse who is now see Dr Rockey Situ  She is requesting to switch over to Dr Rockey Situ, for it is much closer to their home   Is this okay to do  Please advise

## 2017-05-12 NOTE — Telephone Encounter (Signed)
Okay; thanks.

## 2017-05-12 NOTE — Telephone Encounter (Signed)
OK with me.  Thanks. 

## 2017-05-13 NOTE — Telephone Encounter (Signed)
Pt scheduled to see Dr Rockey Situ on  07/08/17   Nothing else needed.

## 2017-05-21 ENCOUNTER — Other Ambulatory Visit: Payer: Self-pay | Admitting: Rheumatology

## 2017-05-21 NOTE — Telephone Encounter (Signed)
Ok to refill 

## 2017-05-21 NOTE — Telephone Encounter (Signed)
Last visit: 04/17/2017 Next visit: 09/19/2017  Last fill: 08/09/2016  Okay to refill gabapentin?

## 2017-06-26 ENCOUNTER — Ambulatory Visit: Payer: Medicare Other | Admitting: Cardiovascular Disease

## 2017-07-04 NOTE — Progress Notes (Signed)
Cardiology Office Note  Date:  07/08/2017   ID:  [name] [name], DOB 11/16/1935, MRN [id number]  PCP:  [name], NP   Chief Complaint  Patient presents with  . other    1 year follow up. Former Dr. Angelena Form patient for mitral regurgitation. Pt. c/o occas. rapid heart beats.     HPI:  82 yo female with history of  HLD, statin intolerance,  GERD,  hiatal hernia, fibromyalgia  MRSA at [facility] hospital 2012 Back surgery x 5 2 surgery to "clean out the MRSA", took out "all the metal" "pus pocket size of pus pocket in ABD" bactum to suppress Endocarditis? mitral valve disease , Mild to moderate regurgitation July 2016 Who presents for follow-up of her mitral valve disease, hyperlipidemia  echo July 2016 with normal LV systolic function, mild to moderate mitral regurgitation.   Echo 2018 - Normal LV systolic function; mild diastolic dysfunction; mild MR.   feels well today, limited by chronic back pain She has no chest pain or SOB.No near syncope, syncope or LE edema.   [employer] 3 days a week Back pain, neck pain, does not want surgery out of fear of her MRSA recurring Was told at some point she will need to have neck surgery before symptoms get severe  EKG personally reviewed by myself on todays visit  shows Normal sinus rhythm with rate 74 bpm no significant ST or T wave changes    PMH:   has a past medical history of Anxiety, Chest pain, Colon polyp, Dyslipidemia, Ejection fraction, Fibromyalgia, GERD (gastroesophageal reflux disease), Hiatal hernia, Lumbar spine pain, Melanoma (HCC), Mitral regurgitation, Mitral valve prolapse, Osteoarthritis, Scoliosis, Sinusitis, Statin intolerance, and Thyroid nodule.  PSH:    Past Surgical History:  Procedure Laterality Date  . ABDOMINAL HYSTERECTOMY    . BACK SURGERY    . CHOLECYSTECTOMY    . FOOT SURGERY    . HERNIA REPAIR    . NASAL SINUS SURGERY      Current Outpatient Medications  Medication Sig Dispense Refill  .  ALPHA LIPOIC ACID PO Take 1 tablet by mouth daily. Daily     . Biotin 10 MG TABS Take 1 tablet by mouth daily. Daily     . calcium carbonate (OS-CAL) 600 MG TABS Take 1,200 mg by mouth 2 (two) times daily with a meal.    . cholecalciferol (VITAMIN D) 1000 UNITS tablet Take 1,000 Units by mouth daily.    . diclofenac sodium (VOLTAREN) 1 % GEL 3 grams to 3 large joints up to 3 times daily 3 Tube 3  . estrogens, conjugated, (PREMARIN) 0.3 MG tablet Take 1 tablet (0.3 mg total) by mouth daily. 90 tablet 1  . fenofibrate micronized (LOFIBRA) 134 MG capsule TAKE 1 CAPSULE (134 MG TOTAL) BY MOUTH AT BEDTIME. 90 capsule 2  . fish oil-omega-3 fatty acids 1000 MG capsule Take 2 g by mouth daily.    Marland Kitchen gabapentin (NEURONTIN) 100 MG capsule TAKE 1 CAPSULE (100 MG TOTAL) BY MOUTH 3 (THREE) TIMES DAILY. 180 capsule 0  . Ginger, Zingiber officinalis, (GINGER PO) Take 1 tablet by mouth daily.    Marland Kitchen levothyroxine (SYNTHROID, LEVOTHROID) 50 MCG tablet TAKE 1 TABLET BY MOUTH EVERY MORNING ON AN EMPTY STOMACH WITH A FULL GLASS OF WATER. 90 tablet 1  . metoprolol tartrate (LOPRESSOR) 50 MG tablet TAKE 1 TABLET ( 50 MG TOTAL) BY MOUTH IN THE MORNING AND 1/2 TABLET BY MOUTH IN THE EVENING. Please keep upcoming appt in March.  Thank you 135 tablet 3  . NON FORMULARY Take 1 capsule by mouth daily. Tumeric      Daily     . sulfamethoxazole-trimethoprim (BACTRIM DS) 800-160 MG per tablet Take 1 tablet by mouth 2 (two) times daily.     Marland Kitchen tretinoin (RETIN-A) 0.05 % cream Apply 1 application topically daily as needed (as directed per dermatologist).   3   No current facility-administered medications for this visit.      Allergies:   Other; Fenofibric acid; Nsaids; Statins; and Trilipix [choline fenofibrate]   Social History:  The patient  reports that she has never smoked. She has never used smokeless tobacco. She reports that she does not drink alcohol or use drugs.   Family History:   family history includes Breast  cancer in her mother; Dementia in her mother; GER disease in her mother; Heart disease in her father; Scoliosis in her daughter.    Review of Systems: Review of Systems  Constitutional: Negative.   Respiratory: Negative.   Cardiovascular: Negative.   Gastrointestinal: Negative.   Musculoskeletal: Positive for back pain.  Neurological: Negative.   Psychiatric/Behavioral: Negative.   All other systems reviewed and are negative.    PHYSICAL EXAM: VS:  BP 122/74 (BP Location: Left Arm, Patient Position: Sitting, Cuff Size: Normal)   Pulse 74   Ht 4\' 9"  (1.448 m)   Wt 147 lb 8 oz (66.9 kg)   BMI 31.92 kg/m  , BMI Body mass index is 31.92 kg/m. GEN: Well nourished, well developed, in no acute distress  HEENT: normal  Neck: no JVD, carotid bruits, or masses Cardiac: RRR; 1-2+ SEM RSB,  no rubs, or gallops,no edema  Respiratory:  clear to auscultation bilaterally, normal work of breathing GI: soft, nontender, nondistended, + BS MS: no deformity or atrophy  Skin: warm and dry, no rash Neuro:  Strength and sensation are intact Psych: euthymic mood, full affect    Recent Labs: 01/27/2017: ALT 17; TSH 1.82 04/30/2017: BUN 27; Creatinine, Ser 1.10; Hemoglobin 11.7; Platelets 238.0; Potassium 4.0; Sodium 140    Lipid Panel Lab Results  Component Value Date   CHOL 157 01/27/2017   HDL 70.20 01/27/2017   LDLCALC 63 01/27/2017   TRIG 119.0 01/27/2017      Wt Readings from Last 3 Encounters:  07/08/17 147 lb 8 oz (66.9 kg)  04/17/17 148 lb (67.1 kg)  03/21/17 147 lb (66.7 kg)       ASSESSMENT AND PLAN:  Non-rheumatic mitral regurgitation Mitral valve regurgitation No further echo needed Murmur coming from aortic valve sclerosis without stenosis  Palpitations - Plan: EKG 12-Lead No significant palpitations noted  Precordial pain Denies significant pain on today's visit, no further workup  Dyslipidemia Currently not on a statin, previously tried Zetia had  diarrhea  Fibromyalgia mild Managed by primary care  Back pain Numerous back surgeries 6301 at [facility] complication with MRSA  Disposition:   F/U  6 months   Orders Placed This Encounter  Procedures  . EKG 12-Lead     Signed, Esmond Plants, M.D., Ph.D. 07/08/2017  [place], [place]

## 2017-07-08 ENCOUNTER — Encounter: Payer: Self-pay | Admitting: Cardiovascular Disease

## 2017-07-08 ENCOUNTER — Ambulatory Visit (INDEPENDENT_AMBULATORY_CARE_PROVIDER_SITE_OTHER): Payer: Medicare Other | Admitting: Cardiovascular Disease

## 2017-07-08 VITALS — BP 122/74 | HR 74 | Ht <= 58 in | Wt 147.5 lb

## 2017-07-08 DIAGNOSIS — R072 Precordial pain: Secondary | ICD-10-CM | POA: Diagnosis not present

## 2017-07-08 DIAGNOSIS — M47812 Spondylosis without myelopathy or radiculopathy, cervical region: Secondary | ICD-10-CM

## 2017-07-08 DIAGNOSIS — M797 Fibromyalgia: Secondary | ICD-10-CM | POA: Diagnosis not present

## 2017-07-08 DIAGNOSIS — R002 Palpitations: Secondary | ICD-10-CM

## 2017-07-08 DIAGNOSIS — I340 Nonrheumatic mitral (valve) insufficiency: Secondary | ICD-10-CM | POA: Diagnosis not present

## 2017-07-08 DIAGNOSIS — E785 Hyperlipidemia, unspecified: Secondary | ICD-10-CM | POA: Diagnosis not present

## 2017-07-08 MED ORDER — METOPROLOL TARTRATE 50 MG PO TABS: ORAL_TABLET | ORAL | 3 refills | Status: DC

## 2017-07-08 NOTE — Patient Instructions (Signed)

## 2017-07-28 NOTE — Progress Notes (Signed)
Office Visit Note  Patient: [name] [name]             Date of Birth: 02-25-1936           MRN: [id number]             PCP: [name], NP Referring: [name], NP Visit Date: 07/29/2017 Occupation: @GUAROCC @    Subjective:  Right knee pain    History of Present Illness: [name] [name] is a 82 y.o. female with history of fibromyalgia, osteoarthritis, and DDD.  Patient states that her fibromyalgia has been stable.  She denies any recent flares.  She states that she continues to have occasional muscle aches and muscle tenderness.  She states that the majority of her pain is coming from her bilateral knees.  She states that her right knee has been increasingly more painful and swollen.  She states that her last cortisone injection on 04/17/2017 provided relief for about 2.5 months.  She would like to reapply for Euflexxa knee injections.  She states she continues to have chronic pain in bilateral shoulders, lower back, lateral hands and bilateral feet.    Activities of Daily Living:  Patient reports morning stiffness for 5 minutes.   Patient Reports nocturnal pain.  Difficulty dressing/grooming: Denies Difficulty climbing stairs: Denies Difficulty getting out of chair: Denies Difficulty using hands for taps, buttons, cutlery, and/or writing: Reports   Review of Systems  Constitutional: Negative for fatigue.  HENT: Negative for mouth sores, mouth dryness and nose dryness.   Eyes: Negative for pain, visual disturbance and dryness.  Respiratory: Negative for cough, hemoptysis, shortness of breath and difficulty breathing.   Cardiovascular: Negative for chest pain, palpitations, hypertension and swelling in legs/feet.  Gastrointestinal: Negative for abdominal pain, blood in stool, constipation and diarrhea.  Endocrine: Negative for increased urination.  Genitourinary: Negative for painful urination and pelvic pain.  Musculoskeletal: Positive for arthralgias, joint pain and  joint swelling. Negative for myalgias, muscle weakness, morning stiffness, muscle tenderness and myalgias.  Skin: Negative for color change, pallor, rash, hair loss, nodules/bumps, skin tightness, ulcers and sensitivity to sunlight.  Allergic/Immunologic: Negative for susceptible to infections.  Neurological: Negative for dizziness, numbness and weakness.  Hematological: Negative for swollen glands.  Psychiatric/Behavioral: Negative for depressed mood and sleep disturbance. The patient is not nervous/anxious.     PMFS History:  Patient Active Problem List   Diagnosis Date Noted  . Decreased renal function 01/29/2017  . Anemia 01/29/2017  . Preventative health care 01/29/2017  . Other fatigue 09/03/2016  . Primary insomnia 09/03/2016  . Primary osteoarthritis of both knees 09/03/2016  . DJD (degenerative joint disease), cervical 09/03/2016  . Osteopenia 03/04/2016  . Palpitations 09/19/2014  . Thyroid nodule, uninodular 09/18/2011  . Hypothyroid 08/19/2011  . Postmenopausal HRT (hormone replacement therapy) 08/13/2011  . Mitral regurgitation   . Lumbar spine pain   . Dyslipidemia   . Chest pain   . Statin intolerance   . Mitral valve prolapse   . Ejection fraction   . Fibromyalgia   . ADENOMATOUS COLONIC POLYP 08/15/2007  . GERD 08/15/2007  . BARRETTS ESOPHAGUS 08/15/2007  . HIATAL HERNIA 08/15/2007  . Osteoarthritis 08/15/2007  . CATARACT EXTRACTION, HX OF 08/15/2007    Past Medical History:  Diagnosis Date  . Anxiety    prev on prozac  . Chest pain    Felt to be noncardiac in the past  . Colon polyp   . Dyslipidemia   . Ejection fraction  EF 60%, echo, 2007, atrial septum bows left to right compatible with increased left atrial pressure  . Fibromyalgia   . GERD (gastroesophageal reflux disease)    Barrett's esophagus  . Hiatal hernia   . Lumbar spine pain    Complex lumbar spine surgery at [facility], 8413, complicated, MRSA, much of the appliance is removed, patient  on chronic antibiotics  . Melanoma ([place])   . Mitral regurgitation    Mild, 2007,  /  moderate or severe echo, September, 2012, eccentric jet wrapping the left atrium, consider TEE for further assessment  . Mitral valve prolapse    Mild mitral regurgitation echo, 2007  . Osteoarthritis   . Scoliosis   . Sinusitis    Multiple sinus operations in the past  . Statin intolerance    As of 2009, no further attempts to use statins  . Thyroid nodule     Family History  Problem Relation Age of Onset  . GER disease Mother   . Dementia Mother   . Breast cancer Mother   . Heart disease Father        MI at 63  . Scoliosis Daughter   . Colon cancer Neg Hx    Past Surgical History:  Procedure Laterality Date  . ABDOMINAL HYSTERECTOMY    . BACK SURGERY    . CHOLECYSTECTOMY    . FOOT SURGERY    . HERNIA REPAIR    . NASAL SINUS SURGERY     Social History   Social History Narrative   From [place]   Married (second [phone]   3 kids, 2 of whom are local   Retired, Camera operator     Objective: Vital Signs: BP 120/64 (BP Location: Left Arm, Patient Position: Sitting, Cuff Size: Normal)   Pulse 66   Resp 14   Ht 4\' 9"  (1.448 m)   Wt 148 lb (67.1 kg)   BMI 32.03 kg/m    Physical Exam  Constitutional: She is oriented to person, place, and time. She appears well-developed and well-nourished.  HENT:  Head: Normocephalic and atraumatic.  Eyes: Conjunctivae and EOM are normal.  Neck: Normal range of motion.  Cardiovascular: Normal rate, regular rhythm, normal heart sounds and intact distal pulses.  Pulmonary/Chest: Effort normal and breath sounds normal.  Abdominal: Soft. Bowel sounds are normal.  Lymphadenopathy:    She has no cervical adenopathy.  Neurological: She is alert and oriented to person, place, and time.  Skin: Skin is warm and dry. Capillary refill takes less than 2 seconds.  Psychiatric: She has a normal mood and affect. Her behavior is normal.  Nursing  note and vitals reviewed.    Musculoskeletal Exam: She has limited range of motion of her C-spine.  She has limited thoracic and lumbar spine range of motion.  She has midline spinal tenderness in the lumbar region.  No SI joint tenderness.  She is some limited range of motion with abduction of bilateral shoulders.  Elbow joints, wrist joints, MCPs, PIPs, DIPs good range of motion.  She has inflammation and synovial thickening of all PIP and DIP joints.  No tenderness or synovitis of MCP joints.  Hip joints good ROM.  Right knee warmth and limited extension.  Left knee no warmth or effusion.  She has bilateral knee crepitus.  MTPs, PIPs, and DIPs good ROM with no synovitis.    CDAI Exam: No CDAI exam completed.    Investigation: No additional findings. CBC Latest Ref Rng & Units 04/30/2017  WBC 4.0 - 10.5 K/uL 4.7  Hemoglobin 12.0 - 15.0 g/dL 11.7(L)  Hematocrit 36.0 - 46.0 % 36.1  Platelets 150.0 - 400.0 K/uL 238.0   CMP Latest Ref Rng & Units 04/30/2017 01/27/2017 12/17/2007  Glucose 70 - 99 mg/dL 74 96 -  BUN 6 - 23 mg/dL 27(H) 19 -  Creatinine 0.40 - 1.20 mg/dL 1.10 1.09 -  Sodium 135 - 145 mEq/L 140 139 -  Potassium 3.5 - 5.1 mEq/L 4.0 4.3 -  Chloride 96 - 112 mEq/L 101 101 -  CO2 19 - 32 mEq/L 32 30 -  Calcium 8.4 - 10.5 mg/dL 9.4 9.6 -  Total Protein 6.0 - 8.3 g/dL - 6.8 6.6  Total Bilirubin 0.2 - 1.2 mg/dL - 0.4 0.8  Alkaline Phos 39 - 117 U/L - 35(L) 55  AST 0 - 37 U/L - 20 18  ALT 0 - 35 U/L - 17 16    Imaging: No results found.  Speciality Comments: No specialty comments available.    Procedures:  Large Joint Inj: R knee on 07/29/2017 2:48 PM Indications: pain Details: 27 G 1.5 in needle, medial approach  Arthrogram: No  Medications: 1.5 mL lidocaine 1 %; 40 mg triamcinolone acetonide 40 MG/ML Aspirate: 0 mL Outcome: tolerated well, no immediate complications Procedure, treatment alternatives, risks and benefits explained, specific risks discussed. Consent was  given by the patient. Immediately prior to procedure a time out was called to verify the correct patient, procedure, equipment, support staff and site/side marked as required. Patient was prepped and draped in the usual sterile fashion.     Allergies: Other; Fenofibric acid; Nsaids; Statins; and Trilipix [choline fenofibrate]   Assessment / Plan:     Visit Diagnoses: Fibromyalgia: Her fibromyalgia has been stable.  She has not had any recent fibromyalgia flares.  She continues to have mild muscle tenderness and muscle aches.  She is encouraged to continue to exercise on a regular basis.  She will continue taking gabapentin 100 mg by mouth 3 times a day.  Primary osteoarthritis of both hands: She has significant PIP and DIP synovial thickening consistent with osteoarthritis.  She experiences discomfort in her PIP and DIP joints.  She also has pain in her bilateral CMC joints.  Joint protection and muscle strengthening were discussed.  She uses Voltaren gel occasionally on her hands.  Primary osteoarthritis of both knees: She has bilateral knee discomfort.  The pain is most severe in her right knee.  She has warmth of her right knee on exam.  She has bilateral knee crepitus.  She had a right knee cortisone injection on 04/17/2017 which provided relief for 2.5 months.  She requested a cortisone injection today in the office.  She tolerated the procedure well.  She was advised to rest, ice, and elevate her right knee.  She was also advised to keep the injection site clean and dry.  Other fatigue: Chronic.  Primary insomnia: Improving.  DDD (degenerative disc disease), cervical: Chronic pain.  She has limited range of motion with lateral rotation of her C-spine.  DDD (degenerative disc disease), lumbar - S/p fusion T11-S1: Chronic pain.  She has midline spinal tenderness in the lumbar region.  Osteopenia of multiple sites - T score -2.3 09/2015.  Other medical conditions are listed as  follows:  History of mitral valve prolapse  History of hyperlipidemia  History of hypothyroidism  History of Barrett's esophagus  History of gastroesophageal reflux (GERD)  History of hiatal hernia  History of anemia  Orders: Orders Placed This Encounter  Procedures  . Large Joint Inj: R knee   No orders of the defined types were placed in this encounter.   Face-to-face time spent with patient was 30 minutes. >50% of time was spent in counseling and coordination of care.  Follow-Up Instructions: Return in about 6 months (around 01/28/2018) for Fibromyalgia, Osteoarthritis.   Ofilia Neas, PA-C  Note - This record has been created using Dragon software.  Chart creation errors have been sought, but may not always  have been located. Such creation errors do not reflect on  the standard of medical care.

## 2017-07-29 ENCOUNTER — Ambulatory Visit (INDEPENDENT_AMBULATORY_CARE_PROVIDER_SITE_OTHER): Payer: Medicare Other | Admitting: Physician Assistant

## 2017-07-29 ENCOUNTER — Encounter: Payer: Self-pay | Admitting: Physician Assistant

## 2017-07-29 VITALS — BP 120/64 | HR 66 | Resp 14 | Ht <= 58 in | Wt 148.0 lb

## 2017-07-29 DIAGNOSIS — Z8719 Personal history of other diseases of the digestive system: Secondary | ICD-10-CM

## 2017-07-29 DIAGNOSIS — M797 Fibromyalgia: Secondary | ICD-10-CM | POA: Diagnosis not present

## 2017-07-29 DIAGNOSIS — M5136 Other intervertebral disc degeneration, lumbar region: Secondary | ICD-10-CM

## 2017-07-29 DIAGNOSIS — G8929 Other chronic pain: Secondary | ICD-10-CM | POA: Diagnosis not present

## 2017-07-29 DIAGNOSIS — F5101 Primary insomnia: Secondary | ICD-10-CM | POA: Diagnosis not present

## 2017-07-29 DIAGNOSIS — M19042 Primary osteoarthritis, left hand: Secondary | ICD-10-CM

## 2017-07-29 DIAGNOSIS — M170 Bilateral primary osteoarthritis of knee: Secondary | ICD-10-CM

## 2017-07-29 DIAGNOSIS — Z8679 Personal history of other diseases of the circulatory system: Secondary | ICD-10-CM

## 2017-07-29 DIAGNOSIS — R5383 Other fatigue: Secondary | ICD-10-CM | POA: Diagnosis not present

## 2017-07-29 DIAGNOSIS — Z8639 Personal history of other endocrine, nutritional and metabolic disease: Secondary | ICD-10-CM

## 2017-07-29 DIAGNOSIS — M19041 Primary osteoarthritis, right hand: Secondary | ICD-10-CM | POA: Diagnosis not present

## 2017-07-29 DIAGNOSIS — M25561 Pain in right knee: Secondary | ICD-10-CM

## 2017-07-29 DIAGNOSIS — M5030 Other cervical disc degeneration, unspecified cervical region: Secondary | ICD-10-CM

## 2017-07-29 DIAGNOSIS — Z862 Personal history of diseases of the blood and blood-forming organs and certain disorders involving the immune mechanism: Secondary | ICD-10-CM

## 2017-07-29 DIAGNOSIS — M8589 Other specified disorders of bone density and structure, multiple sites: Secondary | ICD-10-CM

## 2017-07-29 MED ORDER — LIDOCAINE HCL 1 % IJ SOLN
1.5000 mL | INTRAMUSCULAR | Status: AC | PRN
  Administered 2017-07-29: 1.5 mL

## 2017-07-29 MED ORDER — TRIAMCINOLONE ACETONIDE 40 MG/ML IJ SUSP
40.0000 mg | INTRAMUSCULAR | Status: AC | PRN
  Administered 2017-07-29: 40 mg via INTRA_ARTICULAR

## 2017-07-30 ENCOUNTER — Ambulatory Visit: Payer: Medicare Other | Admitting: Physician Assistant

## 2017-08-04 ENCOUNTER — Telehealth: Payer: Self-pay | Admitting: Rheumatology

## 2017-08-04 NOTE — Telephone Encounter (Signed)
Returned the call to [facility] and provided them with diagnosis code and also verified the prescription. They will now contact the patient.

## 2017-08-04 NOTE — Telephone Encounter (Signed)
QGBEEFE from [facility] called stating they need a diagnosis code for patient so they can process the Euflexxa prescription.  Please call [phone]

## 2017-08-08 ENCOUNTER — Telehealth: Payer: Self-pay | Admitting: Rheumatology

## 2017-08-08 NOTE — Telephone Encounter (Signed)
Patient states she got a message stating her gel injections have been approved, and she needs to call to set up delivery. Patient usually has them sent here. Please call patient to advise.

## 2017-08-08 NOTE — Telephone Encounter (Signed)
I called patient, Euflexxa will be mailed to office

## 2017-08-12 ENCOUNTER — Telehealth: Payer: Self-pay | Admitting: Rheumatology

## 2017-08-12 NOTE — Telephone Encounter (Signed)
[facility] called checking on the status of  authorization for patient's Euflexxa.  Please call [phone]

## 2017-08-13 ENCOUNTER — Telehealth: Payer: Self-pay | Admitting: Rheumatology

## 2017-08-13 NOTE — Telephone Encounter (Signed)
Patient states she got notice of authorization for Gel injections. Just checking to see if we have received approval, and if she is okay to schedule appts. Please call to inform. If patient unable to answer phone, please leave a detailed message on phone.

## 2017-08-14 ENCOUNTER — Telehealth: Payer: Self-pay | Admitting: Rheumatology

## 2017-08-14 NOTE — Telephone Encounter (Signed)
Just FYI: [facility] will not be sending out Euflexxa due to it not being approved.

## 2017-08-18 ENCOUNTER — Telehealth: Payer: Self-pay | Admitting: Rheumatology

## 2017-08-18 NOTE — Telephone Encounter (Signed)
[name] [name] from [facility] called stating they scheduled the Gel 1 delivery for tomorrow 08/19/17.  If you have any questions, please call [phone]

## 2017-08-18 NOTE — Telephone Encounter (Signed)
I called patient, [name] [name] to be delivered on 08/19/17, will call patient to schedule appt when [name] [name] arrives.

## 2017-08-21 ENCOUNTER — Ambulatory Visit: Payer: Medicare Other | Admitting: Physician Assistant

## 2017-08-22 ENCOUNTER — Ambulatory Visit (INDEPENDENT_AMBULATORY_CARE_PROVIDER_SITE_OTHER): Payer: Medicare Other | Admitting: Physician Assistant

## 2017-08-22 DIAGNOSIS — M170 Bilateral primary osteoarthritis of knee: Secondary | ICD-10-CM

## 2017-08-22 MED ORDER — CROSS-LINK HYAL ACID (VISC) 30 MG/3ML IX PRSY
30.0000 mg | PREFILLED_SYRINGE | INTRA_ARTICULAR | Status: AC | PRN
  Administered 2017-08-22: 30 mg via INTRA_ARTICULAR

## 2017-08-22 MED ORDER — LIDOCAINE HCL 1 % IJ SOLN
1.5000 mL | INTRAMUSCULAR | Status: AC | PRN
  Administered 2017-08-22: 1.5 mL

## 2017-08-22 NOTE — Progress Notes (Signed)
   Procedure Note  Patient: [name] [name]             Date of Birth: 1936/01/13           MRN: [id number]             Visit Date: 08/22/2017  Procedures: Visit Diagnoses: No diagnosis found. Gel One p/p bilateral knees No procedures performed

## 2017-08-22 NOTE — Progress Notes (Signed)
   Procedure Note  Patient: [name] [name]             Date of Birth: 07-21-1935           MRN: [id number]             Visit Date: 08/22/2017  Procedures: Visit Diagnoses: Primary osteoarthritis of both knees - Plan: Large Joint Inj: bilateral knee  Bilateral knee Gel One injections     Large Joint Inj: bilateral knee on 08/22/2017 11:11 AM Indications: pain Details: 27 G 1.5 in needle, medial approach  Arthrogram: No  Medications (Right): 30 mg Cross-Linked Hyaluronate 30 MG/3ML; 1.5 mL lidocaine 1 % Aspirate (Right): 0 mL Medications (Left): 30 mg Cross-Linked Hyaluronate 30 MG/3ML; 1.5 mL lidocaine 1 % Aspirate (Left): 0 mL Outcome: tolerated well, no immediate complications Procedure, treatment alternatives, risks and benefits explained, specific risks discussed. Consent was given by the patient. Immediately prior to procedure a time out was called to verify the correct patient, procedure, equipment, support staff and site/side marked as required. Patient was prepped and draped in the usual sterile fashion.     Patient tolerated the procedure well.   Hazel Sams, PA-C

## 2017-09-05 NOTE — Progress Notes (Signed)
Office Visit Note  Patient: [name] [name]             Date of Birth: 21-Aug-1935           MRN: [id number]             PCP: [name], NP Referring: [name], NP Visit Date: 09/19/2017 Occupation: @GUAROCC @    Subjective:  Right knee pain   History of Present Illness: REISA COPPOLA is a 82 y.o. female with history of fibromyalgia, osteoarthritis, and DDD.  She takes gabapentin 100 mg 3 times daily for management of fibromyalgia.  Patient had a right knee cortisone injection performed on 07/29/2017.  She returned on 08/22/2017 for gel 1 injections of bilateral knees.  She reports that the injections have helped with her knee pain but she continues to have significant swelling in her right knee.  She states that the swelling has moved distally as well and since having swelling in her ankle joint.  She states that when it is very swollen she has experienced some aching.  She states that she is also become more stiff especially after sitting for prolonged peers of time.  She states that she has limited range of motion due to the swelling in her right knee as well.  She states that she has been using Pennsaid topically as well as icing 2-3 times per day.  She states that she continues to have chronic pain in multiple joints including her bilateral shoulders bilateral hands bilateral knees and bilateral feet.  She states that she is also having chronic pain in her neck and lower back.  She reports that she is not ready to have surgery on her neck at this time.  She continues to have generalized muscle aches muscle tenderness due to fibromyalgia.    Activities of Daily Living:  Patient reports morning stiffness for 3 minute.   Patient Denies nocturnal pain.  Difficulty dressing/grooming: Denies Difficulty climbing stairs: Reports Difficulty getting out of chair: Denies Difficulty using hands for taps, buttons, cutlery, and/or writing: Reports   Review of Systems  Constitutional:  Positive for fatigue.  HENT: Negative for mouth dryness.   Eyes: Negative for dryness.  Respiratory: Negative for cough and shortness of breath.   Cardiovascular: Positive for swelling in legs/feet. Negative for chest pain, palpitations and hypertension.  Gastrointestinal: Negative for blood in stool, constipation and diarrhea.  Endocrine: Negative for excessive thirst.  Genitourinary: Negative for difficulty urinating.  Musculoskeletal: Positive for arthralgias, joint pain, joint swelling, muscle weakness, morning stiffness and muscle tenderness. Negative for myalgias and myalgias.  Skin: Negative for rash, hair loss and sensitivity to sunlight.  Neurological: Negative for dizziness, numbness, headaches and weakness.  Psychiatric/Behavioral: Negative for depressed mood and sleep disturbance.    PMFS History:  Patient Active Problem List   Diagnosis Date Noted  . Decreased renal function 01/29/2017  . Anemia 01/29/2017  . Preventative health care 01/29/2017  . Other fatigue 09/03/2016  . Primary insomnia 09/03/2016  . Primary osteoarthritis of both knees 09/03/2016  . DJD (degenerative joint disease), cervical 09/03/2016  . Osteopenia 03/04/2016  . Palpitations 09/19/2014  . Thyroid nodule, uninodular 09/18/2011  . Hypothyroid 08/19/2011  . Postmenopausal HRT (hormone replacement therapy) 08/13/2011  . Mitral regurgitation   . Lumbar spine pain   . Dyslipidemia   . Chest pain   . Statin intolerance   . Mitral valve prolapse   . Ejection fraction   . Fibromyalgia   .  ADENOMATOUS COLONIC POLYP 08/15/2007  . GERD 08/15/2007  . BARRETTS ESOPHAGUS 08/15/2007  . HIATAL HERNIA 08/15/2007  . Osteoarthritis 08/15/2007  . CATARACT EXTRACTION, HX OF 08/15/2007    Past Medical History:  Diagnosis Date  . Anxiety    prev on prozac  . Chest pain    Felt to be noncardiac in the past  . Colon polyp   . Dyslipidemia   . Ejection fraction    EF 60%, echo, 2007, atrial septum bows  left to right compatible with increased left atrial pressure  . Fibromyalgia   . GERD (gastroesophageal reflux disease)    Barrett's esophagus  . Hiatal hernia   . Lumbar spine pain    Complex lumbar spine surgery at [facility], 5427, complicated, MRSA, much of the appliance is removed, patient on chronic antibiotics  . Melanoma ([place])   . Mitral regurgitation    Mild, 2007,  /  moderate or severe echo, September, 2012, eccentric jet wrapping the left atrium, consider TEE for further assessment  . Mitral valve prolapse    Mild mitral regurgitation echo, 2007  . Osteoarthritis   . Scoliosis   . Sinusitis    Multiple sinus operations in the past  . Statin intolerance    As of 2009, no further attempts to use statins  . Thyroid nodule     Family History  Problem Relation Age of Onset  . GER disease Mother   . Dementia Mother   . Breast cancer Mother   . Heart disease Father        MI at 14  . Scoliosis Daughter   . Colon cancer Neg Hx    Past Surgical History:  Procedure Laterality Date  . ABDOMINAL HYSTERECTOMY    . BACK SURGERY    . CHOLECYSTECTOMY    . FOOT SURGERY    . HERNIA REPAIR    . NASAL SINUS SURGERY     Social History   Social History Narrative   From [place]   Married (second [phone]   3 kids, 2 of whom are local   Retired, Camera operator     Objective: Vital Signs: BP (!) 127/56 (BP Location: Left Arm, Patient Position: Sitting, Cuff Size: Normal)   Pulse 75   Resp 16   Ht 4\' 9"  (1.448 m)   Wt 145 lb (65.8 kg)   BMI 31.38 kg/m    Physical Exam  Constitutional: She is oriented to person, place, and time. She appears well-developed and well-nourished.  HENT:  Head: Normocephalic and atraumatic.  Eyes: Conjunctivae and EOM are normal.  Neck: Normal range of motion.  Cardiovascular: Normal rate, regular rhythm, normal heart sounds and intact distal pulses.  Pulmonary/Chest: Effort normal and breath sounds normal.  Abdominal: Soft. Bowel  sounds are normal.  Lymphadenopathy:    She has no cervical adenopathy.  Neurological: She is alert and oriented to person, place, and time.  Skin: Skin is warm and dry. Capillary refill takes less than 2 seconds.  Psychiatric: She has a normal mood and affect. Her behavior is normal.  Nursing note and vitals reviewed.    Musculoskeletal Exam: C-spine limited ROM. Thoracic kyphosis.  Lumbar spine limited ROM.  No midline spinal tenderness.  Shoulder joints limited abduction.  Elbow joints good ROM.  Limited ROM of bilateral wrist joints.  MCPs, PIPs, and DIPs good ROM.  PIP and DIP synovial thickening and tenderness of PIP joints consistent with inflammatory arthritis.  Hip joints good ROM.  Right  knee warmth and limited ROM.  Left knee good ROM no warmth or effusion.  Pedal edema bilaterally.   CDAI Exam: No CDAI exam completed.    Investigation: No additional findings. CBC Latest Ref Rng & Units 04/30/2017  WBC 4.0 - 10.5 K/uL 4.7  Hemoglobin 12.0 - 15.0 g/dL 11.7(L)  Hematocrit 36.0 - 46.0 % 36.1  Platelets 150.0 - 400.0 K/uL 238.0   CMP Latest Ref Rng & Units 04/30/2017 01/27/2017 12/17/2007  Glucose 70 - 99 mg/dL 74 96 -  BUN 6 - 23 mg/dL 27(H) 19 -  Creatinine 0.40 - 1.20 mg/dL 1.10 1.09 -  Sodium 135 - 145 mEq/L 140 139 -  Potassium 3.5 - 5.1 mEq/L 4.0 4.3 -  Chloride 96 - 112 mEq/L 101 101 -  CO2 19 - 32 mEq/L 32 30 -  Calcium 8.4 - 10.5 mg/dL 9.4 9.6 -  Total Protein 6.0 - 8.3 g/dL - 6.8 6.6  Total Bilirubin 0.2 - 1.2 mg/dL - 0.4 0.8  Alkaline Phos 39 - 117 U/L - 35(L) 55  AST 0 - 37 U/L - 20 18  ALT 0 - 35 U/L - 17 16     Imaging: No results found.  Speciality Comments: No specialty comments available.    Procedures:  Large Joint Inj on 09/19/2017 11:42 AM Indications: pain Details: 27 G 1.5 in needle, medial approach  Arthrogram: No  Medications: 1.5 mL lidocaine 1 %; 60 mg triamcinolone acetonide 40 MG/ML Aspirate: 0 mL Outcome: tolerated well, no  immediate complications Procedure, treatment alternatives, risks and benefits explained, specific risks discussed. Consent was given by the patient. Immediately prior to procedure a time out was called to verify the correct patient, procedure, equipment, support staff and site/side marked as required. Patient was prepped and draped in the usual sterile fashion.     Allergies: Other; Fenofibric acid; Nsaids; Statins; and Trilipix [choline fenofibrate]   Assessment / Plan:     Visit Diagnoses: Fibromyalgia -she continues to have generalized muscle aches muscle tenderness due to fibromyalgia.  She has chronic fatigue and insomnia.  She takes gabapentin 100 mg TID.  She has not had any recent full-blown fibromyalgia flares.  Primary osteoarthritis of both hands: She has PIP and DIP synovial thickening consistent with osteoarthritis of bilateral hands.  She has tenderness of the PIP joints on exam today.  CMC joint synovial thickening as well.  Joint protection and muscle strengthening were discussed.  She uses Pennsaid topically for pain relief.  Primary osteoarthritis of both knees: She has bilateral knee discomfort.  She has swelling and warmth of her right knee on exam today.  She had a cortisone injection performed on 07/29/2017 and then had gel 1 injections of bilateral knees on 08/22/2017.  She reports that these knee injections helped with the pain but did not help with the right knee swelling.  She requested a cortisone injection today in her right knee.  She tolerated the procedure well.  She was advised to continue to rest, ice, and elevate her right knee.  She can also continue to use Pennsaid topically.  Chronic pain of right knee: Her right knee pain improved after the cortisone injection on 07/29/2017.  She continues to have swelling and warmth of her right knee on exam.  She requested a cortisone injection today.  She tolerated the procedure well.  Primary insomnia: She has been sleeping  better at night.   Other fatigue: Chronic.   DDD (degenerative disc disease), cervical: Chronic pain she  is limited range of motion on exam.  DDD (degenerative disc disease), lumbar - S/p fusion T11-S1: Chronic pain and limited range of motion.  Osteopenia of multiple sites - T score -2.3 09/2015. I placed an order for a DEXA scan at [facility].- Plan: DG BONE DENSITY (DXA)  Other medical conditions are listed as follows:   History of mitral valve prolapse  History of hyperlipidemia  History of hypothyroidism  History of Barrett's esophagus  History of gastroesophageal reflux (GERD)  History of hiatal hernia  History of anemia    Orders: Orders Placed This Encounter  Procedures  . Large Joint Inj  . DG BONE DENSITY (DXA)   No orders of the defined types were placed in this encounter.   Face-to-face time spent with patient was 30 minutes. >50% of time was spent in counseling and coordination of care.  Follow-Up Instructions: No follow-ups on file.   Ofilia Neas, PA-C  Note - This record has been created using Dragon software.  Chart creation errors have been sought, but may not always  have been located. Such creation errors do not reflect on  the standard of medical care.

## 2017-09-10 ENCOUNTER — Other Ambulatory Visit: Payer: Self-pay | Admitting: Primary Care

## 2017-09-10 DIAGNOSIS — E039 Hypothyroidism, unspecified: Secondary | ICD-10-CM

## 2017-09-19 ENCOUNTER — Ambulatory Visit (INDEPENDENT_AMBULATORY_CARE_PROVIDER_SITE_OTHER): Payer: Medicare Other | Admitting: Physician Assistant

## 2017-09-19 ENCOUNTER — Encounter: Payer: Self-pay | Admitting: Physician Assistant

## 2017-09-19 VITALS — BP 127/56 | HR 75 | Resp 16 | Ht <= 58 in | Wt 145.0 lb

## 2017-09-19 DIAGNOSIS — M797 Fibromyalgia: Secondary | ICD-10-CM | POA: Diagnosis not present

## 2017-09-19 DIAGNOSIS — Z8719 Personal history of other diseases of the digestive system: Secondary | ICD-10-CM

## 2017-09-19 DIAGNOSIS — Z8679 Personal history of other diseases of the circulatory system: Secondary | ICD-10-CM

## 2017-09-19 DIAGNOSIS — F5101 Primary insomnia: Secondary | ICD-10-CM | POA: Diagnosis not present

## 2017-09-19 DIAGNOSIS — Z8639 Personal history of other endocrine, nutritional and metabolic disease: Secondary | ICD-10-CM

## 2017-09-19 DIAGNOSIS — M5030 Other cervical disc degeneration, unspecified cervical region: Secondary | ICD-10-CM

## 2017-09-19 DIAGNOSIS — M5136 Other intervertebral disc degeneration, lumbar region: Secondary | ICD-10-CM

## 2017-09-19 DIAGNOSIS — M170 Bilateral primary osteoarthritis of knee: Secondary | ICD-10-CM

## 2017-09-19 DIAGNOSIS — M19041 Primary osteoarthritis, right hand: Secondary | ICD-10-CM

## 2017-09-19 DIAGNOSIS — M51369 Other intervertebral disc degeneration, lumbar region without mention of lumbar back pain or lower extremity pain: Secondary | ICD-10-CM

## 2017-09-19 DIAGNOSIS — Z862 Personal history of diseases of the blood and blood-forming organs and certain disorders involving the immune mechanism: Secondary | ICD-10-CM

## 2017-09-19 DIAGNOSIS — M25561 Pain in right knee: Secondary | ICD-10-CM | POA: Diagnosis not present

## 2017-09-19 DIAGNOSIS — G8929 Other chronic pain: Secondary | ICD-10-CM | POA: Diagnosis not present

## 2017-09-19 DIAGNOSIS — M19042 Primary osteoarthritis, left hand: Secondary | ICD-10-CM

## 2017-09-19 DIAGNOSIS — M8589 Other specified disorders of bone density and structure, multiple sites: Secondary | ICD-10-CM

## 2017-09-19 DIAGNOSIS — R5383 Other fatigue: Secondary | ICD-10-CM

## 2017-09-19 MED ORDER — LIDOCAINE HCL 1 % IJ SOLN
1.5000 mL | INTRAMUSCULAR | Status: AC | PRN
  Administered 2017-09-19: 1.5 mL

## 2017-09-19 MED ORDER — TRIAMCINOLONE ACETONIDE 40 MG/ML IJ SUSP
60.0000 mg | INTRAMUSCULAR | Status: AC | PRN
  Administered 2017-09-19: 60 mg via INTRA_ARTICULAR

## 2017-09-19 NOTE — Patient Instructions (Signed)

## 2017-10-03 ENCOUNTER — Encounter: Payer: Self-pay | Admitting: Rheumatology

## 2017-10-03 DIAGNOSIS — M8589 Other specified disorders of bone density and structure, multiple sites: Secondary | ICD-10-CM | POA: Diagnosis not present

## 2017-10-17 ENCOUNTER — Telehealth: Payer: Self-pay | Admitting: *Deleted

## 2017-10-17 NOTE — Telephone Encounter (Signed)
Bone Density scan results show Osteopenia T-Score -2.2 Improved Repeat in 2 years  Calcium and Vitamin d Resistive Exercises

## 2017-10-22 ENCOUNTER — Ambulatory Visit: Payer: Self-pay

## 2017-10-22 DIAGNOSIS — T63461A Toxic effect of venom of wasps, accidental (unintentional), initial encounter: Secondary | ICD-10-CM | POA: Diagnosis not present

## 2017-10-22 DIAGNOSIS — L03114 Cellulitis of left upper limb: Secondary | ICD-10-CM | POA: Diagnosis not present

## 2017-10-22 DIAGNOSIS — Z23 Encounter for immunization: Secondary | ICD-10-CM | POA: Diagnosis not present

## 2017-10-22 NOTE — Telephone Encounter (Signed)
Pt. Reported receiving wasp sting on left thumb yesterday about 5:00 PM.  Reported she has had severe swelling of the left hand, and some swelling and redness into the left forearm.  C/o severe itching of the left arm.  Stated had some dizziness about 10 min. after the sting occurred.  Denied any swelling of face, tongue, or shortness of breath.  Has been taking Benadryl; last dose taken about 4:00 PM.  Per protocol, due to 24 hrs. post wasp sting, advised to go to [facility] for evaluation now. Agrees with plan.     Reason for Disposition . [1] Red or very tender (to touch) area AND [2] started over 24 hours after the sting  Answer Assessment - Initial Assessment Questions 1. TYPE: "What type of sting was it?" (bee, yellow jacket, etc.)      Wasp 2. ONSET: "When did it occur?"      Yesterday @ 5:00 PM 3. LOCATION: "Where is the sting located?"  "How many stings?"     Left thumb; thinks only one sting 4. SWELLING SIZE: "How big is the swelling?" (inches or centimeters)     Severe hand swelling; about 3x size of right hand 5. REDNESS: "Is the area red or pink?" If so, ask "What size is area of redness?" (inches or cm). "When did the redness start?"    Redness up the forearm  6. PAIN: "Is there any pain?" If so, ask: "How bad is it?"  (Scale 1-10; or mild, moderate, severe)     Uncomfortable  7. ITCHING: "Is there any itching?" If so, ask: "How bad is it?"      severe 8. RESPIRATORY DISTRESS: "Describe your breathing."    Denied any resp. Distress  9. PRIOR REACTIONS: "Have you had any severe allergic reactions to stings in the past?" if yes, ask: "What happened?"     Yes 10. OTHER SYMPTOMS: "Do you have any other symptoms?" (e.g., face or tongue swelling, new rash elsewhere, abdominal pain, vomiting)       Dizziness x 10 min. ; denies shortness of breath, tongue or facial swelling.  Protocols used: BEE OR YELLOW JACKET STING-A-AH

## 2017-10-24 NOTE — Telephone Encounter (Signed)
Sent to incorrect provider.  Pt not listed as seeing PCP provider

## 2017-10-24 NOTE — Telephone Encounter (Signed)
Noted, this seems appropriate.  Will you check on patient Monday next week?

## 2017-10-24 NOTE — Telephone Encounter (Signed)
Sent to wrong PCP; redirecting note to K. Carlis Abbott, NP.

## 2017-10-27 NOTE — Telephone Encounter (Signed)
Spoken with patient. She stated that she is much better today. She did went to urgent care. Patient was given prednisone, anabiotics, and Td shot.

## 2017-10-27 NOTE — Telephone Encounter (Signed)
Noted  

## 2017-10-28 ENCOUNTER — Other Ambulatory Visit: Payer: Self-pay | Admitting: Primary Care

## 2017-10-28 DIAGNOSIS — Z79890 Hormone replacement therapy (postmenopausal): Secondary | ICD-10-CM

## 2017-10-28 NOTE — Telephone Encounter (Signed)
Electronically refill request for estrogens, conjugated, (PREMARIN) 0.3 MG tablet  Last prescribed on 04/29/2017  Last office visit on 01/29/2017. Next AMW on 01/29/2018

## 2017-10-28 NOTE — Telephone Encounter (Signed)
Refill sent to pharmacy. Will discuss potentially weaning down during her visit this Fall.

## 2017-11-06 ENCOUNTER — Ambulatory Visit: Payer: Self-pay

## 2017-11-06 NOTE — Telephone Encounter (Signed)
Pt. Reports she noticed swelling to both legs this week. The right "leg is worse - goes up to almost the knee." No redness noted. Recently finished medication for a bee sting. Offered appointment for today with a different provider. Wants to see Ms. Clark only. Appointment made for Monday as requested. Instructed if swelling worsens or she develops shortness of breath, chest pain to go to ED - verbalizes understanding. Reason for Disposition . [1] MODERATE leg swelling (e.g., swelling extends up to knees) AND [2] new onset or worsening  Answer Assessment - Initial Assessment Questions 1. ONSET: "When did the swelling start?" (e.g., minutes, hours, days)     Started this week 2. LOCATION: "What part of the leg is swollen?"  "Are both legs swollen or just one leg?"     Both legs - right goes up to the knee 3. SEVERITY: "How bad is the swelling?" (e.g., localized; mild, moderate, severe)  - Localized - small area of swelling localized to one leg  - MILD pedal edema - swelling limited to foot and ankle, pitting edema < 1/4 inch (6 mm) deep, rest and elevation eliminate most or all swelling  - MODERATE edema - swelling of lower leg to knee, pitting edema > 1/4 inch (6 mm) deep, rest and elevation only partially reduce swelling  - SEVERE edema - swelling extends above knee, facial or hand swelling present      Moderate 4. REDNESS: "Does the swelling look red or infected?"     No 5. PAIN: "Is the swelling painful to touch?" If so, ask: "How painful is it?"   (Scale 1-10; mild, moderate or severe)     No pain 6. FEVER: "Do you have a fever?" If so, ask: "What is it, how was it measured, and when did it start?"      No 7. CAUSE: "What do you think is causing the leg swelling?"     Unsure 8. MEDICAL HISTORY: "Do you have a history of heart failure, kidney disease, liver failure, or cancer?"     No 9. RECURRENT SYMPTOM: "Have you had leg swelling before?" If so, ask: "When was the last time?" "What  happened that time?"     No 10. OTHER SYMPTOMS: "Do you have any other symptoms?" (e.g., chest pain, difficulty breathing)       No 11. PREGNANCY: "Is there any chance you are pregnant?" "When was your last menstrual period?"       No  Protocols used: LEG SWELLING AND EDEMA-A-AH

## 2017-11-06 NOTE — Telephone Encounter (Signed)
Pt has appt with Gentry Fitz NP on 11/10/17 at 10:40.

## 2017-11-06 NOTE — Telephone Encounter (Signed)
Noted  

## 2017-11-07 ENCOUNTER — Other Ambulatory Visit: Payer: Self-pay | Admitting: Rheumatology

## 2017-11-07 NOTE — Telephone Encounter (Signed)
Last Visit: 09/19/17 Next visit: 03/24/18  Okay to refill per Dr. Estanislado Pandy

## 2017-11-10 ENCOUNTER — Ambulatory Visit: Payer: Medicare Other | Admitting: Primary Care

## 2017-11-18 ENCOUNTER — Encounter: Payer: Self-pay | Admitting: Rheumatology

## 2017-11-18 NOTE — Telephone Encounter (Signed)
Called patient and advised her of bone density results that were documented in the chart. Patient verbalized understanding.

## 2017-11-24 NOTE — Progress Notes (Signed)
Office Visit Note  Patient: [name] [name]             Date of Birth: 03-Oct-1935           MRN: [id number]             PCP: [name], NP Referring: [name], NP Visit Date: 11/25/2017 Occupation: @GUAROCC @  Subjective:  Pain and swelling in knees.   History of Present Illness: [name] [name] is a 82 y.o. female history of fibromyalgia, osteoarthritis and degenerative disc disease.  She states her right knee joint has been painful and swollen.  She had cortisone injection to her right knee joint in about 2 months ago and now the pain has recurred.  She continues to has discomfort in her left knee joint as well.  She has been experiencing pain and discomfort in her left shoulder.  She states that she has had problems with her left shoulder for last 7 to 8 years but in the last 3 weeks is been worse and the pain is been radiating down her left arm.  She continues to have neck and lower back pain.  She continues to have some discomfort in her bilateral hands.  She also has fibromyalgia is not causing much discomfort currently.  Activities of Daily Living:  Patient reports morning stiffness for 10 minutes.   Patient Reports nocturnal pain.  Difficulty dressing/grooming: Denies Difficulty climbing stairs: Reports Difficulty getting out of chair: Denies Difficulty using hands for taps, buttons, cutlery, and/or writing: Reports  Review of Systems  Constitutional: Positive for fatigue. Negative for night sweats, weight gain and weight loss.  HENT: Negative for mouth sores, trouble swallowing, trouble swallowing, mouth dryness and nose dryness.   Eyes: Negative for pain, redness, visual disturbance and dryness.  Respiratory: Negative for cough, shortness of breath and difficulty breathing.   Cardiovascular: Positive for swelling in legs/feet. Negative for chest pain, palpitations, hypertension and irregular heartbeat.  Gastrointestinal: Negative for blood in stool, constipation  and diarrhea.  Endocrine: Negative for increased urination.  Genitourinary: Negative for difficulty urinating and vaginal dryness.  Musculoskeletal: Positive for arthralgias, gait problem, joint pain, joint swelling and muscle tenderness. Negative for myalgias, muscle weakness, morning stiffness and myalgias.  Skin: Negative for color change, rash, hair loss, skin tightness, ulcers and sensitivity to sunlight.  Allergic/Immunologic: Negative for susceptible to infections.  Neurological: Negative for dizziness, numbness, memory loss, night sweats and weakness.  Hematological: Negative for bruising/bleeding tendency and swollen glands.  Psychiatric/Behavioral: Negative for depressed mood and sleep disturbance. The patient is not nervous/anxious.     PMFS History:  Patient Active Problem List   Diagnosis Date Noted  . Decreased renal function 01/29/2017  . Anemia 01/29/2017  . Preventative health care 01/29/2017  . Other fatigue 09/03/2016  . Primary insomnia 09/03/2016  . Primary osteoarthritis of both knees 09/03/2016  . DJD (degenerative joint disease), cervical 09/03/2016  . Osteopenia 03/04/2016  . Palpitations 09/19/2014  . Thyroid nodule, uninodular 09/18/2011  . Hypothyroid 08/19/2011  . Postmenopausal HRT (hormone replacement therapy) 08/13/2011  . Mitral regurgitation   . Lumbar spine pain   . Dyslipidemia   . Chest pain   . Statin intolerance   . Mitral valve prolapse   . Ejection fraction   . Fibromyalgia   . ADENOMATOUS COLONIC POLYP 08/15/2007  . GERD 08/15/2007  . BARRETTS ESOPHAGUS 08/15/2007  . HIATAL HERNIA 08/15/2007  . Osteoarthritis 08/15/2007  . CATARACT EXTRACTION, HX OF 08/15/2007  Past Medical History:  Diagnosis Date  . Anxiety    prev on prozac  . Chest pain    Felt to be noncardiac in the past  . Colon polyp   . Dyslipidemia   . Ejection fraction    EF 60%, echo, 2007, atrial septum bows left to right compatible with increased left atrial  pressure  . Fibromyalgia   . GERD (gastroesophageal reflux disease)    Barrett's esophagus  . Hiatal hernia   . Lumbar spine pain    Complex lumbar spine surgery at [facility], 8101, complicated, MRSA, much of the appliance is removed, patient on chronic antibiotics  . Melanoma ([place])   . Mitral regurgitation    Mild, 2007,  /  moderate or severe echo, September, 2012, eccentric jet wrapping the left atrium, consider TEE for further assessment  . Mitral valve prolapse    Mild mitral regurgitation echo, 2007  . Osteoarthritis   . Scoliosis   . Sinusitis    Multiple sinus operations in the past  . Statin intolerance    As of 2009, no further attempts to use statins  . Thyroid nodule     Family History  Problem Relation Age of Onset  . GER disease Mother   . Dementia Mother   . Breast cancer Mother   . Heart disease Father        MI at 24  . Scoliosis Daughter   . Colon cancer Neg Hx    Past Surgical History:  Procedure Laterality Date  . ABDOMINAL HYSTERECTOMY    . BACK SURGERY    . CHOLECYSTECTOMY    . FOOT SURGERY    . HERNIA REPAIR    . NASAL SINUS SURGERY     Social History   Social History Narrative   From [place]   Married (second [phone]   3 kids, 2 of whom are local   Retired, Camera operator    Objective: Vital Signs: BP 119/76 (BP Location: Left Arm, Patient Position: Sitting, Cuff Size: Normal)   Pulse 68   Resp 16   Ht 4\' 9"  (1.448 m)   Wt 149 lb 3.2 oz (67.7 kg)   BMI 32.29 kg/m    Physical Exam  Constitutional: She is oriented to person, place, and time. She appears well-developed and well-nourished.  HENT:  Head: Normocephalic and atraumatic.  Eyes: Conjunctivae and EOM are normal.  Neck: Normal range of motion.  Cardiovascular: Normal rate, regular rhythm, normal heart sounds and intact distal pulses.  Pulmonary/Chest: Effort normal and breath sounds normal.  Abdominal: Soft. Bowel sounds are normal.  Lymphadenopathy:    She has no  cervical adenopathy.  Neurological: She is alert and oriented to person, place, and time.  Skin: Skin is warm and dry. Capillary refill takes less than 2 seconds.  Psychiatric: She has a normal mood and affect. Her behavior is normal.  Nursing note and vitals reviewed.    Musculoskeletal Exam: She has limited range of motion of her cervical thoracic and lumbar spine with lumbar scoliosis.  She has painful range of motion of her left shoulder.  Right shoulder joint and bilateral elbow joints were in good range of motion.  She has severe DIP and PIP thickening with subluxation of several of her PIPs and inflammatory changes.  Hip joints were in good range of motion.  She had warmth and swelling in her right knee joint without effusion.  Left knee joint was in good range of motion with crepitus.  She has generalized pain and positive tender points.  CDAI Exam: CDAI Score: Not documented Patient Global Assessment: Not documented; Provider Global Assessment: Not documented Swollen: Not documented; Tender: Not documented Joint Exam   Not documented   There is currently no information documented on the homunculus. Go to the Rheumatology activity and complete the homunculus joint exam.  Investigation: No additional findings.  Imaging: Xr Knee 3 View Right  Result Date: 11/25/2017 Moderate medial compartment narrowing without chondrocalcinosis was noted.  Moderate patellofemoral narrowing was noted. Impression: Moderate osteoarthritis and moderate chondromalacia patella.  Xr Shoulder Left  Result Date: 11/25/2017 Mild glenohumeral joint space narrowing was noted.  No acromioclavicular joint space narrowing was noted.  No chondrocalcinosis was noted.   Recent Labs: Lab Results  Component Value Date   WBC 4.7 04/30/2017   HGB 11.7 (L) 04/30/2017   PLT 238.0 04/30/2017   NA 140 04/30/2017   K 4.0 04/30/2017   CL 101 04/30/2017   CO2 32 04/30/2017   GLUCOSE 74 04/30/2017   BUN 27 (H)  04/30/2017   CREATININE 1.10 04/30/2017   BILITOT 0.4 01/27/2017   ALKPHOS 35 (L) 01/27/2017   AST 20 01/27/2017   ALT 17 01/27/2017   PROT 6.8 01/27/2017   ALBUMIN 4.6 01/27/2017   CALCIUM 9.4 04/30/2017    Speciality Comments: No specialty comments available.  Procedures:  No procedures performed Allergies: Other; Fenofibric acid; Nsaids; Statins; Trilipix [choline fenofibrate]; and Wasp venom   Assessment / Plan:     Visit Diagnoses: Chronic left shoulder pain -patient gives history of chronic pain in her left shoulder for many years although the pain has increased in the last 3 weeks.  She has discomfort with range of motion.  Plan: XR Shoulder Left.  Mild glenohumeral joint space narrowing was noted.  Primary osteoarthritis of both hands-she has severe osteoarthritis in her hands with inflammatory component.  Although autoimmune work-up in the past has been negative.  She requests getting repeat labs which I will obtain today.  Chronic pain of right knee -she has moderate to severe osteoarthritis in her bilateral knee joints.  She has warmth and swelling in her right knee joint.  She had cortisone injection in June and had good response.  The pain has recurred now.  She is requesting a prednisone taper.  Side effects were discussed.  I will give her a short course of prednisone taper.  Plan: XR KNEE 3 VIEW RIGHT.  The x-ray was consistent with moderate osteoarthritis and moderate chondromalacia patella.  Primary osteoarthritis of both knees  DDD (degenerative disc disease), cervical-chronic pain  DDD (degenerative disc disease), lumbar - S/p fusion T11-S1.  Chronic pain  Fibromyalgia-she continues to have some discomfort from fibromyalgia.  Other fatigue-secondary to insomnia.  Primary insomnia  Osteopenia of multiple sites - T score -2.3 09/2015. T-score: -2.2 09/2017  History of anemia  History of mitral valve prolapse  History of hiatal hernia  History of  hypothyroidism  History of hyperlipidemia  History of gastroesophageal reflux (GERD)  History of Barrett's esophagus   Orders: Orders Placed This Encounter  Procedures  . XR KNEE 3 VIEW RIGHT  . XR Shoulder Left  . Rheumatoid factor  . Cyclic citrul peptide antibody, IgG  . 14-3-3 eta Protein  . Uric acid   Meds ordered this encounter  Medications  . predniSONE (DELTASONE) 5 MG tablet    Sig: Take 4 tablets x2 days, 3 tablets x2 days, 2 tablets x2 days, 1 tablet x2 days.  Dispense:  20 tablet    Refill:  0    Face-to-face time spent with patient was 30 minutes. Greater than 50% of time was spent in counseling and coordination of care.  Follow-Up Instructions: Return in about 6 months (around 05/28/2018) for FMS, OA, DDD.   Bo Merino, MD  Note - This record has been created using Editor, commissioning.  Chart creation errors have been sought, but may not always  have been located. Such creation errors do not reflect on  the standard of medical care.

## 2017-11-25 ENCOUNTER — Ambulatory Visit (INDEPENDENT_AMBULATORY_CARE_PROVIDER_SITE_OTHER): Payer: Medicare Other | Admitting: Rheumatology

## 2017-11-25 ENCOUNTER — Ambulatory Visit (INDEPENDENT_AMBULATORY_CARE_PROVIDER_SITE_OTHER): Payer: Self-pay

## 2017-11-25 ENCOUNTER — Encounter: Payer: Self-pay | Admitting: Rheumatology

## 2017-11-25 VITALS — BP 119/76 | HR 68 | Resp 16 | Ht <= 58 in | Wt 149.2 lb

## 2017-11-25 DIAGNOSIS — G8929 Other chronic pain: Secondary | ICD-10-CM

## 2017-11-25 DIAGNOSIS — M5030 Other cervical disc degeneration, unspecified cervical region: Secondary | ICD-10-CM

## 2017-11-25 DIAGNOSIS — R5383 Other fatigue: Secondary | ICD-10-CM

## 2017-11-25 DIAGNOSIS — Z8639 Personal history of other endocrine, nutritional and metabolic disease: Secondary | ICD-10-CM

## 2017-11-25 DIAGNOSIS — M19041 Primary osteoarthritis, right hand: Secondary | ICD-10-CM | POA: Diagnosis not present

## 2017-11-25 DIAGNOSIS — M25561 Pain in right knee: Secondary | ICD-10-CM | POA: Diagnosis not present

## 2017-11-25 DIAGNOSIS — M8589 Other specified disorders of bone density and structure, multiple sites: Secondary | ICD-10-CM

## 2017-11-25 DIAGNOSIS — M19042 Primary osteoarthritis, left hand: Secondary | ICD-10-CM

## 2017-11-25 DIAGNOSIS — Z862 Personal history of diseases of the blood and blood-forming organs and certain disorders involving the immune mechanism: Secondary | ICD-10-CM

## 2017-11-25 DIAGNOSIS — M797 Fibromyalgia: Secondary | ICD-10-CM

## 2017-11-25 DIAGNOSIS — Z8679 Personal history of other diseases of the circulatory system: Secondary | ICD-10-CM

## 2017-11-25 DIAGNOSIS — M170 Bilateral primary osteoarthritis of knee: Secondary | ICD-10-CM

## 2017-11-25 DIAGNOSIS — M25512 Pain in left shoulder: Secondary | ICD-10-CM | POA: Diagnosis not present

## 2017-11-25 DIAGNOSIS — F5101 Primary insomnia: Secondary | ICD-10-CM

## 2017-11-25 DIAGNOSIS — M5136 Other intervertebral disc degeneration, lumbar region: Secondary | ICD-10-CM

## 2017-11-25 DIAGNOSIS — Z8719 Personal history of other diseases of the digestive system: Secondary | ICD-10-CM

## 2017-11-25 MED ORDER — PREDNISONE 5 MG PO TABS: ORAL_TABLET | ORAL | 0 refills | Status: DC

## 2017-11-29 LAB — URIC ACID: Uric Acid, Serum: 3.7 mg/dL (ref 2.5–7.0)

## 2017-11-29 LAB — RHEUMATOID FACTOR

## 2017-11-29 LAB — CYCLIC CITRUL PEPTIDE ANTIBODY, IGG: Cyclic Citrullin Peptide Ab: <16 UNITS

## 2017-11-29 LAB — 14-3-3 ETA PROTEIN: 14-3-3 eta Protein: <0.2 ng/mL (ref <0.2)

## 2017-12-04 ENCOUNTER — Other Ambulatory Visit: Payer: Self-pay | Admitting: Primary Care

## 2017-12-04 DIAGNOSIS — E785 Hyperlipidemia, unspecified: Secondary | ICD-10-CM

## 2017-12-04 DIAGNOSIS — H26492 Other secondary cataract, left eye: Secondary | ICD-10-CM | POA: Diagnosis not present

## 2018-01-08 ENCOUNTER — Other Ambulatory Visit: Payer: Self-pay | Admitting: Physician Assistant

## 2018-01-08 NOTE — Telephone Encounter (Signed)
Last Visit: 11/25/17 Next visit: 03/24/18  Okay to refill per Dr.Deveshwar 

## 2018-01-09 ENCOUNTER — Telehealth: Payer: Self-pay | Admitting: Rheumatology

## 2018-01-09 NOTE — Telephone Encounter (Signed)
Patient advised she does not need a referral to schedule an appointment with Dr. Rush Farmer. patient advised she may just call to schedule the appointment. Patient verbalized understanding.

## 2018-01-09 NOTE — Telephone Encounter (Signed)
Patient called requesting a referral to see Dr. Zollie Beckers for her right knee pain so she can talk to him about surgery options.

## 2018-01-21 ENCOUNTER — Encounter (INDEPENDENT_AMBULATORY_CARE_PROVIDER_SITE_OTHER): Payer: Self-pay | Admitting: Orthopaedic Surgery

## 2018-01-21 ENCOUNTER — Ambulatory Visit (INDEPENDENT_AMBULATORY_CARE_PROVIDER_SITE_OTHER): Payer: Medicare Other | Admitting: Orthopaedic Surgery

## 2018-01-21 DIAGNOSIS — M25512 Pain in left shoulder: Secondary | ICD-10-CM | POA: Diagnosis not present

## 2018-01-21 DIAGNOSIS — M19012 Primary osteoarthritis, left shoulder: Secondary | ICD-10-CM

## 2018-01-21 DIAGNOSIS — M1712 Unilateral primary osteoarthritis, left knee: Secondary | ICD-10-CM

## 2018-01-21 DIAGNOSIS — M25562 Pain in left knee: Secondary | ICD-10-CM | POA: Diagnosis not present

## 2018-01-21 DIAGNOSIS — M1711 Unilateral primary osteoarthritis, right knee: Secondary | ICD-10-CM | POA: Diagnosis not present

## 2018-01-21 DIAGNOSIS — G8929 Other chronic pain: Secondary | ICD-10-CM

## 2018-01-21 DIAGNOSIS — M25561 Pain in right knee: Secondary | ICD-10-CM | POA: Diagnosis not present

## 2018-01-21 MED ORDER — METHYLPREDNISOLONE ACETATE 40 MG/ML IJ SUSP
40.0000 mg | INTRAMUSCULAR | Status: AC | PRN
  Administered 2018-01-21: 40 mg via INTRA_ARTICULAR

## 2018-01-21 MED ORDER — LIDOCAINE HCL 1 % IJ SOLN
3.0000 mL | INTRAMUSCULAR | Status: AC | PRN
Start: 2018-01-21 — End: 2018-01-21
  Administered 2018-01-21: 3 mL

## 2018-01-21 MED ORDER — LIDOCAINE HCL 1 % IJ SOLN
3.0000 mL | INTRAMUSCULAR | Status: AC | PRN
  Administered 2018-01-21: 3 mL

## 2018-01-21 NOTE — Progress Notes (Signed)
Office Visit Note   Patient: [name] [name]           Date of Birth: Apr 13, 1936           MRN: [id number] Visit Date: 01/21/2018              Requested by: [name], NP [place], [place] [address] PCP: [name], NP   Assessment & Plan: Visit Diagnoses:  1. Chronic pain of right knee   2. Chronic pain of left knee   3. Chronic left shoulder pain   4. Unilateral primary osteoarthritis, left knee   5. Unilateral primary osteoarthritis, right knee   6. Osteoarthritis of glenohumeral joint, left     Plan: Refill is appropriate to provide steroid injection in both knees today.  I was first able to aspirate the 40 cc of fluid from the right knee.  She is a perfect candidate for hyaluronic acid with Synvisc 1 of both knees at this point.  She is also benefit from an intra-articular steroid injection under direct fluoroscopy in her left shoulder glenohumeral joint.  We will set this up to Dr. Ernestina Patches section.  From my standpoint I will see her back in 4 weeks to place hyaluronic acid with Synvisc 1 in both knees.  All question concerns were answered and addressed.  She felt much better after the fluid was aspirated from the right knee.  I have recommended a cane that she try in her opposite hand as well she will also increase her Neurontin to 300 mg at bedtime and let us know how she does with that.  Follow-Up Instructions: Return in about 4 weeks (around 02/18/2018).   Orders:  Orders Placed This Encounter  Procedures  . Large Joint Inj  . Large Joint Inj   No orders of the defined types were placed in this encounter.     Procedures: Large Joint Inj: R knee on 01/21/2018 10:53 AM Indications: diagnostic evaluation and pain Details: 22 G 1.5 in needle, superolateral approach  Arthrogram: No  Medications: 3 mL lidocaine 1 %; 40 mg methylPREDNISolone acetate 40 MG/ML Outcome: tolerated well, no immediate complications Procedure, treatment alternatives,  risks and benefits explained, specific risks discussed. Consent was given by the patient. Immediately prior to procedure a time out was called to verify the correct patient, procedure, equipment, support staff and site/side marked as required. Patient was prepped and draped in the usual sterile fashion.   Large Joint Inj: L knee on 01/21/2018 10:53 AM Indications: diagnostic evaluation and pain Details: 22 G 1.5 in needle, superolateral approach  Arthrogram: No  Medications: 3 mL lidocaine 1 %; 40 mg methylPREDNISolone acetate 40 MG/ML Outcome: tolerated well, no immediate complications Procedure, treatment alternatives, risks and benefits explained, specific risks discussed. Consent was given by the patient. Immediately prior to procedure a time out was called to verify the correct patient, procedure, equipment, support staff and site/side marked as required. Patient was prepped and draped in the usual sterile fashion.       Clinical Data: No additional findings.   Subjective: Chief Complaint  Patient presents with  . Left Knee - Pain  . Right Knee - Pain  Patient is a very pleasant 82 year old female who comes in with bilateral knee pain with the right worse than left.  She does have swelling in the right knee.  She has a history of having hyaluronic acid injections in the past for arthritis in her knees.  Is gotten to where those things do not help as much.  She is been getting a lot of swelling and fluid buildup on the right knee.  She also has left shoulder pain.  She has had x-rays on the system of her shoulder and both knees.  These of all been recent.  She does try to keep her shoulder moving going to the gym.  She does not use an assistive device when she walks.  She also has numbness and tingling down her left arm.  She is on Neurontin but just 100 mg only at night.  She is been told she can take it up to 3 times daily.  She prefer to just take this at night.  Her husband is with  her today.  She has extensive history of neck issues as well as significant lumbar spine fusion all the way through the sacrum.  She also has a history of MRSA in the past.  Both knees hurt to the point that is detrimental effect directives daily living and her quality of life as well as her mobility.  HPI  Review of Systems She is not a diabetic.  She currently denies any headache, chest pain, shortness of breath, fever, chills, nausea, vomiting.  Objective: Vital Signs: There were no vitals taken for this visit.  Physical Exam She is alert and oriented x3 and in no acute distress Ortho Exam Examination of both knees show slight warmth with an effusion on the right knee no effusion the left knee.  Both knees have painful range of motion patellofemoral crepitation.  Both knees have slight varus malalignment with medial joint line tenderness.  Examination of her left shoulder shows limited mobility of the shoulder.  There is likely deficits of the rotator cuff and there is significant grinding at the glenohumeral joint. Specialty Comments:  No specialty comments available.  Imaging: No results found. X-rays in the canopy system of both knees show moderate osteoarthritis with still joint space remaining but para-articular osteophytes in all 3 compartments.  X-rays of the left shoulder show severe glenohumeral arthritis.  PMFS History: Patient Active Problem List   Diagnosis Date Noted  . Chronic left shoulder pain 01/21/2018  . Chronic pain of left knee 01/21/2018  . Chronic pain of right knee 01/21/2018  . Osteoarthritis of glenohumeral joint, left 01/21/2018  . Decreased renal function 01/29/2017  . Anemia 01/29/2017  . Preventative health care 01/29/2017  . Other fatigue 09/03/2016  . Primary insomnia 09/03/2016  . Primary osteoarthritis of both knees 09/03/2016  . DJD (degenerative joint disease), cervical 09/03/2016  . Osteopenia 03/04/2016  . Palpitations 09/19/2014  .  Thyroid nodule, uninodular 09/18/2011  . Hypothyroid 08/19/2011  . Postmenopausal HRT (hormone replacement therapy) 08/13/2011  . Mitral regurgitation   . Lumbar spine pain   . Dyslipidemia   . Chest pain   . Statin intolerance   . Mitral valve prolapse   . Ejection fraction   . Fibromyalgia   . ADENOMATOUS COLONIC POLYP 08/15/2007  . GERD 08/15/2007  . BARRETTS ESOPHAGUS 08/15/2007  . HIATAL HERNIA 08/15/2007  . Osteoarthritis 08/15/2007  . CATARACT EXTRACTION, HX OF 08/15/2007   Past Medical History:  Diagnosis Date  . Anxiety    prev on prozac  . Chest pain    Felt to be noncardiac in the past  . Colon polyp   . Dyslipidemia   . Ejection fraction    EF 60%, echo, 2007, atrial septum bows left to right compatible with  increased left atrial pressure  . Fibromyalgia   . GERD (gastroesophageal reflux disease)    Barrett's esophagus  . Hiatal hernia   . Lumbar spine pain    Complex lumbar spine surgery at [facility], 6269, complicated, MRSA, much of the appliance is removed, patient on chronic antibiotics  . Melanoma ([place])   . Mitral regurgitation    Mild, 2007,  /  moderate or severe echo, September, 2012, eccentric jet wrapping the left atrium, consider TEE for further assessment  . Mitral valve prolapse    Mild mitral regurgitation echo, 2007  . Osteoarthritis   . Scoliosis   . Sinusitis    Multiple sinus operations in the past  . Statin intolerance    As of 2009, no further attempts to use statins  . Thyroid nodule     Family History  Problem Relation Age of Onset  . GER disease Mother   . Dementia Mother   . Breast cancer Mother   . Heart disease Father        MI at 45  . Scoliosis Daughter   . Colon cancer Neg Hx     Past Surgical History:  Procedure Laterality Date  . ABDOMINAL HYSTERECTOMY    . BACK SURGERY    . CHOLECYSTECTOMY    . FOOT SURGERY    . HERNIA REPAIR    . NASAL SINUS SURGERY     Social History   Occupational History  . Not on file    Tobacco Use  . Smoking status: Never Smoker  . Smokeless tobacco: Never Used  Substance and Sexual Activity  . Alcohol use: No  . Drug use: No  . Sexual activity: Not on file

## 2018-01-23 ENCOUNTER — Telehealth (INDEPENDENT_AMBULATORY_CARE_PROVIDER_SITE_OTHER): Payer: Self-pay

## 2018-01-23 ENCOUNTER — Other Ambulatory Visit (INDEPENDENT_AMBULATORY_CARE_PROVIDER_SITE_OTHER): Payer: Self-pay

## 2018-01-23 DIAGNOSIS — G8929 Other chronic pain: Secondary | ICD-10-CM

## 2018-01-23 DIAGNOSIS — M25512 Pain in left shoulder: Principal | ICD-10-CM

## 2018-01-23 NOTE — Telephone Encounter (Signed)
Bilateral Synvisc injection

## 2018-01-23 NOTE — Telephone Encounter (Signed)
Noted  

## 2018-01-23 NOTE — Telephone Encounter (Signed)
Submitted VOB for SynviscOne, bilateral knee. 

## 2018-01-27 ENCOUNTER — Other Ambulatory Visit: Payer: Self-pay | Admitting: Primary Care

## 2018-01-27 DIAGNOSIS — E039 Hypothyroidism, unspecified: Secondary | ICD-10-CM

## 2018-01-27 DIAGNOSIS — N289 Disorder of kidney and ureter, unspecified: Secondary | ICD-10-CM

## 2018-01-27 DIAGNOSIS — E785 Hyperlipidemia, unspecified: Secondary | ICD-10-CM

## 2018-01-27 DIAGNOSIS — D649 Anemia, unspecified: Secondary | ICD-10-CM

## 2018-01-29 ENCOUNTER — Other Ambulatory Visit (INDEPENDENT_AMBULATORY_CARE_PROVIDER_SITE_OTHER): Payer: Medicare Other

## 2018-01-29 ENCOUNTER — Ambulatory Visit: Payer: Medicare Other

## 2018-01-29 DIAGNOSIS — E039 Hypothyroidism, unspecified: Secondary | ICD-10-CM | POA: Diagnosis not present

## 2018-01-29 DIAGNOSIS — E785 Hyperlipidemia, unspecified: Secondary | ICD-10-CM

## 2018-01-29 DIAGNOSIS — D649 Anemia, unspecified: Secondary | ICD-10-CM

## 2018-01-29 DIAGNOSIS — N289 Disorder of kidney and ureter, unspecified: Secondary | ICD-10-CM | POA: Diagnosis not present

## 2018-01-29 LAB — COMPREHENSIVE METABOLIC PANEL
ALBUMIN: 4.3 g/dL (ref 3.5–5.2)
ALK PHOS: 56 U/L (ref 39–117)
ALT: 13 U/L (ref 0–35)
AST: 14 U/L (ref 0–37)
BUN: 25 mg/dL — AB (ref 6–23)
CO2: 29 mEq/L (ref 19–32)
CREATININE: 1.15 mg/dL (ref 0.40–1.20)
Calcium: 9.6 mg/dL (ref 8.4–10.5)
Chloride: 101 mEq/L (ref 96–112)
GFR: 48.16 mL/min — ABNORMAL LOW (ref >60.00)
Glucose, Bld: 100 mg/dL — ABNORMAL HIGH (ref 70–99)
Potassium: 4.4 mEq/L (ref 3.5–5.1)
SODIUM: 140 meq/L (ref 135–145)
TOTAL PROTEIN: 7.1 g/dL (ref 6.0–8.3)
Total Bilirubin: 0.3 mg/dL (ref 0.2–1.2)

## 2018-01-29 LAB — CBC
HCT: 36.5 % (ref 36.0–46.0)
Hemoglobin: 12 g/dL (ref 12.0–15.0)
MCHC: 32.8 g/dL (ref 30.0–36.0)
MCV: 93.8 fl (ref 78.0–100.0)
PLATELETS: 251 10*3/uL (ref 150.0–400.0)
RBC: 3.89 Mil/uL (ref 3.87–5.11)
RDW: 13.4 % (ref 11.5–15.5)
WBC: 6.1 10*3/uL (ref 4.0–10.5)

## 2018-01-29 LAB — LIPID PANEL
CHOLESTEROL: 155 mg/dL (ref 0–200)
HDL: 61.2 mg/dL (ref >39.00)
LDL CALC: 71 mg/dL (ref 0–99)
NonHDL: 93.55
Total CHOL/HDL Ratio: 3
Triglycerides: 115 mg/dL (ref 0.0–149.0)
VLDL: 23 mg/dL (ref 0.0–40.0)

## 2018-01-29 LAB — TSH: TSH: 3.17 u[IU]/mL (ref 0.35–4.50)

## 2018-02-03 ENCOUNTER — Ambulatory Visit (INDEPENDENT_AMBULATORY_CARE_PROVIDER_SITE_OTHER): Payer: Medicare Other | Admitting: Primary Care

## 2018-02-03 ENCOUNTER — Encounter: Payer: Self-pay | Admitting: Primary Care

## 2018-02-03 VITALS — BP 94/50 | HR 72 | Temp 97.5°F | Ht <= 58 in | Wt 146.0 lb

## 2018-02-03 DIAGNOSIS — Z79890 Hormone replacement therapy (postmenopausal): Secondary | ICD-10-CM | POA: Diagnosis not present

## 2018-02-03 DIAGNOSIS — E785 Hyperlipidemia, unspecified: Secondary | ICD-10-CM

## 2018-02-03 DIAGNOSIS — D649 Anemia, unspecified: Secondary | ICD-10-CM

## 2018-02-03 DIAGNOSIS — M150 Primary generalized (osteo)arthritis: Secondary | ICD-10-CM | POA: Diagnosis not present

## 2018-02-03 DIAGNOSIS — I341 Nonrheumatic mitral (valve) prolapse: Secondary | ICD-10-CM

## 2018-02-03 DIAGNOSIS — R002 Palpitations: Secondary | ICD-10-CM

## 2018-02-03 DIAGNOSIS — K601 Chronic anal fissure: Secondary | ICD-10-CM

## 2018-02-03 DIAGNOSIS — I340 Nonrheumatic mitral (valve) insufficiency: Secondary | ICD-10-CM

## 2018-02-03 DIAGNOSIS — K219 Gastro-esophageal reflux disease without esophagitis: Secondary | ICD-10-CM

## 2018-02-03 DIAGNOSIS — Z23 Encounter for immunization: Secondary | ICD-10-CM

## 2018-02-03 DIAGNOSIS — Z0000 Encounter for general adult medical examination without abnormal findings: Secondary | ICD-10-CM | POA: Diagnosis not present

## 2018-02-03 DIAGNOSIS — M170 Bilateral primary osteoarthritis of knee: Secondary | ICD-10-CM

## 2018-02-03 DIAGNOSIS — M25512 Pain in left shoulder: Secondary | ICD-10-CM

## 2018-02-03 DIAGNOSIS — M159 Polyosteoarthritis, unspecified: Secondary | ICD-10-CM

## 2018-02-03 DIAGNOSIS — M797 Fibromyalgia: Secondary | ICD-10-CM

## 2018-02-03 DIAGNOSIS — G8929 Other chronic pain: Secondary | ICD-10-CM

## 2018-02-03 DIAGNOSIS — M8949 Other hypertrophic osteoarthropathy, multiple sites: Secondary | ICD-10-CM

## 2018-02-03 DIAGNOSIS — M8589 Other specified disorders of bone density and structure, multiple sites: Secondary | ICD-10-CM

## 2018-02-03 DIAGNOSIS — E039 Hypothyroidism, unspecified: Secondary | ICD-10-CM

## 2018-02-03 MED ORDER — ZOSTER VAC RECOMB ADJUVANTED 50 MCG/0.5ML IM SUSR: 0.5000 mL | Freq: Once | INTRAMUSCULAR | 1 refills | Status: AC

## 2018-02-03 MED ORDER — CLOTRIMAZOLE-BETAMETHASONE 1-0.05 % EX CREA: 1.0000 "application " | TOPICAL_CREAM | Freq: Two times a day (BID) | CUTANEOUS | 0 refills | Status: DC

## 2018-02-03 MED ORDER — FAMOTIDINE 20 MG PO TABS: ORAL_TABLET | ORAL | 0 refills | Status: DC

## 2018-02-03 NOTE — Assessment & Plan Note (Signed)
Recently taking Zantac PRN, now this has been recalled. Will switch to famotidine 20 mg. She will update. Once on Nexium and did well. Continue to monitor.

## 2018-02-03 NOTE — Assessment & Plan Note (Signed)
Td UTD, pneumonia and influenza vaccinations provided today. Mammogram UTD. Colonoscopy UTD and doesn't need repeating. Bone density scan UTD. Discussed to continue with regular exercise, work on diet. Exam stable. Labs reviewed.

## 2018-02-03 NOTE — Assessment & Plan Note (Signed)
Chronic, following with rheumatology. Continue same.

## 2018-02-03 NOTE — Assessment & Plan Note (Signed)
Following with rheumatology and orthopedics.  Symptoms to knees, hands, feet, shoulders. Received cortisone injections to bilateral knees per orthopedics. Will be undergoing cortisone injection to left shoulder per orthopedics soon.

## 2018-02-03 NOTE — Assessment & Plan Note (Signed)
Recent lipid panel stable, continue fenofibrate.

## 2018-02-03 NOTE — Assessment & Plan Note (Signed)
Following with orthopedics, undergoing injections to bilateral knees.

## 2018-02-03 NOTE — Assessment & Plan Note (Signed)
Recent bone density scan with osteopenia. Compliant to calcium and vitamin D.

## 2018-02-03 NOTE — Progress Notes (Signed)
Subjective:    Patient ID: [name] [name], female    DOB: 02/07/1936, 82 y.o.   MRN: [id number]  HPI  [name] [name] is an 82 year old female who presents today for complete physical.  Immunizations: -Tetanus: Completed in 2019 -Influenza: Due today -Pneumonia: Completed "years ago".  -Shingles: Never completed   Diet: She endorses a healthy diet Breakfast: Egg whites, bacon, bread Lunch: Sandwich, frozen dinners Dinner: Meat, vegetable, starch Snacks: Pretzels, nuts Desserts: Candy after lunch, daily.  Beverages: Coffee, water, diet soda  Exercise: She works out at [employer] three days weekly  Eye exam: Completed in 2019 Dental exam: Completes semi-annually  Colonoscopy: Completed in 2010 Dexa: Completed in 2019 Mammogram: Completed in 2019  BP Readings from Last 3 Encounters:  02/03/18 (!) 94/50  11/25/17 119/76  09/19/17 (!) 127/56      Review of Systems  Constitutional: Negative for unexpected weight change.  HENT: Negative for rhinorrhea.   Respiratory: Negative for cough and shortness of breath.   Cardiovascular: Negative for chest pain.  Gastrointestinal: Negative for constipation and diarrhea.  Genitourinary: Negative for difficulty urinating.  Musculoskeletal: Positive for arthralgias, back pain and myalgias.  Skin: Negative for rash.  Allergic/Immunologic: Negative for environmental allergies.  Neurological: Negative for dizziness, numbness and headaches.  Psychiatric/Behavioral: The patient is not nervous/anxious.        Past Medical History:  Diagnosis Date  . Anxiety    prev on prozac  . Chest pain    Felt to be noncardiac in the past  . Chronic rectal fissure   . Colon polyp   . Dyslipidemia   . Ejection fraction    EF 60%, echo, 2007, atrial septum bows left to right compatible with increased left atrial pressure  . Fibromyalgia   . GERD (gastroesophageal reflux disease)    Barrett's esophagus  . Hiatal hernia   . Lumbar spine pain    Complex  lumbar spine surgery at [facility], 1324, complicated, MRSA, much of the appliance is removed, patient on chronic antibiotics  . Melanoma ([place])   . Mitral regurgitation    Mild, 2007,  /  moderate or severe echo, September, 2012, eccentric jet wrapping the left atrium, consider TEE for further assessment  . Mitral valve prolapse    Mild mitral regurgitation echo, 2007  . Osteoarthritis   . Scoliosis   . Sinusitis    Multiple sinus operations in the past  . Statin intolerance    As of 2009, no further attempts to use statins  . Thyroid nodule      Social History   Socioeconomic History  . Marital status: Married    Spouse name: Not on file  . Number of children: Not on file  . Years of education: Not on file  . Highest education level: Not on file  Occupational History  . Not on file  Social Needs  . Financial resource strain: Not on file  . Food insecurity:    Worry: Not on file    Inability: Not on file  . Transportation needs:    Medical: Not on file    Non-medical: Not on file  Tobacco Use  . Smoking status: Never Smoker  . Smokeless tobacco: Never Used  Substance and Sexual Activity  . Alcohol use: No  . Drug use: No  . Sexual activity: Not on file  Lifestyle  . Physical activity:    Days per week: Not on file    Minutes per session: Not on  file  . Stress: Not on file  Relationships  . Social connections:    Talks on phone: Not on file    Gets together: Not on file    Attends religious service: Not on file    Active member of club or organization: Not on file    Attends meetings of clubs or organizations: Not on file    Relationship status: Not on file  . Intimate partner violence:    Fear of current or ex partner: Not on file    Emotionally abused: Not on file    Physically abused: Not on file    Forced sexual activity: Not on file  Other Topics Concern  . Not on file  Social History Narrative   From [place]   Married (second [phone]   3 kids, 2 of  whom are local   Retired, Camera operator    Past Surgical History:  Procedure Laterality Date  . ABDOMINAL HYSTERECTOMY    . BACK SURGERY    . CHOLECYSTECTOMY    . FOOT SURGERY    . HERNIA REPAIR    . NASAL SINUS SURGERY      Family History  Problem Relation Age of Onset  . GER disease Mother   . Dementia Mother   . Breast cancer Mother   . Heart disease Father        MI at 55  . Scoliosis Daughter   . Colon cancer Neg Hx     Allergies  Allergen Reactions  . Other Other (See Comments)    Other Reaction: when taking for an extended period of time causes mouth ulcers and esophagus irritation Other Reaction: when taking for an extended period of time causes mouth ulcers and esophagus irritation   . Fenofibric Acid Other (See Comments)    GI upset  . Nsaids     REACTION: No long term use  . Statins Other (See Comments)    Intolerant, myalgias Intolerant, myalgias Intolerant, myalgias Intolerant, myalgias Intolerant, myalgias Intolerant, myalgias   . Trilipix [Choline Fenofibrate]     GI upset  . Wasp Venom Swelling    Current Outpatient Medications on File Prior to Visit  Medication Sig Dispense Refill  . ALPHA LIPOIC ACID PO Take 1 tablet by mouth daily. Daily     . Biotin 10 MG TABS Take 1 tablet by mouth daily. Daily     . calcium carbonate (OS-CAL) 600 MG TABS Take 1,200 mg by mouth 2 (two) times daily with a meal.    . cholecalciferol (VITAMIN D) 1000 UNITS tablet Take 1,000 Units by mouth daily.    . fenofibrate micronized (LOFIBRA) 134 MG capsule TAKE 1 CAPSULE (134 MG TOTAL) BY MOUTH AT BEDTIME. 90 capsule 1  . fish oil-omega-3 fatty acids 1000 MG capsule Take 2 g by mouth daily.    . fluticasone (FLONASE) 50 MCG/ACT nasal spray SPRAY 2 SPRAYS INTO EACH NOSTRIL EVERY DAY    . gabapentin (NEURONTIN) 100 MG capsule TAKE 1 CAPSULE BY MOUTH THREE TIMES A DAY 180 capsule 0  . Ginger, Zingiber officinalis, (GINGER PO) Take 1 tablet by mouth daily.    Marland Kitchen  levothyroxine (SYNTHROID, LEVOTHROID) 50 MCG tablet TAKE 1 TABLET BY MOUTH EVERY MORNING ON AN EMPTY STOMACH WITH A FULL GLASS OF WATER. 90 tablet 1  . metoprolol tartrate (LOPRESSOR) 50 MG tablet TAKE 1 TABLET ( 50 MG TOTAL) BY MOUTH IN THE MORNING AND 1/2 TABLET BY MOUTH IN THE EVENING. Please keep upcoming  appt in March. Thank you 135 tablet 3  . NON FORMULARY Take 1 capsule by mouth daily. Tumeric      Daily     . PENNSAID 2 % SOLN PLACE 2 PUMP ONTO THE SKIN 2 (TWO) TIMES DAILY.  1  . PENNSAID 2 % SOLN PLACE 2 PUMP ONTO THE SKIN 2 (TWO) TIMES DAILY. 112 g 1  . PREMARIN 0.3 MG tablet TAKE 1 TABLET (0.3 MG TOTAL) BY MOUTH DAILY. 90 tablet 0  . sulfamethoxazole-trimethoprim (BACTRIM DS) 800-160 MG per tablet Take 1 tablet by mouth 2 (two) times daily.     Marland Kitchen tretinoin (RETIN-A) 0.05 % cream Apply 1 application topically daily as needed (as directed per dermatologist).   3   No current facility-administered medications on file prior to visit.     BP (!) 94/50 (BP Location: Right Arm, Patient Position: Sitting, Cuff Size: Large)   Pulse 72   Temp (!) 97.5 F (36.4 C) (Oral)   Ht 4\' 9"  (1.448 m)   Wt 146 lb (66.2 kg)   SpO2 95%   BMI 31.59 kg/m    Objective:   Physical Exam  Constitutional: She is oriented to person, place, and time. She appears well-nourished.  HENT:  Mouth/Throat: No oropharyngeal exudate.  Eyes: Pupils are equal, round, and reactive to light. EOM are normal.  Neck: Neck supple. No thyromegaly present.  Cardiovascular: Normal rate and regular rhythm.  Respiratory: Effort normal and breath sounds normal.  GI: Soft. Bowel sounds are normal. There is no tenderness.  Musculoskeletal:       Right knee: She exhibits decreased range of motion and swelling. She exhibits no erythema. No tenderness found.  Generalized decrease in ROM to knees, hands, lumbar spine  Neurological: She is alert and oriented to person, place, and time.  Skin: Skin is warm and dry.    Psychiatric: She has a normal mood and affect.           Assessment & Plan:

## 2018-02-03 NOTE — Assessment & Plan Note (Signed)
Compliant to premarin 0.3 mg every other day. Continue same. She is aware of potential risks of long term use.

## 2018-02-03 NOTE — Assessment & Plan Note (Signed)
Following with cardiology. 

## 2018-02-03 NOTE — Assessment & Plan Note (Signed)
Working with orthopedics, will undergo injection.

## 2018-02-03 NOTE — Assessment & Plan Note (Signed)
Following with cardiology, continue same.

## 2018-02-03 NOTE — Assessment & Plan Note (Signed)
Chronic with rectal irritation during flares. Was once prescribed Lotrisone cream for which she used PRN during flares. Flares occurred with episodes of diarrhea.

## 2018-02-03 NOTE — Assessment & Plan Note (Signed)
TSH stable on recent labs, continue levothyroxine 50 mcg daily.

## 2018-02-03 NOTE — Assessment & Plan Note (Signed)
Recent CBC unremarkable.  °

## 2018-02-03 NOTE — Patient Instructions (Signed)
Take the shingles vaccination to the pharmacy in one month.  You may apply the clotrimazole-betamethasone cream to the rectal area twice daily as needed for discomfort.  Try taking famotidine 20 mg for heartburn. Take 1 tablet by mouth once or twice daily. Please update me if this doesn't help with your heartburn.   Continue to follow up with orthopedics and rheumatology.  Make sure to continue with regular exercise and a healthy diet.  Ensure you are consuming 64 ounces of water daily.  We will see you in one year for your physical and wellness visit.   It was a pleasure to see you today!    Preventive Care 80 Years and Older, Female Preventive care refers to lifestyle choices and visits with your health care provider that can promote health and wellness. What does preventive care include?  A yearly physical exam. This is also called an annual well check.  Dental exams once or twice a year.  Routine eye exams. Ask your health care provider how often you should have your eyes checked.  Personal lifestyle choices, including: ? Daily care of your teeth and gums. ? Regular physical activity. ? Eating a healthy diet. ? Avoiding tobacco and drug use. ? Limiting alcohol use. ? Practicing safe sex. ? Taking low-dose aspirin every day. ? Taking vitamin and mineral supplements as recommended by your health care provider. What happens during an annual well check? The services and screenings done by your health care provider during your annual well check will depend on your age, overall health, lifestyle risk factors, and family history of disease. Counseling Your health care provider may ask you questions about your:  Alcohol use.  Tobacco use.  Drug use.  Emotional well-being.  Home and relationship well-being.  Sexual activity.  Eating habits.  History of falls.  Memory and ability to understand (cognition).  Work and work Statistician.  Reproductive  health.  Screening You may have the following tests or measurements:  Height, weight, and BMI.  Blood pressure.  Lipid and cholesterol levels. These may be checked every 5 years, or more frequently if you are over 46 years old.  Skin check.  Lung cancer screening. You may have this screening every year starting at age 35 if you have a 30-pack-year history of smoking and currently smoke or have quit within the past 15 years.  Fecal occult blood test (FOBT) of the stool. You may have this test every year starting at age 4.  Flexible sigmoidoscopy or colonoscopy. You may have a sigmoidoscopy every 5 years or a colonoscopy every 10 years starting at age 21.  Hepatitis C blood test.  Hepatitis B blood test.  Sexually transmitted disease (STD) testing.  Diabetes screening. This is done by checking your blood sugar (glucose) after you have not eaten for a while (fasting). You may have this done every 1-3 years.  Bone density scan. This is done to screen for osteoporosis. You may have this done starting at age 65.  Mammogram. This may be done every 1-2 years. Talk to your health care provider about how often you should have regular mammograms.  Talk with your health care provider about your test results, treatment options, and if necessary, the need for more tests. Vaccines Your health care provider may recommend certain vaccines, such as:  Influenza vaccine. This is recommended every year.  Tetanus, diphtheria, and acellular pertussis (Tdap, Td) vaccine. You may need a Td booster every 10 years.  Varicella vaccine. You may need  this if you have not been vaccinated.  Zoster vaccine. You may need this after age 6.  Measles, mumps, and rubella (MMR) vaccine. You may need at least one dose of MMR if you were born in 1957 or later. You may also need a second dose.  Pneumococcal 13-valent conjugate (PCV13) vaccine. One dose is recommended after age 50.  Pneumococcal polysaccharide  (PPSV23) vaccine. One dose is recommended after age 15.  Meningococcal vaccine. You may need this if you have certain conditions.  Hepatitis A vaccine. You may need this if you have certain conditions or if you travel or work in places where you may be exposed to hepatitis A.  Hepatitis B vaccine. You may need this if you have certain conditions or if you travel or work in places where you may be exposed to hepatitis B.  Haemophilus influenzae type b (Hib) vaccine. You may need this if you have certain conditions.  Talk to your health care provider about which screenings and vaccines you need and how often you need them. This information is not intended to replace advice given to you by your health care provider. Make sure you discuss any questions you have with your health care provider. Document Released: 04/28/2015 Document Revised: 12/20/2015 Document Reviewed: 01/31/2015 Elsevier Interactive Patient Education  [employer].

## 2018-02-03 NOTE — Assessment & Plan Note (Signed)
Doing well on metoprolol tartrate, continue same.

## 2018-02-06 ENCOUNTER — Telehealth (INDEPENDENT_AMBULATORY_CARE_PROVIDER_SITE_OTHER): Payer: Self-pay

## 2018-02-06 NOTE — Telephone Encounter (Signed)
PA required for SynviscOne, bilateral knee. Faxed completed PA form to [employer] at [phone]. 

## 2018-02-09 ENCOUNTER — Ambulatory Visit (INDEPENDENT_AMBULATORY_CARE_PROVIDER_SITE_OTHER): Payer: Medicare Other | Admitting: Physical Medicine and Rehabilitation

## 2018-02-09 ENCOUNTER — Ambulatory Visit (INDEPENDENT_AMBULATORY_CARE_PROVIDER_SITE_OTHER): Payer: Self-pay

## 2018-02-09 DIAGNOSIS — M542 Cervicalgia: Secondary | ICD-10-CM

## 2018-02-09 DIAGNOSIS — G8929 Other chronic pain: Secondary | ICD-10-CM | POA: Diagnosis not present

## 2018-02-09 DIAGNOSIS — M25512 Pain in left shoulder: Secondary | ICD-10-CM

## 2018-02-09 DIAGNOSIS — M797 Fibromyalgia: Secondary | ICD-10-CM | POA: Diagnosis not present

## 2018-02-09 DIAGNOSIS — M47812 Spondylosis without myelopathy or radiculopathy, cervical region: Secondary | ICD-10-CM

## 2018-02-09 NOTE — Progress Notes (Signed)
 .  Numeric Pain Rating Scale and Functional Assessment Average Pain 8   In the last MONTH (on 0-10 scale) has pain interfered with the following?  1. General activity like being  able to carry out your everyday physical activities such as walking, climbing stairs, carrying groceries, or moving a chair?  Rating(8)   +Driver, -BT, -Dye Allergies.  

## 2018-02-09 NOTE — Patient Instructions (Signed)

## 2018-02-09 NOTE — Progress Notes (Signed)
[name] [name] - 82 y.o. female MRN [id number]  Date of birth: 09-Feb-1936  Office Visit Note: Visit Date: 02/09/2018 PCP: [name], NP Referred by: [name], NP  Subjective: Chief Complaint  Patient presents with  . Left Shoulder - Pain  . Neck - Pain   HPI: [name] [name] is a 82 y.o. female who comes in today For diagnostic and hopefully therapeutic intra-articular injection of the glenohumeral joint on the left as requested by Dr. Jean Rosenthal.  She also reports to me today history of cervical spine pain and prior issues with lumbar spine surgery.  She underwent complex spine surgery for scoliosis at [facility] in 2012 of her lumbar spine.  She reports at the time she was told she needed surgery of her cervical spine as well.  She does report neck pain and obviously the shoulder pain at this point with known osteoarthritis of the glenohumeral joint.  She did have an MRI from 2015 that I did review with patient briefly today.  This showed multilevel degenerative changes with foraminal narrowing at various levels but no real high-grade central canal stenosis.  She gets rare tingling in the hands but nothing really shooting down the arms.  She has a lot of neck pain in general.  She has had multiple bouts of physical therapy and conservative care for her neck pain no real relief.  Evaluation today shows that she might be amenable to eventual spine procedure of the cervical spine we did talk about that today.  Her case is complicated by history of fibromyalgia.  Review of Systems  Constitutional: Negative for chills, fever, malaise/fatigue and weight loss.  HENT: Negative for hearing loss and sinus pain.   Eyes: Negative for blurred vision, double vision and photophobia.  Respiratory: Negative for cough and shortness of breath.   Cardiovascular: Negative for chest pain, palpitations and leg swelling.  Gastrointestinal: Negative for abdominal pain, nausea and vomiting.    Genitourinary: Negative for flank pain.  Musculoskeletal: Positive for joint pain and neck pain. Negative for myalgias.  Skin: Negative for itching and rash.  Neurological: Negative for tremors, focal weakness and weakness.  Endo/Heme/Allergies: Negative.   Psychiatric/Behavioral: Negative for depression.  All other systems reviewed and are negative.  Otherwise per HPI.  Assessment & Plan: Visit Diagnoses:  1. Chronic left shoulder pain   2. Cervical spondylosis without myelopathy   3. Cervicalgia   4. Fibromyalgia     Plan: Findings:  1.  Left shoulder pain chronic worsening and severe managed by Dr. Ninfa Linden who requested intra-articular injection of the left glenohumeral joint.  We did do that today as requested and she did have some relief during the anesthetic phase of the injection.  She will continue to follow with Dr. Ninfa Linden and Dr. Estanislado Pandy with this.  2.  Chronic history of scoliosis and neck and back pain status post complex spine surgery of the lumbar spine at [facility] in 2012 I believe.  MRI of the cervical spine done at the same time shows multilevel degenerative changes in the more recent cervical spine MRI reviewed again with the patient today briefly shows again multilevel degenerative changes with some foraminal narrowing but no high-grade central stenosis.  At least to my as this would not indicate a need for cervical surgery per se although there is some foraminal narrowing.  We talked about different types of interventional treatment for that and she may consider that.  Her case is complicated by fibromyalgia.  Meds & Orders: No orders of the defined types were placed in this encounter.   Orders Placed This Encounter  Procedures  . Large Joint Inj: L glenohumeral  . XR C-ARM NO REPORT    Follow-up: Return if symptoms worsen or fail to improve.   Procedures: Large Joint Inj: L glenohumeral on 02/09/2018 8:47 AM Indications: pain and diagnostic  evaluation Details: 22 G 3.5 in needle, fluoroscopy-guided anteromedial approach  Arthrogram: No  Medications: 80 mg triamcinolone acetonide 40 MG/ML; 3 mL bupivacaine 0.5 % Outcome: tolerated well, no immediate complications  There was excellent flow of contrast producing a partial arthrogram of the glenohumeral joint. The patient did have relief of symptoms during the anesthetic phase of the injection. Procedure, treatment alternatives, risks and benefits explained, specific risks discussed. Consent was given by the patient. Immediately prior to procedure a time out was called to verify the correct patient, procedure, equipment, support staff and site/side marked as required. Patient was prepped and draped in the usual sterile fashion.      No notes on file   Clinical History: Interface, Rad Results In - 02/11/2014  5:45 PM EDT MRI of the cervical spine  Indication: Cervical myelopathy. Balance and hand coordination complaints.  Comparison: 02/13/2011  Technique: Sagittal T1-weighted and fat-suppressed T2-weighted FSE sequences, as well as axial T1-weighted, T2-weighted FSE, and gradient echo sequences, were obtained of the cervical spine.   Findings: The visualized posterior fossa is normal in appearance.  The cervical spinal cord has normal signal intensity.  Multiple level degenerative disc height loss, degenerative disc desiccation, and degenerative endplate changes.  C1-C2: Anatomic alignment. No canal stenosis.  C2-C3: Mild broad-based disc bulge. Left facet degeneration. Mild left foraminal narrowing. No canal stenosis.  C3-C4:  Broad-based disc-osteophyte complex. Severe right and moderate left foraminal stenosis. Mild canal stenosis.  C4-C5: Broad-based disc-osteophyte complex. Moderate bilateral foraminal stenosis. Mild canal stenosis.  C5-C6: Broad-based disc-osteophyte complex. Mild left foraminal stenosis. Mild canal stenosis.  C6-C7:  Broad-based  disc-osteophyte complex. Moderate left foraminal stenosis. Mild canal stenosis.  C7-T1: Broad-based disc-osteophyte complex. Moderate left and mild right foraminal stenosis. No canal stenosis.   Impression: Multilevel degenerative disc disease with multilevel foraminal and canal stenosis, not significantly changed from 02/13/2011. No cord edema/myelomalacia noted.   She reports that she has never smoked. She has never used smokeless tobacco.  Recent Labs    11/25/17 1554  LABURIC 3.7    Objective:  VS:  HT:    WT:   BMI:     BP:   HR: bpm  TEMP: ( )  RESP:  Physical Exam  Constitutional: She is oriented to person, place, and time. She appears well-developed and well-nourished.  Eyes: Pupils are equal, round, and reactive to light. Conjunctivae and EOM are normal.  Neck: Neck supple. No JVD present. No tracheal deviation present.  Cardiovascular: Normal rate and intact distal pulses.  Pulmonary/Chest: Effort normal.  Musculoskeletal:  Cervical spine range of motion is limited in ranges of rotation with increased pain some pain with extension.  She sits with forward flexed cervical spine.  There are tender points in active trigger points across the paraspinal and trapezius and levator scapular regions.  She does have significant pain with rotation of the left shoulder with impingement.  She has limited range of motion on the left.  She has good strength in the hands with finger abduction wrist extension and long finger flexion.  Neurological: She is alert and oriented to person, place, and time.  She exhibits normal muscle tone. Coordination normal.  Skin: Skin is warm and dry. No rash noted. No erythema.  Psychiatric: She has a normal mood and affect. Her behavior is normal.  Nursing note and vitals reviewed.   Ortho Exam Imaging: No results found.  Past Medical/Family/Surgical/Social History: Medications & Allergies reviewed per EMR, new medications updated. Patient Active  Problem List   Diagnosis Date Noted  . 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 Chronic left shoulder pain 01/21/2018  . Chronic pain of left knee 01/21/2018  . Chronic pain of right knee 01/21/2018  . Osteoarthritis of glenohumeral joint, left 01/21/2018  . Decreased renal function 01/29/2017  . Anemia 01/29/2017  . Preventative health care 01/29/2017  . Other fatigue 09/03/2016  . Primary insomnia 09/03/2016  . Primary osteoarthritis of both knees 09/03/2016  . DJD (degenerative joint disease), cervical 09/03/2016  . Osteopenia 03/04/2016  . Palpitations 09/19/2014  . Thyroid nodule, uninodular 09/18/2011  . Hypothyroid 08/19/2011  . Postmenopausal HRT (hormone replacement therapy) 08/13/2011  . Mitral regurgitation   . Lumbar spine pain   . Dyslipidemia   . Chest pain   . Statin intolerance   . Mitral valve prolapse   . Ejection fraction   . Fibromyalgia   . ADENOMATOUS COLONIC POLYP 08/15/2007  . GERD 08/15/2007  . BARRETTS ESOPHAGUS 08/15/2007  . HIATAL HERNIA 08/15/2007  . Osteoarthritis 08/15/2007  . CATARACT EXTRACTION, HX OF 08/15/2007   Past Medical History:  Diagnosis Date  . Anxiety    prev on prozac  . Chest pain    Felt to be noncardiac in the past  . Chronic rectal fissure   . Colon polyp   . Dyslipidemia   . Ejection fraction    EF 60%, echo, 2007, atrial septum bows left to right compatible with increased left atrial pressure  . Fibromyalgia   . GERD (gastroesophageal reflux disease)    Barrett's esophagus  . Hiatal hernia   . Lumbar spine pain    Complex lumbar spine surgery at [facility], 6213, complicated, MRSA, much of the appliance is removed, patient on chronic antibiotics  . Melanoma ([place])   . Mitral regurgitation    Mild, 2007,  /  moderate or severe echo, September, 2012, eccentric jet wrapping the left atrium, consider TEE for further assessment  . Mitral valve prolapse    Mild mitral regurgitation echo, 2007  . Osteoarthritis   . Scoliosis    . Sinusitis    Multiple sinus operations in the past  . Statin intolerance    As of 2009, no further attempts to use statins  . Thyroid nodule    Family History  Problem Relation Age of Onset  . GER disease Mother   . Dementia Mother   . Breast cancer Mother   . Heart disease Father        MI at 64  . Scoliosis Daughter   . Colon cancer Neg Hx    Past Surgical History:  Procedure Laterality Date  . ABDOMINAL HYSTERECTOMY    . BACK SURGERY    . CHOLECYSTECTOMY    . FOOT SURGERY    . HERNIA REPAIR    . NASAL SINUS SURGERY     Social History   Occupational History  . Not on file  Tobacco Use  . Smoking status: Never Smoker  . Smokeless tobacco: Never Used  Substance and Sexual Activity  . Alcohol use: No  . Drug use: No  . Sexual activity: Not on file

## 2018-02-11 ENCOUNTER — Telehealth (INDEPENDENT_AMBULATORY_CARE_PROVIDER_SITE_OTHER): Payer: Self-pay

## 2018-02-11 NOTE — Telephone Encounter (Signed)
Patient is approved for SynviscOne, bilateral knee. Buy & Bill Covered at 100% through [employer]. Co-pay of $144.00? PA required PA Approval# [id number] Valid 02/06/2018- 02/06/2019  Appt.scheduled 02/18/2018

## 2018-02-18 ENCOUNTER — Encounter (INDEPENDENT_AMBULATORY_CARE_PROVIDER_SITE_OTHER): Payer: Self-pay | Admitting: Orthopaedic Surgery

## 2018-02-18 ENCOUNTER — Ambulatory Visit (INDEPENDENT_AMBULATORY_CARE_PROVIDER_SITE_OTHER): Payer: Medicare Other | Admitting: Orthopaedic Surgery

## 2018-02-18 ENCOUNTER — Encounter (INDEPENDENT_AMBULATORY_CARE_PROVIDER_SITE_OTHER): Payer: Self-pay | Admitting: Physical Medicine and Rehabilitation

## 2018-02-18 DIAGNOSIS — M1712 Unilateral primary osteoarthritis, left knee: Secondary | ICD-10-CM | POA: Diagnosis not present

## 2018-02-18 DIAGNOSIS — M1711 Unilateral primary osteoarthritis, right knee: Secondary | ICD-10-CM

## 2018-02-18 MED ORDER — HYALURONAN 88 MG/4ML IX SOSY
88.0000 mg | PREFILLED_SYRINGE | INTRA_ARTICULAR | Status: AC | PRN
  Administered 2018-02-18: 88 mg via INTRA_ARTICULAR

## 2018-02-18 MED ORDER — TRIAMCINOLONE ACETONIDE 40 MG/ML IJ SUSP
80.0000 mg | INTRAMUSCULAR | Status: AC | PRN
  Administered 2018-02-09: 80 mg via INTRA_ARTICULAR

## 2018-02-18 MED ORDER — LIDOCAINE HCL 1 % IJ SOLN
0.5000 mL | INTRAMUSCULAR | Status: AC | PRN
  Administered 2018-02-18: .5 mL

## 2018-02-18 MED ORDER — BUPIVACAINE HCL 0.5 % IJ SOLN
3.0000 mL | INTRAMUSCULAR | Status: AC | PRN
  Administered 2018-02-09: 3 mL via INTRA_ARTICULAR

## 2018-02-18 NOTE — Progress Notes (Signed)
   Procedure Note  Patient: [name] [name]             Date of Birth: 1935-09-09           MRN: [id number]             Visit Date: 02/18/2018  HPI: Mr. Stensland comes in today for bilateral knee Synvisc injections.  She states that the cortisone helps some with her knees.  She feels that she does have fluid on her right knee today.  She is had no new injury to either knee.  Physical exam bilateral knees right knee slight flexion contracture positive effusion.  No erythema abnormal warmth right knee.  She has tenderness along the lateral joint line of the right knee.  Left knee full extension and good range of motion with some discomfort.  Has tenderness medial joint line.  No effusion abnormal warmth erythema of the left knee.  Procedures: Visit Diagnoses: Unilateral primary osteoarthritis, right knee  Unilateral primary osteoarthritis, left knee  Large Joint Inj: bilateral knee on 02/18/2018 9:52 AM Indications: pain Details: 22 G 1.5 in needle, anterolateral approach  Arthrogram: No  Medications (Right): 0.5 mL lidocaine 1 %; 88 mg Hyaluronan 88 MG/4ML Aspirate (Right): 20 mL blood-tinged and yellow Medications (Left): 0.5 mL lidocaine 1 %; 88 mg Hyaluronan 88 MG/4ML Outcome: tolerated well, no immediate complications Procedure, treatment alternatives, risks and benefits explained, specific risks discussed. Consent was given by the patient. Immediately prior to procedure a time out was called to verify the correct patient, procedure, equipment, support staff and site/side marked as required. Patient was prepped and draped in the usual sterile fashion.     Plan: She understands that she can have cortisone injections in 3 months in the knees.  Repeat Monovisc injection 6 months from now.  She will follow-up on his as-needed basis.

## 2018-03-02 ENCOUNTER — Other Ambulatory Visit: Payer: Self-pay | Admitting: Primary Care

## 2018-03-02 DIAGNOSIS — E039 Hypothyroidism, unspecified: Secondary | ICD-10-CM

## 2018-03-03 ENCOUNTER — Other Ambulatory Visit: Payer: Self-pay | Admitting: Rheumatology

## 2018-03-03 NOTE — Telephone Encounter (Signed)
Last Visit: 11/25/17 Next visit: 03/24/18  Okay to refill per Dr.Deveshwar

## 2018-03-11 NOTE — Progress Notes (Deleted)
Office Visit Note  Patient: [name] [name]             Date of Birth: 19-Dec-1935           MRN: [id number]             PCP: [name], NP Referring: [name], NP Visit Date: 03/24/2018 Occupation: @GUAROCC @  Subjective:  No chief complaint on file.   History of Present Illness: [name] [name] is a 82 y.o. female ***   Activities of Daily Living:  Patient reports morning stiffness for *** {minute/hour:19697}.   Patient {ACTIONS;DENIES/REPORTS:21021675::"Denies"} nocturnal pain.  Difficulty dressing/grooming: {ACTIONS;DENIES/REPORTS:21021675::"Denies"} Difficulty climbing stairs: {ACTIONS;DENIES/REPORTS:21021675::"Denies"} Difficulty getting out of chair: {ACTIONS;DENIES/REPORTS:21021675::"Denies"} Difficulty using hands for taps, buttons, cutlery, and/or writing: {ACTIONS;DENIES/REPORTS:21021675::"Denies"}  No Rheumatology ROS completed.   PMFS History:  Patient Active Problem List   Diagnosis Date Noted  . 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 Chronic left shoulder pain 01/21/2018  . Chronic pain of left knee 01/21/2018  . Chronic pain of right knee 01/21/2018  . Osteoarthritis of glenohumeral joint, left 01/21/2018  . Decreased renal function 01/29/2017  . Anemia 01/29/2017  . Preventative health care 01/29/2017  . Other fatigue 09/03/2016  . Primary insomnia 09/03/2016  . Primary osteoarthritis of both knees 09/03/2016  . DJD (degenerative joint disease), cervical 09/03/2016  . Osteopenia 03/04/2016  . Palpitations 09/19/2014  . Thyroid nodule, uninodular 09/18/2011  . Hypothyroid 08/19/2011  . Postmenopausal HRT (hormone replacement therapy) 08/13/2011  . Mitral regurgitation   . Lumbar spine pain   . Dyslipidemia   . Chest pain   . Statin intolerance   . Mitral valve prolapse   . Ejection fraction   . Fibromyalgia   . ADENOMATOUS COLONIC POLYP 08/15/2007  . GERD 08/15/2007  . BARRETTS ESOPHAGUS 08/15/2007  . HIATAL HERNIA 08/15/2007  .  Osteoarthritis 08/15/2007  . CATARACT EXTRACTION, HX OF 08/15/2007    Past Medical History:  Diagnosis Date  . Anxiety    prev on prozac  . Chest pain    Felt to be noncardiac in the past  . Chronic rectal fissure   . Colon polyp   . Dyslipidemia   . Ejection fraction    EF 60%, echo, 2007, atrial septum bows left to right compatible with increased left atrial pressure  . Fibromyalgia   . GERD (gastroesophageal reflux disease)    Barrett's esophagus  . Hiatal hernia   . Lumbar spine pain    Complex lumbar spine surgery at [facility], 6226, complicated, MRSA, much of the appliance is removed, patient on chronic antibiotics  . Melanoma ([place])   . Mitral regurgitation    Mild, 2007,  /  moderate or severe echo, September, 2012, eccentric jet wrapping the left atrium, consider TEE for further assessment  . Mitral valve prolapse    Mild mitral regurgitation echo, 2007  . Osteoarthritis   . Scoliosis   . Sinusitis    Multiple sinus operations in the past  . Statin intolerance    As of 2009, no further attempts to use statins  . Thyroid nodule     Family History  Problem Relation Age of Onset  . GER disease Mother   . Dementia Mother   . Breast cancer Mother   . Heart disease Father        MI at 77  . Scoliosis Daughter   . Colon cancer Neg Hx    Past Surgical History:  Procedure Laterality Date  . ABDOMINAL HYSTERECTOMY    .  BACK SURGERY    . CHOLECYSTECTOMY    . FOOT SURGERY    . HERNIA REPAIR    . NASAL SINUS SURGERY     Social History   Social History Narrative   From [place]   Married (second [phone]   3 kids, 2 of whom are local   Retired, Camera operator    Objective: Vital Signs: There were no vitals taken for this visit.   Physical Exam   Musculoskeletal Exam: ***  CDAI Exam: CDAI Score: Not documented Patient Global Assessment: Not documented; Provider Global Assessment: Not documented Swollen: Not documented; Tender: Not documented Joint  Exam   Not documented   There is currently no information documented on the homunculus. Go to the Rheumatology activity and complete the homunculus joint exam.  Investigation: No additional findings.  Imaging: No results found.  Recent Labs: Lab Results  Component Value Date   WBC 6.1 01/29/2018   HGB 12.0 01/29/2018   PLT 251.0 01/29/2018   NA 140 01/29/2018   K 4.4 01/29/2018   CL 101 01/29/2018   CO2 29 01/29/2018   GLUCOSE 100 (H) 01/29/2018   BUN 25 (H) 01/29/2018   CREATININE 1.15 01/29/2018   BILITOT 0.3 01/29/2018   ALKPHOS 56 01/29/2018   AST 14 01/29/2018   ALT 13 01/29/2018   PROT 7.1 01/29/2018   ALBUMIN 4.3 01/29/2018   CALCIUM 9.6 01/29/2018    Speciality Comments: No specialty comments available.  Procedures:  No procedures performed Allergies: Other; Fenofibric acid; Nsaids; Statins; Trilipix [choline fenofibrate]; and Wasp venom   Assessment / Plan:     Visit Diagnoses: No diagnosis found.   Orders: No orders of the defined types were placed in this encounter.  No orders of the defined types were placed in this encounter.   Face-to-face time spent with patient was *** minutes. Greater than 50% of time was spent in counseling and coordination of care.  Follow-Up Instructions: No follow-ups on file.   Earnestine Mealing, CMA  Note - This record has been created using Editor, commissioning.  Chart creation errors have been sought, but may not always  have been located. Such creation errors do not reflect on  the standard of medical care.

## 2018-03-18 ENCOUNTER — Ambulatory Visit (INDEPENDENT_AMBULATORY_CARE_PROVIDER_SITE_OTHER): Payer: Medicare Other | Admitting: Orthopaedic Surgery

## 2018-03-18 DIAGNOSIS — G8929 Other chronic pain: Secondary | ICD-10-CM

## 2018-03-18 DIAGNOSIS — M19012 Primary osteoarthritis, left shoulder: Secondary | ICD-10-CM | POA: Diagnosis not present

## 2018-03-18 DIAGNOSIS — M25512 Pain in left shoulder: Secondary | ICD-10-CM

## 2018-03-18 MED ORDER — METHOCARBAMOL 500 MG PO TABS: 500.0000 mg | ORAL_TABLET | Freq: Four times a day (QID) | ORAL | 0 refills | Status: DC | PRN

## 2018-03-18 NOTE — Progress Notes (Signed)
The patient is following up after having an intra-articular injection in her left shoulder glenohumeral joint due to severe arthritis.  She said that injection has helped some but most of her pain seems to be parascapular now.  She points to the thoracic spine area in the back of the shoulder is severe pain to her.  She still has a lot of grinding in her shoulder.  She is an active 82 year old female.  She cannot take narcotic pain medication or anti-inflammatories.  She cannot take oral prednisone.  She has been on Neurontin at night and that has helped some.  On exam there is still grinding of the glenohumeral joint and she understands this is chronic.  She does have parascapular pain along the serratus and rhomboid area of the medial border of the scapula.  I feel would be appropriate to send her to outpatient physical therapy to even consider TENS unit to help with this area.  They can try iontophoresis and phonophoresis as well.  I will send in some Robaxin to see if this will help calm the muscles down.  All questions concerns were answered and addressed.  We will see her back in about 6 weeks to see if therapy has helped.  I do feel comfortable with potentially trigger point injection along this area at that point if needed.

## 2018-03-19 ENCOUNTER — Encounter (INDEPENDENT_AMBULATORY_CARE_PROVIDER_SITE_OTHER): Payer: Self-pay | Admitting: Orthopaedic Surgery

## 2018-03-19 ENCOUNTER — Other Ambulatory Visit (INDEPENDENT_AMBULATORY_CARE_PROVIDER_SITE_OTHER): Payer: Self-pay | Admitting: Orthopaedic Surgery

## 2018-03-19 MED ORDER — METHYLPREDNISOLONE 4 MG PO TABS: ORAL_TABLET | ORAL | 0 refills | Status: DC

## 2018-03-20 DIAGNOSIS — M542 Cervicalgia: Secondary | ICD-10-CM | POA: Diagnosis not present

## 2018-03-20 DIAGNOSIS — M25512 Pain in left shoulder: Secondary | ICD-10-CM | POA: Diagnosis not present

## 2018-03-24 ENCOUNTER — Ambulatory Visit: Payer: Medicare Other | Admitting: Physician Assistant

## 2018-03-29 ENCOUNTER — Other Ambulatory Visit (INDEPENDENT_AMBULATORY_CARE_PROVIDER_SITE_OTHER): Payer: Self-pay | Admitting: Orthopaedic Surgery

## 2018-03-30 DIAGNOSIS — T8579XD Infection and inflammatory reaction due to other internal prosthetic devices, implants and grafts, subsequent encounter: Secondary | ICD-10-CM | POA: Diagnosis not present

## 2018-03-30 DIAGNOSIS — B9562 Methicillin resistant Staphylococcus aureus infection as the cause of diseases classified elsewhere: Secondary | ICD-10-CM | POA: Diagnosis not present

## 2018-03-30 NOTE — Telephone Encounter (Signed)
LMOM notifying patient.

## 2018-03-30 NOTE — Telephone Encounter (Signed)
Ok to refill 

## 2018-04-20 DIAGNOSIS — J40 Bronchitis, not specified as acute or chronic: Secondary | ICD-10-CM | POA: Diagnosis not present

## 2018-04-28 ENCOUNTER — Other Ambulatory Visit: Payer: Self-pay | Admitting: Primary Care

## 2018-04-28 DIAGNOSIS — K219 Gastro-esophageal reflux disease without esophagitis: Secondary | ICD-10-CM

## 2018-04-29 ENCOUNTER — Ambulatory Visit (INDEPENDENT_AMBULATORY_CARE_PROVIDER_SITE_OTHER): Payer: Medicare Other | Admitting: Orthopaedic Surgery

## 2018-04-29 ENCOUNTER — Encounter (INDEPENDENT_AMBULATORY_CARE_PROVIDER_SITE_OTHER): Payer: Self-pay | Admitting: Orthopaedic Surgery

## 2018-04-29 DIAGNOSIS — M1711 Unilateral primary osteoarthritis, right knee: Secondary | ICD-10-CM | POA: Diagnosis not present

## 2018-04-29 DIAGNOSIS — G8929 Other chronic pain: Secondary | ICD-10-CM | POA: Diagnosis not present

## 2018-04-29 DIAGNOSIS — M25512 Pain in left shoulder: Secondary | ICD-10-CM | POA: Diagnosis not present

## 2018-04-29 DIAGNOSIS — M1712 Unilateral primary osteoarthritis, left knee: Secondary | ICD-10-CM | POA: Diagnosis not present

## 2018-04-29 MED ORDER — GABAPENTIN 100 MG PO CAPS: ORAL_CAPSULE | ORAL | 0 refills | Status: DC

## 2018-04-29 MED ORDER — METHYLPREDNISOLONE 4 MG PO TABS: ORAL_TABLET | ORAL | 0 refills | Status: DC

## 2018-04-29 NOTE — Progress Notes (Signed)
The patient is well-known to me.  She is 83 year old female with bilateral knee osteoarthritis and left shoulder osteoarthritis.  She also has had upper back and lower back pain.  A steroid taper did help her quite a bit.  She went to one physical therapy session and she states that was not helpful and it is too expensive.  She cannot take narcotic pain medications or anti-inflammatories.  Right now she feels stable in terms of her baseline.  She does have some grinding at the left shoulder and both knees on exam.  There is no significant effusion of either joint in terms of her knees or her shoulder.  She is requesting [place] to continue her gabapentin because that helps into at least have another steroid taper to have on hand when she needs it.  I agree with all this.  She has had hyaluronic acid injections in her knees before.  It is too early to do that.  We will see her back in 3 months to see how she is doing overall.  At that visit we can always place steroid injections in her knees and then order hyaluronic acid.  I will send in the medications for her we will see her back in 3 months.  All questions and concerns were answered and addressed.

## 2018-05-11 ENCOUNTER — Ambulatory Visit (INDEPENDENT_AMBULATORY_CARE_PROVIDER_SITE_OTHER): Payer: Medicare Other | Admitting: Orthopaedic Surgery

## 2018-05-12 NOTE — Progress Notes (Signed)
Office Visit Note  Patient: [name] [name]             Date of Birth: June 11, 1935           MRN: [id number]             PCP: [name], NP Referring: [name], NP Visit Date: 05/26/2018 Occupation: @GUAROCC @  Subjective:  Bilateral knee joint pain   History of Present Illness: [name] [name] is a 83 y.o. female with history of osteoarthritis, DDD, and fibromyalgia.  She takes Gabapentin 100 mg 1 capsule TID and applies pennsaid topically PRN. She presents today with chronic left shoulder and bilateral knee joint pain.  She reports she had a cortisone injection performed by Dr. Ernestina Patches on 02/09/18 that initially provided significant relief, but the pain has returned.  She has limited ROM and pain at night.   She reports she was evaluated by Dr. Ninfa Linden for bilateral knee joint pain.  She discussed surgery but she is still apprehensive due to her history of MRSA s/p lumbar surgery.  She reports she had gel injections, cortisone injections, and knee joint aspirations performed by Dr. Ninfa Linden for the past few months. She continues to have chronic pain and difficulty going up and down steps.  Her right knee stays warm and swollen. She uses pennsaid topically. She has chronic pain in both hands.  She has had stiffness that make it difficulty to perform some activities.  She has chronic neck and lower back pain.  She has generalized muscle tenderness.  She has trapezius muscle tenderness.  She has chronic fatigue.  She has interrupted sleep at night. She sleeps in the recliner most of the night due to left shoulder pain.    Activities of Daily Living:  Patient reports morning stiffness for 10 minutes.   Patient Reports nocturnal pain.  Difficulty dressing/grooming: Reports Difficulty climbing stairs: Denies Difficulty getting out of chair: Reports Difficulty using hands for taps, buttons, cutlery, and/or writing: Reports  Review of Systems  Constitutional: Positive for fatigue.    HENT: Negative for mouth sores, mouth dryness and nose dryness.   Eyes: Negative for pain, visual disturbance and dryness.  Respiratory: Negative for cough, hemoptysis, shortness of breath and difficulty breathing.   Cardiovascular: Positive for swelling in legs/feet. Negative for chest pain, palpitations and hypertension.  Gastrointestinal: Negative for blood in stool, constipation and diarrhea.  Endocrine: Negative for excessive thirst and increased urination.  Genitourinary: Negative for painful urination.  Musculoskeletal: Positive for arthralgias, gait problem, joint pain, joint swelling, muscle weakness, morning stiffness and muscle tenderness. Negative for myalgias and myalgias.  Skin: Negative for color change, pallor, rash, hair loss, nodules/bumps, skin tightness, ulcers and sensitivity to sunlight.  Allergic/Immunologic: Negative for susceptible to infections.  Neurological: Negative for dizziness and headaches.  Hematological: Negative for bruising/bleeding tendency and swollen glands.  Psychiatric/Behavioral: Positive for sleep disturbance. Negative for depressed mood. The patient is not nervous/anxious.     PMFS History:  Patient Active Problem List   Diagnosis Date Noted  . 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 Chronic left shoulder pain 01/21/2018  . Chronic pain of left knee 01/21/2018  . Chronic pain of right knee 01/21/2018  . Osteoarthritis of glenohumeral joint, left 01/21/2018  . Decreased renal function 01/29/2017  . Anemia 01/29/2017  . Preventative health care 01/29/2017  . Other fatigue 09/03/2016  . Primary insomnia 09/03/2016  . Primary osteoarthritis of both knees 09/03/2016  . DJD (degenerative joint disease), cervical  09/03/2016  . Osteopenia 03/04/2016  . Palpitations 09/19/2014  . Thyroid nodule, uninodular 09/18/2011  . Hypothyroid 08/19/2011  . Postmenopausal HRT (hormone replacement therapy) 08/13/2011  . Mitral regurgitation   . Lumbar spine  pain   . Dyslipidemia   . Chest pain   . Statin intolerance   . Mitral valve prolapse   . Ejection fraction   . Fibromyalgia   . ADENOMATOUS COLONIC POLYP 08/15/2007  . GERD 08/15/2007  . BARRETTS ESOPHAGUS 08/15/2007  . HIATAL HERNIA 08/15/2007  . Osteoarthritis 08/15/2007  . CATARACT EXTRACTION, HX OF 08/15/2007    Past Medical History:  Diagnosis Date  . Anxiety    prev on prozac  . Chest pain    Felt to be noncardiac in the past  . Chronic rectal fissure   . Colon polyp   . Dyslipidemia   . Ejection fraction    EF 60%, echo, 2007, atrial septum bows left to right compatible with increased left atrial pressure  . Fibromyalgia   . GERD (gastroesophageal reflux disease)    Barrett's esophagus  . Hiatal hernia   . Lumbar spine pain    Complex lumbar spine surgery at [facility], 0347, complicated, MRSA, much of the appliance is removed, patient on chronic antibiotics  . Melanoma ([place])   . Mitral regurgitation    Mild, 2007,  /  moderate or severe echo, September, 2012, eccentric jet wrapping the left atrium, consider TEE for further assessment  . Mitral valve prolapse    Mild mitral regurgitation echo, 2007  . Osteoarthritis   . Scoliosis   . Sinusitis    Multiple sinus operations in the past  . Statin intolerance    As of 2009, no further attempts to use statins  . Thyroid nodule     Family History  Problem Relation Age of Onset  . GER disease Mother   . Dementia Mother   . Breast cancer Mother   . Heart disease Father        MI at 22  . Scoliosis Daughter   . Colon cancer Neg Hx    Past Surgical History:  Procedure Laterality Date  . ABDOMINAL HYSTERECTOMY    . BACK SURGERY    . CHOLECYSTECTOMY    . FOOT SURGERY    . HERNIA REPAIR    . NASAL SINUS SURGERY     Social History   Social History Narrative   From [place]   Married (second [phone]   3 kids, 2 of whom are local   Retired, Armed forces technical officer History  Administered Date(s)  Administered  . Influenza Split 02/03/2012  . Influenza,inj,Quad PF,6+ Mos 02/20/2017, 02/03/2018  . Influenza-Unspecified 02/02/2016, 01/13/2017  . Pneumococcal Polysaccharide-23 02/03/2018  . Td 02/03/2016  . Tdap 10/22/2017     Objective: Vital Signs: BP (!) 104/53 (BP Location: Left Arm, Patient Position: Sitting, Cuff Size: Normal)   Pulse 71   Resp 18   Ht 4\' 9"  (1.448 m)   Wt 147 lb 4.8 oz (66.8 kg)   BMI 31.88 kg/m    Physical Exam Vitals signs and nursing note reviewed.  Constitutional:      Appearance: She is well-developed.  HENT:     Head: Normocephalic and atraumatic.  Eyes:     Conjunctiva/sclera: Conjunctivae normal.  Neck:     Musculoskeletal: Normal range of motion.  Cardiovascular:     Rate and Rhythm: Normal rate and regular rhythm.     Heart sounds: Normal heart  sounds.  Pulmonary:     Effort: Pulmonary effort is normal.     Breath sounds: Normal breath sounds.  Abdominal:     General: Bowel sounds are normal.     Palpations: Abdomen is soft.  Lymphadenopathy:     Cervical: No cervical adenopathy.  Skin:    General: Skin is warm and dry.     Capillary Refill: Capillary refill takes less than 2 seconds.  Neurological:     Mental Status: She is alert and oriented to person, place, and time.  Psychiatric:        Behavior: Behavior normal.      Musculoskeletal Exam: C-spine, thoracic spine, and lumbar spine limited ROM with discomfort.  Lumbar scoliosis. left shoulder painful ROM. Elbow joints good ROM.  Slightly limited ROM of both wrist joints. Severe PIP and DIP synovial thickening with subluxation and inflammation in several PIP joints. Severe CMC joint synovial thickening.  Hip joints good ROM.  Knee joints good ROM.  Right knee warmth and swelling noted.  Left knee no warmth or effusion. Bilateral knee joint crepitus. No tenderness of ankle joints.  Pedal edema bilaterally.  Generalized muscle tenderness.   CDAI Exam: CDAI Score: Not  documented Patient Global Assessment: Not documented; Provider Global Assessment: Not documented Swollen: Not documented; Tender: Not documented Joint Exam   Not documented   There is currently no information documented on the homunculus. Go to the Rheumatology activity and complete the homunculus joint exam.  Investigation: No additional findings.  Imaging: No results found.  Recent Labs: Lab Results  Component Value Date   WBC 6.1 01/29/2018   HGB 12.0 01/29/2018   PLT 251.0 01/29/2018   NA 140 01/29/2018   K 4.4 01/29/2018   CL 101 01/29/2018   CO2 29 01/29/2018   GLUCOSE 100 (H) 01/29/2018   BUN 25 (H) 01/29/2018   CREATININE 1.15 01/29/2018   BILITOT 0.3 01/29/2018   ALKPHOS 56 01/29/2018   AST 14 01/29/2018   ALT 13 01/29/2018   PROT 7.1 01/29/2018   ALBUMIN 4.3 01/29/2018   CALCIUM 9.6 01/29/2018    Speciality Comments: No specialty comments available.  Procedures:  No procedures performed Allergies: Other; Fenofibric acid; Nsaids; Statins; Trilipix [choline fenofibrate]; and Wasp venom   Assessment / Plan:     Visit Diagnoses: Primary osteoarthritis of both hands: She has severe PIP and DIP synovial thickening and subluxation.  She has inflammatory changes of several PIP joints.  She has bilateral CMC joint synovial thickening and tenderness.  Joint protection and muscle strengthening were discussed.   Primary osteoarthritis of left shoulder: She has limited ROM with discomfort.  She had a left shoulder cortisone injection performed by Dr. Ernestina Patches on 02/09/18.  She has pain at night and has to sleep in a recliner. She initially had significant relief but the pain has returned.  She uses Pennsaid topically PRN.    Primary osteoarthritis of both knees - moderate osteoarthritis and moderate chondromalacia patella: Chronic pain.  She has warmth and swelling of the right knee joint but no effusion.  Left knee has good ROM with no warmth or effusion.  She has  bilateral knee crepitus.  She was evaluated by Dr. Ninfa Linden who performed an aspiration and cortisone injection followed by gel injections.  She will be following up with Dr. Ninfa Linden PRN.  She does not want to proceed with surgery at this time. She will continue using Pennsaid topically. A refill was sent to the pharmacy.   DDD (  degenerative disc disease), cervical: She has limited ROM with discomfort.  She has trapezius muscle tenderness and muscle tension bilaterally.  She has no symptoms of radiculopathy.   DDD (degenerative disc disease), lumbar - S/p fusion T11-S1: She has chronic pain and limited ROM.   Fibromyalgia: She has generalized muscle tenderness.    Primary insomnia: She has interrupted sleep at night due to the discomfort she experiences.  She sleeps for about 2 hours in the bed and then switches to the recliner due to left shoulder pain.   Other fatigue: She has chronic fatigue that has been stable.   Osteopenia of multiple sites - T score -2.3 09/2015. T-score: -2.2 09/2017.  She takes a calcium and vitamin D supplement. We discussed resistive exercises.  She goes to the gym for exercise.   Other medical conditions are listed as follows:   History of gastroesophageal reflux (GERD)  History of Barrett's esophagus  History of mitral valve prolapse  History of hypothyroidism  History of anemia  History of hiatal hernia  History of hyperlipidemia   Orders: No orders of the defined types were placed in this encounter.  Meds ordered this encounter  Medications  . Diclofenac Sodium (PENNSAID) 2 % SOLN    Sig: PLACE 2 PUMP ONTO THE SKIN 2 (TWO) TIMES DAILY.    Dispense:  112 g    Refill:  1      Follow-Up Instructions: Return in about 6 months (around 11/24/2018) for Osteoarthritis.   Ofilia Neas, PA-C  Note - This record has been created using Dragon software.  Chart creation errors have been sought, but may not always  have been located. Such creation  errors do not reflect on  the standard of medical care.

## 2018-05-14 DIAGNOSIS — D1722 Benign lipomatous neoplasm of skin and subcutaneous tissue of left arm: Secondary | ICD-10-CM | POA: Diagnosis not present

## 2018-05-14 DIAGNOSIS — D225 Melanocytic nevi of trunk: Secondary | ICD-10-CM | POA: Diagnosis not present

## 2018-05-14 DIAGNOSIS — D2261 Melanocytic nevi of right upper limb, including shoulder: Secondary | ICD-10-CM | POA: Diagnosis not present

## 2018-05-14 DIAGNOSIS — L821 Other seborrheic keratosis: Secondary | ICD-10-CM | POA: Diagnosis not present

## 2018-05-26 ENCOUNTER — Ambulatory Visit (INDEPENDENT_AMBULATORY_CARE_PROVIDER_SITE_OTHER): Payer: Medicare Other | Admitting: Physician Assistant

## 2018-05-26 ENCOUNTER — Other Ambulatory Visit: Payer: Self-pay | Admitting: Physician Assistant

## 2018-05-26 ENCOUNTER — Encounter: Payer: Self-pay | Admitting: Physician Assistant

## 2018-05-26 VITALS — BP 104/53 | HR 71 | Resp 18 | Ht <= 58 in | Wt 147.3 lb

## 2018-05-26 DIAGNOSIS — M5136 Other intervertebral disc degeneration, lumbar region: Secondary | ICD-10-CM

## 2018-05-26 DIAGNOSIS — M170 Bilateral primary osteoarthritis of knee: Secondary | ICD-10-CM

## 2018-05-26 DIAGNOSIS — R5383 Other fatigue: Secondary | ICD-10-CM

## 2018-05-26 DIAGNOSIS — M19041 Primary osteoarthritis, right hand: Secondary | ICD-10-CM

## 2018-05-26 DIAGNOSIS — Z8639 Personal history of other endocrine, nutritional and metabolic disease: Secondary | ICD-10-CM

## 2018-05-26 DIAGNOSIS — Z8679 Personal history of other diseases of the circulatory system: Secondary | ICD-10-CM

## 2018-05-26 DIAGNOSIS — M8589 Other specified disorders of bone density and structure, multiple sites: Secondary | ICD-10-CM

## 2018-05-26 DIAGNOSIS — M19042 Primary osteoarthritis, left hand: Secondary | ICD-10-CM

## 2018-05-26 DIAGNOSIS — M797 Fibromyalgia: Secondary | ICD-10-CM

## 2018-05-26 DIAGNOSIS — M5030 Other cervical disc degeneration, unspecified cervical region: Secondary | ICD-10-CM

## 2018-05-26 DIAGNOSIS — M19012 Primary osteoarthritis, left shoulder: Secondary | ICD-10-CM

## 2018-05-26 DIAGNOSIS — F5101 Primary insomnia: Secondary | ICD-10-CM

## 2018-05-26 DIAGNOSIS — Z862 Personal history of diseases of the blood and blood-forming organs and certain disorders involving the immune mechanism: Secondary | ICD-10-CM

## 2018-05-26 DIAGNOSIS — Z8719 Personal history of other diseases of the digestive system: Secondary | ICD-10-CM

## 2018-05-26 MED ORDER — DICLOFENAC SODIUM 2 % TD SOLN: TRANSDERMAL | 1 refills | Status: DC

## 2018-05-27 ENCOUNTER — Other Ambulatory Visit: Payer: Self-pay | Admitting: Primary Care

## 2018-05-27 ENCOUNTER — Other Ambulatory Visit: Payer: Self-pay | Admitting: Physician Assistant

## 2018-05-27 DIAGNOSIS — Z1231 Encounter for screening mammogram for malignant neoplasm of breast: Secondary | ICD-10-CM

## 2018-05-27 MED ORDER — DICLOFENAC SODIUM 1 % TD GEL
TRANSDERMAL | 2 refills | Status: AC
Start: 2018-05-27 — End: ?

## 2018-05-27 NOTE — Telephone Encounter (Signed)
Please change prescription to diclofenac gel.

## 2018-05-28 ENCOUNTER — Other Ambulatory Visit: Payer: Self-pay | Admitting: Primary Care

## 2018-05-28 DIAGNOSIS — E785 Hyperlipidemia, unspecified: Secondary | ICD-10-CM

## 2018-06-09 ENCOUNTER — Telehealth: Payer: Self-pay | Admitting: Rheumatology

## 2018-06-09 NOTE — Telephone Encounter (Signed)
Patient has had an inadequate response to diclofenac gel and had an intolerance to oral antiinflammatories. Needs a PA for Pennsaid.   Received a Prior Authorization request from [facility] for Pennsaid. Authorization has been submitted to patient's insurance via Cover My Meds. Will update once we receive a response.

## 2018-06-09 NOTE — Telephone Encounter (Signed)
[facility] called stating [facility] needs [facility] Estanislado Pandy to "fill out a [facility] for [facility]" so her prescription of Pennsaid can be filled.  Patient states [facility] told her they faxed over the form for approval.   Patient states she does not want the Voltaren Gel because it doesn't work as well.

## 2018-06-10 ENCOUNTER — Encounter: Payer: Self-pay | Admitting: *Deleted

## 2018-06-10 NOTE — Telephone Encounter (Signed)
Received a fax regarding Prior Authorization from [facility] for Pennsaid. Authorization has been DENIED.   Will send document to scan center.

## 2018-06-21 ENCOUNTER — Other Ambulatory Visit (INDEPENDENT_AMBULATORY_CARE_PROVIDER_SITE_OTHER): Payer: Self-pay | Admitting: Orthopaedic Surgery

## 2018-06-22 NOTE — Telephone Encounter (Signed)
Please advise 

## 2018-06-23 ENCOUNTER — Ambulatory Visit: Payer: Medicare Other

## 2018-06-30 ENCOUNTER — Other Ambulatory Visit: Payer: Self-pay

## 2018-06-30 MED ORDER — METOPROLOL TARTRATE 50 MG PO TABS: ORAL_TABLET | ORAL | 3 refills | Status: DC

## 2018-06-30 NOTE — Telephone Encounter (Signed)
*  STAT* If patient is at the pharmacy, call can be transferred to refill team.   1. Which medications need to be refilled? (please list name of each medication and dose if known) Metoprolol  2. Which pharmacy/location (including street and city if local pharmacy) is medication to be sent to? [facility] [place]  3. Do they need a 30 day or 90 day supply? [place]

## 2018-07-06 ENCOUNTER — Telehealth: Payer: Self-pay | Admitting: Rheumatology

## 2018-07-06 NOTE — Telephone Encounter (Signed)
Patient calling because she would like a rx for Voltaren Gel 3% sent into pharmacy[facility] [place] instead of [facility] Per patient Pennsaid will cost $1781.00. Patient cannot afford this. Please call to advise.

## 2018-07-07 NOTE — Telephone Encounter (Signed)
Spoke with patient and advised she should be able to get the prescription for approximately [facility] with [facility]. Patient will contact the pharmacy and call the office if that does not work.

## 2018-07-13 ENCOUNTER — Ambulatory Visit: Payer: Medicare Other

## 2018-07-27 ENCOUNTER — Telehealth (INDEPENDENT_AMBULATORY_CARE_PROVIDER_SITE_OTHER): Payer: Self-pay | Admitting: Radiology

## 2018-07-27 NOTE — Telephone Encounter (Signed)
Called and spoke with patient, patient answered NO to all pre screening questions.  

## 2018-07-28 ENCOUNTER — Ambulatory Visit (INDEPENDENT_AMBULATORY_CARE_PROVIDER_SITE_OTHER): Payer: Medicare Other | Admitting: Orthopaedic Surgery

## 2018-07-28 ENCOUNTER — Encounter (INDEPENDENT_AMBULATORY_CARE_PROVIDER_SITE_OTHER): Payer: Self-pay | Admitting: Orthopaedic Surgery

## 2018-07-28 ENCOUNTER — Ambulatory Visit (INDEPENDENT_AMBULATORY_CARE_PROVIDER_SITE_OTHER): Payer: Self-pay

## 2018-07-28 ENCOUNTER — Telehealth (INDEPENDENT_AMBULATORY_CARE_PROVIDER_SITE_OTHER): Payer: Self-pay

## 2018-07-28 ENCOUNTER — Other Ambulatory Visit (INDEPENDENT_AMBULATORY_CARE_PROVIDER_SITE_OTHER): Payer: Self-pay

## 2018-07-28 ENCOUNTER — Other Ambulatory Visit: Payer: Self-pay

## 2018-07-28 DIAGNOSIS — M19012 Primary osteoarthritis, left shoulder: Secondary | ICD-10-CM | POA: Diagnosis not present

## 2018-07-28 DIAGNOSIS — M25511 Pain in right shoulder: Secondary | ICD-10-CM | POA: Diagnosis not present

## 2018-07-28 DIAGNOSIS — M1712 Unilateral primary osteoarthritis, left knee: Secondary | ICD-10-CM

## 2018-07-28 DIAGNOSIS — M1711 Unilateral primary osteoarthritis, right knee: Secondary | ICD-10-CM

## 2018-07-28 MED ORDER — LIDOCAINE HCL 1 % IJ SOLN
3.0000 mL | INTRAMUSCULAR | Status: AC | PRN
  Administered 2018-07-28: 10:00:00 3 mL

## 2018-07-28 MED ORDER — LIDOCAINE HCL 1 % IJ SOLN
3.0000 mL | INTRAMUSCULAR | Status: AC | PRN
  Administered 2018-07-28: 3 mL

## 2018-07-28 MED ORDER — METHYLPREDNISOLONE ACETATE 40 MG/ML IJ SUSP
40.0000 mg | INTRAMUSCULAR | Status: AC | PRN
  Administered 2018-07-28: 40 mg via INTRA_ARTICULAR

## 2018-07-28 NOTE — Telephone Encounter (Signed)
Bilateral gel injections  

## 2018-07-28 NOTE — Progress Notes (Signed)
Office Visit Note   Patient: [name] [name]           Date of Birth: 1935/09/24           MRN: [id number] Visit Date: 07/28/2018              Requested by: [name], NP [place], [place] [address] PCP: [name], NP   Assessment & Plan: Visit Diagnoses:  1. Right shoulder pain, unspecified chronicity   2. Unilateral primary osteoarthritis, right knee   3. Osteoarthritis of glenohumeral joint, left   4. Unilateral primary osteoarthritis, left knee     Plan: I did provide a steroid injection in both her knees today.  I talked to her about her shoulders in detail.  We would not inject her shoulder since that does not really help her.  She has had hyaluronic acid before for her knees and that is helped her quite a bit.  She is requesting these again I think is absolutely reasonable because this helped keep her out of surgery and keep her mobile.  We will see her back in a month to hopefully provide hyaluronic acid injections for both her knees.  Follow-Up Instructions: Return in about 4 weeks (around 08/25/2018).   Orders:  Orders Placed This Encounter  Procedures  . Large Joint Inj  . Large Joint Inj  . XR Shoulder Right   No orders of the defined types were placed in this encounter.     Procedures: Large Joint Inj: R knee on 07/28/2018 9:49 AM Indications: diagnostic evaluation and pain Details: 22 G 1.5 in needle, superolateral approach  Arthrogram: No  Medications: 3 mL lidocaine 1 %; 40 mg methylPREDNISolone acetate 40 MG/ML Outcome: tolerated well, no immediate complications Procedure, treatment alternatives, risks and benefits explained, specific risks discussed. Consent was given by the patient. Immediately prior to procedure a time out was called to verify the correct patient, procedure, equipment, support staff and site/side marked as required. Patient was prepped and draped in the usual sterile fashion.   Large Joint Inj: L knee on  07/28/2018 9:49 AM Indications: diagnostic evaluation and pain Details: 22 G 1.5 in needle, superolateral approach  Arthrogram: No  Medications: 3 mL lidocaine 1 %; 40 mg methylPREDNISolone acetate 40 MG/ML Outcome: tolerated well, no immediate complications Procedure, treatment alternatives, risks and benefits explained, specific risks discussed. Consent was given by the patient. Immediately prior to procedure a time out was called to verify the correct patient, procedure, equipment, support staff and site/side marked as required. Patient was prepped and draped in the usual sterile fashion.       Clinical Data: No additional findings.   Subjective: Chief Complaint  Patient presents with  . Left Knee - Follow-up  . Right Knee - Follow-up  . Left Shoulder - Follow-up  Patient is well-known to me.  She is 83 year old active female with bilateral knee well-documented osteoarthritis and left shoulder glenohumeral arthritis.  She does come in today hoping to have x-rays of her right shoulder because is been bothering her for a while.  She did have a proximal humerus fracture after mechanical fall in 2012 on the right shoulder.  She has problems lifting her arm above her head.  Injections in her left shoulder only helped for about 2 weeks.  She is requesting bilateral knee steroid injections today and repeat hyaluronic acid injections about a month because that is helped her maintain her mobility and decrease her  pain as well as try and avoid surgery.  She is had no other acute changes in her medical status.  HPI  Review of Systems She currently denies any headache, chest pain, shortness of breath, fever, chills, nausea, vomiting.  Objective: Vital Signs: There were no vitals taken for this visit.  Physical Exam She is alert and orient x3 and in no acute distress Ortho Exam Examination of her right shoulder shows obvious disease of the rotator cuff with the inability to fully abduct and  overhead reach with her right shoulder.  She is her deltoid is more to lift her shoulder.  Both knees have global medial and lateral joint line tenderness with good range of motion.  Her left shoulder has deficits in range of motion as well.  Both knees show patellofemoral crepitation.  Both knees are ligamentously stable with a slight effusion. Specialty Comments:  No specialty comments available.  Imaging: Xr Shoulder Right  Result Date: 07/28/2018 3 views of the right shoulder show evidence of rotator cuff arthropathy and a old healed proximal humerus fracture.  There is significant glenohumeral arthritic changes.    PMFS History: Patient Active Problem List   Diagnosis Date Noted  . 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 Chronic left shoulder pain 01/21/2018  . Chronic pain of left knee 01/21/2018  . Chronic pain of right knee 01/21/2018  . Osteoarthritis of glenohumeral joint, left 01/21/2018  . Decreased renal function 01/29/2017  . Anemia 01/29/2017  . Preventative health care 01/29/2017  . Other fatigue 09/03/2016  . Primary insomnia 09/03/2016  . Primary osteoarthritis of both knees 09/03/2016  . DJD (degenerative joint disease), cervical 09/03/2016  . Osteopenia 03/04/2016  . Palpitations 09/19/2014  . Thyroid nodule, uninodular 09/18/2011  . Hypothyroid 08/19/2011  . Postmenopausal HRT (hormone replacement therapy) 08/13/2011  . Mitral regurgitation   . Lumbar spine pain   . Dyslipidemia   . Chest pain   . Statin intolerance   . Mitral valve prolapse   . Ejection fraction   . Fibromyalgia   . ADENOMATOUS COLONIC POLYP 08/15/2007  . GERD 08/15/2007  . BARRETTS ESOPHAGUS 08/15/2007  . HIATAL HERNIA 08/15/2007  . Osteoarthritis 08/15/2007  . CATARACT EXTRACTION, HX OF 08/15/2007   Past Medical History:  Diagnosis Date  . Anxiety    prev on prozac  . Chest pain    Felt to be noncardiac in the past  . Chronic rectal fissure   . Colon polyp   . Dyslipidemia    . Ejection fraction    EF 60%, echo, 2007, atrial septum bows left to right compatible with increased left atrial pressure  . Fibromyalgia   . GERD (gastroesophageal reflux disease)    Barrett's esophagus  . Hiatal hernia   . Lumbar spine pain    Complex lumbar spine surgery at [facility], 1696, complicated, MRSA, much of the appliance is removed, patient on chronic antibiotics  . Melanoma ([place])   . Mitral regurgitation    Mild, 2007,  /  moderate or severe echo, September, 2012, eccentric jet wrapping the left atrium, consider TEE for further assessment  . Mitral valve prolapse    Mild mitral regurgitation echo, 2007  . Osteoarthritis   . Scoliosis   . Sinusitis    Multiple sinus operations in the past  . Statin intolerance    As of 2009, no further attempts to use statins  . Thyroid nodule     Family History  Problem Relation Age of Onset  . GER  disease Mother   . Dementia Mother   . Breast cancer Mother   . Heart disease Father        MI at 38  . Scoliosis Daughter   . Colon cancer Neg Hx     Past Surgical History:  Procedure Laterality Date  . ABDOMINAL HYSTERECTOMY    . BACK SURGERY    . CHOLECYSTECTOMY    . FOOT SURGERY    . HERNIA REPAIR    . NASAL SINUS SURGERY     Social History   Occupational History  . Not on file  Tobacco Use  . Smoking status: Never Smoker  . Smokeless tobacco: Never Used  Substance and Sexual Activity  . Alcohol use: No  . Drug use: No  . Sexual activity: Not on file

## 2018-08-03 ENCOUNTER — Ambulatory Visit: Payer: Medicare Other | Admitting: Physician Assistant

## 2018-08-03 NOTE — Telephone Encounter (Signed)
Noted  

## 2018-08-05 ENCOUNTER — Telehealth (INDEPENDENT_AMBULATORY_CARE_PROVIDER_SITE_OTHER): Payer: Self-pay

## 2018-08-05 NOTE — Telephone Encounter (Signed)
Submitted VOB for SynviscOne, bilateral knee. 

## 2018-08-11 ENCOUNTER — Ambulatory Visit: Payer: Medicare Other

## 2018-08-11 ENCOUNTER — Telehealth (INDEPENDENT_AMBULATORY_CARE_PROVIDER_SITE_OTHER): Payer: Self-pay

## 2018-08-11 NOTE — Telephone Encounter (Signed)
Patient is approved for SynviscOne, bilateral knee. Buy & Bill Covered at 100% through her insurance. Co-pay of $94.00 is required PA required PA Approval# [id number] Valid 02/06/2018- 02/06/2019  Appt. 08/26/2018 with Dr. Ninfa Linden

## 2018-08-14 ENCOUNTER — Other Ambulatory Visit (INDEPENDENT_AMBULATORY_CARE_PROVIDER_SITE_OTHER): Payer: Self-pay | Admitting: Orthopaedic Surgery

## 2018-08-14 NOTE — Telephone Encounter (Signed)
Please advise 

## 2018-08-22 ENCOUNTER — Other Ambulatory Visit: Payer: Self-pay | Admitting: Primary Care

## 2018-08-22 DIAGNOSIS — E039 Hypothyroidism, unspecified: Secondary | ICD-10-CM

## 2018-08-26 ENCOUNTER — Ambulatory Visit (INDEPENDENT_AMBULATORY_CARE_PROVIDER_SITE_OTHER): Payer: Medicare Other | Admitting: Orthopaedic Surgery

## 2018-08-26 ENCOUNTER — Other Ambulatory Visit: Payer: Self-pay

## 2018-08-26 ENCOUNTER — Telehealth (INDEPENDENT_AMBULATORY_CARE_PROVIDER_SITE_OTHER): Payer: Self-pay

## 2018-08-26 ENCOUNTER — Encounter: Payer: Self-pay | Admitting: Orthopaedic Surgery

## 2018-08-26 DIAGNOSIS — M1712 Unilateral primary osteoarthritis, left knee: Secondary | ICD-10-CM | POA: Diagnosis not present

## 2018-08-26 DIAGNOSIS — M1711 Unilateral primary osteoarthritis, right knee: Secondary | ICD-10-CM | POA: Diagnosis not present

## 2018-08-26 MED ORDER — HYLAN G-F 20 48 MG/6ML IX SOSY
48.0000 mg | PREFILLED_SYRINGE | INTRA_ARTICULAR | Status: AC | PRN
  Administered 2018-08-26: 48 mg via INTRA_ARTICULAR

## 2018-08-26 MED ORDER — LIDOCAINE HCL 1 % IJ SOLN
0.5000 mL | INTRAMUSCULAR | Status: AC | PRN
  Administered 2018-08-26: .5 mL

## 2018-08-26 NOTE — Progress Notes (Signed)
Office Visit Note   Patient: [name] [name]           Date of Birth: April 24, 1935           MRN: [id number] Visit Date: 08/26/2018              Requested by: [name], NP [place], [place] [address] PCP: [name], NP   Assessment & Plan: Visit Diagnoses:  1. Unilateral primary osteoarthritis, right knee   2. Unilateral primary osteoarthritis, left knee     Plan: She will continue to work on quad strengthening both knees.  She requested come back in 6 months to have repeat injections both knees.  She will let us know if she wants a steroid injection into her right shoulder this with the intra-articular.  Questions encouraged and answered.  Follow-Up Instructions: Return in about 6 months (around 02/26/2019) for Supplemental injection.   Orders:  No orders of the defined types were placed in this encounter.  No orders of the defined types were placed in this encounter.     Procedures: Large Joint Inj: bilateral knee on 08/26/2018 9:28 AM Indications: pain Details: 22 G 1.5 in needle, anterolateral approach  Arthrogram: No  Medications (Right): 0.5 mL lidocaine 1 %; 48 mg Hylan 48 MG/6ML Medications (Left): 0.5 mL lidocaine 1 %; 48 mg Hylan 48 MG/6ML Outcome: tolerated well, no immediate complications Procedure, treatment alternatives, risks and benefits explained, specific risks discussed. Consent was given by the patient. Immediately prior to procedure a time out was called to verify the correct patient, procedure, equipment, support staff and site/side marked as required. Patient was prepped and draped in the usual sterile fashion.       Clinical Data: No additional findings.   Subjective: Chief Complaint  Patient presents with  . Left Knee - Follow-up  . Right Knee - Follow-up    HPI Mrs. Hoose comes in today for bilateral Synvisc 1 injections.  She said no injury to either knee.  She does state that her right knee bothers her more  than the left.  She is also complaining of bilateral shoulder pain.  She is significant arthritis of the right shoulder and end-stage arthritis of the left shoulder.  She is had intra-articular injection left shoulder in the past that did not help.  Review of Systems Denies any fevers or chills.  Objective: Vital Signs: There were no vitals taken for this visit.  Physical Exam Constitutional:      Appearance: She is not ill-appearing or diaphoretic.  Pulmonary:     Effort: Pulmonary effort is normal.  Neurological:     Mental Status: She is alert and oriented to person, place, and time.     Ortho Exam Bilateral knees good range of motion.  No abnormal warmth erythema or effusion. Specialty Comments:  No specialty comments available.  Imaging: No results found.   PMFS History: Patient Active Problem List   Diagnosis Date Noted  . 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 Chronic left shoulder pain 01/21/2018  . Chronic pain of left knee 01/21/2018  . Chronic pain of right knee 01/21/2018  . Osteoarthritis of glenohumeral joint, left 01/21/2018  . Decreased renal function 01/29/2017  . Anemia 01/29/2017  . Preventative health care 01/29/2017  . Other fatigue 09/03/2016  . Primary insomnia 09/03/2016  . Primary osteoarthritis of both knees 09/03/2016  . DJD (degenerative joint disease), cervical 09/03/2016  . Osteopenia 03/04/2016  . Palpitations 09/19/2014  .  Thyroid nodule, uninodular 09/18/2011  . Hypothyroid 08/19/2011  . Postmenopausal HRT (hormone replacement therapy) 08/13/2011  . Mitral regurgitation   . Lumbar spine pain   . Dyslipidemia   . Chest pain   . Statin intolerance   . Mitral valve prolapse   . Ejection fraction   . Fibromyalgia   . ADENOMATOUS COLONIC POLYP 08/15/2007  . GERD 08/15/2007  . BARRETTS ESOPHAGUS 08/15/2007  . HIATAL HERNIA 08/15/2007  . Osteoarthritis 08/15/2007  . CATARACT EXTRACTION, HX OF 08/15/2007   Past Medical History:   Diagnosis Date  . Anxiety    prev on prozac  . Chest pain    Felt to be noncardiac in the past  . Chronic rectal fissure   . Colon polyp   . Dyslipidemia   . Ejection fraction    EF 60%, echo, 2007, atrial septum bows left to right compatible with increased left atrial pressure  . Fibromyalgia   . GERD (gastroesophageal reflux disease)    Barrett's esophagus  . Hiatal hernia   . Lumbar spine pain    Complex lumbar spine surgery at [facility], 7062, complicated, MRSA, much of the appliance is removed, patient on chronic antibiotics  . Melanoma ([place])   . Mitral regurgitation    Mild, 2007,  /  moderate or severe echo, September, 2012, eccentric jet wrapping the left atrium, consider TEE for further assessment  . Mitral valve prolapse    Mild mitral regurgitation echo, 2007  . Osteoarthritis   . Scoliosis   . Sinusitis    Multiple sinus operations in the past  . Statin intolerance    As of 2009, no further attempts to use statins  . Thyroid nodule     Family History  Problem Relation Age of Onset  . GER disease Mother   . Dementia Mother   . Breast cancer Mother   . Heart disease Father        MI at 86  . Scoliosis Daughter   . Colon cancer Neg Hx     Past Surgical History:  Procedure Laterality Date  . ABDOMINAL HYSTERECTOMY    . BACK SURGERY    . CHOLECYSTECTOMY    . FOOT SURGERY    . HERNIA REPAIR    . NASAL SINUS SURGERY     Social History   Occupational History  . Not on file  Tobacco Use  . Smoking status: Never Smoker  . Smokeless tobacco: Never Used  Substance and Sexual Activity  . Alcohol use: No  . Drug use: No  . Sexual activity: Not on file

## 2018-08-26 NOTE — Telephone Encounter (Signed)
[name] [name] wants bilateral Synvisc injections for patient in 6 months, is there a way we can schedule a reminder or anything? She just had some today

## 2018-08-27 NOTE — Telephone Encounter (Signed)
No, but I have it noted on her demographics sheet as a reminder for me.  Next appointment for gel injection would be after 02/26/2019.

## 2018-09-16 ENCOUNTER — Telehealth: Payer: Self-pay

## 2018-09-16 NOTE — Telephone Encounter (Signed)
Spoke with patients husband (okay per DPR) Made them aware that Dr. Rockey Situ was limiting patients coming into the office.  Offered telehealth appt. Patient declined.  Would like an in office appt.

## 2018-09-24 ENCOUNTER — Ambulatory Visit: Payer: Medicare Other | Admitting: Cardiovascular Disease

## 2018-09-24 ENCOUNTER — Other Ambulatory Visit: Payer: Self-pay | Admitting: Primary Care

## 2018-09-24 DIAGNOSIS — Z79890 Hormone replacement therapy (postmenopausal): Secondary | ICD-10-CM

## 2018-09-24 NOTE — Telephone Encounter (Signed)
Last prescribed on 10/28/2017. Last appointment on 02/03/2018. Next future appointment on 02/05/2019

## 2018-09-24 NOTE — Telephone Encounter (Signed)
Noted, refill sent to pharmacy. 

## 2018-09-28 ENCOUNTER — Other Ambulatory Visit: Payer: Self-pay | Admitting: Primary Care

## 2018-09-28 DIAGNOSIS — K601 Chronic anal fissure: Secondary | ICD-10-CM

## 2018-09-28 NOTE — Telephone Encounter (Signed)
Last prescribed on 02/03/2018. Last appointment on 02/03/2018. Next future appointment on 02/05/2019.

## 2018-09-28 NOTE — Telephone Encounter (Signed)
Noted, refill sent to pharmacy. 

## 2018-10-12 ENCOUNTER — Ambulatory Visit: Payer: Medicare Other

## 2018-11-10 NOTE — Progress Notes (Deleted)
Office Visit Note  Patient: [name] [name]             Date of Birth: 07-20-1935           MRN: [id number]             PCP: [name], NP Referring: [name], NP Visit Date: 11/24/2018 Occupation: @GUAROCC @  Subjective:  No chief complaint on file.   History of Present Illness: [name] [name] is a 83 y.o. female ***   Activities of Daily Living:  Patient reports morning stiffness for *** {minute/hour:19697}.   Patient {ACTIONS;DENIES/REPORTS:21021675::"Denies"} nocturnal pain.  Difficulty dressing/grooming: {ACTIONS;DENIES/REPORTS:21021675::"Denies"} Difficulty climbing stairs: {ACTIONS;DENIES/REPORTS:21021675::"Denies"} Difficulty getting out of chair: {ACTIONS;DENIES/REPORTS:21021675::"Denies"} Difficulty using hands for taps, buttons, cutlery, and/or writing: {ACTIONS;DENIES/REPORTS:21021675::"Denies"}  No Rheumatology ROS completed.   PMFS History:  Patient Active Problem List   Diagnosis Date Noted  . 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 Chronic left shoulder pain 01/21/2018  . Chronic pain of left knee 01/21/2018  . Chronic pain of right knee 01/21/2018  . Osteoarthritis of glenohumeral joint, left 01/21/2018  . Decreased renal function 01/29/2017  . Anemia 01/29/2017  . Preventative health care 01/29/2017  . Other fatigue 09/03/2016  . Primary insomnia 09/03/2016  . Primary osteoarthritis of both knees 09/03/2016  . DJD (degenerative joint disease), cervical 09/03/2016  . Osteopenia 03/04/2016  . Palpitations 09/19/2014  . Thyroid nodule, uninodular 09/18/2011  . Hypothyroid 08/19/2011  . Postmenopausal HRT (hormone replacement therapy) 08/13/2011  . Mitral regurgitation   . Lumbar spine pain   . Dyslipidemia   . Chest pain   . Statin intolerance   . Mitral valve prolapse   . Ejection fraction   . Fibromyalgia   . ADENOMATOUS COLONIC POLYP 08/15/2007  . GERD 08/15/2007  . BARRETTS ESOPHAGUS 08/15/2007  . HIATAL HERNIA 08/15/2007  .  Osteoarthritis 08/15/2007  . CATARACT EXTRACTION, HX OF 08/15/2007    Past Medical History:  Diagnosis Date  . Anxiety    prev on prozac  . Chest pain    Felt to be noncardiac in the past  . Chronic rectal fissure   . Colon polyp   . Dyslipidemia   . Ejection fraction    EF 60%, echo, 2007, atrial septum bows left to right compatible with increased left atrial pressure  . Fibromyalgia   . GERD (gastroesophageal reflux disease)    Barrett's esophagus  . Hiatal hernia   . Lumbar spine pain    Complex lumbar spine surgery at [facility], 1937, complicated, MRSA, much of the appliance is removed, patient on chronic antibiotics  . Melanoma ([place])   . Mitral regurgitation    Mild, 2007,  /  moderate or severe echo, September, 2012, eccentric jet wrapping the left atrium, consider TEE for further assessment  . Mitral valve prolapse    Mild mitral regurgitation echo, 2007  . Osteoarthritis   . Scoliosis   . Sinusitis    Multiple sinus operations in the past  . Statin intolerance    As of 2009, no further attempts to use statins  . Thyroid nodule     Family History  Problem Relation Age of Onset  . GER disease Mother   . Dementia Mother   . Breast cancer Mother   . Heart disease Father        MI at 69  . Scoliosis Daughter   . Colon cancer Neg Hx    Past Surgical History:  Procedure Laterality Date  . ABDOMINAL HYSTERECTOMY    .  BACK SURGERY    . CHOLECYSTECTOMY    . FOOT SURGERY    . HERNIA REPAIR    . NASAL SINUS SURGERY     Social History   Social History Narrative   From [place]   Married (second [phone]   3 kids, 2 of whom are local   Retired, Armed forces technical officer History  Administered Date(s) Administered  . Influenza Split 02/03/2012  . Influenza,inj,Quad PF,6+ Mos 02/20/2017, 02/03/2018  . Influenza-Unspecified 02/02/2016, 01/13/2017  . Pneumococcal Polysaccharide-23 02/03/2018  . Td 02/03/2016  . Tdap 10/22/2017     Objective: Vital  Signs: There were no vitals taken for this visit.   Physical Exam   Musculoskeletal Exam: ***  CDAI Exam: CDAI Score: - Patient Global: -; Provider Global: - Swollen: -; Tender: - Joint Exam   No joint exam has been documented for this visit   There is currently no information documented on the homunculus. Go to the Rheumatology activity and complete the homunculus joint exam.  Investigation: No additional findings.  Imaging: No results found.  Recent Labs: Lab Results  Component Value Date   WBC 6.1 01/29/2018   HGB 12.0 01/29/2018   PLT 251.0 01/29/2018   NA 140 01/29/2018   K 4.4 01/29/2018   CL 101 01/29/2018   CO2 29 01/29/2018   GLUCOSE 100 (H) 01/29/2018   BUN 25 (H) 01/29/2018   CREATININE 1.15 01/29/2018   BILITOT 0.3 01/29/2018   ALKPHOS 56 01/29/2018   AST 14 01/29/2018   ALT 13 01/29/2018   PROT 7.1 01/29/2018   ALBUMIN 4.3 01/29/2018   CALCIUM 9.6 01/29/2018    Speciality Comments: No specialty comments available.  Procedures:  No procedures performed Allergies: Other, Fenofibric acid, Nsaids, Statins, Trilipix [choline fenofibrate], and Wasp venom   Assessment / Plan:     Visit Diagnoses: No diagnosis found.  Orders: No orders of the defined types were placed in this encounter.  No orders of the defined types were placed in this encounter.   Face-to-face time spent with patient was *** minutes. Greater than 50% of time was spent in counseling and coordination of care.  Follow-Up Instructions: No follow-ups on file.   Ofilia Neas, PA-C  Note - This record has been created using Dragon software.  Chart creation errors have been sought, but may not always  have been located. Such creation errors do not reflect on  the standard of medical care.

## 2018-11-18 ENCOUNTER — Other Ambulatory Visit: Payer: Self-pay | Admitting: Primary Care

## 2018-11-18 DIAGNOSIS — E785 Hyperlipidemia, unspecified: Secondary | ICD-10-CM

## 2018-11-21 NOTE — Progress Notes (Signed)
Cardiology Office Note  Date:  11/23/2018   ID:  [name] [name], DOB Aug 07, 1935, MRN [id number]  PCP:  [name], NP   Chief Complaint  Patient presents with  . Other    6 month follow up. Patient c/o SOB when walking. Meds reviewed vebally with patient.     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 mitral valve disease , Mild to moderate regurgitation July 2016 Who presents for follow-up of her mitral valve disease, hyperlipidemia  Presents in a wheelchair No dizziness Still with rare palpitations Takes metoprolol tartrate 50 in the morning and 20 5 in the evening Blood pressure running low   chronic back pain She has no chest pain or SOB.No near syncope, syncope or LE edema.  Sleeps in a chair  Prior history MRSA again discussed with her, she is on suppressive antibiotics Back pain, neck pain, does not want surgery out of fear of her MRSA recurring Was told at some point she will need to have neck surgery before symptoms get severe  EKG personally reviewed by myself on todays visit  shows Normal sinus rhythm with rate 73 bpm no significant ST or T wave changes  Other past medical history reviewed echo July 2016 with normal LV systolic function, mild to moderate mitral regurgitation.   Echo 2018 - Normal LV systolic function; mild diastolic dysfunction; mild MR.     PMH:   has a past medical history of Anxiety, Chest pain, Chronic rectal fissure, Colon polyp, Dyslipidemia, Ejection fraction, Fibromyalgia, GERD (gastroesophageal reflux disease), Hiatal hernia, Lumbar spine pain, Melanoma ([facility]), Mitral regurgitation, Mitral valve prolapse, Osteoarthritis, Scoliosis, Sinusitis, Statin intolerance, and Thyroid nodule.  PSH:     Past Surgical History:  Procedure Laterality Date  . ABDOMINAL HYSTERECTOMY    . BACK SURGERY    . CHOLECYSTECTOMY    . FOOT SURGERY    . HERNIA REPAIR    . NASAL SINUS SURGERY      Current Outpatient Medications  Medication Sig Dispense Refill  . ALPHA LIPOIC ACID PO Take 1 tablet by mouth daily. Daily     . Biotin 10 MG TABS Take 1 tablet by mouth daily. Daily     . calcium carbonate (OS-CAL) 600 MG TABS Take 1,200 mg by mouth 2 (two) times daily with a meal.    . cholecalciferol (VITAMIN D) 1000 UNITS tablet Take 1,000 Units by mouth daily.    . clotrimazole-betamethasone (LOTRISONE) cream Apply 1 application topically 2 (two) times daily as needed. 30 g 0  . Diclofenac Sodium (PENNSAID) 2 % SOLN PLACE 2 PUMP ONTO THE SKIN 2 (TWO) TIMES DAILY. 112 g 1  . diclofenac sodium (VOLTAREN) 1 % GEL Apply 2-4 grams to affected area up to 4 times daily 4 Tube 2  . estrogens, conjugated, (PREMARIN) 0.3 MG tablet Take 1 tablet (0.3 mg total) by mouth every other day. 90 tablet 0  . famotidine (PEPCID) 20 MG tablet TAKE 1 TABLET BY MOUTH ONCE DAILY FOR HEARTBURN (Patient taking differently: as needed. Take 1 tablet by mouth once daily for heartburn.) 90 tablet 2  . fenofibrate micronized (LOFIBRA) 134 MG capsule TAKE 1 CAPSULE (134 MG TOTAL) BY MOUTH AT BEDTIME. 90 capsule 1  .  fish oil-omega-3 fatty acids 1000 MG capsule Take 2 g by mouth daily.    Marland Kitchen gabapentin (NEURONTIN) 100 MG capsule TAKE 1 CAPSULE BY MOUTH THREE TIMES A DAY 270 capsule 1  . Ginger, Zingiber officinalis, (GINGER PO) Take 1 tablet by mouth daily.    Marland Kitchen levothyroxine (SYNTHROID) 50 MCG tablet TAKE 1 TABLET BY MOUTH EVERY MORNING ON AN EMPTY STOMACH WITH A FULL GLASS OF WATER. 90 tablet 1  . metoprolol tartrate (LOPRESSOR) 50 MG tablet TAKE 1 TABLET ( 50 MG TOTAL) BY MOUTH IN THE MORNING AND 1/2 TABLET BY MOUTH IN THE EVENING. Please keep upcoming appt in March. Thank you 135 tablet 3  . NON FORMULARY Take 1 capsule by mouth  daily. Tumeric      Daily     . sulfamethoxazole-trimethoprim (BACTRIM DS) 800-160 MG per tablet Take 1 tablet by mouth 2 (two) times daily.     Marland Kitchen tretinoin (RETIN-A) 0.05 % cream Apply 1 application topically daily as needed (as directed per dermatologist).   3   No current facility-administered medications for this visit.      Allergies:   Other, Fenofibric acid, Nsaids, Statins, Trilipix [choline fenofibrate], and Wasp venom   Social History:  The patient  reports that she has never smoked. She has never used smokeless tobacco. She reports that she does not drink alcohol or use drugs.   Family History:   family history includes Breast cancer in her mother; Dementia in her mother; GER disease in her mother; Heart disease in her father; Scoliosis in her daughter.    Review of Systems: Review of Systems  Constitutional: Negative.   Respiratory: Negative.   Cardiovascular: Negative.   Gastrointestinal: Negative.   Musculoskeletal: Positive for back pain.  Neurological: Negative.   Psychiatric/Behavioral: Negative.   All other systems reviewed and are negative.   PHYSICAL EXAM: VS:  BP (!) 104/50 (BP Location: Left Arm, Patient Position: Sitting, Cuff Size: Normal)   Pulse 73   Ht 4\' 9"  (1.448 m)   Wt 146 lb 8 oz (66.5 kg)   BMI 31.70 kg/m  , BMI Body mass index is 31.7 kg/m. Constitutional:  oriented to person, place, and time. No distress.  HENT:  Head: Grossly normal Eyes:  no discharge. No scleral icterus.  Neck: No JVD, no carotid bruits  Cardiovascular: Regular rate and rhythm, 1/6 murmurs appreciated Pulmonary/Chest: Clear to auscultation bilaterally, no wheezes or rails Abdominal: Soft.  no distension.  no tenderness.  Musculoskeletal: Normal range of motion Neurological:  normal muscle tone. Coordination normal. No atrophy Skin: Skin warm and dry Psychiatric: normal affect, pleasant   Recent Labs: 01/29/2018: ALT 13; BUN 25; Creatinine, Ser 1.15; Hemoglobin  12.0; Platelets 251.0; Potassium 4.4; Sodium 140; TSH 3.17    Lipid Panel Lab Results  Component Value Date   CHOL 155 01/29/2018   HDL 61.20 01/29/2018   LDLCALC 71 01/29/2018   TRIG 115.0 01/29/2018      Wt Readings from Last 3 Encounters:  11/23/18 146 lb 8 oz (66.5 kg)  05/26/18 147 lb 4.8 oz (66.8 kg)  02/03/18 146 lb (66.2 kg)       ASSESSMENT AND PLAN:  Non-rheumatic mitral regurgitation Mitral valve regurgitation, mild No further echo needed Murmur coming from aortic valve sclerosis without stenosis Murmur not very impressive on today's visit  Palpitations - Plan: EKG 12-Lead No significant palpitations noted Suggested she could try to decrease metoprolol down to 25 twice daily and take extra 25 as needed  for breakthrough palpitations  Precordial pain Denies significant pain on today's visit, no further workup  Dyslipidemia Currently not on a statin, previously tried Zetia had diarrhea No changes  Fibromyalgia mild Managed by primary care Recommended regular walking program  Back pain Numerous back surgeries 8590 at [facility] complication with MRSA Discussed with her on today's visit, also with other arthritides including left shoulder and neck  Disposition:   F/U   As needed   Total encounter time more than 25 minutes  Greater than 50% was spent in counseling and coordination of care with the patient    Orders Placed This Encounter  Procedures  . EKG 12-Lead     Signed, Esmond Plants, M.D., Ph.D. 11/23/2018  [place], [place]

## 2018-11-23 ENCOUNTER — Other Ambulatory Visit: Payer: Self-pay

## 2018-11-23 ENCOUNTER — Ambulatory Visit (INDEPENDENT_AMBULATORY_CARE_PROVIDER_SITE_OTHER): Payer: BC Managed Care – PPO | Admitting: Cardiovascular Disease

## 2018-11-23 ENCOUNTER — Encounter: Payer: Self-pay | Admitting: Cardiovascular Disease

## 2018-11-23 VITALS — BP 104/50 | HR 73 | Ht <= 58 in | Wt 146.5 lb

## 2018-11-23 DIAGNOSIS — I340 Nonrheumatic mitral (valve) insufficiency: Secondary | ICD-10-CM | POA: Diagnosis not present

## 2018-11-23 DIAGNOSIS — R072 Precordial pain: Secondary | ICD-10-CM | POA: Diagnosis not present

## 2018-11-23 DIAGNOSIS — R002 Palpitations: Secondary | ICD-10-CM | POA: Diagnosis not present

## 2018-11-23 DIAGNOSIS — M797 Fibromyalgia: Secondary | ICD-10-CM

## 2018-11-23 DIAGNOSIS — E785 Hyperlipidemia, unspecified: Secondary | ICD-10-CM

## 2018-11-23 NOTE — Patient Instructions (Addendum)

## 2018-11-24 ENCOUNTER — Ambulatory Visit: Payer: Self-pay | Admitting: Physician Assistant

## 2018-11-25 ENCOUNTER — Other Ambulatory Visit: Payer: Self-pay

## 2018-11-25 ENCOUNTER — Ambulatory Visit
Admission: RE | Admit: 2018-11-25 | Discharge: 2018-11-25 | Disposition: A | Discharge status: Home / Self Care | Payer: BC Managed Care – PPO | Source: Ambulatory Visit | Attending: Primary Care | Admitting: Primary Care

## 2018-11-25 DIAGNOSIS — Z1231 Encounter for screening mammogram for malignant neoplasm of breast: Secondary | ICD-10-CM | POA: Diagnosis not present

## 2018-12-03 DIAGNOSIS — L820 Inflamed seborrheic keratosis: Secondary | ICD-10-CM | POA: Diagnosis not present

## 2018-12-03 DIAGNOSIS — Z85820 Personal history of malignant melanoma of skin: Secondary | ICD-10-CM | POA: Diagnosis not present

## 2018-12-03 DIAGNOSIS — D225 Melanocytic nevi of trunk: Secondary | ICD-10-CM | POA: Diagnosis not present

## 2019-01-25 ENCOUNTER — Encounter: Payer: Self-pay | Admitting: Orthopaedic Surgery

## 2019-01-25 ENCOUNTER — Telehealth: Payer: Self-pay

## 2019-01-25 ENCOUNTER — Ambulatory Visit (INDEPENDENT_AMBULATORY_CARE_PROVIDER_SITE_OTHER): Payer: Medicare Other | Admitting: Orthopaedic Surgery

## 2019-01-25 ENCOUNTER — Other Ambulatory Visit: Payer: Self-pay

## 2019-01-25 DIAGNOSIS — M1711 Unilateral primary osteoarthritis, right knee: Secondary | ICD-10-CM | POA: Diagnosis not present

## 2019-01-25 DIAGNOSIS — M25561 Pain in right knee: Secondary | ICD-10-CM | POA: Diagnosis not present

## 2019-01-25 DIAGNOSIS — M25562 Pain in left knee: Secondary | ICD-10-CM

## 2019-01-25 DIAGNOSIS — G8929 Other chronic pain: Secondary | ICD-10-CM

## 2019-01-25 DIAGNOSIS — M1712 Unilateral primary osteoarthritis, left knee: Secondary | ICD-10-CM | POA: Diagnosis not present

## 2019-01-25 NOTE — Telephone Encounter (Signed)
Bilateral gel injections appt already scheduled per patient in November

## 2019-01-25 NOTE — Progress Notes (Signed)
Office Visit Note   Patient: [name] [name]           Date of Birth: 1935-05-21           MRN: [id number] Visit Date: 01/25/2019              Requested by: [name], NP [place],  [place] [address] PCP: [name], NP   Assessment & Plan: Visit Diagnoses:  1. Chronic pain of right knee   2. Chronic pain of left knee   3. Unilateral primary osteoarthritis, right knee   4. Unilateral primary osteoarthritis, left knee     Plan: I was able to successfully place a steroid injection in both knees today.  We will see her back next month for Synvisc 1 in both knees.  All question concerns were answered and addressed.  Follow-Up Instructions: Return if symptoms worsen or fail to improve.   Orders:  No orders of the defined types were placed in this encounter.  No orders of the defined types were placed in this encounter.     Procedures: Large Joint Inj: bilateral knee on 01/25/2019 2:26 PM Indications: diagnostic evaluation and pain Details: 22 G 1.5 in needle, superolateral approach  Arthrogram: No  Outcome: tolerated well, no immediate complications Procedure, treatment alternatives, risks and benefits explained, specific risks discussed. Consent was given by the patient. Immediately prior to procedure a time out was called to verify the correct patient, procedure, equipment, support staff and site/side marked as required. Patient was prepped and draped in the usual sterile fashion.       Clinical Data: No additional findings.   Subjective: Chief Complaint  Patient presents with   Left Knee - Follow-up, Pain   Right Knee - Pain, Follow-up  The patient is well-known to [name] [name].  She has severe osteoarthritis in both knees.  She has had hyaluronic acid injections and that does last for about 5 months.  She is hoping to have that again her knees next month but needs a steroid in both knees to help subside her symptoms.  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  She is on chronic antibiotics due to this.  Her right knee pain and left knee pain are daily and they have detrimentally affecting her mobility, her quality of life and her activities daily living.  She is however not needing to walk with assistive device.  HPI  Review of Systems She currently denies any headache, chest pain, shortness of breath, fever, chills, nausea, vomiting  Objective: Vital Signs: There were no vitals taken for this visit.  Physical Exam She is alert and orient x3 and in no acute distress Ortho Exam Examination of both her knees show no acute findings.  Neither knee has an effusion but both knees have medial joint line tenderness and patellofemoral crepitation.  Previous x-rays do show significant arthritis in both knees Specialty Comments:  No specialty comments available.  Imaging: No results found.   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   Chronic left shoulder pain 01/21/2018   Chronic pain of left knee 01/21/2018   Chronic pain of right knee 01/21/2018   Osteoarthritis of glenohumeral joint, left 01/21/2018   Decreased renal function 01/29/2017   Anemia 01/29/2017   Preventative health care 01/29/2017   Other fatigue 09/03/2016  Primary insomnia 09/03/2016   Primary osteoarthritis of both knees 09/03/2016   DJD (degenerative joint disease), cervical 09/03/2016   Osteopenia 03/04/2016   Palpitations 09/19/2014   Thyroid nodule, uninodular 09/18/2011   Hypothyroid 08/19/2011   Postmenopausal HRT (hormone replacement therapy) 08/13/2011   Mitral regurgitation    Lumbar spine pain    Dyslipidemia    Chest pain    Statin intolerance    Mitral valve prolapse    Ejection fraction    Fibromyalgia    ADENOMATOUS COLONIC POLYP 08/15/2007   GERD 08/15/2007    BARRETTS ESOPHAGUS 08/15/2007   HIATAL HERNIA 08/15/2007   Osteoarthritis 08/15/2007   CATARACT EXTRACTION, HX OF 08/15/2007   Past Medical History:  Diagnosis Date   Anxiety    prev on prozac   Chest pain    Felt to be noncardiac in the past   Chronic rectal fissure    Colon polyp    Dyslipidemia    Ejection fraction    EF 60%, echo, 2007, atrial septum bows left to right compatible with increased left atrial pressure   Fibromyalgia    GERD (gastroesophageal reflux disease)    Barrett's esophagus   Hiatal hernia    Lumbar spine pain    Complex lumbar spine surgery at [facility], 0000000, complicated, MRSA, much of the appliance is removed, patient on chronic antibiotics   Melanoma ([place])    Mitral regurgitation    Mild, 2007,  /  moderate or severe echo, September, 2012, eccentric jet wrapping the left atrium, consider TEE for further assessment   Mitral valve prolapse    Mild mitral regurgitation echo, 2007   Osteoarthritis    Scoliosis    Sinusitis    Multiple sinus operations in the past   Statin intolerance    As of 2009, no further attempts to use statins   Thyroid nodule     Family History  Problem Relation Age of Onset   GER disease Mother    Dementia Mother    Breast cancer Mother    Heart disease Father        MI at 70   Scoliosis Daughter    Colon cancer Neg Hx     Past Surgical History:  Procedure Laterality Date   ABDOMINAL HYSTERECTOMY     BACK SURGERY     CHOLECYSTECTOMY     FOOT SURGERY     HERNIA REPAIR     NASAL SINUS SURGERY     Social History   Occupational History   Not on file  Tobacco Use   Smoking status: Never Smoker   Smokeless tobacco: Never Used  Substance and Sexual Activity   Alcohol use: No   Drug use: No   Sexual activity: Not on file

## 2019-01-27 NOTE — Telephone Encounter (Signed)
Noted  

## 2019-01-29 ENCOUNTER — Other Ambulatory Visit: Payer: Self-pay | Admitting: Primary Care

## 2019-01-29 DIAGNOSIS — E785 Hyperlipidemia, unspecified: Secondary | ICD-10-CM

## 2019-01-29 DIAGNOSIS — E039 Hypothyroidism, unspecified: Secondary | ICD-10-CM

## 2019-01-29 DIAGNOSIS — R739 Hyperglycemia, unspecified: Secondary | ICD-10-CM

## 2019-01-29 DIAGNOSIS — D649 Anemia, unspecified: Secondary | ICD-10-CM

## 2019-02-01 ENCOUNTER — Telehealth: Payer: Self-pay

## 2019-02-01 NOTE — Telephone Encounter (Signed)
Submitted VOB for SynviscOne, bilateral knee. 

## 2019-02-05 ENCOUNTER — Other Ambulatory Visit: Payer: Self-pay

## 2019-02-05 ENCOUNTER — Other Ambulatory Visit (INDEPENDENT_AMBULATORY_CARE_PROVIDER_SITE_OTHER): Payer: Medicare Other

## 2019-02-05 ENCOUNTER — Ambulatory Visit (INDEPENDENT_AMBULATORY_CARE_PROVIDER_SITE_OTHER): Payer: Medicare Other

## 2019-02-05 ENCOUNTER — Ambulatory Visit: Payer: Medicare Other

## 2019-02-05 DIAGNOSIS — D649 Anemia, unspecified: Secondary | ICD-10-CM

## 2019-02-05 DIAGNOSIS — R739 Hyperglycemia, unspecified: Secondary | ICD-10-CM

## 2019-02-05 DIAGNOSIS — Z0000 Encounter for general adult medical examination without abnormal findings: Secondary | ICD-10-CM | POA: Diagnosis not present

## 2019-02-05 DIAGNOSIS — E039 Hypothyroidism, unspecified: Secondary | ICD-10-CM | POA: Diagnosis not present

## 2019-02-05 DIAGNOSIS — E785 Hyperlipidemia, unspecified: Secondary | ICD-10-CM | POA: Diagnosis not present

## 2019-02-05 LAB — COMPREHENSIVE METABOLIC PANEL
ALT: 12 U/L (ref 0–35)
AST: 18 U/L (ref 0–37)
Albumin: 4.5 g/dL (ref 3.5–5.2)
Alkaline Phosphatase: 47 U/L (ref 39–117)
BUN: 26 mg/dL — ABNORMAL HIGH (ref 6–23)
CO2: 31 mEq/L (ref 19–32)
Calcium: 9.3 mg/dL (ref 8.4–10.5)
Chloride: 103 mEq/L (ref 96–112)
Creatinine, Ser: 1.03 mg/dL (ref 0.40–1.20)
GFR: 51.33 mL/min — ABNORMAL LOW (ref >60.00)
Glucose, Bld: 95 mg/dL (ref 70–99)
Potassium: 4 mEq/L (ref 3.5–5.1)
Sodium: 140 mEq/L (ref 135–145)
Total Bilirubin: 0.5 mg/dL (ref 0.2–1.2)
Total Protein: 6.6 g/dL (ref 6.0–8.3)

## 2019-02-05 LAB — CBC
HCT: 35.5 % — ABNORMAL LOW (ref 36.0–46.0)
Hemoglobin: 11.6 g/dL — ABNORMAL LOW (ref 12.0–15.0)
MCHC: 32.7 g/dL (ref 30.0–36.0)
MCV: 96.4 fl (ref 78.0–100.0)
Platelets: 192 10*3/uL (ref 150.0–400.0)
RBC: 3.68 Mil/uL — ABNORMAL LOW (ref 3.87–5.11)
RDW: 13.9 % (ref 11.5–15.5)
WBC: 5.3 10*3/uL (ref 4.0–10.5)

## 2019-02-05 LAB — LIPID PANEL
Cholesterol: 160 mg/dL (ref 0–200)
HDL: 65 mg/dL (ref >39.00)
LDL Cholesterol: 71 mg/dL (ref 0–99)
NonHDL: 95.07
Total CHOL/HDL Ratio: 2
Triglycerides: 121 mg/dL (ref 0.0–149.0)
VLDL: 24.2 mg/dL (ref 0.0–40.0)

## 2019-02-05 LAB — TSH: TSH: 4.99 u[IU]/mL — ABNORMAL HIGH (ref 0.35–4.50)

## 2019-02-05 LAB — HEMOGLOBIN A1C: Hgb A1c MFr Bld: 4.3 % — ABNORMAL LOW (ref 4.6–6.5)

## 2019-02-05 NOTE — Progress Notes (Signed)
Subjective:   [name] [name] is a 83 y.o. female who presents for Medicare Annual (Subsequent) preventive examination.  Review of Systems:    This visit is being conducted through telemedicine via telephone at the nurse health advisor's home address due to the COVID-19 pandemic. This patient has given me verbal consent via doximity to conduct this visit, patient states they are participating from their home address. Patient and myself are on the telephone call. There is no referral for this visit. Some vital signs may be absent or patient reported.    Patient identification: identified by name, DOB, and current address   Cardiac Risk Factors include: advanced age (>67men, >30 women);dyslipidemia     Objective:     Vitals: There were no vitals taken for this visit.  There is no height or weight on file to calculate BMI.  Advanced Directives 02/05/2019 01/27/2017  Does Patient Have a Medical Advance Directive? No No  Would patient like information on creating a medical advance directive? No - Patient declined -    Tobacco Social History   Tobacco Use  Smoking Status Never Smoker  Smokeless Tobacco Never Used     Counseling given: Not Answered   Clinical Intake:  Pre-visit preparation completed: Yes  Pain : No/denies pain     Nutritional Risks: None Diabetes: No  How often do you need to have someone help you when you read instructions, pamphlets, or other written materials from your doctor or pharmacy?: 1 - Never What is the last grade level you completed in school?: associate degree  Interpreter Needed?: No  Information entered by :: CJohnson,LPN  Past Medical History:  Diagnosis Date  . Anxiety    prev on prozac  . Chest pain    Felt to be noncardiac in the past  . Chronic rectal fissure   . Colon polyp   . Dyslipidemia   . Ejection fraction    EF 60%, echo, 2007, atrial septum bows left to right compatible with increased left atrial pressure  .  Fibromyalgia   . GERD (gastroesophageal reflux disease)    Barrett's esophagus  . Hiatal hernia   . Lumbar spine pain    Complex lumbar spine surgery at [facility], 0000000, complicated, MRSA, much of the appliance is removed, patient on chronic antibiotics  . Melanoma ([place])   . Mitral regurgitation    Mild, 2007,  /  moderate or severe echo, September, 2012, eccentric jet wrapping the left atrium, consider TEE for further assessment  . Mitral valve prolapse    Mild mitral regurgitation echo, 2007  . Osteoarthritis   . Scoliosis   . Sinusitis    Multiple sinus operations in the past  . Statin intolerance    As of 2009, no further attempts to use statins  . Thyroid nodule    Past Surgical History:  Procedure Laterality Date  . ABDOMINAL HYSTERECTOMY    . BACK SURGERY    . CHOLECYSTECTOMY    . FOOT SURGERY    . HERNIA REPAIR    . NASAL SINUS SURGERY     Family History  Problem Relation Age of Onset  . GER disease Mother   . Dementia Mother   . Breast cancer Mother   . Heart disease Father        MI at 37  . Scoliosis Daughter   . Colon cancer Neg Hx    Social History   Socioeconomic History  . Marital status: Married    Spouse name:  Not on file  . Number of children: Not on file  . Years of education: Not on file  . Highest education level: Not on file  Occupational History  . Not on file  Social Needs  . Financial resource strain: Not hard at all  . Food insecurity    Worry: Never true    Inability: Never true  . Transportation needs    Medical: No    Non-medical: No  Tobacco Use  . Smoking status: Never Smoker  . Smokeless tobacco: Never Used  Substance and Sexual Activity  . Alcohol use: No  . Drug use: No  . Sexual activity: Not on file  Lifestyle  . Physical activity    Days per week: 0 days    Minutes per session: 0 min  . Stress: Not at all  Relationships  . Social Herbalist on phone: Not on file    Gets together: Not on file    Attends  religious service: Not on file    Active member of club or organization: Not on file    Attends meetings of clubs or organizations: Not on file    Relationship status: Not on file  Other Topics Concern  . Not on file  Social History Narrative   From [place]   Married (second [phone]   3 kids, 2 of whom are local   Retired, Camera operator    Outpatient Encounter Medications as of 02/05/2019  Medication Sig  . ALPHA LIPOIC ACID PO Take 1 tablet by mouth daily. Daily   . Biotin 10 MG TABS Take 1 tablet by mouth daily. Daily   . calcium carbonate (OS-CAL) 600 MG TABS Take 1,200 mg by mouth 2 (two) times daily with a meal.  . cholecalciferol (VITAMIN D) 1000 UNITS tablet Take 1,000 Units by mouth daily.  . clotrimazole-betamethasone (LOTRISONE) cream Apply 1 application topically 2 (two) times daily as needed.  . Diclofenac Sodium (PENNSAID) 2 % SOLN PLACE 2 PUMP ONTO THE SKIN 2 (TWO) TIMES DAILY.  Marland Kitchen diclofenac sodium (VOLTAREN) 1 % GEL Apply 2-4 grams to affected area up to 4 times daily  . estrogens, conjugated, (PREMARIN) 0.3 MG tablet Take 1 tablet (0.3 mg total) by mouth every other day.  . famotidine (PEPCID) 20 MG tablet TAKE 1 TABLET BY MOUTH ONCE DAILY FOR HEARTBURN (Patient taking differently: as needed. Take 1 tablet by mouth once daily for heartburn.)  . fenofibrate micronized (LOFIBRA) 134 MG capsule TAKE 1 CAPSULE (134 MG TOTAL) BY MOUTH AT BEDTIME.  . fish oil-omega-3 fatty acids 1000 MG capsule Take 2 g by mouth daily.  Marland Kitchen gabapentin (NEURONTIN) 100 MG capsule TAKE 1 CAPSULE BY MOUTH THREE TIMES A DAY  . Ginger, Zingiber officinalis, (GINGER PO) Take 1 tablet by mouth daily.  Marland Kitchen levothyroxine (SYNTHROID) 50 MCG tablet TAKE 1 TABLET BY MOUTH EVERY MORNING ON AN EMPTY STOMACH WITH A FULL GLASS OF WATER.  . metoprolol tartrate (LOPRESSOR) 50 MG tablet TAKE 1 TABLET ( 50 MG TOTAL) BY MOUTH IN THE MORNING AND 1/2 TABLET BY MOUTH IN THE EVENING. Please keep upcoming appt  in March. Thank you  . NON FORMULARY Take 1 capsule by mouth daily. Tumeric      Daily   . sulfamethoxazole-trimethoprim (BACTRIM DS) 800-160 MG per tablet Take 1 tablet by mouth 2 (two) times daily.   Marland Kitchen tretinoin (RETIN-A) 0.05 % cream Apply 1 application topically daily as needed (as directed per dermatologist).  No facility-administered encounter medications on file as of 02/05/2019.     Activities of Daily Living In your present state of health, do you have any difficulty performing the following activities: 02/05/2019  Hearing? N  Vision? N  Difficulty concentrating or making decisions? N  Walking or climbing stairs? N  Dressing or bathing? N  Doing errands, shopping? N  Preparing Food and eating ? N  Using the Toilet? N  In the past six months, have you accidently leaked urine? N  Do you have problems with loss of bowel control? N  Managing your Medications? N  Managing your Finances? N  Housekeeping or managing your Housekeeping? N  Some recent data might be hidden    Patient Care Team: [name], NP as PCP - General (Internal Medicine)    Assessment:   This is a routine wellness examination for [place].  Exercise Activities and Dietary recommendations Current Exercise Habits: Home exercise routine, Type of exercise: Other - see comments(exercise bike), Time (Minutes): 30, Frequency (Times/Week): 3, Weekly Exercise (Minutes/Week): 90, Intensity: Mild, Exercise limited by: None identified  Goals    . Health management     Starting 01/27/2017, I will continue to exercise for 60 min 3 days per week, to drink 5-6 glasses of water daily, and to take medications as prescribed.     . Patient Stated     02/05/2019, I will exercise more daily and go to the gym.        Fall Risk Fall Risk  02/05/2019 02/03/2018 01/27/2017  Falls in the past year? 0 No No  Number falls in past yr: 0 - -  Injury with Fall? 0 - -  Risk for fall due to : Medication side effect - -   Follow up Falls evaluation completed;Falls prevention discussed - -   Is the patient's home free of loose throw rugs in walkways, pet beds, electrical cords, etc?   yes      Grab bars in the bathroom? yes      Handrails on the stairs?   yes      Adequate lighting?   yes  Timed Get Up and Go performed: N/A  Depression Screen PHQ 2/9 Scores 02/05/2019 02/03/2018 01/27/2017  PHQ - 2 Score 0 0 0  PHQ- 9 Score 0 - 0     Cognitive Function MMSE - Mini Mental State Exam 02/05/2019 01/27/2017  Orientation to time 5 5  Orientation to Place 5 5  Registration 3 3  Attention/ Calculation 5 0  Recall 3 3  Language- name 2 objects - 0  Language- repeat 1 1  Language- follow 3 step command - 3  Language- read & follow direction - 0  Write a sentence - 0  Copy design - 0  Total score - 20  Mini Cog  Mini-Cog screen was completed. Maximum score is 22. A value of 0 denotes this part of the MMSE was not completed or the patient failed this part of the Mini-Cog screening.       Immunization History  Administered Date(s) Administered  . Influenza Split 02/03/2012  . Influenza,inj,Quad PF,6+ Mos 02/20/2017, 02/03/2018  . Influenza-Unspecified 02/02/2016, 01/13/2017, 02/02/2018  . Pneumococcal Polysaccharide-23 02/03/2018  . Td 02/03/2016  . Tdap 10/22/2017    Qualifies for Shingles Vaccine? Yes  Screening Tests Health Maintenance  Topic Date Due  . INFLUENZA VACCINE  11/14/2018  . PNA vac Low Risk Adult (2 of 2 - PCV13) 02/04/2019  . TETANUS/TDAP  10/23/2027  .  DEXA SCAN  Completed    Cancer Screenings: Lung: Low Dose CT Chest recommended if Age 67-80 years, 30 pack-year currently smoking OR have quit w/in 15years. Patient does not qualify. Breast:  Up to date on Mammogram? Yes, completed 11/25/2018   Up to date of Bone Density/Dexa? Yes, completed 10/03/2017 Colorectal: no longer required  Additional Screenings:  Hepatitis C Screening: N/A     Plan:    Patient will  increase exercise and go to the gym more often.    I have personally reviewed and noted the following in the patient's chart:   . Medical and social history . Use of alcohol, tobacco or illicit drugs  . Current medications and supplements . Functional ability and status . Nutritional status . Physical activity . Advanced directives . List of other physicians . Hospitalizations, surgeries, and ER visits in previous 12 months . Vitals . Screenings to include cognitive, depression, and falls . Referrals and appointments  In addition, I have reviewed and discussed with patient certain preventive protocols, quality metrics, and best practice recommendations. A written personalized care plan for preventive services as well as general preventive health recommendations were provided to patient.     Andrez Grime, LPN  [phone]

## 2019-02-05 NOTE — Progress Notes (Signed)
I reviewed health advisor's note, was available for consultation, and agree with documentation and plan.  

## 2019-02-05 NOTE — Patient Instructions (Signed)
[name] [name] , Thank you for taking time to come for your Medicare Wellness Visit. I appreciate your ongoing commitment to your health goals. Please review the following plan we discussed and let me know if I can assist you in the future.   Screening recommendations/referrals: Colonoscopy: no longer required Mammogram: up to date, completed 11/25/2018 Bone Density: up to date, completed 10/03/2017 Recommended yearly ophthalmology/optometry visit for glaucoma screening and checkup Recommended yearly dental visit for hygiene and checkup  Vaccinations: Influenza vaccine: will get at office visit Pneumococcal vaccine: will get at office visit Tdap vaccine: up to date, completed 10/22/2017 Shingles vaccine: declined    Advanced directives: Advance directive discussed with you today. Even though you declined this today please call our office should you change your mind and we can give you the proper paperwork for you to fill out.  Conditions/risks identified: hyperlipidemia  Next appointment: 02/10/2019 @ 9 am    Preventive Care 65 Years and Older, Female Preventive care refers to lifestyle choices and visits with your health care provider that can promote health and wellness. What does preventive care include?  A yearly physical exam. This is also called an annual well check.  Dental exams once or twice a year.  Routine eye exams. Ask your health care provider how often you should have your eyes checked.  Personal lifestyle choices, including:  Daily care of your teeth and gums.  Regular physical activity.  Eating a healthy diet.  Avoiding tobacco and drug use.  Limiting alcohol use.  Practicing safe sex.  Taking low-dose aspirin every day.  Taking vitamin and mineral supplements as recommended by your health care provider. What happens during an annual well check? The services and screenings done by your health care provider during your annual well check will depend on your  age, overall health, lifestyle risk factors, and family history of disease. Counseling  Your health care provider may ask you questions about your:  Alcohol use.  Tobacco use.  Drug use.  Emotional well-being.  Home and relationship well-being.  Sexual activity.  Eating habits.  History of falls.  Memory and ability to understand (cognition).  Work and work Statistician.  Reproductive health. Screening  You may have the following tests or measurements:  Height, weight, and BMI.  Blood pressure.  Lipid and cholesterol levels. These may be checked every 5 years, or more frequently if you are over 39 years old.  Skin check.  Lung cancer screening. You may have this screening every year starting at age 67 if you have a 30-pack-year history of smoking and currently smoke or have quit within the past 15 years.  Fecal occult blood test (FOBT) of the stool. You may have this test every year starting at age 19.  Flexible sigmoidoscopy or colonoscopy. You may have a sigmoidoscopy every 5 years or a colonoscopy every 10 years starting at age 18.  Hepatitis C blood test.  Hepatitis B blood test.  Sexually transmitted disease (STD) testing.  Diabetes screening. This is done by checking your blood sugar (glucose) after you have not eaten for a while (fasting). You may have this done every 1-3 years.  Bone density scan. This is done to screen for osteoporosis. You may have this done starting at age 49.  Mammogram. This may be done every 1-2 years. Talk to your health care provider about how often you should have regular mammograms. Talk with your health care provider about your test results, treatment options, and if necessary, the  need for more tests. Vaccines  Your health care provider may recommend certain vaccines, such as:  Influenza vaccine. This is recommended every year.  Tetanus, diphtheria, and acellular pertussis (Tdap, Td) vaccine. You may need a Td booster  every 10 years.  Zoster vaccine. You may need this after age 2.  Pneumococcal 13-valent conjugate (PCV13) vaccine. One dose is recommended after age 49.  Pneumococcal polysaccharide (PPSV23) vaccine. One dose is recommended after age 64. Talk to your health care provider about which screenings and vaccines you need and how often you need them. This information is not intended to replace advice given to you by your health care provider. Make sure you discuss any questions you have with your health care provider. Document Released: 04/28/2015 Document Revised: 12/20/2015 Document Reviewed: 01/31/2015 Elsevier Interactive Patient Education  2017 [place] Prevention in the Home Falls can cause injuries. They can happen to people of all ages. There are many things you can do to make your home safe and to help prevent falls. What can I do on the outside of my home?  Regularly fix the edges of walkways and driveways and fix any cracks.  Remove anything that might make you trip as you walk through a door, such as a raised step or threshold.  Trim any bushes or trees on the path to your home.  Use bright outdoor lighting.  Clear any walking paths of anything that might make someone trip, such as rocks or tools.  Regularly check to see if handrails are loose or broken. Make sure that both sides of any steps have handrails.  Any raised decks and porches should have guardrails on the edges.  Have any leaves, snow, or ice cleared regularly.  Use sand or salt on walking paths during winter.  Clean up any spills in your garage right away. This includes oil or grease spills. What can I do in the bathroom?  Use night lights.  Install grab bars by the toilet and in the tub and shower. Do not use towel bars as grab bars.  Use non-skid mats or decals in the tub or shower.  If you need to sit down in the shower, use a plastic, non-slip stool.  Keep the floor dry. Clean up any  water that spills on the floor as soon as it happens.  Remove soap buildup in the tub or shower regularly.  Attach bath mats securely with double-sided non-slip rug tape.  Do not have throw rugs and other things on the floor that can make you trip. What can I do in the bedroom?  Use night lights.  Make sure that you have a light by your bed that is easy to reach.  Do not use any sheets or blankets that are too big for your bed. They should not hang down onto the floor.  Have a firm chair that has side arms. You can use this for support while you get dressed.  Do not have throw rugs and other things on the floor that can make you trip. What can I do in the kitchen?  Clean up any spills right away.  Avoid walking on wet floors.  Keep items that you use a lot in easy-to-reach places.  If you need to reach something above you, use a strong step stool that has a grab bar.  Keep electrical cords out of the way.  Do not use floor polish or wax that makes floors slippery. If you must use wax,  use non-skid floor wax.  Do not have throw rugs and other things on the floor that can make you trip. What can I do with my stairs?  Do not leave any items on the stairs.  Make sure that there are handrails on both sides of the stairs and use them. Fix handrails that are broken or loose. Make sure that handrails are as long as the stairways.  Check any carpeting to make sure that it is firmly attached to the stairs. Fix any carpet that is loose or worn.  Avoid having throw rugs at the top or bottom of the stairs. If you do have throw rugs, attach them to the floor with carpet tape.  Make sure that you have a light switch at the top of the stairs and the bottom of the stairs. If you do not have them, ask someone to add them for you. What else can I do to help prevent falls?  Wear shoes that:  Do not have high heels.  Have rubber bottoms.  Are comfortable and fit you well.  Are closed  at the toe. Do not wear sandals.  If you use a stepladder:  Make sure that it is fully opened. Do not climb a closed stepladder.  Make sure that both sides of the stepladder are locked into place.  Ask someone to hold it for you, if possible.  Clearly mark and make sure that you can see:  Any grab bars or handrails.  First and last steps.  Where the edge of each step is.  Use tools that help you move around (mobility aids) if they are needed. These include:  Canes.  Walkers.  Scooters.  Crutches.  Turn on the lights when you go into a dark area. Replace any light bulbs as soon as they burn out.  Set up your furniture so you have a clear path. Avoid moving your furniture around.  If any of your floors are uneven, fix them.  If there are any pets around you, be aware of where they are.  Review your medicines with your doctor. Some medicines can make you feel dizzy. This can increase your chance of falling. Ask your doctor what other things that you can do to help prevent falls. This information is not intended to replace advice given to you by your health care provider. Make sure you discuss any questions you have with your health care provider. Document Released: 01/26/2009 Document Revised: 09/07/2015 Document Reviewed: 05/06/2014 Elsevier Interactive Patient Education  2017 [employer].

## 2019-02-05 NOTE — Progress Notes (Signed)
PCP notes:  Health Maintenance: needs flu and prevnar 13 vaccines. Declined Shingrix.    Abnormal Screenings: none    Patient concerns: none    Nurse concerns: none   Next PCP appt.: 02/10/2019 @ 9 am

## 2019-02-10 ENCOUNTER — Encounter: Payer: Self-pay | Admitting: Primary Care

## 2019-02-10 ENCOUNTER — Other Ambulatory Visit: Payer: Self-pay

## 2019-02-10 ENCOUNTER — Ambulatory Visit (INDEPENDENT_AMBULATORY_CARE_PROVIDER_SITE_OTHER): Payer: Medicare Other | Admitting: Primary Care

## 2019-02-10 VITALS — BP 116/72 | HR 74 | Temp 97.6°F | Ht <= 58 in | Wt 150.0 lb

## 2019-02-10 DIAGNOSIS — T8579XA Infection and inflammatory reaction due to other internal prosthetic devices, implants and grafts, initial encounter: Secondary | ICD-10-CM | POA: Insufficient documentation

## 2019-02-10 DIAGNOSIS — I340 Nonrheumatic mitral (valve) insufficiency: Secondary | ICD-10-CM

## 2019-02-10 DIAGNOSIS — E785 Hyperlipidemia, unspecified: Secondary | ICD-10-CM | POA: Diagnosis not present

## 2019-02-10 DIAGNOSIS — T8579XS Infection and inflammatory reaction due to other internal prosthetic devices, implants and grafts, sequela: Secondary | ICD-10-CM

## 2019-02-10 DIAGNOSIS — Z79890 Hormone replacement therapy (postmenopausal): Secondary | ICD-10-CM

## 2019-02-10 DIAGNOSIS — K219 Gastro-esophageal reflux disease without esophagitis: Secondary | ICD-10-CM | POA: Diagnosis not present

## 2019-02-10 DIAGNOSIS — M19012 Primary osteoarthritis, left shoulder: Secondary | ICD-10-CM

## 2019-02-10 DIAGNOSIS — Z0000 Encounter for general adult medical examination without abnormal findings: Secondary | ICD-10-CM

## 2019-02-10 DIAGNOSIS — G8929 Other chronic pain: Secondary | ICD-10-CM

## 2019-02-10 DIAGNOSIS — M25561 Pain in right knee: Secondary | ICD-10-CM

## 2019-02-10 DIAGNOSIS — I341 Nonrheumatic mitral (valve) prolapse: Secondary | ICD-10-CM

## 2019-02-10 DIAGNOSIS — N289 Disorder of kidney and ureter, unspecified: Secondary | ICD-10-CM

## 2019-02-10 DIAGNOSIS — D649 Anemia, unspecified: Secondary | ICD-10-CM

## 2019-02-10 DIAGNOSIS — E039 Hypothyroidism, unspecified: Secondary | ICD-10-CM

## 2019-02-10 DIAGNOSIS — K601 Chronic anal fissure: Secondary | ICD-10-CM

## 2019-02-10 DIAGNOSIS — Z23 Encounter for immunization: Secondary | ICD-10-CM

## 2019-02-10 DIAGNOSIS — M25512 Pain in left shoulder: Secondary | ICD-10-CM

## 2019-02-10 DIAGNOSIS — M170 Bilateral primary osteoarthritis of knee: Secondary | ICD-10-CM

## 2019-02-10 MED ORDER — ZOSTER VAC RECOMB ADJUVANTED 50 MCG/0.5ML IM SUSR: 0.5000 mL | Freq: Once | INTRAMUSCULAR | 1 refills | Status: AC

## 2019-02-10 MED ORDER — CLOTRIMAZOLE-BETAMETHASONE 1-0.05 % EX CREA: 1.0000 "application " | TOPICAL_CREAM | Freq: Two times a day (BID) | CUTANEOUS | 0 refills | Status: DC | PRN

## 2019-02-10 MED ORDER — FENOFIBRATE MICRONIZED 134 MG PO CAPS: 134.0000 mg | ORAL_CAPSULE | Freq: Every day | ORAL | 3 refills | Status: DC

## 2019-02-10 MED ORDER — FAMOTIDINE 20 MG PO TABS: 20.0000 mg | ORAL_TABLET | Freq: Every day | ORAL | 3 refills | Status: DC

## 2019-02-10 MED ORDER — LEVOTHYROXINE SODIUM 50 MCG PO TABS: ORAL_TABLET | ORAL | 0 refills | Status: DC

## 2019-02-10 MED ORDER — ESTROGENS CONJUGATED 0.3 MG PO TABS: 0.3000 mg | ORAL_TABLET | ORAL | 0 refills | Status: DC

## 2019-02-10 NOTE — Assessment & Plan Note (Signed)
Recent CBC stable, no rectal or vaginal bleeding.

## 2019-02-10 NOTE — Assessment & Plan Note (Signed)
Doing well overall, using Lotrisone PRN, refill provided.

## 2019-02-10 NOTE — Patient Instructions (Signed)
Be sure to take your levothyroxine (thyroid medication) every morning on an empty stomach with water only. No food or other medications for 30 minutes. No heartburn medication, iron pills, calcium, vitamin D, or magnesium pills within four hours of taking levothyroxine.   Take the shingles vaccination to the pharmacy in one month or later.  Start exercising. You should be getting 150 minutes of exercise weekly.  Continue to work on a healthy diet.   Schedule a lab only appointment for 6 weeks to repeat thyroid function.  It was a pleasure to see you today!   Preventive Care 16 Years and Older, Female Preventive care refers to lifestyle choices and visits with your health care provider that can promote health and wellness. This includes:  A yearly physical exam. This is also called an annual well check.  Regular dental and eye exams.  Immunizations.  Screening for certain conditions.  Healthy lifestyle choices, such as diet and exercise. What can I expect for my preventive care visit? Physical exam Your health care provider will check:  Height and weight. These may be used to calculate body mass index (BMI), which is a measurement that tells if you are at a healthy weight.  Heart rate and blood pressure.  Your skin for abnormal spots. Counseling Your health care provider may ask you questions about:  Alcohol, tobacco, and drug use.  Emotional well-being.  Home and relationship well-being.  Sexual activity.  Eating habits.  History of falls.  Memory and ability to understand (cognition).  Work and work Statistician.  Pregnancy and menstrual history. What immunizations do I need?  Influenza (flu) vaccine  This is recommended every year. Tetanus, diphtheria, and pertussis (Tdap) vaccine  You may need a Td booster every 10 years. Varicella (chickenpox) vaccine  You may need this vaccine if you have not already been vaccinated. Zoster (shingles) vaccine   You may need this after age 55. Pneumococcal conjugate (PCV13) vaccine  One dose is recommended after age 45. Pneumococcal polysaccharide (PPSV23) vaccine  One dose is recommended after age 58. Measles, mumps, and rubella (MMR) vaccine  You may need at least one dose of MMR if you were born in 1957 or later. You may also need a second dose. Meningococcal conjugate (MenACWY) vaccine  You may need this if you have certain conditions. Hepatitis A vaccine  You may need this if you have certain conditions or if you travel or work in places where you may be exposed to hepatitis A. Hepatitis B vaccine  You may need this if you have certain conditions or if you travel or work in places where you may be exposed to hepatitis B. Haemophilus influenzae type b (Hib) vaccine  You may need this if you have certain conditions. You may receive vaccines as individual doses or as more than one vaccine together in one shot (combination vaccines). Talk with your health care provider about the risks and benefits of combination vaccines. What tests do I need? Blood tests  Lipid and cholesterol levels. These may be checked every 5 years, or more frequently depending on your overall health.  Hepatitis C test.  Hepatitis B test. Screening  Lung cancer screening. You may have this screening every year starting at age 14 if you have a 30-pack-year history of smoking and currently smoke or have quit within the past 15 years.  Colorectal cancer screening. All adults should have this screening starting at age 45 and continuing until age 58. Your health care provider may  recommend screening at age 63 if you are at increased risk. You will have tests every 1-10 years, depending on your results and the type of screening test.  Diabetes screening. This is done by checking your blood sugar (glucose) after you have not eaten for a while (fasting). You may have this done every 1-3 years.  Mammogram. This may be  done every 1-2 years. Talk with your health care provider about how often you should have regular mammograms.  BRCA-related cancer screening. This may be done if you have a family history of breast, ovarian, tubal, or peritoneal cancers. Other tests  Sexually transmitted disease (STD) testing.  Bone density scan. This is done to screen for osteoporosis. You may have this done starting at age 33. Follow these instructions at home: Eating and drinking  Eat a diet that includes fresh fruits and vegetables, whole grains, lean protein, and low-fat dairy products. Limit your intake of foods with high amounts of sugar, saturated fats, and salt.  Take vitamin and mineral supplements as recommended by your health care provider.  Do not drink alcohol if your health care provider tells you not to drink.  If you drink alcohol: ? Limit how much you have to 0-1 drink a day. ? Be aware of how much alcohol is in your drink. In the [place] one drink equals one 12 oz bottle of beer (355 mL), one 5 oz glass of wine (148 mL), or one 1 oz glass of hard liquor (44 mL). Lifestyle  Take daily care of your teeth and gums.  Stay active. Exercise for at least 30 minutes on 5 or more days each week.  Do not use any products that contain nicotine or tobacco, such as cigarettes, e-cigarettes, and chewing tobacco. If you need help quitting, ask your health care provider.  If you are sexually active, practice safe sex. Use a condom or other form of protection in order to prevent STIs (sexually transmitted infections).  Talk with your health care provider about taking a low-dose aspirin or statin. What's next?  Go to your health care provider once a year for a well check visit.  Ask your health care provider how often you should have your eyes and teeth checked.  Stay up to date on all vaccines. This information is not intended to replace advice given to you by your health care provider. Make sure you discuss  any questions you have with your health care provider. Document Released: 04/28/2015 Document Revised: 03/26/2018 Document Reviewed: 03/26/2018 Elsevier Patient Education  2020 [employer].

## 2019-02-10 NOTE — Assessment & Plan Note (Signed)
She is taking levothyroxine every morning with water without eating or taking other medications for 30 minutes. She does take some vitamins and calcium within four hours of levothyroxine.  Recent TSH of 4.99, has been on 50 mcg for years.  Will have her start taking the levothyroxine appropriately and repeat TSH in 4-6 weeks.

## 2019-02-10 NOTE — Assessment & Plan Note (Signed)
Pneumonia and influenza vaccinations due today. Rx for Shingrix provided. Mammogram UTD. Bone density UTD. No longer needing colon cancer screening. Encouraged regular exercise, healthy diet. Exam today stable. Labs reviewed.

## 2019-02-10 NOTE — Assessment & Plan Note (Signed)
Following with orthopedics, may undergo right knee replacement next year.

## 2019-02-10 NOTE — Assessment & Plan Note (Signed)
Compliant to fenofibrate daily.  Recent LDL and trigs at goal. Continue current regimen.

## 2019-02-10 NOTE — Assessment & Plan Note (Signed)
Following with cardiology, compliant to metoprolol and is taking 25 mg BID. Echo from 2018 reviewed and was without change. Continue current regimen.

## 2019-02-10 NOTE — Assessment & Plan Note (Signed)
Following with orthopedics, may undergo surgical intervention to left shoulder next year.

## 2019-02-10 NOTE — Addendum Note (Signed)
Addended by: Jacqualin Combes on: 02/10/2019 12:19 PM   Modules accepted: Orders

## 2019-02-10 NOTE — Assessment & Plan Note (Signed)
Doing well on PRN famotidine, continue same.

## 2019-02-10 NOTE — Progress Notes (Signed)
Subjective:    Patient ID: [name] [name], female    DOB: 16-May-1935, 83 y.o.   MRN: [id number]  HPI  [name] is an 83 year old female who presents today for complete physical.  Immunizations: -Tetanus: Completed in 2019 -Influenza: Due today -Shingles: Never completed -Pneumonia: Completed in 2019, only had Pneumovax  Diet: She endorses a healthy diet. She eats vegetables, fruit, whole grains, some protein. Desserts several times weekly.  Exercise: She is not exercising much since the gyms have been closed.   Eye exam: Completed in 2020 Dental exam: Completes semi-annually   Mammogram: Completed in August 2020 Dexa: Completed in 2019 Colonoscopy: Completed in 2010, no further testing needed.  BP Readings from Last 3 Encounters:  02/10/19 116/72  11/23/18 (!) 104/50  05/26/18 (!) 104/53      Review of Systems  Constitutional: Negative for unexpected weight change.  HENT: Negative for rhinorrhea.   Respiratory: Negative for cough and shortness of breath.   Cardiovascular: Negative for chest pain.  Gastrointestinal: Negative for constipation and diarrhea.  Genitourinary: Negative for difficulty urinating.  Musculoskeletal: Positive for arthralgias.  Skin: Negative for rash.  Allergic/Immunologic: Negative for environmental allergies.  Neurological: Negative for dizziness, numbness and headaches.  Psychiatric/Behavioral: The patient is not nervous/anxious.        Past Medical History:  Diagnosis Date  . Anxiety    prev on prozac  . Chest pain    Felt to be noncardiac in the past  . Chronic rectal fissure   . Colon polyp   . Dyslipidemia   . Ejection fraction    EF 60%, echo, 2007, atrial septum bows left to right compatible with increased left atrial pressure  . Fibromyalgia   . GERD (gastroesophageal reflux disease)    Barrett's esophagus  . Hiatal hernia   . Lumbar spine pain    Complex lumbar spine surgery at [facility], 0000000, complicated, MRSA, much of the  appliance is removed, patient on chronic antibiotics  . Melanoma ([place])   . Mitral regurgitation    Mild, 2007,  /  moderate or severe echo, September, 2012, eccentric jet wrapping the left atrium, consider TEE for further assessment  . Mitral valve prolapse    Mild mitral regurgitation echo, 2007  . Osteoarthritis   . Scoliosis   . Sinusitis    Multiple sinus operations in the past  . Statin intolerance    As of 2009, no further attempts to use statins  . Thyroid nodule      Social History   Socioeconomic History  . Marital status: Married    Spouse name: Not on file  . Number of children: Not on file  . Years of education: Not on file  . Highest education level: Not on file  Occupational History  . Not on file  Social Needs  . Financial resource strain: Not hard at all  . Food insecurity    Worry: Never true    Inability: Never true  . Transportation needs    Medical: No    Non-medical: No  Tobacco Use  . Smoking status: Never Smoker  . Smokeless tobacco: Never Used  Substance and Sexual Activity  . Alcohol use: No  . Drug use: No  . Sexual activity: Not on file  Lifestyle  . Physical activity    Days per week: 0 days    Minutes per session: 0 min  . Stress: Not at all  Relationships  . Social connections  Talks on phone: Not on file    Gets together: Not on file    Attends religious service: Not on file    Active member of club or organization: Not on file    Attends meetings of clubs or organizations: Not on file    Relationship status: Not on file  . Intimate partner violence    Fear of current or ex partner: No    Emotionally abused: No    Physically abused: No    Forced sexual activity: No  Other Topics Concern  . Not on file  Social History Narrative   From [place]   Married (second [phone]   3 kids, 2 of whom are local   Retired, Camera operator    Past Surgical History:  Procedure Laterality Date  . ABDOMINAL HYSTERECTOMY    .  BACK SURGERY    . CHOLECYSTECTOMY    . FOOT SURGERY    . HERNIA REPAIR    . NASAL SINUS SURGERY      Family History  Problem Relation Age of Onset  . GER disease Mother   . Dementia Mother   . Breast cancer Mother   . Heart disease Father        MI at 3  . Scoliosis Daughter   . Colon cancer Neg Hx     Allergies  Allergen Reactions  . Other Other (See Comments)    Other Reaction: when taking for an extended period of time causes mouth ulcers and esophagus irritation Other Reaction: when taking for an extended period of time causes mouth ulcers and esophagus irritation   . Fenofibric Acid Other (See Comments)    GI upset  . Nsaids     REACTION: No long term use  . Statins Other (See Comments)    Intolerant, myalgias Intolerant, myalgias Intolerant, myalgias Intolerant, myalgias Intolerant, myalgias Intolerant, myalgias   . Trilipix [Choline Fenofibrate]     GI upset  . Wasp Venom Swelling    Current Outpatient Medications on File Prior to Visit  Medication Sig Dispense Refill  . ALPHA LIPOIC ACID PO Take 1 tablet by mouth daily. Daily     . Biotin 10 MG TABS Take 1 tablet by mouth daily. Daily     . calcium carbonate (OS-CAL) 600 MG TABS Take 1,200 mg by mouth 2 (two) times daily with a meal.    . cholecalciferol (VITAMIN D) 1000 UNITS tablet Take 1,000 Units by mouth daily.    . Diclofenac Sodium (PENNSAID) 2 % SOLN PLACE 2 PUMP ONTO THE SKIN 2 (TWO) TIMES DAILY. 112 g 1  . diclofenac sodium (VOLTAREN) 1 % GEL Apply 2-4 grams to affected area up to 4 times daily 4 Tube 2  . estrogens, conjugated, (PREMARIN) 0.3 MG tablet Take 1 tablet (0.3 mg total) by mouth every other day. 90 tablet 0  . fish oil-omega-3 fatty acids 1000 MG capsule Take 2 g by mouth daily.    Marland Kitchen gabapentin (NEURONTIN) 100 MG capsule TAKE 1 CAPSULE BY MOUTH THREE TIMES A DAY 270 capsule 1  . Ginger, Zingiber officinalis, (GINGER PO) Take 1 tablet by mouth daily.    . metoprolol tartrate  (LOPRESSOR) 50 MG tablet TAKE 1 TABLET ( 50 MG TOTAL) BY MOUTH IN THE MORNING AND 1/2 TABLET BY MOUTH IN THE EVENING. Please keep upcoming appt in March. Thank you 135 tablet 3  . NON FORMULARY Take 1 capsule by mouth daily. Tumeric      Daily     .  sulfamethoxazole-trimethoprim (BACTRIM DS) 800-160 MG per tablet Take 1 tablet by mouth 2 (two) times daily.     Marland Kitchen tretinoin (RETIN-A) 0.05 % cream Apply 1 application topically daily as needed (as directed per dermatologist).   3   No current facility-administered medications on file prior to visit.     BP 116/72   Pulse 74   Temp 97.6 F (36.4 C) (Temporal)   Ht 4\' 9"  (1.448 m)   Wt 150 lb (68 kg)   SpO2 95%   BMI 32.46 kg/m    Objective:   Physical Exam  Constitutional: She is oriented to person, place, and time. She appears well-nourished.  HENT:  Right Ear: Tympanic membrane and ear canal normal.  Left Ear: Tympanic membrane and ear canal normal.  Mouth/Throat: Oropharynx is clear and moist.  Eyes: Pupils are equal, round, and reactive to light. EOM are normal.  Neck: Neck supple.  Cardiovascular: Normal rate and regular rhythm.  Respiratory: Effort normal and breath sounds normal.  GI: Soft. Bowel sounds are normal. There is no abdominal tenderness.  Musculoskeletal:     Comments: Generalized decrease in ROM, ambulating with cane  Neurological: She is alert and oriented to person, place, and time.  Skin: Skin is warm and dry.  Psychiatric: She has a normal mood and affect.           Assessment & Plan:

## 2019-02-10 NOTE — Assessment & Plan Note (Addendum)
Continues to remain on Premarin every other day.  Discussed risks of continuing HRT at her age (CVD and breast cancer) and she cannot tolerate being without HRT. Mammogram from August 2020 unremarkable. LDL at goal.  Will continue Premarin every other day for now.

## 2019-02-10 NOTE — Assessment & Plan Note (Signed)
Stable, slightly improved from last year. Avoid oral NSAID's, work on hydration. BP cannot tolerate ACE/ARB.

## 2019-02-10 NOTE — Assessment & Plan Note (Signed)
Following with infectious disease through [place], managed on daily Bactrim for prevention of MRSA.

## 2019-02-10 NOTE — Assessment & Plan Note (Signed)
Following with cardiology, complaint to metoprolol. Last echo from 2018 without change.

## 2019-02-10 NOTE — Assessment & Plan Note (Addendum)
Chronic, following with orthopedics and may undergo surgical intervention next year. Continue gabapentin.

## 2019-02-10 NOTE — Assessment & Plan Note (Addendum)
Following with orthopedics, mostly using Pensaid, sometimes diclofenac. Continue gabapentin.

## 2019-02-17 ENCOUNTER — Encounter: Payer: Self-pay | Admitting: Radiology

## 2019-02-17 NOTE — Progress Notes (Unsigned)
PA request faxed to [facility] today for Synvisc One.   Buy and bill ok.  [id number] covered at 100% after a $94 copay for each- so patient will need to pay $188 in copays on date of service.   Will follow up on PA.

## 2019-02-19 NOTE — Progress Notes (Signed)
[employer] to f/u on PA request (primary policy) and s/w [place].  No PA needed for [id number] if given in OP office setting and provider is participating with [place].  Ref # [id number].    Secondary policy BCBS PA request faxed in today.

## 2019-02-24 ENCOUNTER — Other Ambulatory Visit (INDEPENDENT_AMBULATORY_CARE_PROVIDER_SITE_OTHER): Payer: Self-pay | Admitting: Orthopaedic Surgery

## 2019-02-24 ENCOUNTER — Ambulatory Visit (INDEPENDENT_AMBULATORY_CARE_PROVIDER_SITE_OTHER): Payer: Medicare Other | Admitting: Orthopaedic Surgery

## 2019-02-24 ENCOUNTER — Encounter: Payer: Self-pay | Admitting: Orthopaedic Surgery

## 2019-02-24 ENCOUNTER — Other Ambulatory Visit: Payer: Self-pay

## 2019-02-24 DIAGNOSIS — M1711 Unilateral primary osteoarthritis, right knee: Secondary | ICD-10-CM

## 2019-02-24 DIAGNOSIS — M1712 Unilateral primary osteoarthritis, left knee: Secondary | ICD-10-CM | POA: Diagnosis not present

## 2019-02-24 MED ORDER — HYLAN G-F 20 48 MG/6ML IX SOSY
48.0000 mg | PREFILLED_SYRINGE | INTRA_ARTICULAR | Status: AC | PRN
  Administered 2019-02-24: 48 mg via INTRA_ARTICULAR

## 2019-02-24 NOTE — Telephone Encounter (Signed)
Ok to fill 

## 2019-02-24 NOTE — Progress Notes (Signed)
   Procedure Note  Patient: [name] [name]             Date of Birth: [birth year]           MRN: [id number]             Visit Date: 02/24/2019  Procedures: Visit Diagnoses:  1. Unilateral primary osteoarthritis, right knee   2. Unilateral primary osteoarthritis, left knee     Large Joint Inj: R knee on 02/24/2019 9:01 AM Indications: pain and diagnostic evaluation Details: 22 G 1.5 in needle, superolateral approach  Arthrogram: No  Medications: 48 mg Hylan 48 MG/6ML Outcome: tolerated well, no immediate complications Procedure, treatment alternatives, risks and benefits explained, specific risks discussed. Consent was given by the patient. Immediately prior to procedure a time out was called to verify the correct patient, procedure, equipment, support staff and site/side marked as required. Patient was prepped and draped in the usual sterile fashion.   Large Joint Inj: L knee on 02/24/2019 9:01 AM Indications: pain and diagnostic evaluation Details: 22 G 1.5 in needle, superolateral approach  Arthrogram: No  Medications: 48 mg Hylan 48 MG/6ML Outcome: tolerated well, no immediate complications Procedure, treatment alternatives, risks and benefits explained, specific risks discussed. Consent was given by the patient. Immediately prior to procedure a time out was called to verify the correct patient, procedure, equipment, support staff and site/side marked as required. Patient was prepped and draped in the usual sterile fashion.    The patient comes in today for scheduled hyaluronic acid injection with Synvisc 1 in each knee to treat the pain from osteoarthritis.  She has had these in the past and this is been most successful for her.  She does have a history of MRSA so she would rather not have knee replacements.  She is 83 years old.  Her knee pain is daily and is detriment affecting her quality of life and her mobility.  She has had no other acute change in her medical status.  She has  tried and failed other conservative treatments including steroid injections.  Examination of both knees today there is no effusion with good range of motion.  Both knees are ligamentously stable but have global tenderness.  I did place Synvisc 1 in both knees today without difficulty.  All questions concerns were answered and addressed.  Follow-up will be as needed.  She knows that she can get repeat injections in 6 months from now if needed.

## 2019-02-24 NOTE — Progress Notes (Signed)
Patient seen and injected today.

## 2019-02-24 NOTE — Progress Notes (Signed)
Patient will have $94 copay x 2.   Secondary BCBS PA [id number], 96 units Synvisc One, valid 02/17/19 thru 02/17/20. For info on primary see below.

## 2019-03-02 ENCOUNTER — Encounter: Payer: Self-pay | Admitting: Gastroenterology

## 2019-03-17 ENCOUNTER — Other Ambulatory Visit: Payer: Self-pay | Admitting: Primary Care

## 2019-03-17 DIAGNOSIS — E039 Hypothyroidism, unspecified: Secondary | ICD-10-CM

## 2019-03-22 ENCOUNTER — Telehealth: Payer: Self-pay

## 2019-03-22 NOTE — Telephone Encounter (Signed)
LVM w COVID screen and back lab info 12.7.2020 TLJ

## 2019-03-23 ENCOUNTER — Other Ambulatory Visit: Payer: Self-pay

## 2019-03-23 ENCOUNTER — Other Ambulatory Visit (INDEPENDENT_AMBULATORY_CARE_PROVIDER_SITE_OTHER): Payer: Medicare Other

## 2019-03-23 DIAGNOSIS — E039 Hypothyroidism, unspecified: Secondary | ICD-10-CM | POA: Diagnosis not present

## 2019-03-23 LAB — TSH: TSH: 3.96 u[IU]/mL (ref 0.35–4.50)

## 2019-03-29 DIAGNOSIS — T8579XD Infection and inflammatory reaction due to other internal prosthetic devices, implants and grafts, subsequent encounter: Secondary | ICD-10-CM | POA: Diagnosis not present

## 2019-03-29 DIAGNOSIS — B9562 Methicillin resistant Staphylococcus aureus infection as the cause of diseases classified elsewhere: Secondary | ICD-10-CM | POA: Diagnosis not present

## 2019-03-29 DIAGNOSIS — I059 Rheumatic mitral valve disease, unspecified: Secondary | ICD-10-CM | POA: Diagnosis not present

## 2019-03-30 ENCOUNTER — Encounter: Payer: Self-pay | Admitting: Family Medicine

## 2019-03-30 ENCOUNTER — Other Ambulatory Visit: Payer: Self-pay

## 2019-03-30 ENCOUNTER — Ambulatory Visit (INDEPENDENT_AMBULATORY_CARE_PROVIDER_SITE_OTHER): Payer: Medicare Other | Admitting: Family Medicine

## 2019-03-30 VITALS — BP 104/66 | HR 97 | Temp 97.2°F | Ht <= 58 in | Wt 152.0 lb

## 2019-03-30 DIAGNOSIS — T8579XS Infection and inflammatory reaction due to other internal prosthetic devices, implants and grafts, sequela: Secondary | ICD-10-CM

## 2019-03-30 DIAGNOSIS — R300 Dysuria: Secondary | ICD-10-CM | POA: Insufficient documentation

## 2019-03-30 DIAGNOSIS — Z792 Long term (current) use of antibiotics: Secondary | ICD-10-CM | POA: Diagnosis not present

## 2019-03-30 LAB — POC URINALSYSI DIPSTICK (AUTOMATED)
Bilirubin, UA: NEGATIVE
Blood, UA: NEGATIVE
Glucose, UA: NEGATIVE
Ketones, UA: NEGATIVE
Nitrite, UA: NEGATIVE
Protein, UA: NEGATIVE
Spec Grav, UA: >=1.03 — AB (ref 1.010–1.025)
Urobilinogen, UA: 0.2 E.U./dL
pH, UA: 6 (ref 5.0–8.0)

## 2019-03-30 NOTE — Assessment & Plan Note (Signed)
Lumbar hardware infected, suppression with bactrim chronically. No acute signs today.

## 2019-03-30 NOTE — Assessment & Plan Note (Signed)
Stable, Bactrim for infected hardware. Follows with ID

## 2019-03-30 NOTE — Addendum Note (Signed)
Addended by: Pilar Grammes on: 03/30/2019 03:38 PM   Modules accepted: Orders

## 2019-03-30 NOTE — Patient Instructions (Addendum)
Urine symptoms - we will wait to get the urine culture - will send results via MyChart - Hopefully being able to choose the right antibiotic - call if miserable tomorrow and the results

## 2019-03-30 NOTE — Progress Notes (Signed)
Subjective:     [name] [name] is a 83 y.o. female presenting for Possible UTI Symptoms (Bladder pressure, urinary frequency and urgency started a few days ago. Taking AZO Standard. Used to get them alot in the past.Takes Bactrim twice a day for MRSA.)     HPI  #Dysuria - Urine culture in outside - resistant to Ampicillin, Augmentin, Keflex - susceptible to Cipro and intermediate for Nitrofurantoin - long-term use of Bactrim - small volume urination - difficulty emptying the bladder - no blood in the urine - Azo standard is helping with the pain - Started a few days ago  Review of Systems  Constitutional: Negative for chills and fever.  Gastrointestinal: Positive for abdominal pain (pressure). Negative for nausea and vomiting.  Genitourinary: Positive for difficulty urinating, dysuria, frequency and urgency. Negative for flank pain, vaginal discharge and vaginal pain.     Social History   Tobacco Use  Smoking Status Never Smoker  Smokeless Tobacco Never Used        Objective:    BP Readings from Last 3 Encounters:  03/30/19 104/66  02/10/19 116/72  11/23/18 (!) 104/50   Wt Readings from Last 3 Encounters:  03/30/19 152 lb (68.9 kg)  02/10/19 150 lb (68 kg)  11/23/18 146 lb 8 oz (66.5 kg)    BP 104/66 (BP Location: Left Arm, Patient Position: Sitting, Cuff Size: Large)   Pulse 97   Temp (!) 97.2 F (36.2 C)   Ht 4\' 9"  (1.448 m)   Wt 152 lb (68.9 kg)   SpO2 96%   BMI 32.89 kg/m    Physical Exam Constitutional:      General: She is not in acute distress.    Appearance: She is well-developed. She is not diaphoretic.  HENT:     Head: Normocephalic and atraumatic.  Eyes:     Conjunctiva/sclera: Conjunctivae normal.  Cardiovascular:     Rate and Rhythm: Normal rate and regular rhythm.     Heart sounds: Normal heart sounds.  Pulmonary:     Effort: Pulmonary effort is normal.  Abdominal:     General: Bowel sounds are normal. There is no distension.    Palpations: Abdomen is soft.     Tenderness: There is no abdominal tenderness. There is no guarding.  Musculoskeletal:     Cervical back: Neck supple.  Skin:    General: Skin is warm and dry.  Neurological:     Mental Status: She is alert.      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 No acute signs today.       Long term current use of antibiotics    Stable, Bactrim for infected hardware. Follows with ID      Dysuria   Relevant Orders   Urine Culture     Given her long term use of bactrim and hx of resistant UTIs, discussed and advised waiting for culture in light of minimally positive LE and no nitrites. Suspect that it will be resistant to several abx given hx of chronic use, however, would ideally not use Cipro given risk if there is a more narrow spectrum option. Cont Azo  for symptom management.   Return if symptoms worsen or fail to improve.  Lesleigh Noe, MD

## 2019-04-01 ENCOUNTER — Encounter: Payer: Self-pay | Admitting: Family Medicine

## 2019-04-01 LAB — URINE CULTURE
MICRO NUMBER:: 1199391
SPECIMEN QUALITY:: ADEQUATE

## 2019-04-02 NOTE — Telephone Encounter (Signed)
Left message for patient to call back Need to schedule appointment with Anda Kraft

## 2019-04-02 NOTE — Telephone Encounter (Signed)
Sending reply to both providers.

## 2019-04-06 ENCOUNTER — Ambulatory Visit: Payer: Medicare Other | Admitting: Primary Care

## 2019-04-07 ENCOUNTER — Ambulatory Visit (INDEPENDENT_AMBULATORY_CARE_PROVIDER_SITE_OTHER): Payer: Medicare Other | Admitting: Primary Care

## 2019-04-07 ENCOUNTER — Other Ambulatory Visit: Payer: Self-pay

## 2019-04-07 VITALS — BP 116/70 | HR 80 | Temp 95.2°F | Ht <= 58 in | Wt 152.5 lb

## 2019-04-07 DIAGNOSIS — R300 Dysuria: Secondary | ICD-10-CM | POA: Diagnosis not present

## 2019-04-07 LAB — POC URINALSYSI DIPSTICK (AUTOMATED)
Glucose, UA: NEGATIVE
Ketones, UA: NEGATIVE
Nitrite, UA: POSITIVE
Protein, UA: POSITIVE — AB
Spec Grav, UA: 1.025 (ref 1.010–1.025)
Urobilinogen, UA: 4 E.U./dL — AB
pH, UA: 5 (ref 5.0–8.0)

## 2019-04-07 MED ORDER — CEPHALEXIN 500 MG PO CAPS: 500.0000 mg | ORAL_CAPSULE | Freq: Two times a day (BID) | ORAL | 0 refills | Status: DC

## 2019-04-07 NOTE — Progress Notes (Signed)
   Subjective:    Patient ID: [name] [name], female    DOB: 1935-11-29, 83 y.o.   MRN: [id number]  HPI [name] [name] is an 83 year old female with a history of hyperlipidemia, hypothyroidism, osteoarthritis, tachycardia/MVP/MR, decreased renal function, HRT, fibromyalgia, resistant UTIs and chronic Bactrim use secondary to infected lumbar hardware, who presents today with a chief complaint of dysuria.    She was last seen by Dr. Einar Pheasant on 03/30/2019 for dysuria. Her urine culture was negative. She was advised to take Azo Standard for symptom management.   Since then, her symptoms of dysuria, vaginal pressure, burning, and urinary urgency have persisted without improvement. She is taking Azo Standard 2 tablets three times a day, which provides temporary relief, however, her symptoms quickly return. Her last dose was last night at 6pm. She is also drinking cranberry juice.   She denies hematuria, vaginal discharge, fever, or chills. She does have chronic mild lower back pain, which she states is unchanged.    Review of Systems  Constitutional: Negative for chills and fever.  Genitourinary: Positive for dysuria, pelvic pain and urgency. Negative for flank pain, hematuria and vaginal discharge.       See HPI  Musculoskeletal: Positive for back pain.  Neurological: Negative for weakness.       Objective:   Physical Exam Cardiovascular:     Rate and Rhythm: Normal rate and regular rhythm.     Heart sounds: Normal heart sounds.  Pulmonary:     Effort: Pulmonary effort is normal.     Breath sounds: Normal breath sounds.  Abdominal:     Tenderness: There is abdominal tenderness in the suprapubic area. There is no right CVA tenderness or left CVA tenderness.  Neurological:     Mental Status: She is alert and oriented to person, place, and time.           Assessment & Plan:

## 2019-04-07 NOTE — Progress Notes (Signed)
Subjective:    Patient ID: [name] [name], female    DOB: 09/05/1935, 83 y.o.   MRN: [id number]  HPI  [name] [name] is a 83 year old female with a history of dyslipidemia, fibromyalgia, osteoarthritis, hypothyroidism who presents today with a chief complaint of pelvic pressure.  She was last evaluated by Dr. Einar Pheasant on 03/30/19 for symptoms of urinary frequency, urgency, pelvic pressure that began several days prior. She'd been taking AZO, also on Bactrim BID for which she takes for MRSA. During her visit on the 03/30/19, urine culture was negative and was   Since then she's continued to notice pelvic pressure, urinary urgency, urinary frequency. She's drinking cranberry juice and taking AZO with temporary relief.   Review of Systems  Constitutional: Negative for fever.  Gastrointestinal: Negative for abdominal pain.  Genitourinary: Positive for dysuria, frequency and urgency. Negative for hematuria and vaginal discharge.       Past Medical History:  Diagnosis Date  . Anxiety    prev on prozac  . Chest pain    Felt to be noncardiac in the past  . Chronic rectal fissure   . Colon polyp   . Dyslipidemia   . Ejection fraction    EF 60%, echo, 2007, atrial septum bows left to right compatible with increased left atrial pressure  . Fibromyalgia   . GERD (gastroesophageal reflux disease)    Barrett's esophagus  . Hiatal hernia   . Lumbar spine pain    Complex lumbar spine surgery at [facility], 0000000, complicated, MRSA, much of the appliance is removed, patient on chronic antibiotics  . Melanoma ([place])   . Mitral regurgitation    Mild, 2007,  /  moderate or severe echo, September, 2012, eccentric jet wrapping the left atrium, consider TEE for further assessment  . Mitral valve prolapse    Mild mitral regurgitation echo, 2007  . Osteoarthritis   . Scoliosis   . Sinusitis    Multiple sinus operations in the past  . Statin intolerance    As of 2009, no further attempts to use statins  . Thyroid  nodule      Social History   Socioeconomic History  . Marital status: Married    Spouse name: Not on file  . Number of children: Not on file  . Years of education: Not on file  . Highest education level: Not on file  Occupational History  . Not on file  Tobacco Use  . Smoking status: Never Smoker  . Smokeless tobacco: Never Used  Substance and Sexual Activity  . Alcohol use: No  . Drug use: No  . Sexual activity: Not on file  Other Topics Concern  . Not on file  Social History Narrative   From [place]   Married (second [phone]   3 kids, 2 of whom are local   Retired, Camera operator   Social Determinants of Health   Financial Resource Strain: [place]   . Difficulty of Paying Living Expenses: Not hard at all  Food Insecurity: No Food Insecurity  . Worried About Charity fundraiser in the Last Year: Never true  . Ran Out of Food in the Last Year: Never true  Transportation Needs: No Transportation Needs  . Lack of Transportation (Medical): No  . Lack of Transportation (Non-Medical): No  Physical Activity: Inactive  . Days of Exercise per Week: 0 days  . Minutes of Exercise per Session: 0 min  Stress: No Stress Concern Present  .  Feeling of Stress : Not at all  Social Connections:   . Frequency of Communication with Friends and Family: Not on file  . Frequency of Social Gatherings with Friends and Family: Not on file  . Attends Religious Services: Not on file  . Active Member of Clubs or Organizations: Not on file  . Attends Archivist Meetings: Not on file  . Marital Status: Not on file  Intimate Partner Violence: Not At Risk  . Fear of Current or Ex-Partner: No  . Emotionally Abused: No  . Physically Abused: No  . Sexually Abused: No    Past Surgical History:  Procedure Laterality Date  . ABDOMINAL HYSTERECTOMY    . BACK SURGERY    . CHOLECYSTECTOMY    . FOOT SURGERY    . HERNIA REPAIR    . NASAL SINUS SURGERY      Family History    Problem Relation Age of Onset  . GER disease Mother   . Dementia Mother   . Breast cancer Mother   . Heart disease Father        MI at 49  . Scoliosis Daughter   . Colon cancer Neg Hx     Allergies  Allergen Reactions  . Other Other (See Comments)    Other Reaction: when taking for an extended period of time causes mouth ulcers and esophagus irritation Other Reaction: when taking for an extended period of time causes mouth ulcers and esophagus irritation   . Fenofibric Acid Other (See Comments)    GI upset  . Nsaids     REACTION: No long term use  . Statins Other (See Comments)    Intolerant, myalgias Intolerant, myalgias Intolerant, myalgias Intolerant, myalgias Intolerant, myalgias Intolerant, myalgias   . Trilipix [Choline Fenofibrate]     GI upset  . Wasp Venom Swelling    Current Outpatient Medications on File Prior to Visit  Medication Sig Dispense Refill  . ALPHA LIPOIC ACID PO Take 1 tablet by mouth daily. Daily     . Biotin 10 MG TABS Take 1 tablet by mouth daily. Daily     . calcium carbonate (OS-CAL) 600 MG TABS Take 1,200 mg by mouth 2 (two) times daily with a meal.    . cholecalciferol (VITAMIN D) 1000 UNITS tablet Take 1,000 Units by mouth daily.    . clotrimazole-betamethasone (LOTRISONE) cream Apply 1 application topically 2 (two) times daily as needed. 30 g 0  . Diclofenac Sodium (PENNSAID) 2 % SOLN PLACE 2 PUMP ONTO THE SKIN 2 (TWO) TIMES DAILY. 112 g 1  . diclofenac sodium (VOLTAREN) 1 % GEL Apply 2-4 grams to affected area up to 4 times daily 4 Tube 2  . estrogens, conjugated, (PREMARIN) 0.3 MG tablet Take 1 tablet (0.3 mg total) by mouth every other day. 90 tablet 0  . famotidine (PEPCID) 20 MG tablet Take 1 tablet (20 mg total) by mouth daily. for heartburn. 90 tablet 3  . fenofibrate micronized (LOFIBRA) 134 MG capsule Take 1 capsule (134 mg total) by mouth at bedtime. For cholesterol. 90 capsule 3  . fish oil-omega-3 fatty acids 1000 MG capsule  Take 2 g by mouth daily.    Marland Kitchen gabapentin (NEURONTIN) 100 MG capsule TAKE 1 CAPSULE BY MOUTH THREE TIMES A DAY 270 capsule 1  . Ginger, Zingiber officinalis, (GINGER PO) Take 1 tablet by mouth daily.    Marland Kitchen levothyroxine (SYNTHROID) 50 MCG tablet Take 1 tablet by mouth every morning on an empty stomach with  water only.  No food or other medications for 30 minutes. 90 tablet 0  . metoprolol tartrate (LOPRESSOR) 50 MG tablet TAKE 1 TABLET ( 50 MG TOTAL) BY MOUTH IN THE MORNING AND 1/2 TABLET BY MOUTH IN THE EVENING. Please keep upcoming appt in March. Thank you 135 tablet 3  . NON FORMULARY Take 1 capsule by mouth daily. Tumeric      Daily     . sulfamethoxazole-trimethoprim (BACTRIM DS) 800-160 MG per tablet Take 1 tablet by mouth 2 (two) times daily.     Marland Kitchen tretinoin (RETIN-A) 0.05 % cream Apply 1 application topically daily as needed (as directed per dermatologist).   3   No current facility-administered medications on file prior to visit.    BP 116/70   Pulse 80   Temp (!) 95.2 F (35.1 C) (Temporal)   Ht 4\' 9"  (1.448 m)   Wt 152 lb 8 oz (69.2 kg)   SpO2 95%   BMI 33.00 kg/m    Objective:   Physical Exam  Constitutional: She appears well-nourished.  Cardiovascular: Normal rate and regular rhythm.  Respiratory: Effort normal and breath sounds normal.  Musculoskeletal:     Cervical back: Neck supple.  Skin: Skin is warm and dry.  Psychiatric: She has a normal mood and affect.           Assessment & Plan:

## 2019-04-07 NOTE — Assessment & Plan Note (Addendum)
Continued symptoms despite treatment with Azo. UA today with 2+ Leuks and +blood.   Given continued symptoms and history of chronic Bactrim use/resistant UTIs, will send urine culture and treat today with Keflex 500 mg bid x 7 days, unless culture indicates otherwise.   Follow-up pending urine culture results.   Agree with plan, [name], NP

## 2019-04-07 NOTE — Patient Instructions (Addendum)
Start taking Cephalexin (Keflex) 500 mg capsule twice daily for urinary tract infection.   Continue taking Azo for your symptoms as well.   I will contact regarding the results of your urine culture.   Ensure you are consuming 64 ounces of water daily.  It was a pleasure to see you today!

## 2019-04-09 LAB — URINE CULTURE
MICRO NUMBER:: 1226803
SPECIMEN QUALITY:: ADEQUATE

## 2019-04-21 ENCOUNTER — Other Ambulatory Visit: Payer: Self-pay

## 2019-04-21 ENCOUNTER — Ambulatory Visit (INDEPENDENT_AMBULATORY_CARE_PROVIDER_SITE_OTHER): Payer: Medicare Other | Admitting: Primary Care

## 2019-04-21 ENCOUNTER — Encounter: Payer: Self-pay | Admitting: Primary Care

## 2019-04-21 VITALS — BP 110/70 | HR 75 | Temp 97.6°F | Ht <= 58 in | Wt 153.2 lb

## 2019-04-21 DIAGNOSIS — R519 Headache, unspecified: Secondary | ICD-10-CM | POA: Insufficient documentation

## 2019-04-21 DIAGNOSIS — R300 Dysuria: Secondary | ICD-10-CM

## 2019-04-21 DIAGNOSIS — G8929 Other chronic pain: Secondary | ICD-10-CM | POA: Insufficient documentation

## 2019-04-21 LAB — POC URINALSYSI DIPSTICK (AUTOMATED)
Blood, UA: NEGATIVE
Glucose, UA: NEGATIVE
Nitrite, UA: POSITIVE
Protein, UA: POSITIVE — AB
Spec Grav, UA: >=1.03 — AB (ref 1.010–1.025)
Urobilinogen, UA: 0.2 E.U./dL
pH, UA: 5.5 (ref 5.0–8.0)

## 2019-04-21 MED ORDER — TOPIRAMATE 25 MG PO TABS: 25.0000 mg | ORAL_TABLET | Freq: Every day | ORAL | 0 refills | Status: DC

## 2019-04-21 NOTE — Assessment & Plan Note (Signed)
Chronic for years, never treated. Neuro exam today unremarkable.  Discussed options for treatment, would avoid amitriptyline given age. Avoid propranolol given use of metoprolol. Trial of topiramate 25 mg HS sent to pharmacy. She will update in 2 weeks.

## 2019-04-21 NOTE — Patient Instructions (Signed)
Start topiramate (Topamax) 25 mg once nightly for headache prevention. Please update me in a few weeks.  We will be in touch regarding your urine culture.   It was a pleasure to see you today!

## 2019-04-21 NOTE — Assessment & Plan Note (Signed)
Urine culture from visit on 04/07/19 with E coli, resistant to Bactrim DS. Did well on cephalexin until antibiotic was complete.  UA today with trace leuks, positive nitrites (AZO use recently), no blood. Culture sent.  Await results.

## 2019-04-21 NOTE — Progress Notes (Signed)
Subjective:    Patient ID: [name] [name], female    DOB: 1935-10-10, 84 y.o.   MRN: [id number]  HPI   This visit occurred during the SARS-CoV-2 public health emergency.  Safety protocols were in place, including screening questions prior to the visit, additional usage of staff PPE, and extensive cleaning of exam room while observing appropriate contact time as indicated for disinfecting solutions.   [name] [name] is an 84 year old female with a history of recurrent cystitis, chronic renal fissure, chronic back pain, long term use of antibiotics, hypothyroidism who presents today with a chief complaint of dysuria. She would also like to discuss chronic headaches.  1) Dysuria: She was last evaluated on 04/07/19 for symptoms dysuria/vaginal pressure/burning/urinary urgency. She is managed on Bactrim DS daily UTI prevention. UA during her last visit with 2+ leuks, positive nitrites (AZO use), trace blood. Culture returned positive with E coli, resistant to Bactrim DS. She was initiated on cephalexin on 04/07/19.  Since her last visit she initially felt better while she was on her cephalexin. About 3-4 days ago she started noticing pelvic pressure, dysuria, vaginal burning, urinary frequency mostly at night.  She denies hematuria, vaginal discharge. She's taken a few doses of AZO, no use in 36 hours. She is compliant to Premarin every other day.   2) Chronic Headaches: Chronic for years, worse over the last one year, which occur several times weekly. Headaches occur sporadically, sometimes when she leans back to apply pressure on her head. Headaches occur in different places including occipital, parietal, temporal, and frontal lobes. She's tried Tylenol without improvement. She has an allergy to NSAID's.    History of sinus surgery 3-4 years ago. She denies photophobia, phonophobia, nausea.   Review of Systems  Eyes: Negative for photophobia.  Genitourinary: Positive for dysuria and frequency. Negative  for vaginal discharge.  Neurological: Positive for headaches.       Past Medical History:  Diagnosis Date  . Anxiety    prev on prozac  . Chest pain    Felt to be noncardiac in the past  . Chronic rectal fissure   . Colon polyp   . Dyslipidemia   . Ejection fraction    EF 60%, echo, 2007, atrial septum bows left to right compatible with increased left atrial pressure  . Fibromyalgia   . GERD (gastroesophageal reflux disease)    Barrett's esophagus  . Hiatal hernia   . Lumbar spine pain    Complex lumbar spine surgery at [facility], 0000000, complicated, MRSA, much of the appliance is removed, patient on chronic antibiotics  . Melanoma ([place])   . Mitral regurgitation    Mild, 2007,  /  moderate or severe echo, September, 2012, eccentric jet wrapping the left atrium, consider TEE for further assessment  . Mitral valve prolapse    Mild mitral regurgitation echo, 2007  . Osteoarthritis   . Scoliosis   . Sinusitis    Multiple sinus operations in the past  . Statin intolerance    As of 2009, no further attempts to use statins  . Thyroid nodule      Social History   Socioeconomic History  . Marital status: Married    Spouse name: Not on file  . Number of children: Not on file  . Years of education: Not on file  . Highest education level: Not on file  Occupational History  . Not on file  Tobacco Use  . Smoking status: Never Smoker  . Smokeless tobacco:  Never Used  Substance and Sexual Activity  . Alcohol use: No  . Drug use: No  . Sexual activity: Not on file  Other Topics Concern  . Not on file  Social History Narrative   From [place]   Married (second [phone]   3 kids, 2 of whom are local   Retired, Camera operator   Social Determinants of Health   Financial Resource Strain: [place]   . Difficulty of Paying Living Expenses: Not hard at all  Food Insecurity: No Food Insecurity  . Worried About Charity fundraiser in the Last Year: Never true  . Ran Out of  Food in the Last Year: Never true  Transportation Needs: No Transportation Needs  . Lack of Transportation (Medical): No  . Lack of Transportation (Non-Medical): No  Physical Activity: Inactive  . Days of Exercise per Week: 0 days  . Minutes of Exercise per Session: 0 min  Stress: No Stress Concern Present  . Feeling of Stress : Not at all  Social Connections:   . Frequency of Communication with Friends and Family: Not on file  . Frequency of Social Gatherings with Friends and Family: Not on file  . Attends Religious Services: Not on file  . Active Member of Clubs or Organizations: Not on file  . Attends Archivist Meetings: Not on file  . Marital Status: Not on file  Intimate Partner Violence: Not At Risk  . Fear of Current or Ex-Partner: No  . Emotionally Abused: No  . Physically Abused: No  . Sexually Abused: No    Past Surgical History:  Procedure Laterality Date  . ABDOMINAL HYSTERECTOMY    . BACK SURGERY    . CHOLECYSTECTOMY    . FOOT SURGERY    . HERNIA REPAIR    . NASAL SINUS SURGERY      Family History  Problem Relation Age of Onset  . GER disease Mother   . Dementia Mother   . Breast cancer Mother   . Heart disease Father        MI at 53  . Scoliosis Daughter   . Colon cancer Neg Hx     Allergies  Allergen Reactions  . Other Other (See Comments)    Other Reaction: when taking for an extended period of time causes mouth ulcers and esophagus irritation Other Reaction: when taking for an extended period of time causes mouth ulcers and esophagus irritation   . Fenofibric Acid Other (See Comments)    GI upset  . Nsaids     REACTION: No long term use  . Statins Other (See Comments)    Intolerant, myalgias Intolerant, myalgias Intolerant, myalgias Intolerant, myalgias Intolerant, myalgias Intolerant, myalgias   . Trilipix [Choline Fenofibrate]     GI upset  . Wasp Venom Swelling    Current Outpatient Medications on File Prior to Visit    Medication Sig Dispense Refill  . ALPHA LIPOIC ACID PO Take 1 tablet by mouth daily. Daily     . Biotin 10 MG TABS Take 1 tablet by mouth daily. Daily     . calcium carbonate (OS-CAL) 600 MG TABS Take 1,200 mg by mouth 2 (two) times daily with a meal.    . cholecalciferol (VITAMIN D) 1000 UNITS tablet Take 1,000 Units by mouth daily.    . clotrimazole-betamethasone (LOTRISONE) cream Apply 1 application topically 2 (two) times daily as needed. 30 g 0  . Diclofenac Sodium (PENNSAID) 2 % SOLN PLACE 2  PUMP ONTO THE SKIN 2 (TWO) TIMES DAILY. 112 g 1  . diclofenac sodium (VOLTAREN) 1 % GEL Apply 2-4 grams to affected area up to 4 times daily 4 Tube 2  . estrogens, conjugated, (PREMARIN) 0.3 MG tablet Take 1 tablet (0.3 mg total) by mouth every other day. 90 tablet 0  . famotidine (PEPCID) 20 MG tablet Take 1 tablet (20 mg total) by mouth daily. for heartburn. 90 tablet 3  . fenofibrate micronized (LOFIBRA) 134 MG capsule Take 1 capsule (134 mg total) by mouth at bedtime. For cholesterol. 90 capsule 3  . fish oil-omega-3 fatty acids 1000 MG capsule Take 2 g by mouth daily.    Marland Kitchen gabapentin (NEURONTIN) 100 MG capsule TAKE 1 CAPSULE BY MOUTH THREE TIMES A DAY 270 capsule 1  . Ginger, Zingiber officinalis, (GINGER PO) Take 1 tablet by mouth daily.    Marland Kitchen levothyroxine (SYNTHROID) 50 MCG tablet Take 1 tablet by mouth every morning on an empty stomach with water only.  No food or other medications for 30 minutes. 90 tablet 0  . metoprolol tartrate (LOPRESSOR) 50 MG tablet TAKE 1 TABLET ( 50 MG TOTAL) BY MOUTH IN THE MORNING AND 1/2 TABLET BY MOUTH IN THE EVENING. Please keep upcoming appt in March. Thank you 135 tablet 3  . NON FORMULARY Take 1 capsule by mouth daily. Tumeric      Daily     . sulfamethoxazole-trimethoprim (BACTRIM DS) 800-160 MG per tablet Take 1 tablet by mouth 2 (two) times daily.     Marland Kitchen tretinoin (RETIN-A) 0.05 % cream Apply 1 application topically daily as needed (as directed per  dermatologist).   3   No current facility-administered medications on file prior to visit.    BP 110/70   Pulse 75   Temp 97.6 F (36.4 C) (Temporal)   Ht 4\' 9"  (1.448 m)   Wt 153 lb 4 oz (69.5 kg)   SpO2 97%   BMI 33.16 kg/m    Objective:   Physical Exam  Constitutional: She is oriented to person, place, and time. She appears well-nourished.  Eyes: EOM are normal.  Cardiovascular: Normal rate and regular rhythm.  Respiratory: Effort normal and breath sounds normal.  Musculoskeletal:     Cervical back: Neck supple.  Neurological: She is alert and oriented to person, place, and time. No cranial nerve deficit.  Skin: Skin is warm and dry.  Psychiatric: She has a normal mood and affect.           Assessment & Plan:

## 2019-04-23 ENCOUNTER — Other Ambulatory Visit: Payer: Self-pay | Admitting: Primary Care

## 2019-04-23 DIAGNOSIS — N3000 Acute cystitis without hematuria: Secondary | ICD-10-CM

## 2019-04-23 LAB — URINE CULTURE
MICRO NUMBER:: 10013346
SPECIMEN QUALITY:: ADEQUATE

## 2019-04-23 MED ORDER — CEPHALEXIN 500 MG PO CAPS: 500.0000 mg | ORAL_CAPSULE | Freq: Two times a day (BID) | ORAL | 0 refills | Status: AC

## 2019-04-29 ENCOUNTER — Other Ambulatory Visit: Payer: Self-pay | Admitting: Primary Care

## 2019-04-29 DIAGNOSIS — R300 Dysuria: Secondary | ICD-10-CM

## 2019-04-30 ENCOUNTER — Telehealth: Payer: Self-pay | Admitting: Primary Care

## 2019-04-30 NOTE — Telephone Encounter (Signed)
No, it has been taken care of. [facility] faxed the records.

## 2019-04-30 NOTE — Telephone Encounter (Signed)
Patient called today  She stated that spoke with her infectious diease doctor at [place] about her having UTI's  She stated that the provider was not able to see the results of the labs for these and would like the results sent to him . Patient will have the Provider's office at [place] send over request so we can send these over to them for him to review

## 2019-04-30 NOTE — Telephone Encounter (Signed)
Pt came into office and signed a records release for [place] to send records.

## 2019-04-30 NOTE — Telephone Encounter (Addendum)
Noted, do I need to do anything with this?

## 2019-05-05 ENCOUNTER — Other Ambulatory Visit (INDEPENDENT_AMBULATORY_CARE_PROVIDER_SITE_OTHER): Payer: Medicare Other

## 2019-05-05 ENCOUNTER — Other Ambulatory Visit: Payer: Self-pay

## 2019-05-05 DIAGNOSIS — R300 Dysuria: Secondary | ICD-10-CM | POA: Diagnosis not present

## 2019-05-05 LAB — POC URINALSYSI DIPSTICK (AUTOMATED)
Bilirubin, UA: NEGATIVE
Blood, UA: NEGATIVE
Glucose, UA: NEGATIVE
Ketones, UA: NEGATIVE
Leukocytes, UA: NEGATIVE
Nitrite, UA: NEGATIVE
Protein, UA: NEGATIVE
Spec Grav, UA: >=1.03 — AB (ref 1.010–1.025)
Urobilinogen, UA: 0.2 E.U./dL
pH, UA: 6 (ref 5.0–8.0)

## 2019-05-06 LAB — URINE CULTURE
MICRO NUMBER:: 10061724
SPECIMEN QUALITY:: ADEQUATE

## 2019-05-07 ENCOUNTER — Other Ambulatory Visit: Payer: Self-pay | Admitting: Primary Care

## 2019-05-07 DIAGNOSIS — E039 Hypothyroidism, unspecified: Secondary | ICD-10-CM

## 2019-05-11 ENCOUNTER — Other Ambulatory Visit: Payer: Self-pay | Admitting: Primary Care

## 2019-05-11 DIAGNOSIS — Z79890 Hormone replacement therapy (postmenopausal): Secondary | ICD-10-CM

## 2019-05-11 NOTE — Telephone Encounter (Signed)
Please call patient:  Would she be willing to try reducing her premarin to twice weekly? I would really like to try to wean her down off of this medication if possible.

## 2019-05-11 NOTE — Telephone Encounter (Signed)
Last prescribed on 02/10/2019 . Last appointment on 04/20/2018 for acute. No future appointment

## 2019-05-11 NOTE — Telephone Encounter (Signed)
Message left for patient to return my call.  

## 2019-05-13 ENCOUNTER — Other Ambulatory Visit: Payer: Self-pay | Admitting: Primary Care

## 2019-05-13 DIAGNOSIS — G8929 Other chronic pain: Secondary | ICD-10-CM

## 2019-05-13 NOTE — Telephone Encounter (Signed)
Message left for patient to return my call.  

## 2019-05-14 ENCOUNTER — Other Ambulatory Visit: Payer: Self-pay | Admitting: Primary Care

## 2019-05-14 DIAGNOSIS — Z79890 Hormone replacement therapy (postmenopausal): Secondary | ICD-10-CM

## 2019-05-14 NOTE — Telephone Encounter (Signed)
Noted, refill sent to pharmacy. 

## 2019-05-14 NOTE — Telephone Encounter (Signed)
Spoken to patient and she stated that she is willing to try.

## 2019-05-14 NOTE — Telephone Encounter (Signed)
Noted, new Rx sent to pharmacy for twice weekly dosing.

## 2019-05-14 NOTE — Telephone Encounter (Signed)
Spoken to patient and she stated that her headache has improvement. Rx is working well. Please send refills

## 2019-05-17 NOTE — Telephone Encounter (Signed)
Will you make sure she knows that her insurance is no longer covering her Premarin tablets? They are requesting a change to estrace. Our goal is to wean off so does she want to proceed with weaning by switching to another medication?

## 2019-05-17 NOTE — Telephone Encounter (Signed)
Message left for patient to return my call.  

## 2019-05-18 NOTE — Telephone Encounter (Signed)
Spoken and notified patient of [name] [name] comments. Patient would like to change to estrace. She will update on how she doing on this.

## 2019-05-18 NOTE — Telephone Encounter (Signed)
Noted, Rx sent to pharmacy. 

## 2019-05-24 ENCOUNTER — Other Ambulatory Visit: Payer: Self-pay | Admitting: Primary Care

## 2019-05-24 DIAGNOSIS — N3000 Acute cystitis without hematuria: Secondary | ICD-10-CM

## 2019-06-08 DIAGNOSIS — D225 Melanocytic nevi of trunk: Secondary | ICD-10-CM | POA: Diagnosis not present

## 2019-06-08 DIAGNOSIS — D2271 Melanocytic nevi of right lower limb, including hip: Secondary | ICD-10-CM | POA: Diagnosis not present

## 2019-06-08 DIAGNOSIS — L814 Other melanin hyperpigmentation: Secondary | ICD-10-CM | POA: Diagnosis not present

## 2019-06-08 DIAGNOSIS — Z85820 Personal history of malignant melanoma of skin: Secondary | ICD-10-CM | POA: Diagnosis not present

## 2019-06-08 DIAGNOSIS — L821 Other seborrheic keratosis: Secondary | ICD-10-CM | POA: Diagnosis not present

## 2019-06-22 ENCOUNTER — Other Ambulatory Visit: Payer: Self-pay | Admitting: Cardiovascular Disease

## 2019-07-28 ENCOUNTER — Encounter: Payer: Self-pay | Admitting: Orthopaedic Surgery

## 2019-07-28 ENCOUNTER — Ambulatory Visit (INDEPENDENT_AMBULATORY_CARE_PROVIDER_SITE_OTHER): Payer: Medicare Other | Admitting: Orthopaedic Surgery

## 2019-07-28 ENCOUNTER — Other Ambulatory Visit: Payer: Self-pay

## 2019-07-28 DIAGNOSIS — M1712 Unilateral primary osteoarthritis, left knee: Secondary | ICD-10-CM

## 2019-07-28 DIAGNOSIS — M1711 Unilateral primary osteoarthritis, right knee: Secondary | ICD-10-CM

## 2019-07-28 MED ORDER — METHYLPREDNISOLONE ACETATE 40 MG/ML IJ SUSP
40.0000 mg | INTRAMUSCULAR | Status: AC | PRN
  Administered 2019-07-28: 10:00:00 40 mg via INTRA_ARTICULAR

## 2019-07-28 MED ORDER — LIDOCAINE HCL 1 % IJ SOLN
0.5000 mL | INTRAMUSCULAR | Status: AC | PRN
  Administered 2019-07-28: .5 mL

## 2019-07-28 NOTE — Progress Notes (Signed)
   Procedure Note  Patient: [name] [name]             Date of Birth: [birth year]           MRN: [id number]             Visit Date: 07/28/2019 HPI: Mrs. Chinen comes in today for bilateral knee injections.  She last had injections back in November and did well until recently.  No new injury to either knee.  She has had no fevers chills shortness of breath.  She had both Covid vaccines back in March.  Pain is worse whenever she begins to stand after sitting for long period of time.  Right knee she is having painful popping.  Physical exam: Bilateral knees good range of motion.  Global tenderness about the right knee.  Left knee tenderness along medial joint line.  Right knee with positive effusion no abnormal warmth erythema of either knee otherwise.  Procedures: Visit Diagnoses:  1. Unilateral primary osteoarthritis, left knee   2. Unilateral primary osteoarthritis, right knee     Large Joint Inj: bilateral knee on 07/28/2019 10:10 AM Indications: pain Details: 22 G 1.5 in needle, anterolateral approach  Arthrogram: No  Medications (Right): 0.5 mL lidocaine 1 %; 40 mg methylPREDNISolone acetate 40 MG/ML Medications (Left): 0.5 mL lidocaine 1 %; 40 mg methylPREDNISolone acetate 40 MG/ML Aspirate (Left): 17 mL yellow Outcome: tolerated well, no immediate complications Procedure, treatment alternatives, risks and benefits explained, specific risks discussed. Consent was given by the patient. Immediately prior to procedure a time out was called to verify the correct patient, procedure, equipment, support staff and site/side marked as required. Patient was prepped and draped in the usual sterile fashion.     Plan: We will try to gain approval for supplemental injection have her back once these are available.  Questions were encouraged and answered at length today.

## 2019-08-02 ENCOUNTER — Other Ambulatory Visit: Payer: Self-pay | Admitting: Primary Care

## 2019-08-02 DIAGNOSIS — Z79890 Hormone replacement therapy (postmenopausal): Secondary | ICD-10-CM

## 2019-08-03 ENCOUNTER — Other Ambulatory Visit (INDEPENDENT_AMBULATORY_CARE_PROVIDER_SITE_OTHER): Payer: Self-pay | Admitting: Orthopaedic Surgery

## 2019-08-03 NOTE — Telephone Encounter (Signed)
Please advise 

## 2019-08-04 NOTE — Telephone Encounter (Signed)
How is she doing with weaning down on this medication? Can we try further reducing to once weekly?

## 2019-08-04 NOTE — Telephone Encounter (Signed)
Last prescribed on 05/18/2019. Last appointment on    04/21/2019. No future appointment

## 2019-08-05 NOTE — Telephone Encounter (Signed)
Pt called requesting status of refill for estradiol. Pt said so far she is doing pretty good on using estradiol twice a wk; pt does have some H/A coming off med. Pt wants to continue 2 x a wk for now and maybe next refill cut back to using estradiol once a wk.

## 2019-08-05 NOTE — Telephone Encounter (Signed)
Noted, refill provided. This will be our last twice weekly refill, next refill will be for once weekly.

## 2019-08-17 ENCOUNTER — Other Ambulatory Visit: Payer: Self-pay | Admitting: Primary Care

## 2019-08-17 DIAGNOSIS — K601 Chronic anal fissure: Secondary | ICD-10-CM

## 2019-08-17 DIAGNOSIS — Z79890 Hormone replacement therapy (postmenopausal): Secondary | ICD-10-CM

## 2019-08-25 ENCOUNTER — Ambulatory Visit (INDEPENDENT_AMBULATORY_CARE_PROVIDER_SITE_OTHER): Payer: Medicare Other | Admitting: Orthopaedic Surgery

## 2019-08-25 ENCOUNTER — Telehealth: Payer: Self-pay

## 2019-08-25 ENCOUNTER — Other Ambulatory Visit: Payer: Self-pay

## 2019-08-25 ENCOUNTER — Encounter: Payer: Self-pay | Admitting: Orthopaedic Surgery

## 2019-08-25 DIAGNOSIS — M25562 Pain in left knee: Secondary | ICD-10-CM

## 2019-08-25 DIAGNOSIS — M25561 Pain in right knee: Secondary | ICD-10-CM | POA: Diagnosis not present

## 2019-08-25 DIAGNOSIS — M1711 Unilateral primary osteoarthritis, right knee: Secondary | ICD-10-CM

## 2019-08-25 DIAGNOSIS — M1712 Unilateral primary osteoarthritis, left knee: Secondary | ICD-10-CM

## 2019-08-25 DIAGNOSIS — G8929 Other chronic pain: Secondary | ICD-10-CM

## 2019-08-25 NOTE — Telephone Encounter (Signed)
Yes, this has been approved and patient has appointment.

## 2019-08-25 NOTE — Telephone Encounter (Signed)
Did we start this yet? Bilateral knee gel shots I told her we would call her AS SOON as it get approved, can we do that?

## 2019-08-25 NOTE — Telephone Encounter (Signed)
PT aware that she is approved for gel injection.  Approved for SynviscOne, bilateral knee. Buy & Bill Covered at 100% after Co-pay Co-pay of $188.00 required PA required for [employer], PA on file and previously approved. PA Approval # [id number] Valid 02/17/2019- 02/17/2020  Appt.09/01/2019

## 2019-08-25 NOTE — Progress Notes (Signed)
The patient is well-known to [place].  She is 84 year old female with bilateral knee pain and known osteoarthritis.  We did place steroid injections in both her knees about a month ago.  That is certainly helped but is not alleviated her pain enough.  We have recommended hyaluronic acid injections but this is still slow with getting insurance approval for this.  Both knees are osteoarthritic in both her with the right worse than left.  She does have some symptoms of instability she states with the right knee.  When she has been on her knees and legs all day long they do hurt quite a bit in the evening and do wake her up at night.  On examination of her knees today there is neutral alignment but obviously patellofemoral rotation and medial joint tenderness of both knees.  There is a mild effusion with the right knee.  At this point we will try a knee brace for her right knee just for stability purposes.  We will continue to submit her insurance company and the need for hyaluronic acid given the failure of conservative treatment measures including steroid injections.  Again the hyaluronic acid is to treat the pain from osteoarthritis in both her knees.  All questions and concerns were answered and addressed.

## 2019-08-25 NOTE — Telephone Encounter (Signed)
Submitted VOB for SynviscOne, bilateral knee. 

## 2019-09-01 ENCOUNTER — Ambulatory Visit (INDEPENDENT_AMBULATORY_CARE_PROVIDER_SITE_OTHER): Payer: Medicare Other | Admitting: Orthopaedic Surgery

## 2019-09-01 ENCOUNTER — Other Ambulatory Visit: Payer: Self-pay

## 2019-09-01 ENCOUNTER — Encounter: Payer: Self-pay | Admitting: Orthopaedic Surgery

## 2019-09-01 DIAGNOSIS — M1712 Unilateral primary osteoarthritis, left knee: Secondary | ICD-10-CM | POA: Diagnosis not present

## 2019-09-01 DIAGNOSIS — M1711 Unilateral primary osteoarthritis, right knee: Secondary | ICD-10-CM | POA: Diagnosis not present

## 2019-09-01 MED ORDER — HYLAN G-F 20 48 MG/6ML IX SOSY
48.0000 mg | PREFILLED_SYRINGE | INTRA_ARTICULAR | Status: AC | PRN
  Administered 2019-09-01: 48 mg via INTRA_ARTICULAR

## 2019-09-01 NOTE — Progress Notes (Signed)
   Procedure Note  Patient: [name] [name]             Date of Birth: 07-10-1935           MRN: [id number]             Visit Date: 09/01/2019  Procedures: Visit Diagnoses:  1. Unilateral primary osteoarthritis, left knee   2. Unilateral primary osteoarthritis, right knee     Large Joint Inj: R knee on 09/01/2019 9:55 AM Indications: pain and diagnostic evaluation Details: 22 G 1.5 in needle, superolateral approach  Arthrogram: No  Medications: 48 mg Hylan 48 MG/6ML Outcome: tolerated well, no immediate complications Procedure, treatment alternatives, risks and benefits explained, specific risks discussed. Consent was given by the patient. Immediately prior to procedure a time out was called to verify the correct patient, procedure, equipment, support staff and site/side marked as required. Patient was prepped and draped in the usual sterile fashion.   Large Joint Inj: L knee on 09/01/2019 9:55 AM Indications: pain and diagnostic evaluation Details: 22 G 1.5 in needle, superolateral approach  Arthrogram: No  Medications: 48 mg Hylan 48 MG/6ML Outcome: tolerated well, no immediate complications Procedure, treatment alternatives, risks and benefits explained, specific risks discussed. Consent was given by the patient. Immediately prior to procedure a time out was called to verify the correct patient, procedure, equipment, support staff and site/side marked as required. Patient was prepped and draped in the usual sterile fashion.    The patient comes in today for hyaluronic acid injections in both knees to treat the pain from osteoarthritis. This is with Synvisc one. She has had this successfully in the past. 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 She is still on chronic antibiotics. She is 84 years old. She does report daily knee pain and has well-documented osteoarthritis of the knees. She has had no other acute change in her  medical status.  Examination both knees shows varus malalignment with patellofemoral crepitation and pain globally. Both knees have good range of motion.  I did place Synvisc one in both knees without difficulty. All questions and concerns were answered and addressed. She knows to wait at least 3 to 4 months before steroid injections again at least 6 months before hyaluronic acid again.

## 2019-09-03 ENCOUNTER — Telehealth: Payer: Self-pay

## 2019-09-03 NOTE — Telephone Encounter (Signed)
Please notify patient that thyroid function was normal in December 2020. I am happy to speak with her about her symptoms and concerns, would also recommend we repeat her thyroid labs. Please schedule office visit.

## 2019-09-03 NOTE — Telephone Encounter (Signed)
Spoken and notified patient of [name] [name] comments. Patient verbalized understanding. Appointment have been schedule on 09/06/2019

## 2019-09-03 NOTE — Telephone Encounter (Signed)
Patient contacted the office and states she wanted a referral to Dr. Harlow Asa at [facility] - she states he is the MD who started her on her thyroid medication in the past, and he has previously done thyroid ultrasounds for her. She states with her TSH being elevated at her last CPE, and she is still having issues with no energy and hair loss, she would like a referral to him to discuss these issues.  Please advise.

## 2019-09-06 ENCOUNTER — Other Ambulatory Visit: Payer: Self-pay

## 2019-09-06 ENCOUNTER — Encounter: Payer: Self-pay | Admitting: Primary Care

## 2019-09-06 ENCOUNTER — Ambulatory Visit (INDEPENDENT_AMBULATORY_CARE_PROVIDER_SITE_OTHER): Payer: Medicare Other | Admitting: Primary Care

## 2019-09-06 VITALS — BP 118/72 | HR 75 | Temp 95.8°F | Ht <= 58 in | Wt 155.5 lb

## 2019-09-06 DIAGNOSIS — K219 Gastro-esophageal reflux disease without esophagitis: Secondary | ICD-10-CM

## 2019-09-06 DIAGNOSIS — E039 Hypothyroidism, unspecified: Secondary | ICD-10-CM | POA: Diagnosis not present

## 2019-09-06 LAB — TSH: TSH: 2.35 u[IU]/mL (ref 0.35–4.50)

## 2019-09-06 NOTE — Progress Notes (Signed)
Subjective:    Patient ID: [name] [name], female    DOB: December 09, 1935, 84 y.o.   MRN: [id number]  HPI  This visit occurred during the SARS-CoV-2 public health emergency.  Safety protocols were in place, including screening questions prior to the visit, additional usage of staff PPE, and extensive cleaning of exam room while observing appropriate contact time as indicated for disinfecting solutions.   [name] is a 84 year old female with a history of Barrett's Esophagus, mitral valve regurgitation, MVP, hyperlipidemia, hypothyroidism, thyroid nodules, osteoarthritis, recurrent antibiotic use who presents today to discuss hypothyroidism.  She is currently managed on levothyroxine 50 mcg. Last TSH of 3.96 in December 2020. Her last thyroid ultrasound was in June of 2017 which showed solitary 1.8 cm mixed echogenic nodule/pseudo nodule within the right lobe of the thyroid. This was unchanged compared to 2013. She was following with Dr. Harlow Asa at the time.  She called into our office last week requesting referral to endocrinology for management of hypothyroidism, she was asked to come in today to discuss concerns.  She is concerned about her slowly rising TSH over the last 2 years. Symptoms include weight gain, hair thinning, brittle toenails, fatigue over the last 1-2 years, gradually increased.  Also with improvement in reflux but increased gas, bloating, some diarrhea. She does take a fiber supplement and probiotic along with famotidine 20 mg once daily.   BP Readings from Last 3 Encounters:  09/06/19 118/72  04/21/19 110/70  04/07/19 116/70     Review of Systems  Constitutional: Positive for fatigue.  Gastrointestinal:       Belching, abdominal gas  Skin:       Brittle nails, hair thinning       Past Medical History:  Diagnosis Date  . Anxiety    prev on prozac  . Chest pain    Felt to be noncardiac in the past  . Chronic rectal fissure   . Colon polyp   . Dyslipidemia   .  Ejection fraction    EF 60%, echo, 2007, atrial septum bows left to right compatible with increased left atrial pressure  . Fibromyalgia   . GERD (gastroesophageal reflux disease)    Barrett's esophagus  . Hiatal hernia   . Lumbar spine pain    Complex lumbar spine surgery at [facility], 0000000, complicated, MRSA, much of the appliance is removed, patient on chronic antibiotics  . Melanoma ([place])   . Mitral regurgitation    Mild, 2007,  /  moderate or severe echo, September, 2012, eccentric jet wrapping the left atrium, consider TEE for further assessment  . Mitral valve prolapse    Mild mitral regurgitation echo, 2007  . Osteoarthritis   . Scoliosis   . Sinusitis    Multiple sinus operations in the past  . Statin intolerance    As of 2009, no further attempts to use statins  . Thyroid nodule      Social History   Socioeconomic History  . Marital status: Married    Spouse name: Not on file  . Number of children: Not on file  . Years of education: Not on file  . Highest education level: Not on file  Occupational History  . Not on file  Tobacco Use  . Smoking status: Never Smoker  . Smokeless tobacco: Never Used  Substance and Sexual Activity  . Alcohol use: No  . Drug use: No  . Sexual activity: Not on file  Other Topics Concern  .  Not on file  Social History Narrative   From [place]   Married (second [phone]   3 kids, 2 of whom are local   Retired, Camera operator   Social Determinants of Health   Financial Resource Strain: [place]   . Difficulty of Paying Living Expenses: Not hard at all  Food Insecurity: No Food Insecurity  . Worried About Charity fundraiser in the Last Year: Never true  . Ran Out of Food in the Last Year: Never true  Transportation Needs: No Transportation Needs  . Lack of Transportation (Medical): No  . Lack of Transportation (Non-Medical): No  Physical Activity: Inactive  . Days of Exercise per Week: 0 days  . Minutes of Exercise per  Session: 0 min  Stress: No Stress Concern Present  . Feeling of Stress : Not at all  Social Connections:   . Frequency of Communication with Friends and Family:   . Frequency of Social Gatherings with Friends and Family:   . Attends Religious Services:   . Active Member of Clubs or Organizations:   . Attends Archivist Meetings:   Marland Kitchen Marital Status:   Intimate Partner Violence: Not At Risk  . Fear of Current or Ex-Partner: No  . Emotionally Abused: No  . Physically Abused: No  . Sexually Abused: No    Past Surgical History:  Procedure Laterality Date  . ABDOMINAL HYSTERECTOMY    . BACK SURGERY    . CHOLECYSTECTOMY    . FOOT SURGERY    . HERNIA REPAIR    . NASAL SINUS SURGERY      Family History  Problem Relation Age of Onset  . GER disease Mother   . Dementia Mother   . Breast cancer Mother   . Heart disease Father        MI at 33  . Scoliosis Daughter   . Colon cancer Neg Hx     Allergies  Allergen Reactions  . Other Other (See Comments)    Other Reaction: when taking for an extended period of time causes mouth ulcers and esophagus irritation Other Reaction: when taking for an extended period of time causes mouth ulcers and esophagus irritation   . Fenofibric Acid Other (See Comments)    GI upset  . Nsaids     REACTION: No long term use  . Statins Other (See Comments)    Intolerant, myalgias Intolerant, myalgias Intolerant, myalgias Intolerant, myalgias Intolerant, myalgias Intolerant, myalgias   . Trilipix [Choline Fenofibrate]     GI upset  . Wasp Venom Swelling    Current Outpatient Medications on File Prior to Visit  Medication Sig Dispense Refill  . ALPHA LIPOIC ACID PO Take 1 tablet by mouth daily. Daily     . Biotin 10 MG TABS Take 1 tablet by mouth daily. Daily     . calcium carbonate (OS-CAL) 600 MG TABS Take 1,200 mg by mouth 2 (two) times daily with a meal.    . cholecalciferol (VITAMIN D) 1000 UNITS tablet Take 1,000 Units by  mouth daily.    . clotrimazole-betamethasone (LOTRISONE) cream APPLY 1 APPLICATION TOPICALLY 2 (TWO) TIMES DAILY AS NEEDED. 30 g 0  . Diclofenac Sodium (PENNSAID) 2 % SOLN PLACE 2 PUMP ONTO THE SKIN 2 (TWO) TIMES DAILY. 112 g 1  . diclofenac sodium (VOLTAREN) 1 % GEL Apply 2-4 grams to affected area up to 4 times daily 4 Tube 2  . estradiol (ESTRACE) 0.5 MG tablet TAKE 1 TABLET  BY MOUTH TWICE WEEKLY 24 tablet 0  . famotidine (PEPCID) 20 MG tablet Take 1 tablet (20 mg total) by mouth daily. for heartburn. 90 tablet 3  . fenofibrate micronized (LOFIBRA) 134 MG capsule Take 1 capsule (134 mg total) by mouth at bedtime. For cholesterol. 90 capsule 3  . fish oil-omega-3 fatty acids 1000 MG capsule Take 2 g by mouth daily.    Marland Kitchen gabapentin (NEURONTIN) 100 MG capsule TAKE 1 CAPSULE BY MOUTH THREE TIMES A DAY 270 capsule 1  . Ginger, Zingiber officinalis, (GINGER PO) Take 1 tablet by mouth daily.    Marland Kitchen levothyroxine (SYNTHROID) 50 MCG tablet TAKE 1 TABLET BY MOUTH EVERY MORNING WITH WATER ON AN EMPTY STOMACH. NO OTHER FOODS/MEDS FOR 30 MINS 90 tablet 2  . metoprolol tartrate (LOPRESSOR) 50 MG tablet TAKE 1 TABLET BY MOUTH EVERY MORNING AND 1/2 TABLET EVERY EVENING 135 tablet 3  . NON FORMULARY Take 1 capsule by mouth daily. Tumeric      Daily     . topiramate (TOPAMAX) 25 MG tablet TAKE 1 TABLET (25 MG TOTAL) BY MOUTH AT BEDTIME. FOR HEADACHE PREVENTION. 90 tablet 3  . tretinoin (RETIN-A) 0.05 % cream Apply 1 application topically daily as needed (as directed per dermatologist).   3  . [DISCONTINUED] estrogens, conjugated, (PREMARIN) 0.3 MG tablet Take 1 tablet (0.3 mg total) by mouth 2 (two) times a week. 24 tablet 0   No current facility-administered medications on file prior to visit.    BP 118/72   Pulse 75   Temp (!) 95.8 F (35.4 C) (Temporal)   Ht 4\' 9"  (1.448 m)   Wt 155 lb 8 oz (70.5 kg)   SpO2 95%   BMI 33.65 kg/m    Objective:   Physical Exam  Constitutional: She appears  well-nourished.  Cardiovascular: Normal rate and regular rhythm.  Respiratory: Effort normal and breath sounds normal.  Musculoskeletal:     Cervical back: Neck supple.  Skin: Skin is warm and dry.  Psychiatric: She has a normal mood and affect.           Assessment & Plan:

## 2019-09-06 NOTE — Assessment & Plan Note (Signed)
Improved with once daily famotidine, but does have belching, bloating, gas production, diarrhea.  Try increasing famotidine to 20 mg BID, OR hold fiber supplement, OR hold probiotic. She will update.

## 2019-09-06 NOTE — Patient Instructions (Signed)
Stop by the lab prior to leaving today. I will notify you of your results once received.   For bloating/gas try:  1. Increasing famotidine (Pepcid) to twice daily dosing. 2. Hold the fiber supplement  3. Hold the probiotic  Please update me as discussed.  It was a pleasure to see you today!

## 2019-09-06 NOTE — Assessment & Plan Note (Signed)
Symptomatic. Repeat TSH pending. Consider increasing levothyroxine given symptoms. Titrate closer to a range of 1-2.   Last US Thyroid reviewed, nodule unchanged in 2017 from 2013.

## 2019-09-27 ENCOUNTER — Other Ambulatory Visit: Payer: Self-pay

## 2019-09-27 ENCOUNTER — Encounter: Payer: Self-pay | Admitting: Primary Care

## 2019-09-27 ENCOUNTER — Ambulatory Visit (INDEPENDENT_AMBULATORY_CARE_PROVIDER_SITE_OTHER): Payer: Medicare Other | Admitting: Primary Care

## 2019-09-27 VITALS — BP 116/72 | HR 72 | Temp 95.9°F | Ht <= 58 in | Wt 157.0 lb

## 2019-09-27 DIAGNOSIS — R300 Dysuria: Secondary | ICD-10-CM | POA: Diagnosis not present

## 2019-09-27 DIAGNOSIS — R600 Localized edema: Secondary | ICD-10-CM

## 2019-09-27 LAB — POC URINALSYSI DIPSTICK (AUTOMATED)
Bilirubin, UA: NEGATIVE
Blood, UA: NEGATIVE
Glucose, UA: NEGATIVE
Ketones, UA: NEGATIVE
Nitrite, UA: NEGATIVE
Protein, UA: NEGATIVE
Spec Grav, UA: 1.015 (ref 1.010–1.025)
Urobilinogen, UA: 0.2 E.U./dL
pH, UA: 7 (ref 5.0–8.0)

## 2019-09-27 NOTE — Patient Instructions (Signed)
Stop by the lab prior to leaving today. I will notify you of your results once received.   We will be in touch Wednesday this week regarding your urine culture test.   Elevate your legs when possible. Try to get up once every hour to walk around.   Consider compression socks as discussed.  It was a pleasure to see you today!

## 2019-09-27 NOTE — Assessment & Plan Note (Signed)
Chronic, more noticeable recently.  She is very sedentary, discussed importance of activity, elevating legs when resting, compression socks.  Checking BMP and BNP. Echo from 2018 reviewed.  She appears euvolemic overall. Suspect edema is secondary to venous insufficiency. Await labs.

## 2019-09-27 NOTE — Progress Notes (Signed)
Subjective:    Patient ID: [name] [name], female    DOB: 1935-11-21, 84 y.o.   MRN: [id number]  HPI  This visit occurred during the SARS-CoV-2 public health emergency.  Safety protocols were in place, including screening questions prior to the visit, additional usage of staff PPE, and extensive cleaning of exam room while observing appropriate contact time as indicated for disinfecting solutions.   [name] is a 84 year old female with a history of chronic fatigue, acute cystitis, osteoarthritis, hypothyroidism, estrogen deficiency who presents today with a chief complaint of dysuria. She would also like to discuss ankle edema.   1) Dysuria: She also reports urinary frequency, cloudy urine. Symptoms began three days ago. She's been taking AZO over the last several days with temporary improvement.   She took a UTI test two days ago which was "positive". She is compliant to Bactrim daily for MRSA prevention. Her last UTI was in January 2021, E coli culture positive with resistance to bactrim.   2) Ankle Edema: Chronic to bilateral ankles for the last 4-5 months, worse to left side. Swelling is improved, not resolved, when waking in the morning, progressed throughout the day. She leads a sedentary lifestyle, sits a lot due to osteoarthritis. She does not wear compression socks, also does not elevate her legs unless she's in her recliner. She is followed by cardiology, next visit is in late July 2021.   BP Readings from Last 3 Encounters:  09/27/19 116/72  09/06/19 118/72  04/21/19 110/70     Review of Systems  Constitutional: Negative for fever.  Respiratory: Negative for shortness of breath.   Cardiovascular: Positive for leg swelling. Negative for chest pain.  Genitourinary: Positive for dysuria and frequency. Negative for hematuria.       Past Medical History:  Diagnosis Date   Anxiety    prev on prozac   Chest pain    Felt to be noncardiac in the past   Chronic rectal  fissure    Colon polyp    Dyslipidemia    Ejection fraction    EF 60%, echo, 2007, atrial septum bows left to right compatible with increased left atrial pressure   Fibromyalgia    GERD (gastroesophageal reflux disease)    Barrett's esophagus   Hiatal hernia    Lumbar spine pain    Complex lumbar spine surgery at [place], 4680, complicated, MRSA, much of the appliance is removed, patient on chronic antibiotics   Melanoma ([place])    Mitral regurgitation    Mild, 2007,  /  moderate or severe echo, September, 2012, eccentric jet wrapping the left atrium, consider TEE for further assessment   Mitral valve prolapse    Mild mitral regurgitation echo, 2007   Osteoarthritis    Scoliosis    Sinusitis    Multiple sinus operations in the past   Statin intolerance    As of 2009, no further attempts to use statins   Thyroid nodule      Social History   Socioeconomic History   Marital status: Married    Spouse name: Not on file   Number of children: Not on file   Years of education: Not on file   Highest education level: Not on file  Occupational History   Not on file  Tobacco Use   Smoking status: Never Smoker   Smokeless tobacco: Never Used  Vaping Use   Vaping Use: Never used  Substance and Sexual Activity   Alcohol use: No  Drug use: No   Sexual activity: Not on file  Other Topics Concern   Not on file  Social History Narrative   From [place]   Married (second [phone]   3 kids, 2 of whom are local   Retired, Camera operator   Social Determinants of Radio broadcast assistant Strain: Low Risk    Difficulty of Paying Living Expenses: Not hard at all  Food Insecurity: No Food Insecurity   Worried About Charity fundraiser in the Last Year: Never true   Arboriculturist in the Last Year: Never true  Transportation Needs: No Transportation Needs   Lack of Transportation (Medical): No   Lack of Transportation (Non-Medical): No    Physical Activity: Inactive   Days of Exercise per Week: 0 days   Minutes of Exercise per Session: 0 min  Stress: No Stress Concern Present   Feeling of Stress : Not at all  Social Connections:    Frequency of Communication with Friends and Family:    Frequency of Social Gatherings with Friends and Family:    Attends Religious Services:    Active Member of Clubs or Organizations:    Attends Music therapist:    Marital Status:   Intimate Partner Violence: Not At Risk   Fear of Current or Ex-Partner: No   Emotionally Abused: No   Physically Abused: No   Sexually Abused: No    Past Surgical History:  Procedure Laterality Date   ABDOMINAL HYSTERECTOMY     BACK SURGERY     CHOLECYSTECTOMY     FOOT SURGERY     HERNIA REPAIR     NASAL SINUS SURGERY      Family History  Problem Relation Age of Onset   GER disease Mother    Dementia Mother    Breast cancer Mother    Heart disease Father        MI at 37   Scoliosis Daughter    Colon cancer Neg Hx     Allergies  Allergen Reactions   Other Other (See Comments)    Other Reaction: when taking for an extended period of time causes mouth ulcers and esophagus irritation Other Reaction: when taking for an extended period of time causes mouth ulcers and esophagus irritation    Fenofibric Acid Other (See Comments)    GI upset   Nsaids     REACTION: No long term use   Statins Other (See Comments)    Intolerant, myalgias Intolerant, myalgias Intolerant, myalgias Intolerant, myalgias Intolerant, myalgias Intolerant, myalgias    Trilipix [Choline Fenofibrate]     GI upset   Wasp Venom Swelling    Current Outpatient Medications on File Prior to Visit  Medication Sig Dispense Refill   ALPHA LIPOIC ACID PO Take 1 tablet by mouth daily. Daily      Biotin 10 MG TABS Take 1 tablet by mouth daily. Daily      calcium carbonate (OS-CAL) 600 MG TABS Take 1,200 mg by mouth 2 (two)  times daily with a meal.     cholecalciferol (VITAMIN D) 1000 UNITS tablet Take 1,000 Units by mouth daily.     clotrimazole-betamethasone (LOTRISONE) cream APPLY 1 APPLICATION TOPICALLY 2 (TWO) TIMES DAILY AS NEEDED. 30 g 0   Diclofenac Sodium (PENNSAID) 2 % SOLN PLACE 2 PUMP ONTO THE SKIN 2 (TWO) TIMES DAILY. 112 g 1   diclofenac sodium (VOLTAREN) 1 % GEL Apply 2-4 grams to affected area up  to 4 times daily 4 Tube 2   estradiol (ESTRACE) 0.5 MG tablet TAKE 1 TABLET BY MOUTH TWICE WEEKLY 24 tablet 0   famotidine (PEPCID) 20 MG tablet Take 1 tablet (20 mg total) by mouth daily. for heartburn. 90 tablet 3   fenofibrate micronized (LOFIBRA) 134 MG capsule Take 1 capsule (134 mg total) by mouth at bedtime. For cholesterol. 90 capsule 3   fish oil-omega-3 fatty acids 1000 MG capsule Take 2 g by mouth daily.     gabapentin (NEURONTIN) 100 MG capsule TAKE 1 CAPSULE BY MOUTH THREE TIMES A DAY 270 capsule 1   Ginger, Zingiber officinalis, (GINGER PO) Take 1 tablet by mouth daily.     levothyroxine (SYNTHROID) 50 MCG tablet TAKE 1 TABLET BY MOUTH EVERY MORNING WITH WATER ON AN EMPTY STOMACH. NO OTHER FOODS/MEDS FOR 30 MINS 90 tablet 2   metoprolol tartrate (LOPRESSOR) 50 MG tablet TAKE 1 TABLET BY MOUTH EVERY MORNING AND 1/2 TABLET EVERY EVENING 135 tablet 3   NON FORMULARY Take 1 capsule by mouth daily. Tumeric      Daily      tretinoin (RETIN-A) 0.05 % cream Apply 1 application topically daily as needed (as directed per dermatologist).   3   [DISCONTINUED] estrogens, conjugated, (PREMARIN) 0.3 MG tablet Take 1 tablet (0.3 mg total) by mouth 2 (two) times a week. 24 tablet 0   No current facility-administered medications on file prior to visit.    BP 116/72    Pulse 72    Temp (!) 95.9 F (35.5 C) (Temporal)    Ht 4\' 9"  (1.448 m)    Wt 157 lb (71.2 kg)    SpO2 98%    BMI 33.97 kg/m    Objective:   Physical Exam  Cardiovascular: Normal rate and regular rhythm.  Mild bilateral ankle  edema noted. No pitting to lower extremities. Left slightly worse than right.  Respiratory: Effort normal and breath sounds normal.  Musculoskeletal:     Cervical back: Neck supple.  Neurological: She is alert.  Skin: Skin is warm and dry.           Assessment & Plan:

## 2019-09-27 NOTE — Assessment & Plan Note (Signed)
Acute for the last three days, history of acute cystitis. Compliant to oral Bactrim per infectious disease.  UA today with trace leuks, otherwise negative.  Culture sent.  Give history of acute cystitis with resistance, will wait for culture results. She is stable for outpatient treatment.

## 2019-09-28 LAB — BASIC METABOLIC PANEL
BUN: 34 mg/dL — ABNORMAL HIGH (ref 6–23)
CO2: 29 mEq/L (ref 19–32)
Calcium: 9.3 mg/dL (ref 8.4–10.5)
Chloride: 104 mEq/L (ref 96–112)
Creatinine, Ser: 1.41 mg/dL — ABNORMAL HIGH (ref 0.40–1.20)
GFR: 35.67 mL/min — ABNORMAL LOW (ref >60.00)
Glucose, Bld: 102 mg/dL — ABNORMAL HIGH (ref 70–99)
Potassium: 4.3 mEq/L (ref 3.5–5.1)
Sodium: 139 mEq/L (ref 135–145)

## 2019-09-28 LAB — BRAIN NATRIURETIC PEPTIDE: Pro B Natriuretic peptide (BNP): 63 pg/mL (ref 0.0–100.0)

## 2019-09-29 ENCOUNTER — Other Ambulatory Visit: Payer: Self-pay | Admitting: Primary Care

## 2019-09-29 ENCOUNTER — Telehealth: Payer: Self-pay | Admitting: Primary Care

## 2019-09-29 DIAGNOSIS — N3000 Acute cystitis without hematuria: Secondary | ICD-10-CM

## 2019-09-29 LAB — URINE CULTURE
MICRO NUMBER:: 10587748
SPECIMEN QUALITY:: ADEQUATE

## 2019-09-29 MED ORDER — CIPROFLOXACIN HCL 500 MG PO TABS: 500.0000 mg | ORAL_TABLET | Freq: Every day | ORAL | 0 refills | Status: DC

## 2019-09-29 NOTE — Telephone Encounter (Signed)
[facility] called in regards to the ciprofloxacin (CIPRO) 500 MG tablet  They stated in the instructions it advised to take for 10 days but the script was only sent for 5 tablets.

## 2019-09-30 MED ORDER — CIPROFLOXACIN HCL 500 MG PO TABS: 500.0000 mg | ORAL_TABLET | Freq: Every day | ORAL | 0 refills | Status: AC

## 2019-09-30 NOTE — Telephone Encounter (Signed)
Updated Rx sent to pharmacy

## 2019-10-11 ENCOUNTER — Other Ambulatory Visit: Payer: Self-pay | Admitting: Primary Care

## 2019-10-11 DIAGNOSIS — Z79890 Hormone replacement therapy (postmenopausal): Secondary | ICD-10-CM

## 2019-10-15 ENCOUNTER — Other Ambulatory Visit: Payer: Self-pay | Admitting: Primary Care

## 2019-10-15 DIAGNOSIS — N289 Disorder of kidney and ureter, unspecified: Secondary | ICD-10-CM

## 2019-10-19 ENCOUNTER — Other Ambulatory Visit (INDEPENDENT_AMBULATORY_CARE_PROVIDER_SITE_OTHER): Payer: Medicare Other

## 2019-10-19 DIAGNOSIS — N289 Disorder of kidney and ureter, unspecified: Secondary | ICD-10-CM

## 2019-10-19 LAB — BASIC METABOLIC PANEL
BUN: 26 mg/dL — ABNORMAL HIGH (ref 6–23)
CO2: 28 mEq/L (ref 19–32)
Calcium: 9.4 mg/dL (ref 8.4–10.5)
Chloride: 104 mEq/L (ref 96–112)
Creatinine, Ser: 1.22 mg/dL — ABNORMAL HIGH (ref 0.40–1.20)
GFR: 42.15 mL/min — ABNORMAL LOW (ref >60.00)
Glucose, Bld: 100 mg/dL — ABNORMAL HIGH (ref 70–99)
Potassium: 4.3 mEq/L (ref 3.5–5.1)
Sodium: 142 mEq/L (ref 135–145)

## 2019-10-25 ENCOUNTER — Other Ambulatory Visit: Payer: Self-pay | Admitting: Primary Care

## 2019-10-25 DIAGNOSIS — Z1231 Encounter for screening mammogram for malignant neoplasm of breast: Secondary | ICD-10-CM

## 2019-10-28 ENCOUNTER — Other Ambulatory Visit: Payer: Self-pay | Admitting: Primary Care

## 2019-10-28 DIAGNOSIS — E039 Hypothyroidism, unspecified: Secondary | ICD-10-CM

## 2019-11-08 ENCOUNTER — Ambulatory Visit: Payer: Medicare Other | Admitting: Cardiovascular Disease

## 2019-11-18 ENCOUNTER — Other Ambulatory Visit: Payer: Self-pay | Admitting: Primary Care

## 2019-11-18 DIAGNOSIS — Z79890 Hormone replacement therapy (postmenopausal): Secondary | ICD-10-CM

## 2019-11-30 ENCOUNTER — Ambulatory Visit
Admission: RE | Admit: 2019-11-30 | Discharge: 2019-11-30 | Disposition: A | Discharge status: Home / Self Care | Payer: Medicare Other | Source: Ambulatory Visit | Attending: Primary Care | Admitting: Primary Care

## 2019-11-30 ENCOUNTER — Other Ambulatory Visit: Payer: Self-pay

## 2019-11-30 DIAGNOSIS — Z1231 Encounter for screening mammogram for malignant neoplasm of breast: Secondary | ICD-10-CM | POA: Diagnosis not present

## 2019-12-07 ENCOUNTER — Other Ambulatory Visit (INDEPENDENT_AMBULATORY_CARE_PROVIDER_SITE_OTHER): Payer: Medicare Other

## 2019-12-07 ENCOUNTER — Other Ambulatory Visit: Payer: Self-pay

## 2019-12-07 DIAGNOSIS — E039 Hypothyroidism, unspecified: Secondary | ICD-10-CM

## 2019-12-07 LAB — TSH: TSH: 2.93 u[IU]/mL (ref 0.35–4.50)

## 2019-12-29 NOTE — Progress Notes (Signed)
Office Visit Note  Patient: [name] [name]             Date of Birth: 1935/11/22           MRN: [id number]             PCP: [name], NP Referring: [name], NP Visit Date: 12/30/2019 Occupation: @GUAROCC @  Subjective:  Osteoarthritis (Not doing good, Bil knee pain, bil shoulder pain, bil foot numbness)   History of Present Illness: [name] [name] is a 84 y.o. female with history of osteoarthritis and degenerative disc disease.  She states she continues to have a lot of pain and discomfort in her joints.  She complains of discomfort in her right shoulder, bilateral hands, bilateral knee joints and her feet.  She has been seeing Dr. Ninfa Linden for her right knee joint discomfort.  She states she has had some recent Visco supplement injections.  She will also get cortisone injection in the near future.  He has been experiencing some numbness in her feet after prolonged standing.  Activities of Daily Living:  Patient reports morning stiffness for 10 minutes.   Patient Denies nocturnal pain.  Difficulty dressing/grooming: Denies Difficulty climbing stairs: Denies Difficulty getting out of chair: Reports Difficulty using hands for taps, buttons, cutlery, and/or writing: Denies  Review of Systems  Constitutional: Positive for fatigue.  HENT: Negative for mouth dryness.   Eyes: Positive for dryness.  Respiratory: Positive for shortness of breath.   Cardiovascular: Positive for swelling in legs/feet.  Gastrointestinal: Negative for constipation.  Endocrine: Positive for cold intolerance and heat intolerance.  Genitourinary: Negative for difficulty urinating.  Musculoskeletal: Positive for arthralgias, gait problem, joint pain, joint swelling, myalgias, morning stiffness and myalgias. Negative for muscle weakness and muscle tenderness.  Skin: Negative for rash.  Allergic/Immunologic: Negative for susceptible to infections.  Neurological: Positive for numbness and weakness.    Hematological: Positive for bruising/bleeding tendency.  Psychiatric/Behavioral: Negative for sleep disturbance.    PMFS History:  Patient Active Problem List   Diagnosis Date Noted  . Bilateral lower extremity edema 09/27/2019  . Chronic nonintractable headache 04/21/2019  . Long term current use of antibiotics 03/30/2019  . Dysuria 03/30/2019  . Infection of prosthesis ([place]) 02/10/2019  . 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 Chronic left shoulder pain 01/21/2018  . Chronic pain of left knee 01/21/2018  . Chronic pain of right knee 01/21/2018  . Osteoarthritis of glenohumeral joint, left 01/21/2018  . Decreased renal function 01/29/2017  . Anemia 01/29/2017  . Preventative health care 01/29/2017  . Other fatigue 09/03/2016  . Primary insomnia 09/03/2016  . Primary osteoarthritis of both knees 09/03/2016  . DJD (degenerative joint disease), cervical 09/03/2016  . Osteopenia 03/04/2016  . Palpitations 09/19/2014  . Thyroid nodule, uninodular 09/18/2011  . Hypothyroid 08/19/2011  . Postmenopausal HRT (hormone replacement therapy) 08/13/2011  . Mitral regurgitation   . Lumbar spine pain   . Dyslipidemia   . Chest pain   . Statin intolerance   . Mitral valve prolapse   . Ejection fraction   . Fibromyalgia   . ADENOMATOUS COLONIC POLYP 08/15/2007  . GERD 08/15/2007  . BARRETTS ESOPHAGUS 08/15/2007  . HIATAL HERNIA 08/15/2007  . Osteoarthritis 08/15/2007  . CATARACT EXTRACTION, HX OF 08/15/2007    Past Medical History:  Diagnosis Date  . Anxiety    prev on prozac  . Chest pain    Felt to be noncardiac in the past  . Chronic rectal fissure   .  Colon polyp   . Dyslipidemia   . Ejection fraction    EF 60%, echo, 2007, atrial septum bows left to right compatible with increased left atrial pressure  . Fibromyalgia   . GERD (gastroesophageal reflux disease)    Barrett's esophagus  . Hiatal hernia   . Lumbar spine pain    Complex lumbar spine surgery at [facility], 5643,  complicated, MRSA, much of the appliance is removed, patient on chronic antibiotics  . Melanoma ([place])   . Mitral regurgitation    Mild, 2007,  /  moderate or severe echo, September, 2012, eccentric jet wrapping the left atrium, consider TEE for further assessment  . Mitral valve prolapse    Mild mitral regurgitation echo, 2007  . Osteoarthritis   . Scoliosis   . Sinusitis    Multiple sinus operations in the past  . Statin intolerance    As of 2009, no further attempts to use statins  . Thyroid nodule     Family History  Problem Relation Age of Onset  . GER disease Mother   . Dementia Mother   . Breast cancer Mother   . Heart disease Father        MI at 5  . Scoliosis Daughter   . Colon cancer Neg Hx    Past Surgical History:  Procedure Laterality Date  . ABDOMINAL HYSTERECTOMY    . BACK SURGERY    . CHOLECYSTECTOMY    . FOOT SURGERY    . HERNIA REPAIR    . NASAL SINUS SURGERY     Social History   Social History Narrative   From [place]   Married (second [phone]   3 kids, 2 of whom are local   Retired, Armed forces technical officer History  Administered Date(s) Administered  . Influenza Split 02/03/2012  . Influenza,inj,Quad PF,6+ Mos 02/20/2017, 02/03/2018, 02/10/2019  . Influenza-Unspecified 02/02/2016, 01/13/2017, 02/02/2018  . Moderna SARS-COVID-2 Vaccination 04/26/2019, 05/25/2019  . Pneumococcal Conjugate-13 02/10/2019  . Pneumococcal Polysaccharide-23 02/03/2018  . Td 02/03/2016  . Tdap 10/22/2017     Objective: Vital Signs: BP 112/69 (BP Location: Left Arm, Patient Position: Sitting, Cuff Size: Normal)   Pulse 69   Resp 18   Ht 4\' 9"  (1.448 m)   Wt 157 lb (71.2 kg)   BMI 33.97 kg/m    Physical Exam Vitals and nursing note reviewed.  Constitutional:      Appearance: She is well-developed.  HENT:     Head: Normocephalic and atraumatic.  Eyes:     Conjunctiva/sclera: Conjunctivae normal.  Cardiovascular:     Rate and Rhythm:  Normal rate and regular rhythm.     Heart sounds: Normal heart sounds.  Pulmonary:     Effort: Pulmonary effort is normal.     Breath sounds: Normal breath sounds.  Abdominal:     General: Bowel sounds are normal.     Palpations: Abdomen is soft.  Musculoskeletal:     Cervical back: Normal range of motion.  Lymphadenopathy:     Cervical: No cervical adenopathy.  Skin:    General: Skin is warm and dry.     Capillary Refill: Capillary refill takes less than 2 seconds.  Neurological:     Mental Status: She is alert and oriented to person, place, and time.  Psychiatric:        Behavior: Behavior normal.      Musculoskeletal Exam: She has discomfort with range of motion of cervical and lumbar spine. She has thoracic kyphosis.  She had shoulder joint abduction limited to 90 degrees. Elbow joints wrist joints and MCPs with good range of motion. She has bilateral severe PIP and DIP arthritis and subluxation. Hip joints with good range of motion. Knee joints are in good range of motion. She had discomfort range of motion of bilateral knee joints. She has generalized hyperalgesia and tender points.  CDAI Exam: CDAI Score: -- Patient Global: --; Provider Global: -- Swollen: --; Tender: -- Joint Exam 12/30/2019   No joint exam has been documented for this visit   There is currently no information documented on the homunculus. Go to the Rheumatology activity and complete the homunculus joint exam.  Investigation: No additional findings.  Imaging: No results found.  Recent Labs: Lab Results  Component Value Date   WBC 5.3 02/05/2019   HGB 11.6 (L) 02/05/2019   PLT 192.0 02/05/2019   NA 142 10/19/2019   K 4.3 10/19/2019   CL 104 10/19/2019   CO2 28 10/19/2019   GLUCOSE 100 (H) 10/19/2019   BUN 26 (H) 10/19/2019   CREATININE 1.22 (H) 10/19/2019   BILITOT 0.5 02/05/2019   ALKPHOS 47 02/05/2019   AST 18 02/05/2019   ALT 12 02/05/2019   PROT 6.6 02/05/2019   ALBUMIN 4.5  02/05/2019   CALCIUM 9.4 10/19/2019    Speciality Comments: Repeat DEXA due June 2021-ACY 11/18/2018  Procedures:  No procedures performed Allergies: Other, Fenofibric acid, Nsaids, Statins, Trilipix [choline fenofibrate], and Wasp venom   Assessment / Plan:     Visit Diagnoses: Primary osteoarthritis of both hands-she has severe osteoarthritis in her hands with subluxation of PIP and DIP joints. Joint protection muscle strengthening was discussed.  Primary osteoarthritis of left shoulder-she had limited range of motion of bilateral shoulders for up to 90 degrees. She has been followed by Dr. Ninfa Linden.  Primary osteoarthritis of both knees - moderate osteoarthritis and moderate chondromalacia patella. She sees Dr. Ninfa Linden. She recently finished viscosupplementation junctions and will be getting cortisone injections. She requests referral to integrative therapies. She has been seen there before.  DDD (degenerative disc disease), cervical-she has limited range of motion with discomfort.  DDD (degenerative disc disease), lumbar - S/p fusion T11-S1. She has ongoing discomfort in the lumbar region. Per her request will refer her to physical therapy for DDD of cervical and lumbar spine.  Fibromyalgia-she has generalized pain and hyperalgesia.  Primary insomnia-good sleep hygiene was discussed.  Other fatigue-due to fibromyalgia and insomnia.  Osteopenia of multiple sites - T score -2.3 09/2015. T-score: -2.2 09/2017. I will schedule DEXA scan.  Other medical problems are listed as follows:  History of Barrett's esophagus  History of gastroesophageal reflux (GERD)  History of hypothyroidism  History of hiatal hernia  History of anemia  History of mitral valve prolapse  History of hyperlipidemia  Educated about COVID-19 virus infection-use of mask, social distancing and hand hygiene was emphasized. I also advised her to get the booster once available to her.  Orders: Orders  Placed This Encounter  Procedures  . DG BONE DENSITY (DXA)  . Ambulatory referral to Physical Therapy   No orders of the defined types were placed in this encounter.    Follow-Up Instructions: Return in about 6 months (around 06/28/2020) for Osteoarthritis.   Bo Merino, MD  Note - This record has been created using Editor, commissioning.  Chart creation errors have been sought, but may not always  have been located. Such creation errors do not reflect on  the standard of medical  care.

## 2019-12-30 ENCOUNTER — Encounter: Payer: Self-pay | Admitting: Rheumatology

## 2019-12-30 ENCOUNTER — Other Ambulatory Visit: Payer: Self-pay

## 2019-12-30 ENCOUNTER — Ambulatory Visit (INDEPENDENT_AMBULATORY_CARE_PROVIDER_SITE_OTHER): Payer: Medicare Other | Admitting: Rheumatology

## 2019-12-30 ENCOUNTER — Telehealth: Payer: Self-pay | Admitting: Rheumatology

## 2019-12-30 VITALS — BP 112/69 | HR 69 | Resp 18 | Ht <= 58 in | Wt 157.0 lb

## 2019-12-30 DIAGNOSIS — M8589 Other specified disorders of bone density and structure, multiple sites: Secondary | ICD-10-CM

## 2019-12-30 DIAGNOSIS — Z8679 Personal history of other diseases of the circulatory system: Secondary | ICD-10-CM

## 2019-12-30 DIAGNOSIS — F5101 Primary insomnia: Secondary | ICD-10-CM

## 2019-12-30 DIAGNOSIS — M19012 Primary osteoarthritis, left shoulder: Secondary | ICD-10-CM

## 2019-12-30 DIAGNOSIS — M51369 Other intervertebral disc degeneration, lumbar region without mention of lumbar back pain or lower extremity pain: Secondary | ICD-10-CM

## 2019-12-30 DIAGNOSIS — M19042 Primary osteoarthritis, left hand: Secondary | ICD-10-CM

## 2019-12-30 DIAGNOSIS — M5030 Other cervical disc degeneration, unspecified cervical region: Secondary | ICD-10-CM | POA: Diagnosis not present

## 2019-12-30 DIAGNOSIS — M170 Bilateral primary osteoarthritis of knee: Secondary | ICD-10-CM

## 2019-12-30 DIAGNOSIS — M19041 Primary osteoarthritis, right hand: Secondary | ICD-10-CM

## 2019-12-30 DIAGNOSIS — Z8719 Personal history of other diseases of the digestive system: Secondary | ICD-10-CM

## 2019-12-30 DIAGNOSIS — R5383 Other fatigue: Secondary | ICD-10-CM

## 2019-12-30 DIAGNOSIS — Z862 Personal history of diseases of the blood and blood-forming organs and certain disorders involving the immune mechanism: Secondary | ICD-10-CM

## 2019-12-30 DIAGNOSIS — M5136 Other intervertebral disc degeneration, lumbar region: Secondary | ICD-10-CM

## 2019-12-30 DIAGNOSIS — Z7189 Other specified counseling: Secondary | ICD-10-CM

## 2019-12-30 DIAGNOSIS — M797 Fibromyalgia: Secondary | ICD-10-CM

## 2019-12-30 DIAGNOSIS — Z8639 Personal history of other endocrine, nutritional and metabolic disease: Secondary | ICD-10-CM

## 2019-12-30 NOTE — Telephone Encounter (Signed)
Referral has been faxed to [facility]. I attempted to contact patient and left message on machine to advise patient to give them the correct name of therapist when they call to schedule her appointment.

## 2019-12-30 NOTE — Telephone Encounter (Signed)
Patient called stating she had an appointment this morning and requested a referral to physical therapy.  Patient states she gave the wrong name for the referral and she would like to see Neoma Laming not Deloris.

## 2020-01-03 ENCOUNTER — Encounter: Payer: Self-pay | Admitting: Orthopaedic Surgery

## 2020-01-03 ENCOUNTER — Telehealth: Payer: Self-pay | Admitting: Rheumatology

## 2020-01-03 ENCOUNTER — Ambulatory Visit (INDEPENDENT_AMBULATORY_CARE_PROVIDER_SITE_OTHER): Payer: Medicare Other | Admitting: Orthopaedic Surgery

## 2020-01-03 DIAGNOSIS — M25561 Pain in right knee: Secondary | ICD-10-CM

## 2020-01-03 DIAGNOSIS — G8929 Other chronic pain: Secondary | ICD-10-CM | POA: Diagnosis not present

## 2020-01-03 DIAGNOSIS — M25562 Pain in left knee: Secondary | ICD-10-CM | POA: Diagnosis not present

## 2020-01-03 DIAGNOSIS — M1711 Unilateral primary osteoarthritis, right knee: Secondary | ICD-10-CM

## 2020-01-03 DIAGNOSIS — M1712 Unilateral primary osteoarthritis, left knee: Secondary | ICD-10-CM | POA: Diagnosis not present

## 2020-01-03 MED ORDER — METHYLPREDNISOLONE ACETATE 40 MG/ML IJ SUSP
40.0000 mg | INTRAMUSCULAR | Status: AC | PRN
  Administered 2020-01-03: 40 mg via INTRA_ARTICULAR

## 2020-01-03 MED ORDER — LIDOCAINE HCL 1 % IJ SOLN
3.0000 mL | INTRAMUSCULAR | Status: AC | PRN
  Administered 2020-01-03: 3 mL

## 2020-01-03 NOTE — Progress Notes (Signed)
Office Visit Note   Patient: [name] [name]           Date of Birth: [birth year]           MRN: [id number] Visit Date: 01/03/2020              Requested by: [name], NP [place],  [place] [address] PCP: [name], NP   Assessment & Plan: Visit Diagnoses:  1. Unilateral primary osteoarthritis, left knee   2. Unilateral primary osteoarthritis, right knee   3. Chronic pain of right knee   4. Chronic pain of left knee     Plan: I did place a steroid injection in both knees today per her request and she tolerated these well.  She knows to wait at least 3 months between injections.  All questions and concerns were answered addressed.  Follow-up as otherwise as needed.  Follow-Up Instructions: Return if symptoms worsen or fail to improve.   Orders:  Orders Placed This Encounter  Procedures  . Large Joint Inj  . Large Joint Inj   No orders of the defined types were placed in this encounter.     Procedures: Large Joint Inj: R knee on 01/03/2020 2:40 PM Indications: diagnostic evaluation and pain Details: 22 G 1.5 in needle, superolateral approach  Arthrogram: No  Medications: 3 mL lidocaine 1 %; 40 mg methylPREDNISolone acetate 40 MG/ML Outcome: tolerated well, no immediate complications Procedure, treatment alternatives, risks and benefits explained, specific risks discussed. Consent was given by the patient. Immediately prior to procedure a time out was called to verify the correct patient, procedure, equipment, support staff and site/side marked as required. Patient was prepped and draped in the usual sterile fashion.   Large Joint Inj: L knee on 01/03/2020 2:40 PM Indications: diagnostic evaluation and pain Details: 22 G 1.5 in needle, superolateral approach  Arthrogram: No  Medications: 3 mL lidocaine 1 %; 40 mg methylPREDNISolone acetate 40 MG/ML Outcome: tolerated well, no immediate complications Procedure, treatment alternatives, risks  and benefits explained, specific risks discussed. Consent was given by the patient. Immediately prior to procedure a time out was called to verify the correct patient, procedure, equipment, support staff and site/side marked as required. Patient was prepped and draped in the usual sterile fashion.       Clinical Data: No additional findings.   Subjective: Chief Complaint  Patient presents with  . Right Knee - Follow-up  . Left Knee - Follow-up  The patient has known osteoarthritis in her knees and does have knee pain.  She is requesting steroid injections today and her knees.  It has been 5 months since she last had steroid injections.  She has hyaluronic acid in both knees in May of this year but she states that did not really help as much.  She has well-documented osteoarthritis in these knees.  She is not a diabetic.  She has had no other acute change in her medical status.  She is an active 84 year old female.  HPI  Review of Systems She currently denies any headache, chest pain, shortness of breath, fever, chills, nausea, vomiting  Objective: Vital Signs: There were no vitals taken for this visit.  Physical Exam She is alert and orient x3 and in no acute distress Ortho Exam Examination of both knees shows no effusion with global tenderness and good range of motion.  Both knees are ligamentously stable. Specialty Comments:  No specialty comments available.  Imaging: No results  found.   PMFS History: Patient Active Problem List   Diagnosis Date Noted  . Bilateral lower extremity edema 09/27/2019  . Chronic nonintractable headache 04/21/2019  . Long term current use of antibiotics 03/30/2019  . Dysuria 03/30/2019  . Infection of prosthesis ([place]) 02/10/2019  . 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 Chronic left shoulder pain 01/21/2018  . Chronic pain of left knee 01/21/2018  . Chronic pain of right knee 01/21/2018  . Osteoarthritis of glenohumeral joint, left  01/21/2018  . Decreased renal function 01/29/2017  . Anemia 01/29/2017  . Preventative health care 01/29/2017  . Other fatigue 09/03/2016  . Primary insomnia 09/03/2016  . Primary osteoarthritis of both knees 09/03/2016  . DJD (degenerative joint disease), cervical 09/03/2016  . Osteopenia 03/04/2016  . Palpitations 09/19/2014  . Thyroid nodule, uninodular 09/18/2011  . Hypothyroid 08/19/2011  . Postmenopausal HRT (hormone replacement therapy) 08/13/2011  . Mitral regurgitation   . Lumbar spine pain   . Dyslipidemia   . Chest pain   . Statin intolerance   . Mitral valve prolapse   . Ejection fraction   . Fibromyalgia   . ADENOMATOUS COLONIC POLYP 08/15/2007  . GERD 08/15/2007  . BARRETTS ESOPHAGUS 08/15/2007  . HIATAL HERNIA 08/15/2007  . Osteoarthritis 08/15/2007  . CATARACT EXTRACTION, HX OF 08/15/2007   Past Medical History:  Diagnosis Date  . Anxiety    prev on prozac  . Chest pain    Felt to be noncardiac in the past  . Chronic rectal fissure   . Colon polyp   . Dyslipidemia   . Ejection fraction    EF 60%, echo, 2007, atrial septum bows left to right compatible with increased left atrial pressure  . Fibromyalgia   . GERD (gastroesophageal reflux disease)    Barrett's esophagus  . Hiatal hernia   . Lumbar spine pain    Complex lumbar spine surgery at [facility], 3220, complicated, MRSA, much of the appliance is removed, patient on chronic antibiotics  . Melanoma ([place])   . Mitral regurgitation    Mild, 2007,  /  moderate or severe echo, September, 2012, eccentric jet wrapping the left atrium, consider TEE for further assessment  . Mitral valve prolapse    Mild mitral regurgitation echo, 2007  . Osteoarthritis   . Scoliosis   . Sinusitis    Multiple sinus operations in the past  . Statin intolerance    As of 2009, no further attempts to use statins  . Thyroid nodule     Family History  Problem Relation Age of Onset  . GER disease Mother   . Dementia Mother     . Breast cancer Mother   . Heart disease Father        MI at 59  . Scoliosis Daughter   . Colon cancer Neg Hx     Past Surgical History:  Procedure Laterality Date  . ABDOMINAL HYSTERECTOMY    . BACK SURGERY    . CHOLECYSTECTOMY    . FOOT SURGERY    . HERNIA REPAIR    . NASAL SINUS SURGERY     Social History   Occupational History  . Not on file  Tobacco Use  . Smoking status: Never Smoker  . Smokeless tobacco: Never Used  Vaping Use  . Vaping Use: Never used  Substance and Sexual Activity  . Alcohol use: No  . Drug use: No  . Sexual activity: Not on file

## 2020-01-03 NOTE — Telephone Encounter (Signed)
Patient left a voicemail stating she received a call from an office to schedule physical therapy.  Patient states she wants to be referred to Neoma Laming at the urology center for therapy.

## 2020-01-04 NOTE — Telephone Encounter (Signed)
PT changed to [facility] and faxed, pending appt

## 2020-01-07 ENCOUNTER — Telehealth: Payer: Self-pay | Admitting: Rheumatology

## 2020-01-07 NOTE — Telephone Encounter (Signed)
Patient left a voicemail stating she asked for a referral to [facility] to schedule an appointment with Neoma Laming their physical therapist.  Patient states "I still don't know if she is going to do that and if not to let me know."  Patient requested a return call.

## 2020-01-07 NOTE — Telephone Encounter (Signed)
Advised patient that referral has been sent and she can call [facility] to schedule. Patient verbalized understanding.

## 2020-01-19 DIAGNOSIS — M6281 Muscle weakness (generalized): Secondary | ICD-10-CM | POA: Diagnosis not present

## 2020-01-19 DIAGNOSIS — M6289 Other specified disorders of muscle: Secondary | ICD-10-CM | POA: Diagnosis not present

## 2020-01-19 DIAGNOSIS — R102 Pelvic and perineal pain: Secondary | ICD-10-CM | POA: Diagnosis not present

## 2020-01-19 DIAGNOSIS — M62838 Other muscle spasm: Secondary | ICD-10-CM | POA: Diagnosis not present

## 2020-01-26 DIAGNOSIS — M542 Cervicalgia: Secondary | ICD-10-CM | POA: Diagnosis not present

## 2020-01-26 DIAGNOSIS — M545 Low back pain, unspecified: Secondary | ICD-10-CM | POA: Diagnosis not present

## 2020-01-26 DIAGNOSIS — M6281 Muscle weakness (generalized): Secondary | ICD-10-CM | POA: Diagnosis not present

## 2020-01-26 DIAGNOSIS — M62838 Other muscle spasm: Secondary | ICD-10-CM | POA: Diagnosis not present

## 2020-01-31 DIAGNOSIS — M545 Low back pain, unspecified: Secondary | ICD-10-CM | POA: Diagnosis not present

## 2020-01-31 DIAGNOSIS — R262 Difficulty in walking, not elsewhere classified: Secondary | ICD-10-CM | POA: Diagnosis not present

## 2020-01-31 DIAGNOSIS — M6289 Other specified disorders of muscle: Secondary | ICD-10-CM | POA: Diagnosis not present

## 2020-01-31 DIAGNOSIS — R102 Pelvic and perineal pain: Secondary | ICD-10-CM | POA: Diagnosis not present

## 2020-02-02 DIAGNOSIS — R102 Pelvic and perineal pain: Secondary | ICD-10-CM | POA: Diagnosis not present

## 2020-02-02 DIAGNOSIS — M62838 Other muscle spasm: Secondary | ICD-10-CM | POA: Diagnosis not present

## 2020-02-02 DIAGNOSIS — M6281 Muscle weakness (generalized): Secondary | ICD-10-CM | POA: Diagnosis not present

## 2020-02-02 DIAGNOSIS — M545 Low back pain, unspecified: Secondary | ICD-10-CM | POA: Diagnosis not present

## 2020-02-07 NOTE — Progress Notes (Signed)
Cardiology Office Note  Date:  02/08/2020   ID:  [name] [name], DOB [birth year], MRN [id number]  PCP:  [name], NP   Chief Complaint  Patient presents with  . office visit    F/U appointment; Meds verbally reviewed with patient.    HPI:  84 yo female with history of  HLD, statin intolerance,  GERD,  hiatal hernia, fibromyalgia  MRSA at [facility] hospital 2012 Back surgery x 5 2 surgery to "clean out the MRSA", took out "all the metal" "pus pocket size of pus pocket in ABD" bactum to suppress Endocarditis? mitral valve disease , Mild to moderate regurgitation July 2016 Who presents for follow-up of her mitral valve disease, hyperlipidemia  Last seen in clinic August 2020,  Last echocardiogram July 2018 At that time with normal ejection fraction mild MR   Labs reviewed Hemoglobin A1c 4.3 Creatinine 1.22 BUN 26, BNP 63  Followed by orthopedics for her knees Followed by rheumatology for DJD  Presents in a wheelchair No dizziness  Doing PT  chronic back pain She has no chest pain or SOB.No near syncope, syncope or LE edema.  Sleeps in a chair/recliner  Prior history MRSA again discussed with her, she is on suppressive antibiotics Back pain, neck pain, does not want surgery out of fear of her MRSA recurring Was told at some point she will need to have neck surgery before symptoms get severe  EKG personally reviewed by myself on todays visit  shows Normal sinus rhythm with rate 74 bpm no significant ST or T wave changes  Other past medical history reviewed echo July 2016 with normal LV systolic function, mild to moderate mitral regurgitation.   Echo 2018 - Normal LV systolic function; mild diastolic dysfunction; mild MR.     PMH:   has a past medical history of Anxiety, Chest pain, Chronic rectal fissure, Colon polyp, Dyslipidemia, Ejection fraction, Fibromyalgia, GERD (gastroesophageal reflux disease), Hiatal hernia, Lumbar spine pain, Melanoma (HCC),  Mitral regurgitation, Mitral valve prolapse, Osteoarthritis, Scoliosis, Sinusitis, Statin intolerance, and Thyroid nodule.  PSH:    Past Surgical History:  Procedure Laterality Date  . ABDOMINAL HYSTERECTOMY    . BACK SURGERY    . CHOLECYSTECTOMY    . FOOT SURGERY    . HERNIA REPAIR    . NASAL SINUS SURGERY      Current Outpatient Medications  Medication Sig Dispense Refill  . ALPHA LIPOIC ACID PO Take 1 tablet by mouth daily. Daily     . Biotin 10 MG TABS Take 1 tablet by mouth daily. Daily     . calcium carbonate (OS-CAL) 600 MG TABS Take 1,200 mg by mouth 2 (two) times daily with a meal.    . cholecalciferol (VITAMIN D) 1000 UNITS tablet Take 1,000 Units by mouth daily.    . clotrimazole-betamethasone (LOTRISONE) cream APPLY 1 APPLICATION TOPICALLY 2 (TWO) TIMES DAILY AS NEEDED. 30 g 0  . diclofenac sodium (VOLTAREN) 1 % GEL Apply 2-4 grams to affected area up to 4 times daily 4 Tube 2  . estradiol (ESTRACE) 0.5 MG tablet TAKE 1 TABLET BY MOUTH TWICE WEEKLY 24 tablet 0  . famotidine (PEPCID) 20 MG tablet Take 1 tablet (20 mg total) by mouth daily. for heartburn. 90 tablet 3  . fenofibrate micronized (LOFIBRA) 134 MG capsule Take 1 capsule (134 mg total) by mouth at bedtime. For cholesterol. 90 capsule 3  . fish oil-omega-3 fatty acids 1000 MG capsule Take 2 g by mouth daily.    Marland Kitchen  gabapentin (NEURONTIN) 100 MG capsule TAKE 1 CAPSULE BY MOUTH THREE TIMES A DAY 270 capsule 1  . Ginger, Zingiber officinalis, (GINGER PO) Take 1 tablet by mouth daily.    Marland Kitchen levothyroxine (SYNTHROID) 50 MCG tablet TAKE 1 TABLET BY MOUTH EVERY MORNING WITH WATER ON AN EMPTY STOMACH. NO OTHER FOODS/MEDS FOR 30 MINS 90 tablet 2  . metoprolol tartrate (LOPRESSOR) 50 MG tablet TAKE 1 TABLET BY MOUTH EVERY MORNING AND 1/2 TABLET EVERY EVENING 135 tablet 3  . NON FORMULARY Take 1 capsule by mouth daily. Tumeric      Daily     . sulfamethoxazole-trimethoprim (BACTRIM DS) 800-160 MG tablet Take 1 tablet by mouth  daily.    Marland Kitchen tretinoin (RETIN-A) 0.05 % cream Apply 1 application topically daily as needed (as directed per dermatologist).   3   No current facility-administered medications for this visit.     Allergies:   Other, Fenofibric acid, Nsaids, Statins, Trilipix [choline fenofibrate], and Wasp venom   Social History:  The patient  reports that she has never smoked. She has never used smokeless tobacco. She reports that she does not drink alcohol and does not use drugs.   Family History:   family history includes Breast cancer in her mother; Dementia in her mother; GER disease in her mother; Heart disease in her father; Scoliosis in her daughter.    Review of Systems: Review of Systems  Constitutional: Negative.   Respiratory: Negative.   Cardiovascular: Negative.   Gastrointestinal: Negative.   Musculoskeletal: Positive for back pain.  Neurological: Negative.   Psychiatric/Behavioral: Negative.   All other systems reviewed and are negative.   PHYSICAL EXAM: VS:  BP 110/70 (BP Location: Left Arm, Patient Position: Sitting, Cuff Size: Normal)   Pulse 74   Ht 4\' 9"  (1.448 m)   Wt 156 lb (70.8 kg)   SpO2 94%   BMI 33.76 kg/m  , BMI Body mass index is 33.76 kg/m. Constitutional:  oriented to person, place, and time. No distress.  HENT:  Head: Grossly normal Eyes:  no discharge. No scleral icterus.  Neck: No JVD, no carotid bruits  Cardiovascular: Regular rate and rhythm, no murmurs appreciated Pulmonary/Chest: Clear to auscultation bilaterally, no wheezes or rails Abdominal: Soft.  no distension.  no tenderness.  Musculoskeletal: Normal range of motion Neurological:  normal muscle tone. Coordination normal. No atrophy Skin: Skin warm and dry Psychiatric: normal affect, pleasant   Recent Labs: 09/27/2019: Pro B Natriuretic peptide (BNP) 63.0 10/19/2019: BUN 26; Creatinine, Ser 1.22; Potassium 4.3; Sodium 142 12/07/2019: TSH 2.93    Lipid Panel Lab Results  Component Value  Date   CHOL 160 02/05/2019   HDL 65.00 02/05/2019   LDLCALC 71 02/05/2019   TRIG 121.0 02/05/2019      Wt Readings from Last 3 Encounters:  02/08/20 156 lb (70.8 kg)  12/30/19 157 lb (71.2 kg)  09/27/19 157 lb (71.2 kg)       ASSESSMENT AND PLAN:  Non-rheumatic mitral regurgitation Mitral valve regurgitation, mild Murmur not very impressive on today's visit stable  Palpitations -  metoprolol down to 25 twice daily and take extra 25 as needed for breakthrough palpitations Overall stable  Precordial pain Denies significant pain on today's visit, no further workup  Dyslipidemia Currently not on a statin, previously tried Zetia had diarrhea No changes  Fibromyalgia mild Managed by primary care Recommended regular walking program Or aquatherapy  Back pain Numerous back surgeries 0272 at [facility] complication with MRSA Significant arthralgias  Total encounter time more than 25 minutes  Greater than 50% was spent in counseling and coordination of care with the patient    Orders Placed This Encounter  Procedures  . EKG 12-Lead     Signed, Esmond Plants, M.D., Ph.D. 02/08/2020  [place], [place]

## 2020-02-08 ENCOUNTER — Encounter: Payer: Self-pay | Admitting: Cardiovascular Disease

## 2020-02-08 ENCOUNTER — Other Ambulatory Visit: Payer: Self-pay

## 2020-02-08 ENCOUNTER — Ambulatory Visit (INDEPENDENT_AMBULATORY_CARE_PROVIDER_SITE_OTHER): Payer: Medicare Other | Admitting: Cardiovascular Disease

## 2020-02-08 VITALS — BP 110/70 | HR 74 | Ht <= 58 in | Wt 156.0 lb

## 2020-02-08 DIAGNOSIS — R072 Precordial pain: Secondary | ICD-10-CM

## 2020-02-08 DIAGNOSIS — M545 Low back pain, unspecified: Secondary | ICD-10-CM | POA: Diagnosis not present

## 2020-02-08 DIAGNOSIS — M62838 Other muscle spasm: Secondary | ICD-10-CM | POA: Diagnosis not present

## 2020-02-08 DIAGNOSIS — I340 Nonrheumatic mitral (valve) insufficiency: Secondary | ICD-10-CM

## 2020-02-08 DIAGNOSIS — E785 Hyperlipidemia, unspecified: Secondary | ICD-10-CM

## 2020-02-08 DIAGNOSIS — R002 Palpitations: Secondary | ICD-10-CM | POA: Diagnosis not present

## 2020-02-08 DIAGNOSIS — M6289 Other specified disorders of muscle: Secondary | ICD-10-CM | POA: Diagnosis not present

## 2020-02-08 DIAGNOSIS — R102 Pelvic and perineal pain: Secondary | ICD-10-CM | POA: Diagnosis not present

## 2020-02-08 MED ORDER — METOPROLOL TARTRATE 25 MG PO TABS: 25.0000 mg | ORAL_TABLET | ORAL | 3 refills | Status: DC

## 2020-02-08 NOTE — Patient Instructions (Addendum)
Medication Instructions:  Your physician has recommended you make the following change in your medication:  1. CHANGE take Metoprolol tartrate 25 mg twice a day with extra tablet as needed for palpitations.    Make sure to check Blood Pressures 2 hours after your medications.   Please keep a log of your readings so we can see how they trend.  Avoid these things for 30 minutes before checking your blood pressure:  Drinking caffeine.  Drinking alcohol.  Eating.  Smoking.  Exercising.  Five minutes before checking your blood pressure:  Pee.  Sit in a dining chair. Avoid sitting in a soft couch or armchair.  Be quiet. Do not talk.  If you need a refill on your cardiac medications before your next appointment, please call your pharmacy.    Lab work: No new labs needed   If you have labs (blood work) drawn today and your tests are completely normal, you will receive your results only by: Marland Kitchen MyChart Message (if you have MyChart) OR . A paper copy in the mail If you have any lab test that is abnormal or we need to change your treatment, we will call you to review the results.   Testing/Procedures: No new testing needed   Follow-Up: At [facility], you and your health needs are our priority.  As part of our continuing mission to provide you with exceptional heart care, we have created designated Provider Care Teams.  These Care Teams include your primary Cardiologist (physician) and Advanced Practice Providers (APPs -  Physician Assistants and Nurse Practitioners) who all work together to provide you with the care you need, when you need it.  . You will need a follow up appointment as needed  . Providers on your designated Care Team:   . Murray Hodgkins, NP . Christell Faith, PA-C . Marrianne Mood, PA-C  Any Other Special Instructions Will Be Listed Below (If Applicable).  COVID-19 Vaccine Information can be found at:  ShippingScam.co.uk For questions related to vaccine distribution or appointments, please email vaccine@[email or web address] .com or call [phone].

## 2020-02-10 DIAGNOSIS — M6289 Other specified disorders of muscle: Secondary | ICD-10-CM | POA: Diagnosis not present

## 2020-02-10 DIAGNOSIS — M62838 Other muscle spasm: Secondary | ICD-10-CM | POA: Diagnosis not present

## 2020-02-10 DIAGNOSIS — R102 Pelvic and perineal pain: Secondary | ICD-10-CM | POA: Diagnosis not present

## 2020-02-10 DIAGNOSIS — M6281 Muscle weakness (generalized): Secondary | ICD-10-CM | POA: Diagnosis not present

## 2020-02-15 DIAGNOSIS — R102 Pelvic and perineal pain: Secondary | ICD-10-CM | POA: Diagnosis not present

## 2020-02-15 DIAGNOSIS — M545 Low back pain, unspecified: Secondary | ICD-10-CM | POA: Diagnosis not present

## 2020-02-15 DIAGNOSIS — M542 Cervicalgia: Secondary | ICD-10-CM | POA: Diagnosis not present

## 2020-02-15 DIAGNOSIS — M6289 Other specified disorders of muscle: Secondary | ICD-10-CM | POA: Diagnosis not present

## 2020-02-16 ENCOUNTER — Other Ambulatory Visit: Payer: Self-pay | Admitting: Primary Care

## 2020-02-16 DIAGNOSIS — E785 Hyperlipidemia, unspecified: Secondary | ICD-10-CM

## 2020-02-21 ENCOUNTER — Encounter (HOSPITAL_COMMUNITY): Payer: Self-pay

## 2020-02-21 ENCOUNTER — Emergency Department (HOSPITAL_COMMUNITY)
Admission: EM | Admit: 2020-02-21 | Discharge: 2020-02-21 | Disposition: A | Discharge status: Home / Self Care | Payer: Medicare Other | Attending: Emergency Medicine | Admitting: Emergency Medicine

## 2020-02-21 ENCOUNTER — Other Ambulatory Visit: Payer: Self-pay

## 2020-02-21 ENCOUNTER — Emergency Department (HOSPITAL_COMMUNITY): Payer: Medicare Other

## 2020-02-21 DIAGNOSIS — W010XXA Fall on same level from slipping, tripping and stumbling without subsequent striking against object, initial encounter: Secondary | ICD-10-CM | POA: Diagnosis not present

## 2020-02-21 DIAGNOSIS — Y9301 Activity, walking, marching and hiking: Secondary | ICD-10-CM | POA: Insufficient documentation

## 2020-02-21 DIAGNOSIS — S0121XA Laceration without foreign body of nose, initial encounter: Secondary | ICD-10-CM | POA: Diagnosis not present

## 2020-02-21 DIAGNOSIS — W19XXXA Unspecified fall, initial encounter: Secondary | ICD-10-CM | POA: Diagnosis not present

## 2020-02-21 DIAGNOSIS — S0993XA Unspecified injury of face, initial encounter: Secondary | ICD-10-CM | POA: Diagnosis not present

## 2020-02-21 DIAGNOSIS — M25461 Effusion, right knee: Secondary | ICD-10-CM | POA: Diagnosis not present

## 2020-02-21 DIAGNOSIS — R519 Headache, unspecified: Secondary | ICD-10-CM | POA: Insufficient documentation

## 2020-02-21 DIAGNOSIS — E039 Hypothyroidism, unspecified: Secondary | ICD-10-CM | POA: Insufficient documentation

## 2020-02-21 DIAGNOSIS — R600 Localized edema: Secondary | ICD-10-CM | POA: Diagnosis not present

## 2020-02-21 DIAGNOSIS — M62838 Other muscle spasm: Secondary | ICD-10-CM | POA: Diagnosis not present

## 2020-02-21 DIAGNOSIS — S022XXA Fracture of nasal bones, initial encounter for closed fracture: Secondary | ICD-10-CM | POA: Insufficient documentation

## 2020-02-21 DIAGNOSIS — Z79899 Other long term (current) drug therapy: Secondary | ICD-10-CM | POA: Diagnosis not present

## 2020-02-21 DIAGNOSIS — M6281 Muscle weakness (generalized): Secondary | ICD-10-CM | POA: Diagnosis not present

## 2020-02-21 DIAGNOSIS — R102 Pelvic and perineal pain: Secondary | ICD-10-CM | POA: Diagnosis not present

## 2020-02-21 DIAGNOSIS — M19011 Primary osteoarthritis, right shoulder: Secondary | ICD-10-CM | POA: Diagnosis not present

## 2020-02-21 DIAGNOSIS — R5381 Other malaise: Secondary | ICD-10-CM | POA: Diagnosis not present

## 2020-02-21 DIAGNOSIS — M542 Cervicalgia: Secondary | ICD-10-CM | POA: Diagnosis not present

## 2020-02-21 DIAGNOSIS — M25562 Pain in left knee: Secondary | ICD-10-CM | POA: Diagnosis not present

## 2020-02-21 MED ORDER — ACETAMINOPHEN 325 MG PO TABS
650.0000 mg | ORAL_TABLET | Freq: Once | ORAL | Status: AC
  Administered 2020-02-21: 650 mg via ORAL
  Filled 2020-02-21: qty 2

## 2020-02-21 NOTE — ED Notes (Signed)
Bed alarm has been placed and activated for the pt's safety.

## 2020-02-21 NOTE — ED Notes (Signed)
Patient transported to X-ray 

## 2020-02-21 NOTE — ED Provider Notes (Signed)
Results of the ER work-up discussed with the patient.  Imaging is reassuring.  Patient is ambulated.  She is aware of the nasal fracture.  The patient appears reasonably screened and/or stabilized for discharge and I doubt any other medical condition or other [facility] requiring further screening, evaluation, or treatment in the ED at this time prior to discharge.   Results from the ER workup discussed with the patient face to face and all questions answered to the best of my ability. The patient is safe for discharge with strict return precautions.    Varney Biles, MD 02/21/20 [phone]

## 2020-02-21 NOTE — ED Triage Notes (Signed)
Per ems: Pt coming from surgery center getting rehab for joint pain and had a fall when leaving. Pt fell on her face and c/o pain to lip, nose and right shoulder. No neck pain or tenderness. Pt ambulated to bed. No blood thinner or LOC.

## 2020-02-21 NOTE — ED Notes (Addendum)
Pt ambulated to the bathroom without assistance. Gait steady  

## 2020-02-21 NOTE — ED Notes (Addendum)
[facility] bed alarm in place. Bed in locked and lowest position. Call bell within reach.

## 2020-02-21 NOTE — ED Notes (Signed)
Pt and visitor verbalized dc instructions and follow up care. Alert and ambulatory without assistance. Gait steady. No iv.

## 2020-02-21 NOTE — ED Provider Notes (Signed)
[facility] DEPT Provider Note   CSN: [id number] Arrival date & time: 02/21/20  1143     History Chief Complaint  Patient presents with   Lytle Michaels    [name] [name] is a 84 y.o. female.  HPI   Patient was in the facility getting rehab for joint pain.  She was walking out of the elevator when she tripped and fell.  Patient landed forward onto her face.  She is complaining of pain to her nose as well as her upper lip.  She feels like her jaws sore.  She is having trouble breathing out of her nose.  She also has a headache in her forehead.  She denies any neck pain.  She does have some pain in her right shoulder as well as both knees.  Past Medical History:  Diagnosis Date   Anxiety    prev on prozac   Chest pain    Felt to be noncardiac in the past   Chronic rectal fissure    Colon polyp    Dyslipidemia    Ejection fraction    EF 60%, echo, 2007, atrial septum bows left to right compatible with increased left atrial pressure   Fibromyalgia    GERD (gastroesophageal reflux disease)    Barrett's esophagus   Hiatal hernia    Lumbar spine pain    Complex lumbar spine surgery at [facility], 5852, complicated, MRSA, much of the appliance is removed, patient on chronic antibiotics   Melanoma ([place])    Mitral regurgitation    Mild, 2007,  /  moderate or severe echo, September, 2012, eccentric jet wrapping the left atrium, consider TEE for further assessment   Mitral valve prolapse    Mild mitral regurgitation echo, 2007   Osteoarthritis    Scoliosis    Sinusitis    Multiple sinus operations in the past   Statin intolerance    As of 2009, no further attempts to use statins   Thyroid nodule     Patient Active Problem List   Diagnosis Date Noted   Bilateral lower extremity edema 09/27/2019   Chronic nonintractable headache 04/21/2019   Long term current use of antibiotics 03/30/2019   Dysuria 03/30/2019   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[place]) 02/10/2019   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   Chronic left shoulder pain 01/21/2018   Chronic pain of left knee 01/21/2018   Chronic pain of right knee 01/21/2018   Osteoarthritis of glenohumeral joint, left 01/21/2018   Decreased renal function 01/29/2017   Anemia 01/29/2017   Preventative health care 01/29/2017   Other fatigue 09/03/2016   Primary insomnia 09/03/2016   Primary osteoarthritis of both knees 09/03/2016   DJD (degenerative joint disease), cervical 09/03/2016   Osteopenia 03/04/2016   Palpitations 09/19/2014   Thyroid nodule, uninodular 09/18/2011   Hypothyroid 08/19/2011   Postmenopausal HRT (hormone replacement therapy) 08/13/2011   Mitral regurgitation    Lumbar spine pain    Dyslipidemia    Chest pain    Statin intolerance    Mitral valve prolapse    Ejection fraction    Fibromyalgia    ADENOMATOUS COLONIC POLYP 08/15/2007   GERD 08/15/2007   BARRETTS ESOPHAGUS 08/15/2007   HIATAL HERNIA 08/15/2007   Osteoarthritis 08/15/2007   CATARACT EXTRACTION, HX OF 08/15/2007    Past Surgical History:  Procedure Laterality Date   ABDOMINAL HYSTERECTOMY     BACK SURGERY     CHOLECYSTECTOMY     FOOT SURGERY     HERNIA  REPAIR     NASAL SINUS SURGERY       OB History   No obstetric history on file.     Family History  Problem Relation Age of Onset   GER disease Mother    Dementia Mother    Breast cancer Mother    Heart disease Father        MI at 97   Scoliosis Daughter    Colon cancer Neg Hx     Social History   Tobacco Use   Smoking status: Never Smoker   Smokeless tobacco: Never Used  Vaping Use   Vaping Use: Never used  Substance Use Topics   Alcohol use: No   Drug use: No    Home Medications Prior to Admission medications   Medication Sig Start Date End Date Taking? Authorizing Provider  ALPHA LIPOIC ACID PO Take 1 tablet by mouth daily. Daily     [provider]    Biotin 10 MG TABS Take 1 tablet by mouth daily. Daily     [provider]  calcium carbonate (OS-CAL) 600 MG TABS Take 1,200 mg by mouth 2 (two) times daily with a meal.    [provider]  cholecalciferol (VITAMIN D) 1000 UNITS tablet Take 1,000 Units by mouth daily.    [provider]  clotrimazole-betamethasone (LOTRISONE) cream APPLY 1 APPLICATION TOPICALLY 2 (TWO) TIMES DAILY AS NEEDED. 08/19/19   [name], NP  diclofenac sodium (VOLTAREN) 1 % GEL Apply 2-4 grams to affected area up to 4 times daily 05/27/18   Bo Merino, MD  estradiol (ESTRACE) 0.5 MG tablet TAKE 1 TABLET BY MOUTH TWICE WEEKLY 11/19/19   Kuneff, Renee A, DO  famotidine (PEPCID) 20 MG tablet Take 1 tablet (20 mg total) by mouth daily. for heartburn. 02/10/19   [name], NP  fenofibrate micronized (LOFIBRA) 134 MG capsule TAKE 1 CAPSULE (134 MG TOTAL) BY MOUTH AT BEDTIME. FOR CHOLESTEROL. 02/16/20   [name], NP  fish oil-omega-3 fatty acids 1000 MG capsule Take 2 g by mouth daily.    [provider]  gabapentin (NEURONTIN) 100 MG capsule TAKE 1 CAPSULE BY MOUTH THREE TIMES A DAY 08/03/19   Mcarthur Rossetti, MD  Ginger, Zingiber officinalis, (GINGER PO) Take 1 tablet by mouth daily.    [provider]  levothyroxine (SYNTHROID) 50 MCG tablet TAKE 1 TABLET BY MOUTH EVERY MORNING WITH WATER ON AN EMPTY STOMACH. NO OTHER FOODS/MEDS FOR 30 MINS 05/07/19   [name], NP  metoprolol tartrate (LOPRESSOR) 25 MG tablet Take 1 tablet (25 mg total) by mouth as directed. Take 1 tablet 25 mg twice a day and extra tablet as needed for breakthrough palpitations. 02/08/20 05/08/20  Minna Merritts, MD  NON FORMULARY Take 1 capsule by mouth daily. Tumeric      Daily     [provider]  sulfamethoxazole-trimethoprim (BACTRIM DS) 800-160 MG tablet Take 1 tablet by mouth daily.    [provider]  tretinoin (RETIN-A) 0.05 % cream Apply 1  application topically daily as needed (as directed per dermatologist).  12/14/13   [provider]  estrogens, conjugated, (PREMARIN) 0.3 MG tablet Take 1 tablet (0.3 mg total) by mouth 2 (two) times a week. 05/17/19 05/18/19  [name], NP    Allergies    Other, Fenofibric acid, Nsaids, Statins, Trilipix [choline fenofibrate], and Wasp venom  Review of Systems   Review of Systems  All other systems reviewed  and are negative.   Physical Exam Updated Vital Signs BP (!) 149/68    Pulse 76    Temp (!) 97.5 F (36.4 C) (Axillary)    Resp 10    SpO2 94%   Physical Exam Vitals and nursing note reviewed.  Constitutional:      Appearance: She is well-developed.  HENT:     Head: Normocephalic.     Comments: Bruising and swelling to the nose, superficial laceration, dried blood noted in the nares, no septal hematoma noted; superficial laceration to the mucosal aspect of the upper lip, no through and through laceration, no involvement of vermilion border, tenderness palpation forehead    Right Ear: External ear normal.     Left Ear: External ear normal.  Eyes:     General: No scleral icterus.       Right eye: No discharge.        Left eye: No discharge.     Conjunctiva/sclera: Conjunctivae normal.  Neck:     Trachea: No tracheal deviation.  Cardiovascular:     Rate and Rhythm: Normal rate and regular rhythm.  Pulmonary:     Effort: Pulmonary effort is normal. No respiratory distress.     Breath sounds: Normal breath sounds. No stridor. No wheezing or rales.  Abdominal:     General: Bowel sounds are normal. There is no distension.     Palpations: Abdomen is soft.     Tenderness: There is no abdominal tenderness. There is no guarding or rebound.  Musculoskeletal:        General: No tenderness.     Cervical back: Neck supple.     Comments: Bruising noted bilateral anterior knees, no gross deformity, mild tenderness palpation right shoulder, no thoracic or lumbar spine  tenderness, no C-spine tenderness  Skin:    General: Skin is warm and dry.     Findings: No rash.  Neurological:     Mental Status: She is alert.     Cranial Nerves: No cranial nerve deficit (no facial droop, extraocular movements intact, no slurred speech).     Sensory: No sensory deficit.     Motor: No abnormal muscle tone or seizure activity.     Coordination: Coordination normal.     ED Results / Procedures / Treatments   Labs (all labs ordered are listed, but only abnormal results are displayed) Labs Reviewed - No data to display  EKG None  Radiology CT Head Wo Contrast  Result Date: 02/21/2020 CLINICAL DATA:  Fall EXAM: CT HEAD WITHOUT CONTRAST CT MAXILLOFACIAL WITHOUT CONTRAST CT CERVICAL SPINE WITHOUT CONTRAST TECHNIQUE: Multidetector CT imaging of the head, cervical spine, and maxillofacial structures were performed using the standard protocol without intravenous contrast. Multiplanar CT image reconstructions of the cervical spine and maxillofacial structures were also generated. COMPARISON:  CT maxillofacial 01/12/2010. FINDINGS: CT HEAD FINDINGS Brain: No evidence of acute large vascular territory infarction, hemorrhage, hydrocephalus, extra-axial collection or mass lesion/mass effect. Patchy white matter hypoattenuation, likely related to chronic microvascular ischemic disease. Mild generalized cerebral volume loss with ex vacuo ventricular dilation. Vascular: Calcific atherosclerosis. Skull: No acute fracture. Other: No mastoid effusions. CT MAXILLOFACIAL FINDINGS Osseous: Comminuted and mildly displaced bilateral nasal bone fractures. Left TMJ degenerative change. Orbits: Negative. No traumatic or inflammatory finding. Sinuses: Coastal thickening of the right maxillary sinus and scattered ethmoid air cells. No air-fluid levels. Soft tissues: Nasal soft tissue contusion. CT CERVICAL SPINE FINDINGS Alignment: No substantial subluxation. Skull base and vertebrae: No acute fracture.  Vertebral body  heights are maintained. Soft tissues and spinal canal: No prevertebral fluid or swelling. No visible canal hematoma. Disc levels: Moderate multilevel degenerative disc disease with disc height loss, endplate sclerosis, and posterior disc osteophyte complexes. Multilevel facet and uncovertebral hypertrophy. Upper chest: Negative. IMPRESSION: 1. No evidence of acute intracranial abnormality. 2. Comminuted and mildly displaced bilateral nasal bone fractures with overlying contusion. 3. No evidence of acute fracture or traumatic malalignment in the cervical spine. Electronically Signed   By: Margaretha Sheffield MD   On: 02/21/2020 15:00   CT Cervical Spine Wo Contrast  Result Date: 02/21/2020 CLINICAL DATA:  Fall EXAM: CT HEAD WITHOUT CONTRAST CT MAXILLOFACIAL WITHOUT CONTRAST CT CERVICAL SPINE WITHOUT CONTRAST TECHNIQUE: Multidetector CT imaging of the head, cervical spine, and maxillofacial structures were performed using the standard protocol without intravenous contrast. Multiplanar CT image reconstructions of the cervical spine and maxillofacial structures were also generated. COMPARISON:  CT maxillofacial 01/12/2010. FINDINGS: CT HEAD FINDINGS Brain: No evidence of acute large vascular territory infarction, hemorrhage, hydrocephalus, extra-axial collection or mass lesion/mass effect. Patchy white matter hypoattenuation, likely related to chronic microvascular ischemic disease. Mild generalized cerebral volume loss with ex vacuo ventricular dilation. Vascular: Calcific atherosclerosis. Skull: No acute fracture. Other: No mastoid effusions. CT MAXILLOFACIAL FINDINGS Osseous: Comminuted and mildly displaced bilateral nasal bone fractures. Left TMJ degenerative change. Orbits: Negative. No traumatic or inflammatory finding. Sinuses: Coastal thickening of the right maxillary sinus and scattered ethmoid air cells. No air-fluid levels. Soft tissues: Nasal soft tissue contusion. CT CERVICAL SPINE FINDINGS  Alignment: No substantial subluxation. Skull base and vertebrae: No acute fracture. Vertebral body heights are maintained. Soft tissues and spinal canal: No prevertebral fluid or swelling. No visible canal hematoma. Disc levels: Moderate multilevel degenerative disc disease with disc height loss, endplate sclerosis, and posterior disc osteophyte complexes. Multilevel facet and uncovertebral hypertrophy. Upper chest: Negative. IMPRESSION: 1. No evidence of acute intracranial abnormality. 2. Comminuted and mildly displaced bilateral nasal bone fractures with overlying contusion. 3. No evidence of acute fracture or traumatic malalignment in the cervical spine. Electronically Signed   By: Margaretha Sheffield MD   On: 02/21/2020 15:00   CT Maxillofacial Wo Contrast  Result Date: 02/21/2020 CLINICAL DATA:  Fall EXAM: CT HEAD WITHOUT CONTRAST CT MAXILLOFACIAL WITHOUT CONTRAST CT CERVICAL SPINE WITHOUT CONTRAST TECHNIQUE: Multidetector CT imaging of the head, cervical spine, and maxillofacial structures were performed using the standard protocol without intravenous contrast. Multiplanar CT image reconstructions of the cervical spine and maxillofacial structures were also generated. COMPARISON:  CT maxillofacial 01/12/2010. FINDINGS: CT HEAD FINDINGS Brain: No evidence of acute large vascular territory infarction, hemorrhage, hydrocephalus, extra-axial collection or mass lesion/mass effect. Patchy white matter hypoattenuation, likely related to chronic microvascular ischemic disease. Mild generalized cerebral volume loss with ex vacuo ventricular dilation. Vascular: Calcific atherosclerosis. Skull: No acute fracture. Other: No mastoid effusions. CT MAXILLOFACIAL FINDINGS Osseous: Comminuted and mildly displaced bilateral nasal bone fractures. Left TMJ degenerative change. Orbits: Negative. No traumatic or inflammatory finding. Sinuses: Coastal thickening of the right maxillary sinus and scattered ethmoid air cells. No  air-fluid levels. Soft tissues: Nasal soft tissue contusion. CT CERVICAL SPINE FINDINGS Alignment: No substantial subluxation. Skull base and vertebrae: No acute fracture. Vertebral body heights are maintained. Soft tissues and spinal canal: No prevertebral fluid or swelling. No visible canal hematoma. Disc levels: Moderate multilevel degenerative disc disease with disc height loss, endplate sclerosis, and posterior disc osteophyte complexes. Multilevel facet and uncovertebral hypertrophy. Upper chest: Negative. IMPRESSION: 1. No evidence of acute intracranial abnormality.  2. Comminuted and mildly displaced bilateral nasal bone fractures with overlying contusion. 3. No evidence of acute fracture or traumatic malalignment in the cervical spine. Electronically Signed   By: Margaretha Sheffield MD   On: 02/21/2020 15:00    Procedures .Marland KitchenLaceration Repair  Date/Time: 02/21/2020 3:46 PM Performed by: Dorie Rank, MD Authorized by: Dorie Rank, MD   Consent:    Consent obtained:  Verbal   Consent given by:  Patient   Risks discussed:  Infection, need for additional repair, pain, poor cosmetic result and poor wound healing   Alternatives discussed:  No treatment and delayed treatment Universal protocol:    Procedure explained and questions answered to patient or proxy's satisfaction: yes     Relevant documents present and verified: yes     Test results available and properly labeled: yes     Imaging studies available: yes     Required blood products, implants, devices, and special equipment available: yes     Site/side marked: yes     Immediately prior to procedure, a time out was called: yes     Patient identity confirmed:  Verbally with patient Anesthesia (see MAR for exact dosages):    Anesthesia method:  Local infiltration Laceration details:    Location: nose.   Length (cm):  0.5 Treatment:    Area cleansed with:  Saline   Amount of cleaning:  Standard Skin repair:    Repair method:  Tissue  adhesive Post-procedure details:    Dressing:  Open (no dressing)   (including critical care time)  Medications Ordered in ED Medications  acetaminophen (TYLENOL) tablet 650 mg (650 mg Oral Given 02/21/20 1415)    ED Course  I have reviewed the triage vital signs and the nursing notes.  Pertinent labs & imaging results that were available during my care of the patient were reviewed by me and considered in my medical decision making (see chart for details).  Clinical Course as of Feb 21 1603  Mon Feb 21, 2020  1505 CT head without acute findings other than nasal bone fractures   [JK]  1505 CT cervical spine without acute fracture   [JK]    Clinical Course User Index [JK] Dorie Rank, MD   MDM Rules/Calculators/A&P                          Patient presented to the ED for evaluation after mechanical fall.  Patient sustained a superficial laceration to her lip and nose.  X-rays of her nose do show nasal bone fractures.  Plain films of the shoulder and knees are pending.  If negative plan on dc home.  Outpt follow up with ENT.   Dr Kathrynn Humble will follow up on plain films Final Clinical Impression(s) / ED Diagnoses Final diagnoses:  Closed fracture of nasal bone, initial encounter  Fall, initial encounter    Rx / DC Orders ED Discharge Orders    None       Dorie Rank, MD 02/21/20 [phone]

## 2020-02-21 NOTE — Discharge Instructions (Addendum)
Apply ice to help with the swelling.  Take over-the-counter medications as needed for pain.  The Dermabond glue will eventually peel off on its own.  Follow-up with your ENT doctor as we discussed for further evaluation of the nasal bone fractures.

## 2020-02-22 NOTE — ED Notes (Signed)
Pt called regarding concern about ? Antibiotics for her injury. None noted on discharge, encouraged her tho follow up with PCP or return to ED for evaluation. Verbalized understanding.

## 2020-02-25 DIAGNOSIS — M6289 Other specified disorders of muscle: Secondary | ICD-10-CM | POA: Diagnosis not present

## 2020-02-25 DIAGNOSIS — M545 Low back pain, unspecified: Secondary | ICD-10-CM | POA: Diagnosis not present

## 2020-02-25 DIAGNOSIS — M6281 Muscle weakness (generalized): Secondary | ICD-10-CM | POA: Diagnosis not present

## 2020-02-25 DIAGNOSIS — M62838 Other muscle spasm: Secondary | ICD-10-CM | POA: Diagnosis not present

## 2020-02-26 ENCOUNTER — Other Ambulatory Visit (INDEPENDENT_AMBULATORY_CARE_PROVIDER_SITE_OTHER): Payer: Self-pay | Admitting: Orthopaedic Surgery

## 2020-02-28 DIAGNOSIS — S022XXA Fracture of nasal bones, initial encounter for closed fracture: Secondary | ICD-10-CM | POA: Diagnosis not present

## 2020-02-28 DIAGNOSIS — K120 Recurrent oral aphthae: Secondary | ICD-10-CM | POA: Diagnosis not present

## 2020-03-01 ENCOUNTER — Other Ambulatory Visit: Payer: Self-pay

## 2020-03-01 ENCOUNTER — Ambulatory Visit (INDEPENDENT_AMBULATORY_CARE_PROVIDER_SITE_OTHER): Payer: Medicare Other | Admitting: Primary Care

## 2020-03-01 ENCOUNTER — Ambulatory Visit (INDEPENDENT_AMBULATORY_CARE_PROVIDER_SITE_OTHER)
Admission: RE | Admit: 2020-03-01 | Discharge: 2020-03-01 | Disposition: A | Discharge status: Home / Self Care | Payer: Medicare Other | Source: Ambulatory Visit | Attending: Primary Care | Admitting: Primary Care

## 2020-03-01 ENCOUNTER — Inpatient Hospital Stay: Admission: RE | Admit: 2020-03-01 | Payer: Medicare Other | Source: Ambulatory Visit

## 2020-03-01 ENCOUNTER — Encounter: Payer: Self-pay | Admitting: Physician Assistant

## 2020-03-01 ENCOUNTER — Encounter: Payer: Self-pay | Admitting: Primary Care

## 2020-03-01 ENCOUNTER — Ambulatory Visit (INDEPENDENT_AMBULATORY_CARE_PROVIDER_SITE_OTHER): Payer: Medicare Other | Admitting: Physician Assistant

## 2020-03-01 VITALS — BP 118/74 | HR 79 | Temp 97.3°F | Ht <= 58 in | Wt 153.0 lb

## 2020-03-01 DIAGNOSIS — M79641 Pain in right hand: Secondary | ICD-10-CM

## 2020-03-01 DIAGNOSIS — S62306A Unspecified fracture of fifth metacarpal bone, right hand, initial encounter for closed fracture: Secondary | ICD-10-CM | POA: Diagnosis not present

## 2020-03-01 DIAGNOSIS — Z23 Encounter for immunization: Secondary | ICD-10-CM

## 2020-03-01 DIAGNOSIS — G8929 Other chronic pain: Secondary | ICD-10-CM

## 2020-03-01 DIAGNOSIS — M19041 Primary osteoarthritis, right hand: Secondary | ICD-10-CM | POA: Diagnosis not present

## 2020-03-01 DIAGNOSIS — M25562 Pain in left knee: Secondary | ICD-10-CM

## 2020-03-01 DIAGNOSIS — M7989 Other specified soft tissue disorders: Secondary | ICD-10-CM | POA: Diagnosis not present

## 2020-03-01 DIAGNOSIS — S62316A Displaced fracture of base of fifth metacarpal bone, right hand, initial encounter for closed fracture: Secondary | ICD-10-CM | POA: Diagnosis not present

## 2020-03-01 NOTE — Progress Notes (Signed)
Office Visit Note   Patient: [name] [name]           Date of Birth: [birth year]           MRN: [id number] Visit Date: 03/01/2020              Requested by: [name], NP [place],  [place] [address] PCP: [name], NP   Assessment & Plan: Visit Diagnoses:  1. Closed fracture of fifth metacarpal bone of right hand, unspecified fracture morphology, initial encounter   2. Chronic pain of right knee   3. Chronic pain of left knee     Plan:  We will place her in an ulnar gutter splint on the right hand.  Discussed with her that not recommend any surgical intervention for the hand.  She can remove the splint for hygiene purposes.  See back in 2 weeks obtain 3 views of the right hand.  In regards to her bilateral knee pain she has known osteoarthritis of both knees.  Would not recommend repeat cortisone injections at this time due to the fact that is been just over 2 months since she had the injections.  We will reevaluate her knees at next office visit.  Follow-Up Instructions: Return in about 2 weeks (around 03/15/2020).   Orders:  No orders of the defined types were placed in this encounter.  No orders of the defined types were placed in this encounter.     Procedures: No procedures performed   Clinical Data: No additional findings.   Subjective: Chief Complaint  Patient presents with  . Right Hand - Pain    HPI  [name] [name] is well-known by department service comes in today due to a fall on 02/21/2020.  She was initially seen in the ER after falling when she stepped off an elevator and tripped and fell.  She fell forward onto her face.  She did sustain a superficial laceration to her lip nose.  She has a nasal bone fracture.  Radiographs of both knees showed no acute fracture left knee possible cortical irregularity of the lateral femoral condyle right knee.  At that time x-rays were not obtained of the right hand but she was having some pain in  her hand she had radiographs performed today and she brings with her a disc base films show a base of the fifth metacarpal fracture with displacement radially.  No other fractures identified she has significant arthritic changes throughout the right hand.  I did review radiographs from 02/21/2020 of the left knee today which showed no acute findings.  Mild to moderate narrowing medial joint line. Right knee radiographs also reviewed on epic and shows tricompartmental arthritic changes with mild to moderate narrowing medial lateral joint line and also the patellofemoral joint mild to moderate changes.  There is slight cortical irregularity of the lateral femoral condyle seen on the lateral view only may be nondisplaced fractures. She last was seen for her bilateral knees in September and given cortisone injections in both knees she is asking if she can have cortisone injections today.  Review of Systems See HPI OTHERWISE NEGATIVE or noncontributory.  Objective: Vital Signs: There were no vitals taken for this visit.  Physical Exam Constitutional:      Appearance: She is not ill-appearing or diaphoretic.  Pulmonary:     Effort: Pulmonary effort is normal.  Neurological:     Mental Status: She is alert and oriented to person, place, and  time.  Psychiatric:        Mood and Affect: Mood normal.     Ortho Exam Right hand: Degenerative changes throughout the right hand.  No tenting of the skin at the base of the fifth metacarpal.  She has maximal tenderness over the base of the fifth metacarpal region dorsal and volar aspect.  Otherwise the hand is nontender.  There is no significant swelling throughout the hand.  She is able to almost make a complete fist.  Radial pulses intact.  Sensation grossly intact throughout the right hand. Bilateral knees: Tenderness over the medial joint line of both knees.  Overall good range of motion of both knees no instability valgus varus stressing of either knee.   Anterior drawer is negative bilaterally.  No abnormal warmth erythema or effusion of either knee.  Specialty Comments:  No specialty comments available.  Imaging: No results found.   PMFS History: Patient Active Problem List   Diagnosis Date Noted  . Hand pain, right 03/01/2020  . Bilateral lower extremity edema 09/27/2019  . Chronic nonintractable headache 04/21/2019  . Long term current use of antibiotics 03/30/2019  . Dysuria 03/30/2019  . 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 Chronic left shoulder pain 01/21/2018  . Chronic pain of left knee 01/21/2018  . Chronic pain of right knee 01/21/2018  . Osteoarthritis of glenohumeral joint, left 01/21/2018  . Decreased renal function 01/29/2017  . Anemia 01/29/2017  . Preventative health care 01/29/2017  . Other fatigue 09/03/2016  . Primary insomnia 09/03/2016  . Primary osteoarthritis of both knees 09/03/2016  . DJD (degenerative joint disease), cervical 09/03/2016  . Osteopenia 03/04/2016  . Palpitations 09/19/2014  . Thyroid nodule, uninodular 09/18/2011  . Hypothyroid 08/19/2011  . Postmenopausal HRT (hormone replacement therapy) 08/13/2011  . Mitral regurgitation   . Lumbar spine pain   . Dyslipidemia   . Chest pain   . Statin intolerance   . Mitral valve prolapse   . Ejection fraction   . Fibromyalgia   . ADENOMATOUS COLONIC POLYP 08/15/2007  . GERD 08/15/2007  . BARRETTS ESOPHAGUS 08/15/2007  . HIATAL HERNIA 08/15/2007  . Osteoarthritis 08/15/2007  . CATARACT EXTRACTION, HX OF 08/15/2007   Past Medical History:  Diagnosis Date  . Anxiety    prev on prozac  . Chest pain    Felt to be noncardiac in the past  . Chronic rectal fissure   . Colon polyp   . Dyslipidemia   . Ejection fraction    EF 60%, echo, 2007, atrial septum bows left to right compatible with increased left atrial pressure  . Fibromyalgia   . GERD (gastroesophageal reflux disease)    Barrett's  esophagus  . Hiatal hernia   . Lumbar spine pain    Complex lumbar spine surgery at [facility], 0086, complicated, MRSA, much of the appliance is removed, patient on chronic antibiotics  . Melanoma ([place])   . Mitral regurgitation    Mild, 2007,  /  moderate or severe echo, September, 2012, eccentric jet wrapping the left atrium, consider TEE for further assessment  . Mitral valve prolapse    Mild mitral regurgitation echo, 2007  . Osteoarthritis   . Scoliosis   . Sinusitis    Multiple sinus operations in the past  . Statin intolerance    As of 2009, no further attempts to use statins  . Thyroid nodule     Family History  Problem Relation Age of Onset  .  GER disease Mother   . Dementia Mother   . Breast cancer Mother   . Heart disease Father        MI at 15  . Scoliosis Daughter   . Colon cancer Neg Hx     Past Surgical History:  Procedure Laterality Date  . ABDOMINAL HYSTERECTOMY    . BACK SURGERY    . CHOLECYSTECTOMY    . FOOT SURGERY    . HERNIA REPAIR    . NASAL SINUS SURGERY     Social History   Occupational History  . Not on file  Tobacco Use  . Smoking status: Never Smoker  . Smokeless tobacco: Never Used  Vaping Use  . Vaping Use: Never used  Substance and Sexual Activity  . Alcohol use: No  . Drug use: No  . Sexual activity: Not on file

## 2020-03-01 NOTE — Progress Notes (Signed)
Subjective:    Patient ID: DEAN WONDER, female    DOB: 04/23/1935, 84 y.o.   MRN: [id number]  HPI  This visit occurred during the SARS-CoV-2 public health emergency.  Safety protocols were in place, including screening questions prior to the visit, additional usage of staff PPE, and extensive cleaning of exam room while observing appropriate contact time as indicated for disinfecting solutions.   Ms. Mcadams is an 84 year old female with a history of osteoarthritis,   She presented to [facility] on 02/21/20 for evaluation after a fall. She was at physical therapy for her arthritis, stepped out of an elevator, tripped and landed forward onto her face. Since then she noticed swelling and pain to her nose and lips, also headache and right shoulder, bilateral knee pain.  Work up in the ED showed nasal bone fractures, other imaging including knees and shoulder were negative. She was discharged home with recommendations for ENT follow up.   She was evaluated by ENT and was scheduled to have nasal bone repair today, but she cancelled as she didn't want to undergo anesthesia at her age. Her nasal bones will heal naturally, but she may have some disfigurement to her nose which is not a concern for her now.   Her main concern today is ongoing right hand pain since her fall, specifically to the 4th and 5th digits. She's noticed residual bruising and swelling along with pain. Unable to make a fist with her hand due to pain. She did not undergo xray of her hands at the time of her ED visit as she really didn't notice hand pain until a few days later.   Review of Systems  Constitutional: Negative for fever.  Musculoskeletal: Positive for arthralgias and joint swelling.  Skin:       Bruising to hand       Past Medical History:  Diagnosis Date   Anxiety    prev on prozac   Chest pain    Felt to be noncardiac in the past   Chronic rectal fissure    Colon polyp    Dyslipidemia    Ejection fraction      EF 60%, echo, 2007, atrial septum bows left to right compatible with increased left atrial pressure   Fibromyalgia    GERD (gastroesophageal reflux disease)    Barrett's esophagus   Hiatal hernia    Lumbar spine pain    Complex lumbar spine surgery at [place], 3716, complicated, MRSA, much of the appliance is removed, patient on chronic antibiotics   Melanoma ([place])    Mitral regurgitation    Mild, 2007,  /  moderate or severe echo, September, 2012, eccentric jet wrapping the left atrium, consider TEE for further assessment   Mitral valve prolapse    Mild mitral regurgitation echo, 2007   Osteoarthritis    Scoliosis    Sinusitis    Multiple sinus operations in the past   Statin intolerance    As of 2009, no further attempts to use statins   Thyroid nodule      Social History   Socioeconomic History   Marital status: Married    Spouse name: Not on file   Number of children: Not on file   Years of education: Not on file   Highest education level: Not on file  Occupational History   Not on file  Tobacco Use   Smoking status: Never Smoker   Smokeless tobacco: Never Used  Vaping Use   Vaping Use:  Never used  Substance and Sexual Activity   Alcohol use: No   Drug use: No   Sexual activity: Not on file  Other Topics Concern   Not on file  Social History Narrative   From [place]   Married (second [phone]   3 kids, 2 of whom are local   Retired, Camera operator   Social Determinants of Radio broadcast assistant Strain:    Difficulty of Paying Living Expenses: Not on file  Food Insecurity:    Worried About Charity fundraiser in the Last Year: Not on file   [employer] of Food in the Last Year: Not on file  Transportation Needs:    Lack of Transportation (Medical): Not on file   Lack of Transportation (Non-Medical): Not on file  Physical Activity:    Days of Exercise per Week: Not on file   Minutes of Exercise per Session: Not on  file  Stress:    Feeling of Stress : Not on file  Social Connections:    Frequency of Communication with Friends and Family: Not on file   Frequency of Social Gatherings with Friends and Family: Not on file   Attends Religious Services: Not on file   Active Member of Clubs or Organizations: Not on file   Attends Archivist Meetings: Not on file   Marital Status: Not on file  Intimate Partner Violence:    Fear of Current or Ex-Partner: Not on file   Emotionally Abused: Not on file   Physically Abused: Not on file   Sexually Abused: Not on file    Past Surgical History:  Procedure Laterality Date   ABDOMINAL HYSTERECTOMY     BACK SURGERY     CHOLECYSTECTOMY     FOOT SURGERY     HERNIA REPAIR     NASAL SINUS SURGERY      Family History  Problem Relation Age of Onset   GER disease Mother    Dementia Mother    Breast cancer Mother    Heart disease Father        MI at 6   Scoliosis Daughter    Colon cancer Neg Hx     Allergies  Allergen Reactions   Other Other (See Comments)    Other Reaction: when taking for an extended period of time causes mouth ulcers and esophagus irritation Other Reaction: when taking for an extended period of time causes mouth ulcers and esophagus irritation    Fenofibric Acid Other (See Comments)    GI upset   Nsaids     REACTION: No long term use   Statins Other (See Comments)    Intolerant, myalgias Intolerant, myalgias Intolerant, myalgias Intolerant, myalgias Intolerant, myalgias Intolerant, myalgias    Trilipix [Choline Fenofibrate]     GI upset   Wasp Venom Swelling    Current Outpatient Medications on File Prior to Visit  Medication Sig Dispense Refill   ALPHA LIPOIC ACID PO Take 1 tablet by mouth daily. Daily      Biotin 10 MG TABS Take 1 tablet by mouth daily. Daily      calcium carbonate (OS-CAL) 600 MG TABS Take 1,200 mg by mouth 2 (two) times daily with a meal.      cholecalciferol (VITAMIN D) 1000 UNITS tablet Take 1,000 Units by mouth daily.     clotrimazole-betamethasone (LOTRISONE) cream APPLY 1 APPLICATION TOPICALLY 2 (TWO) TIMES DAILY AS NEEDED. (Patient taking differently: Apply 1 application topically 2 (two) times  daily as needed (rash). ) 30 g 0   diclofenac sodium (VOLTAREN) 1 % GEL Apply 2-4 grams to affected area up to 4 times daily (Patient taking differently: Apply 2-4 g topically 4 (four) times daily as needed (pain). ) 4 Tube 2   famotidine (PEPCID) 20 MG tablet Take 1 tablet (20 mg total) by mouth daily. for heartburn. (Patient taking differently: Take 20 mg by mouth daily as needed for heartburn or indigestion. ) 90 tablet 3   fenofibrate micronized (LOFIBRA) 134 MG capsule TAKE 1 CAPSULE (134 MG TOTAL) BY MOUTH AT BEDTIME. FOR CHOLESTEROL. 90 capsule 1   fish oil-omega-3 fatty acids 1000 MG capsule Take 2 g by mouth daily.     gabapentin (NEURONTIN) 100 MG capsule Take 1 capsule (100 mg total) by mouth 3 (three) times daily. TAKE 1 CAPSULE BY MOUTH THREE TIMES A DAY 90 capsule 1   Ginger, Zingiber officinalis, (GINGER PO) Take 1 tablet by mouth daily.     levothyroxine (SYNTHROID) 50 MCG tablet TAKE 1 TABLET BY MOUTH EVERY MORNING WITH WATER ON AN EMPTY STOMACH. NO OTHER FOODS/MEDS FOR 30 MINS 90 tablet 2   metoprolol tartrate (LOPRESSOR) 25 MG tablet Take 1 tablet (25 mg total) by mouth as directed. Take 1 tablet 25 mg twice a day and extra tablet as needed for breakthrough palpitations. 270 tablet 3   NON FORMULARY Take 1 capsule by mouth daily. Tumeric      Daily      sulfamethoxazole-trimethoprim (BACTRIM DS) 800-160 MG tablet Take 1 tablet by mouth daily.     tretinoin (RETIN-A) 0.05 % cream Apply 1 application topically daily as needed (as directed per dermatologist).   3   predniSONE (STERAPRED UNI-PAK 21 TAB) 10 MG (21) TBPK tablet Take by mouth.     [DISCONTINUED] estrogens, conjugated, (PREMARIN) 0.3 MG tablet Take 1  tablet (0.3 mg total) by mouth 2 (two) times a week. 24 tablet 0   No current facility-administered medications on file prior to visit.    BP 118/74    Pulse 79    Temp (!) 97.3 F (36.3 C) (Temporal)    Ht 4\' 9"  (1.448 m)    Wt 153 lb (69.4 kg)    SpO2 94%    BMI 33.11 kg/m    Objective:   Physical Exam Pulmonary:     Effort: Pulmonary effort is normal.  Musculoskeletal:     Comments: Chronic disfigurement to bilateral hands from osteoarthritis. Dark bruising noted to 4th and 5th digits of right hand, left wrist.   Normal ROM to bilateral wrists. Decrease ROM to right hand, unable to make fist due to pain.   Skin:    General: Skin is warm and dry.     Findings: Bruising present.     Comments: Scabbed wound to nasal bridge without signs of infection. Older bruising noted to nose, darker older bruising noted to 4th and 5th digits on right hand, and left wrist.   Neurological:     Mental Status: She is alert.            Assessment & Plan:

## 2020-03-01 NOTE — Progress Notes (Signed)
   Subjective:    Patient ID: [name] [name], female    DOB: [birth year], 84 y.o.   MRN: [id number]  HPI   This visit occurred during the SARS-CoV-2 public health emergency.  Safety protocols were in place, including screening questions prior to the visit, additional usage of staff PPE, and extensive cleaning of exam room while observing appropriate contact time as indicated for disinfecting solutions.   [name] [name] is a 84 year old female with a history of mitral valve prolapse, mitral regurgitation, GERD, hypothyroid, osteoarthritis, osteopenia & DJD arrives today with a chief complaint of right wrist pain.   She was seen at the emergency department for a fall on 02/21/20 while she was leaving physical therapy. She tripped over the door frame and fell forward onto her face, both knees and right shoulder. She had a small laceration to lip and nose. She had negative xrays of right shoulder & bilateral  Knees. She had a CT of head, maxillofacial and cervical spine which showed a mildly displaced nasal bone fracture.    She seen an ENT on Monday who told her they could reset her nasal bone but she decided to cancel this and just follow up with ENT as needed.   She is here today because since this fall she has had pain and swelling in her lateral right hand into fourth & fifth digits. She is having a hard time using her right hand for driving because is painful to hold or picked up things. She did not have an xray of her right hand in the ER.   Review of Systems  HENT: Negative for nosebleeds.        Small laceration to bridge of nose with dermabond in place.   Small laceration to lower lip  Respiratory: Negative.  Negative for shortness of breath.   Cardiovascular: Negative.  Negative for chest pain.  Neurological: Negative.  Negative for dizziness, syncope, weakness, light-headedness and headaches.       Objective:   Physical Exam Constitutional:      Appearance: Normal appearance.  HENT:      Head: Normocephalic.     Mouth/Throat:     Mouth: Mucous membranes are moist.     Comments: Laceration to bridge of nose with dermabond in place  Eyes:     Pupils: Pupils are equal, round, and reactive to light.  Cardiovascular:     Rate and Rhythm: Normal rate.     Heart sounds: Normal heart sounds.  Pulmonary:     Effort: Pulmonary effort is normal.  Musculoskeletal:        General: Normal range of motion.     Cervical back: Normal range of motion.  Skin:    General: Skin is warm.     Capillary Refill: Capillary refill takes less than 2 seconds.  Neurological:     General: No focal deficit present.     Mental Status: She is alert.           Assessment & Plan:

## 2020-03-01 NOTE — Patient Instructions (Addendum)
Stop by XRAY Today for an XRAY of your right hand.   We will be in touch with you regarding your xray results.   It was a pleasure to see you today!    Health Maintenance Due  Topic Date Due  . INFLUENZA VACCINE will get in office today  11/14/2019    Influenza (Flu) Vaccine (Inactivated or Recombinant): What You Need to Know 1. Why get vaccinated? Influenza vaccine can prevent influenza (flu). Flu is a contagious disease that spreads around the [place] every year, usually between October and May. Anyone can get the flu, but it is more dangerous for some people. Infants and young children, people 75 years of age and older, pregnant women, and people with certain health conditions or a weakened immune system are at greatest risk of flu complications. Pneumonia, bronchitis, sinus infections and ear infections are examples of flu-related complications. If you have a medical condition, such as heart disease, cancer or diabetes, flu can make it worse. Flu can cause fever and chills, sore throat, muscle aches, fatigue, cough, headache, and runny or stuffy nose. Some people may have vomiting and diarrhea, though this is more common in children than adults. Each year thousands of people in the [place] die from flu, and many more are hospitalized. Flu vaccine prevents millions of illnesses and flu-related visits to the doctor each year. 2. Influenza vaccine CDC recommends everyone 85 months of age and older get vaccinated every flu season. Children 6 months through 29 years of age may need 2 doses during a single flu season. Everyone else needs only 1 dose each flu season. It takes about 2 weeks for protection to develop after vaccination. There are many flu viruses, and they are always changing. Each year a new flu vaccine is made to protect against three or four viruses that are likely to cause disease in the upcoming flu season. Even when the vaccine doesn't exactly match these viruses,  it may still provide some protection. Influenza vaccine does not cause flu. Influenza vaccine may be given at the same time as other vaccines. 3. Talk with your health care provider Tell your vaccine provider if the person getting the vaccine:  Has had an allergic reaction after a previous dose of influenza vaccine, or has any severe, life-threatening allergies.  Has ever had Guillain-Barr Syndrome (also called GBS). In some cases, your health care provider may decide to postpone influenza vaccination to a future visit. People with minor illnesses, such as a cold, may be vaccinated. People who are moderately or severely ill should usually wait until they recover before getting influenza vaccine. Your health care provider can give you more information. 4. Risks of a vaccine reaction  Soreness, redness, and swelling where shot is given, fever, muscle aches, and headache can happen after influenza vaccine.  There may be a very small increased risk of Guillain-Barr Syndrome (GBS) after inactivated influenza vaccine (the flu shot). Young children who get the flu shot along with pneumococcal vaccine (PCV13), and/or DTaP vaccine at the same time might be slightly more likely to have a seizure caused by fever. Tell your health care provider if a child who is getting flu vaccine has ever had a seizure. People sometimes faint after medical procedures, including vaccination. Tell your provider if you feel dizzy or have vision changes or ringing in the ears. As with any medicine, there is a very remote chance of a vaccine causing a severe allergic reaction, other serious injury, or death.  5. What if there is a serious problem? An allergic reaction could occur after the vaccinated person leaves the clinic. If you see signs of a severe allergic reaction (hives, swelling of the face and throat, difficulty breathing, a fast heartbeat, dizziness, or weakness), call 9-1-1 and get the person to the nearest  hospital. For other signs that concern you, call your health care provider. Adverse reactions should be reported to the Vaccine Adverse Event Reporting System (VAERS). Your health care provider will usually file this report, or you can do it yourself. Visit the VAERS website at www.vaers.SamedayNews.es or call (458) 665-3835.VAERS is only for reporting reactions, and VAERS staff do not give medical advice. 6. The National Vaccine Injury Compensation Program The [employer] Vaccine Injury Compensation Program (VICP) is a federal program that was created to compensate people who may have been injured by certain vaccines. Visit the VICP website at GoldCloset.com.ee or call (604)668-9279 to learn about the program and about filing a claim. There is a time limit to file a claim for compensation. 7. How can I learn more?  Ask your healthcare provider.  Call your local or state health department.  Contact the Centers for Disease Control and Prevention (CDC): ? Call 2030650590 (1-800-CDC-INFO) or ? Visit CDC's https://gibson.com/ Vaccine Information Statement (Interim) Inactivated Influenza Vaccine (11/27/2017) This information is not intended to replace advice given to you by your health care provider. Make sure you discuss any questions you have with your health care provider. Document Revised: 07/21/2018 Document Reviewed: 12/01/2017 Elsevier Patient Education  [place].   Recommended follow up: No follow-ups on file.

## 2020-03-01 NOTE — Assessment & Plan Note (Addendum)
She had a fall on 11/8 with right hand pain & swelling since.   Will obtain xray of right hand today to rule out acute fracture. Xray today with metacarpal fracture to right 5th digit. Referral placed for orthopedic evaluation.    Agree with assessment and plan. [name], NP

## 2020-03-02 ENCOUNTER — Other Ambulatory Visit: Payer: Self-pay | Admitting: Primary Care

## 2020-03-02 ENCOUNTER — Other Ambulatory Visit: Admission: RE | Admit: 2020-03-02 | Payer: Medicare Other | Source: Ambulatory Visit

## 2020-03-02 DIAGNOSIS — E039 Hypothyroidism, unspecified: Secondary | ICD-10-CM

## 2020-03-03 DIAGNOSIS — M542 Cervicalgia: Secondary | ICD-10-CM | POA: Diagnosis not present

## 2020-03-03 DIAGNOSIS — M62838 Other muscle spasm: Secondary | ICD-10-CM | POA: Diagnosis not present

## 2020-03-03 DIAGNOSIS — R262 Difficulty in walking, not elsewhere classified: Secondary | ICD-10-CM | POA: Diagnosis not present

## 2020-03-03 DIAGNOSIS — M6281 Muscle weakness (generalized): Secondary | ICD-10-CM | POA: Diagnosis not present

## 2020-03-06 ENCOUNTER — Ambulatory Visit
Admission: RE | Admit: 2020-03-06 | Payer: Medicare Other | Source: Home / Self Care | Admitting: Unknown Physician Specialty

## 2020-03-06 ENCOUNTER — Other Ambulatory Visit: Payer: Self-pay | Admitting: Primary Care

## 2020-03-06 ENCOUNTER — Encounter: Admission: RE | Payer: Self-pay | Source: Home / Self Care

## 2020-03-06 DIAGNOSIS — K601 Chronic anal fissure: Secondary | ICD-10-CM

## 2020-03-06 SURGERY — CLOSED REDUCTION, FRACTURE, NASAL BONE
Anesthesia: General

## 2020-03-08 ENCOUNTER — Ambulatory Visit (INDEPENDENT_AMBULATORY_CARE_PROVIDER_SITE_OTHER): Payer: Self-pay | Admitting: Orthopaedic Surgery

## 2020-03-08 ENCOUNTER — Encounter: Payer: Self-pay | Admitting: Orthopaedic Surgery

## 2020-03-08 ENCOUNTER — Ambulatory Visit (INDEPENDENT_AMBULATORY_CARE_PROVIDER_SITE_OTHER): Payer: Medicare Other

## 2020-03-08 DIAGNOSIS — S62306A Unspecified fracture of fifth metacarpal bone, right hand, initial encounter for closed fracture: Secondary | ICD-10-CM

## 2020-03-08 DIAGNOSIS — M25562 Pain in left knee: Secondary | ICD-10-CM

## 2020-03-08 DIAGNOSIS — M79641 Pain in right hand: Secondary | ICD-10-CM

## 2020-03-08 NOTE — Telephone Encounter (Signed)
Called patient to schedule cpe. LVM to call back.  

## 2020-03-08 NOTE — Progress Notes (Signed)
Office Visit Note   Patient: [name] [name]           Date of Birth: 1935/07/16           MRN: [id number] Visit Date: 03/08/2020              Requested by: [name], NP [place],  [place] [address] PCP: [name], NP   Assessment & Plan: Visit Diagnoses:  1. Pain in right hand   2. Closed fracture of fifth metacarpal bone of right hand, unspecified fracture morphology, initial encounter   3. Acute pain of left knee     Plan: Recommend she apply Voltaren gel up to 4 times daily to the left knee.  Discussed with her that the contusion like this can take several months to totally resolve.  In regards to her right hand she will continue the ulnar gutter splint.  We will see her back in 3 weeks and obtain 3 views of the right hand at that time.  Questions were encouraged and answered by Dr. Ninfa Linden myself.  Follow-Up Instructions: Return in about 3 weeks (around 03/29/2020).   Orders:  Orders Placed This Encounter  Procedures  . XR Hand Complete Right   No orders of the defined types were placed in this encounter.     Procedures: No procedures performed   Clinical Data: No additional findings.   Subjective: Chief Complaint  Patient presents with  . Left Knee - Pain    HPI [name] [name] returns today status post fall which she was seen in the ER.  She is concerned due to the fact that she has some swelling over the anterior aspect of the left knee what she describes as a "knot".  She has had no new injury to the knee.  Again did review her x-rays which were obtained in the ER on 02/21/2020.  She is also asking for repeat films of her right hand today so she does not have to come back next week.  Again she is being treated on the gutter splint due to a base of the fifth metacarpal fracture which is  Displaced.  Review of Systems See HPI otherwise negative  Objective: Vital Signs: There were no vitals taken for this visit.  Physical  Exam Constitutional:      Appearance: She is not ill-appearing or diaphoretic.  Pulmonary:     Effort: Pulmonary effort is normal.  Neurological:     Mental Status: She is alert and oriented to person, place, and time.  Psychiatric:        Mood and Affect: Mood normal.     Ortho Exam Right hand ulnar gutter splint fits well. Left knee: She has some venous congestion over the patella tendon area and an area of ecchymosis.  She is able do straight leg raise.  There is no instability valgus varus stressing left knee.  She has good range of motion of the knee.  No abnormal warmth erythema of the left knee. Specialty Comments:  No specialty comments available.  Imaging: XR Hand Complete Right  Result Date: 03/08/2020 Right hand 3 views: No acute findings.  The base of the fifth metacarpal fracture image unchanged in overall position alignment.  No significant consolidation can be seen at this point.    PMFS History: Patient Active Problem List   Diagnosis Date Noted  . Hand pain, right 03/01/2020  . Bilateral lower extremity edema 09/27/2019  . Chronic nonintractable headache  04/21/2019  . Long term current use of antibiotics 03/30/2019  . Dysuria 03/30/2019  . 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 Chronic left shoulder pain 01/21/2018  . Chronic pain of left knee 01/21/2018  . Chronic pain of right knee 01/21/2018  . Osteoarthritis of glenohumeral joint, left 01/21/2018  . Decreased renal function 01/29/2017  . Anemia 01/29/2017  . Preventative health care 01/29/2017  . Other fatigue 09/03/2016  . Primary insomnia 09/03/2016  . Primary osteoarthritis of both knees 09/03/2016  . DJD (degenerative joint disease), cervical 09/03/2016  . MRSA infection 07/31/2016  . Chronic rhinitis 07/24/2016  . Sinusitis chronic, sphenoidal 07/24/2016  . Osteopenia 03/04/2016  . Palpitations 09/19/2014  . Thyroid nodule, uninodular 09/18/2011   . Hypothyroid 08/19/2011  . Postmenopausal HRT (hormone replacement therapy) 08/13/2011  . Endocarditis 04/24/2011  . Mitral regurgitation   . Lumbar spine pain   . Dyslipidemia   . Chest pain   . Statin intolerance   . Mitral valve prolapse   . Ejection fraction   . Fibromyalgia   . ADENOMATOUS COLONIC POLYP 08/15/2007  . GERD 08/15/2007  . BARRETTS ESOPHAGUS 08/15/2007  . HIATAL HERNIA 08/15/2007  . Osteoarthritis 08/15/2007  . CATARACT EXTRACTION, HX OF 08/15/2007  . Hiatal hernia 08/15/2007   Past Medical History:  Diagnosis Date  . Anxiety    prev on prozac  . Chest pain    Felt to be noncardiac in the past  . Chronic rectal fissure   . Colon polyp   . Dyslipidemia   . Ejection fraction    EF 60%, echo, 2007, atrial septum bows left to right compatible with increased left atrial pressure  . Fibromyalgia   . GERD (gastroesophageal reflux disease)    Barrett's esophagus  . Hiatal hernia   . Lumbar spine pain    Complex lumbar spine surgery at [facility], 5701, complicated, MRSA, much of the appliance is removed, patient on chronic antibiotics  . Melanoma ([place])   . Mitral regurgitation    Mild, 2007,  /  moderate or severe echo, September, 2012, eccentric jet wrapping the left atrium, consider TEE for further assessment  . Mitral valve prolapse    Mild mitral regurgitation echo, 2007  . Osteoarthritis   . Scoliosis   . Sinusitis    Multiple sinus operations in the past  . Statin intolerance    As of 2009, no further attempts to use statins  . Thyroid nodule     Family History  Problem Relation Age of Onset  . GER disease Mother   . Dementia Mother   . Breast cancer Mother   . Heart disease Father        MI at 63  . Scoliosis Daughter   . Colon cancer Neg Hx     Past Surgical History:  Procedure Laterality Date  . ABDOMINAL HYSTERECTOMY    . BACK SURGERY    . CHOLECYSTECTOMY    . FOOT SURGERY    . HERNIA REPAIR    . NASAL SINUS SURGERY     Social  History   Occupational History  . Not on file  Tobacco Use  . Smoking status: Never Smoker  . Smokeless tobacco: Never Used  Vaping Use  . Vaping Use: Never used  Substance and Sexual Activity  . Alcohol use: No  . Drug use: No  . Sexual activity: Not on file

## 2020-03-08 NOTE — Telephone Encounter (Signed)
I know we just saw her, but she is overdue for general follow-up/physical. She willing to come see me within the next 1 to 2 months?  Refill(s) sent to pharmacy.

## 2020-03-08 NOTE — Telephone Encounter (Signed)
Is it ok to refill this?

## 2020-03-10 NOTE — Telephone Encounter (Signed)
Messaged patient and called to schedule cpe. Letter sent.

## 2020-03-14 DIAGNOSIS — M62838 Other muscle spasm: Secondary | ICD-10-CM | POA: Diagnosis not present

## 2020-03-14 DIAGNOSIS — M545 Low back pain, unspecified: Secondary | ICD-10-CM | POA: Diagnosis not present

## 2020-03-14 DIAGNOSIS — M542 Cervicalgia: Secondary | ICD-10-CM | POA: Diagnosis not present

## 2020-03-14 DIAGNOSIS — M6281 Muscle weakness (generalized): Secondary | ICD-10-CM | POA: Diagnosis not present

## 2020-03-16 ENCOUNTER — Ambulatory Visit: Payer: Medicare Other | Admitting: Orthopaedic Surgery

## 2020-03-17 DIAGNOSIS — M542 Cervicalgia: Secondary | ICD-10-CM | POA: Diagnosis not present

## 2020-03-17 DIAGNOSIS — M62838 Other muscle spasm: Secondary | ICD-10-CM | POA: Diagnosis not present

## 2020-03-17 DIAGNOSIS — R262 Difficulty in walking, not elsewhere classified: Secondary | ICD-10-CM | POA: Diagnosis not present

## 2020-03-17 DIAGNOSIS — M6281 Muscle weakness (generalized): Secondary | ICD-10-CM | POA: Diagnosis not present

## 2020-03-21 DIAGNOSIS — M6281 Muscle weakness (generalized): Secondary | ICD-10-CM | POA: Diagnosis not present

## 2020-03-21 DIAGNOSIS — R102 Pelvic and perineal pain: Secondary | ICD-10-CM | POA: Diagnosis not present

## 2020-03-21 DIAGNOSIS — M542 Cervicalgia: Secondary | ICD-10-CM | POA: Diagnosis not present

## 2020-03-21 DIAGNOSIS — M62838 Other muscle spasm: Secondary | ICD-10-CM | POA: Diagnosis not present

## 2020-03-27 DIAGNOSIS — T8579XD Infection and inflammatory reaction due to other internal prosthetic devices, implants and grafts, subsequent encounter: Secondary | ICD-10-CM | POA: Diagnosis not present

## 2020-03-28 ENCOUNTER — Telehealth: Payer: Self-pay | Admitting: Orthopaedic Surgery

## 2020-03-28 DIAGNOSIS — M6281 Muscle weakness (generalized): Secondary | ICD-10-CM | POA: Diagnosis not present

## 2020-03-28 DIAGNOSIS — M62838 Other muscle spasm: Secondary | ICD-10-CM | POA: Diagnosis not present

## 2020-03-28 DIAGNOSIS — R102 Pelvic and perineal pain: Secondary | ICD-10-CM | POA: Diagnosis not present

## 2020-03-28 DIAGNOSIS — M545 Low back pain, unspecified: Secondary | ICD-10-CM | POA: Diagnosis not present

## 2020-03-28 NOTE — Telephone Encounter (Signed)
Patient called asked if she can get a cortisone injection in both knees when she come to her appointment tomorrow? The number to contact patient is [phone]

## 2020-03-28 NOTE — Telephone Encounter (Signed)
That will be fine since it will be close to 3 months since her last steroid injections.

## 2020-03-28 NOTE — Telephone Encounter (Signed)
Called and informed.

## 2020-03-29 ENCOUNTER — Ambulatory Visit (INDEPENDENT_AMBULATORY_CARE_PROVIDER_SITE_OTHER): Payer: Medicare Other | Admitting: Orthopaedic Surgery

## 2020-03-29 ENCOUNTER — Ambulatory Visit: Payer: Self-pay

## 2020-03-29 ENCOUNTER — Encounter: Payer: Self-pay | Admitting: Orthopaedic Surgery

## 2020-03-29 DIAGNOSIS — G8929 Other chronic pain: Secondary | ICD-10-CM | POA: Diagnosis not present

## 2020-03-29 DIAGNOSIS — M25561 Pain in right knee: Secondary | ICD-10-CM | POA: Diagnosis not present

## 2020-03-29 DIAGNOSIS — M79641 Pain in right hand: Secondary | ICD-10-CM | POA: Diagnosis not present

## 2020-03-29 DIAGNOSIS — M25562 Pain in left knee: Secondary | ICD-10-CM

## 2020-03-29 DIAGNOSIS — S62306A Unspecified fracture of fifth metacarpal bone, right hand, initial encounter for closed fracture: Secondary | ICD-10-CM

## 2020-03-29 MED ORDER — METHYLPREDNISOLONE ACETATE 40 MG/ML IJ SUSP
40.0000 mg | INTRAMUSCULAR | Status: AC | PRN
  Administered 2020-03-29: 40 mg via INTRA_ARTICULAR

## 2020-03-29 MED ORDER — LIDOCAINE HCL 1 % IJ SOLN
3.0000 mL | INTRAMUSCULAR | Status: AC | PRN
  Administered 2020-03-29: 3 mL

## 2020-03-29 NOTE — Progress Notes (Signed)
Office Visit Note   Patient: [name] [name]           Date of Birth: Jan 05, 1936           MRN: [id number] Visit Date: 03/29/2020              Requested by: [name], NP [place],  [place] [address] PCP: [name], NP   Assessment & Plan: Visit Diagnoses:  1. Pain in right hand   2. Closed fracture of fifth metacarpal bone of right hand, unspecified fracture morphology, initial encounter   3. Chronic pain of right knee   4. Chronic pain of left knee     Plan: As far as her hand goes on the right side, she can come in and out of the ulnar gutter splint as comfort allows and can stop wearing this if she would like.  From a knee standpoint, I did place steroid injections at her request in both knees.  We talked extensively about the potential for right knee replacement surgery.  She understands that her infection risk is certainly heightened given her chronic MRSA but this may be a quality of life issue as well.  She will think about this.  We will see her back in about 3 months.  All questions and concerns were answered addressed.  No x-rays are needed in 3 months.  Follow-Up Instructions: Return in about 3 months (around 06/27/2020).   Orders:  Orders Placed This Encounter  Procedures  . Large Joint Inj  . Large Joint Inj  . XR Hand Complete Right   No orders of the defined types were placed in this encounter.     Procedures: Large Joint Inj: R knee on 03/29/2020 9:42 AM Indications: diagnostic evaluation and pain Details: 22 G 1.5 in needle, superolateral approach  Arthrogram: No  Medications: 3 mL lidocaine 1 %; 40 mg methylPREDNISolone acetate 40 MG/ML Outcome: tolerated well, no immediate complications Procedure, treatment alternatives, risks and benefits explained, specific risks discussed. Consent was given by the patient. Immediately prior to procedure a time out was called to verify the correct patient, procedure, equipment, support  staff and site/side marked as required. Patient was prepped and draped in the usual sterile fashion.   Large Joint Inj: L knee on 03/29/2020 9:42 AM Indications: diagnostic evaluation and pain Details: 22 G 1.5 in needle, superolateral approach  Arthrogram: No  Medications: 3 mL lidocaine 1 %; 40 mg methylPREDNISolone acetate 40 MG/ML Outcome: tolerated well, no immediate complications Procedure, treatment alternatives, risks and benefits explained, specific risks discussed. Consent was given by the patient. Immediately prior to procedure a time out was called to verify the correct patient, procedure, equipment, support staff and site/side marked as required. Patient was prepped and draped in the usual sterile fashion.       Clinical Data: No additional findings.   Subjective: Chief Complaint  Patient presents with  . Right Hand - Follow-up  The patient is being seen again for her right hand as a relates to the base of the fifth metacarpal fracture that she sustained after mechanical fall about 6 weeks ago.  She also broke her nose now fall.  She is 84 years old.  She has significant arthritis in both her knees.  She has significant arthritis in her right hand and all the IP joints.  We have had her in a Velcro ulnar gutter wrist splint.  She is of low mobility and low physical  demand.  She states the pain is much better in her hand.  The knee pain is worse bothering her the most.  She is requesting steroid injections in both knees today and since it has been almost 3 months since her last steroid injections and the fact that she is not a diabetic I agree with placing them today.  She is someone who is under chronic suppression with oral antibiotics for an old chronic back infection I believe.  She is followed by [facility] for this and gets her antibiotics filled yearly by them and takes these daily.  They have talked her about quality of life issues as a relates to considering knee replacement  surgery.  Her right knee hurts worse than her left. HPI  Review of Systems She currently denies any headache, chest pain, shortness of breath, fever, chills, nausea, vomiting  Objective: Vital Signs: There were no vitals taken for this visit.  Physical Exam She is alert and orient x3 and in no acute distress Ortho Exam Examination of her right hand shows no bruising today.  She has minimal pain at the base of the fifth metacarpal on the right side.  All her fingers are deformed at the DIP joints.  Both knees hurt globally throughout the arc of motion with medial lateral joint line tenderness and patellofemoral crepitation. Specialty Comments:  No specialty comments available.  Imaging: XR Hand Complete Right  Result Date: 03/29/2020 3 views of the right hand show a healing base of the fifth metacarpal fracture.  There is some displacement of fracture.  There is severe end-stage arthritis in all the IP joints of the fingers with deformities.  There is severe arthritis of the basilar thumb joint.    PMFS History: Patient Active Problem List   Diagnosis Date Noted  . Hand pain, right 03/01/2020  . Bilateral lower extremity edema 09/27/2019  . Chronic nonintractable headache 04/21/2019  . Long term current use of antibiotics 03/30/2019  . Dysuria 03/30/2019  . 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 Chronic left shoulder pain 01/21/2018  . Chronic pain of left knee 01/21/2018  . Chronic pain of right knee 01/21/2018  . Osteoarthritis of glenohumeral joint, left 01/21/2018  . Decreased renal function 01/29/2017  . Anemia 01/29/2017  . Preventative health care 01/29/2017  . Other fatigue 09/03/2016  . Primary insomnia 09/03/2016  . Primary osteoarthritis of both knees 09/03/2016  . DJD (degenerative joint disease), cervical 09/03/2016  . MRSA infection 07/31/2016  . Chronic rhinitis 07/24/2016  . Sinusitis chronic, sphenoidal  07/24/2016  . Osteopenia 03/04/2016  . Palpitations 09/19/2014  . Thyroid nodule, uninodular 09/18/2011  . Hypothyroid 08/19/2011  . Postmenopausal HRT (hormone replacement therapy) 08/13/2011  . Endocarditis 04/24/2011  . Mitral regurgitation   . Lumbar spine pain   . Dyslipidemia   . Chest pain   . Statin intolerance   . Mitral valve prolapse   . Ejection fraction   . Fibromyalgia   . ADENOMATOUS COLONIC POLYP 08/15/2007  . GERD 08/15/2007  . BARRETTS ESOPHAGUS 08/15/2007  . HIATAL HERNIA 08/15/2007  . Osteoarthritis 08/15/2007  . CATARACT EXTRACTION, HX OF 08/15/2007  . Hiatal hernia 08/15/2007   Past Medical History:  Diagnosis Date  . Anxiety    prev on prozac  . Chest pain    Felt to be noncardiac in the past  . Chronic rectal fissure   . Colon polyp   . Dyslipidemia   . Ejection fraction  EF 60%, echo, 2007, atrial septum bows left to right compatible with increased left atrial pressure  . Fibromyalgia   . GERD (gastroesophageal reflux disease)    Barrett's esophagus  . Hiatal hernia   . Lumbar spine pain    Complex lumbar spine surgery at [facility], 0174, complicated, MRSA, much of the appliance is removed, patient on chronic antibiotics  . Melanoma ([place])   . Mitral regurgitation    Mild, 2007,  /  moderate or severe echo, September, 2012, eccentric jet wrapping the left atrium, consider TEE for further assessment  . Mitral valve prolapse    Mild mitral regurgitation echo, 2007  . Osteoarthritis   . Scoliosis   . Sinusitis    Multiple sinus operations in the past  . Statin intolerance    As of 2009, no further attempts to use statins  . Thyroid nodule     Family History  Problem Relation Age of Onset  . GER disease Mother   . Dementia Mother   . Breast cancer Mother   . Heart disease Father        MI at 54  . Scoliosis Daughter   . Colon cancer Neg Hx     Past Surgical History:  Procedure Laterality Date  . ABDOMINAL HYSTERECTOMY    . BACK  SURGERY    . CHOLECYSTECTOMY    . FOOT SURGERY    . HERNIA REPAIR    . NASAL SINUS SURGERY     Social History   Occupational History  . Not on file  Tobacco Use  . Smoking status: Never Smoker  . Smokeless tobacco: Never Used  Vaping Use  . Vaping Use: Never used  Substance and Sexual Activity  . Alcohol use: No  . Drug use: No  . Sexual activity: Not on file

## 2020-04-04 DIAGNOSIS — M542 Cervicalgia: Secondary | ICD-10-CM | POA: Diagnosis not present

## 2020-04-04 DIAGNOSIS — M545 Low back pain, unspecified: Secondary | ICD-10-CM | POA: Diagnosis not present

## 2020-04-04 DIAGNOSIS — M6281 Muscle weakness (generalized): Secondary | ICD-10-CM | POA: Diagnosis not present

## 2020-04-04 DIAGNOSIS — M62838 Other muscle spasm: Secondary | ICD-10-CM | POA: Diagnosis not present

## 2020-05-14 ENCOUNTER — Other Ambulatory Visit (INDEPENDENT_AMBULATORY_CARE_PROVIDER_SITE_OTHER): Payer: Self-pay | Admitting: Orthopaedic Surgery

## 2020-06-21 ENCOUNTER — Ambulatory Visit (INDEPENDENT_AMBULATORY_CARE_PROVIDER_SITE_OTHER): Payer: Medicare Other | Admitting: Orthopaedic Surgery

## 2020-06-21 ENCOUNTER — Encounter: Payer: Self-pay | Admitting: Orthopaedic Surgery

## 2020-06-21 DIAGNOSIS — M1712 Unilateral primary osteoarthritis, left knee: Secondary | ICD-10-CM

## 2020-06-21 DIAGNOSIS — M1711 Unilateral primary osteoarthritis, right knee: Secondary | ICD-10-CM | POA: Diagnosis not present

## 2020-06-21 MED ORDER — LIDOCAINE HCL 2 % IJ SOLN
4.0000 mL | INTRAMUSCULAR | Status: AC | PRN
  Administered 2020-06-21: 4 mL

## 2020-06-21 MED ORDER — METHYLPREDNISOLONE ACETATE 40 MG/ML IJ SUSP
40.0000 mg | INTRAMUSCULAR | Status: AC | PRN
  Administered 2020-06-21: 40 mg via INTRA_ARTICULAR

## 2020-06-21 NOTE — Progress Notes (Signed)
   Procedure Note  Patient: [name] [name]             Date of Birth: 1935/11/17           MRN: [id number]             Visit Date: 06/21/2020  HPI: Ms. Schuessler returns today wanting injections both knees.  She has known osteoarthritis both knees.  She also is here for follow-up of her right fifth metacarpal fracture.  She states the hand is sore but is getting better.  She is still considering right total knee arthroplasty.  However she did have a MRSA infection after back surgery some 10 years ago and is on chronic suppression.  She has had no fevers chills no recurrence of the MRSA infection.  She would like to discuss risk benefits of surgery today.  Patient is nondiabetic.  Review of systems see HPI otherwise negative noncontributory.  Physical exam: Bilateral knees good range of motion both knees significant patellofemoral crepitus both knees.  Global tenderness about the right knee with palpation.  No abnormal warmth erythema of either knee. Bilateral hands she has arthritic changes throughout all the fingers.  She has minimal tenderness over the fifth metacarpal.  She can almost make a complete fist on the right.  Procedures: Visit Diagnoses:  1. Unilateral primary osteoarthritis, right knee   2. Unilateral primary osteoarthritis, left knee     Large Joint Inj: bilateral knee on 06/21/2020 10:29 AM Indications: pain Details: 22 G 1.5 in needle, anterolateral approach  Arthrogram: No  Medications (Right): 4 mL lidocaine 2 %; 40 mg methylPREDNISolone acetate 40 MG/ML Medications (Left): 4 mL lidocaine 2 %; 40 mg methylPREDNISolone acetate 40 MG/ML Outcome: tolerated well, no immediate complications Procedure, treatment alternatives, risks and benefits explained, specific risks discussed. Consent was given by the patient. Immediately prior to procedure a time out was called to verify the correct patient, procedure, equipment, support staff and site/side marked as required. Patient was  prepped and draped in the usual sterile fashion.    Plan: Recommend she follow-up with [place] in 3 months.  Dr. Ninfa Linden I had a long discussion with her about knee surgery and the increased risk she is at given her history of MRSA and the fact that she is on chronic suppression with antibiotics.  She would like to talk to her family about right total knee replacement.  We will discuss this again in 3 months.  If she decides not to do surgery she can continue with the cortisone injections no more often than every 3 months.  Supplemental injections and given her no relief.

## 2020-07-15 ENCOUNTER — Other Ambulatory Visit (INDEPENDENT_AMBULATORY_CARE_PROVIDER_SITE_OTHER): Payer: Self-pay | Admitting: Orthopaedic Surgery

## 2020-07-15 ENCOUNTER — Other Ambulatory Visit: Payer: Self-pay | Admitting: Primary Care

## 2020-07-15 DIAGNOSIS — E785 Hyperlipidemia, unspecified: Secondary | ICD-10-CM

## 2020-07-17 NOTE — Telephone Encounter (Signed)
Refill

## 2020-08-23 ENCOUNTER — Telehealth: Payer: Self-pay

## 2020-08-23 NOTE — Telephone Encounter (Signed)
Pt called and starting on 08/21/20 had runny nose,H/a pain level now is 4 -5, ran fever not sure actual temp reading; dry cough,No SOB, vomiting or diarrhea. Scratchy S/T and no other covid symptoms. No known exposure to covid.  Pt is not any distress with breathing. Pt took home test today which was +.  Advised pt to self quarantine, drink plenty of fluids, rest and take tylenol is pikes a fever. Pt scheduled video visit with Dr Einar Pheasant on 08/24/20 at 4 PM. Sending note to Gentry Fitz NP as PCP and Dr Einar Pheasant. UC & ED precautions given and pt voiced understanding.

## 2020-08-23 NOTE — Telephone Encounter (Signed)
Noted and appreciate Dr. Verda Cumins evaluation.

## 2020-08-24 ENCOUNTER — Telehealth (INDEPENDENT_AMBULATORY_CARE_PROVIDER_SITE_OTHER): Payer: Medicare Other | Admitting: Family Medicine

## 2020-08-24 ENCOUNTER — Telehealth: Payer: Self-pay

## 2020-08-24 ENCOUNTER — Encounter: Payer: Self-pay | Admitting: Family Medicine

## 2020-08-24 VITALS — Ht <= 58 in | Wt 140.0 lb

## 2020-08-24 DIAGNOSIS — U071 COVID-19: Secondary | ICD-10-CM

## 2020-08-24 MED ORDER — BENZONATATE 100 MG PO CAPS: 100.0000 mg | ORAL_CAPSULE | Freq: Three times a day (TID) | ORAL | 0 refills | Status: DC | PRN

## 2020-08-24 MED ORDER — NIRMATRELVIR/RITONAVIR (PAXLOVID) TABLET (RENAL DOSING): 2.0000 | ORAL_TABLET | Freq: Two times a day (BID) | ORAL | 0 refills | Status: DC

## 2020-08-24 NOTE — Telephone Encounter (Signed)
Marivee from [facility] called to say the Paxlovid comes prepackaged with 30 tablets. It will explain on the sleeve how to take it. Said the easiest way to prescribe it is 30 tablets sig: Follow package directions. Needs it to be resent with corrected quantity and directions.

## 2020-08-24 NOTE — Patient Instructions (Signed)
Can wear a mask in public if needed starting Sunday, May 15.   Can end isolation starting May 20th

## 2020-08-24 NOTE — Progress Notes (Signed)
    I connected with [name] [name] on 08/24/20 at  4:00 PM EDT by video and verified that I am speaking with the correct person using two identifiers.   I discussed the limitations, risks, security and privacy concerns of performing an evaluation and management service by video and the availability of in person appointments. I also discussed with the patient that there may be a patient responsible charge related to this service. The patient expressed understanding and agreed to proceed.  Patient location: Home Provider Location: [place] Participants: Lesleigh Noe and ELIRA COLASANTI   Subjective:     [name] [name] is a 85 y.o. female presenting for Covid Positive (Onset of symptoms on Monday 08/21/20), Cough, and Sore Throat     HPI  #Covid  - started getting sick on 08/21/2020 - has gotten [name] her shots - Eating and drinking ok - loss of taste or smell: no  Treatment: tylenol mostly for pain, cough drops   Review of Systems  Constitutional: Negative for chills, fatigue and fever.  HENT: Positive for rhinorrhea and sore throat. Negative for congestion.   Respiratory: Positive for cough. Negative for shortness of breath.   Cardiovascular: Negative for chest pain.  Gastrointestinal: Positive for diarrhea. Negative for constipation and vomiting.  Musculoskeletal: Negative for arthralgias and myalgias.     Social History   Tobacco Use  Smoking Status Never Smoker  Smokeless Tobacco Never Used        Objective:   BP Readings from Last 3 Encounters:  03/01/20 118/74  02/21/20 (!) 155/86  02/08/20 110/70   Wt Readings from Last 3 Encounters:  08/24/20 140 lb (63.5 kg)  03/01/20 153 lb (69.4 kg)  02/08/20 156 lb (70.8 kg)   Ht 4\' 9"  (1.448 m)   Wt 140 lb (63.5 kg)   BMI 30.30 kg/m    Physical Exam Constitutional:      Appearance: Normal appearance. She is not ill-appearing.  HENT:     Head: Normocephalic and atraumatic.     Right Ear: External ear  normal.     Left Ear: External ear normal.  Eyes:     Conjunctiva/sclera: Conjunctivae normal.  Pulmonary:     Effort: Pulmonary effort is normal. No respiratory distress.  Neurological:     Mental Status: She is alert. Mental status is at baseline.  Psychiatric:        Mood and Affect: Mood normal.        Behavior: Behavior normal.        Thought Content: Thought content normal.        Judgment: Judgment normal.             Assessment & Plan:   Problem List Items Addressed This Visit   None   Visit Diagnoses    COVID-19 virus infection    -  Primary   Relevant Medications   nirmatrelvir/ritonavir EUA, renal dosing, (PAXLOVID) TABS   benzonatate (TESSALON PERLES) 100 MG capsule     GFR 42 so medication renally dose Last LFTs were >1 year ago but no hx of liver disease so reasonable to prescribe  Risk factors for severe illness: age 36 and obesity   ER precautions discussed  Return if symptoms worsen or fail to improve.  Lesleigh Noe, MD

## 2020-08-24 NOTE — Telephone Encounter (Signed)
Please call pharmacy back and inform them that this was written for renal dosing.   She should get #10 of Nirmatrelvir and #10 of ritonavir.   If unable to [facility] we will need to see if another pharmacy can dose appropriate for renal protection.   Will then need to see if Rhandi can get the prescription someplace else - [facility] will likely be a safe option

## 2020-08-24 NOTE — Telephone Encounter (Signed)
Please see note from today 

## 2020-08-25 MED ORDER — NIRMATRELVIR/RITONAVIR (PAXLOVID) TABLET (RENAL DOSING): 2.0000 | ORAL_TABLET | Freq: Two times a day (BID) | ORAL | 0 refills | Status: AC

## 2020-08-25 NOTE — Addendum Note (Signed)
Addended by: Lurlean Nanny on: 08/25/2020 09:11 AM   Modules accepted: Orders

## 2020-08-25 NOTE — Telephone Encounter (Signed)
Discussed with Dr. Einar Pheasant via phone. Pt should take medication as prescribed on the discontinued prescription, take one tablet (150mg , nirmatrelvir) twice daily for 5 days and take one tablet (100mg , ritonavir) twice daily for 5 days.  Dr. Einar Pheasant is concerned the pharmacy will only dispense 30 tablets with the packaging it comes in. She would prefer to dispense #20 if possible from another pharmacy. Will discuss with PCP in office since Dr. Einar Pheasant is not in the office today.    Contacted pt who reports her husband picked up the paxlovid this morning and she started taking the medication this morning. She reports the pharmacist explained how to take it. They told her to take one blue pill and one gold pill twice a day for five days. They also told her there would be more pills than she needs in the package and that she is not to take the extra pills after the 5 days. They advised to dispose of the extra pills.  Inquired how pt was feeling. Pt reports she still has a runny nose and cough but no symptoms have worsened. Advised if any symptoms do worsen to contact the office. Advised if any questions over the weekend she can call the clinic number and talk to a nurse and they would advise her. Advised pt of ER precautions. Pt verbalized understanding and pt was appreciative of call.

## 2020-08-25 NOTE — Telephone Encounter (Signed)
Appreciate pharmacy education and nursing support.

## 2020-08-28 ENCOUNTER — Ambulatory Visit: Payer: Medicare Other | Admitting: Family Medicine

## 2020-09-04 ENCOUNTER — Telehealth (INDEPENDENT_AMBULATORY_CARE_PROVIDER_SITE_OTHER): Payer: Medicare Other | Admitting: Family Medicine

## 2020-09-04 ENCOUNTER — Encounter: Payer: Self-pay | Admitting: Family Medicine

## 2020-09-04 ENCOUNTER — Telehealth: Payer: Self-pay

## 2020-09-04 VITALS — Wt 140.0 lb

## 2020-09-04 DIAGNOSIS — U071 COVID-19: Secondary | ICD-10-CM

## 2020-09-04 DIAGNOSIS — J0110 Acute frontal sinusitis, unspecified: Secondary | ICD-10-CM

## 2020-09-04 DIAGNOSIS — K449 Diaphragmatic hernia without obstruction or gangrene: Secondary | ICD-10-CM | POA: Diagnosis not present

## 2020-09-04 MED ORDER — AMOXICILLIN-POT CLAVULANATE 875-125 MG PO TABS
1.0000 | ORAL_TABLET | Freq: Two times a day (BID) | ORAL | 0 refills | Status: AC
Start: 2020-09-04 — End: 2020-09-11

## 2020-09-04 NOTE — Assessment & Plan Note (Signed)
Notes worsening since paxlovid. Advised course of omeprazole instead of pepcid for 2 weeks.

## 2020-09-04 NOTE — Progress Notes (Signed)
I connected with [name] [name] on 09/04/20 at  4:00 PM EDT by video and verified that I am speaking with the correct person using two identifiers.   I discussed the limitations, risks, security and privacy concerns of performing an evaluation and management service by video and the availability of in person appointments. I also discussed with the patient that there may be a patient responsible charge related to this service. The patient expressed understanding and agreed to proceed.  Patient location: Home Provider Location: [facility] Participants: Lesleigh Noe and Kirk Ruths   Subjective:     [name] [name] is a 85 y.o. female presenting for Headache and Nasal Congestion     HPI  #Covid infection  - had severe side effects to paxlovid so was unable to complete this - was unable to complete this - 08/21/2020 are when symptoms started - 2 weeks ago today - continues to have HA and nasal congestion - symptoms overall are better than previously - initially with cough and runny nose  - has been taking cough medication prescribed and this is improved - no longer runny nose but congestion  - hx of ocular migraines - has been having a daily headache  Tylenol sinus w/ improvement Cannot take anti-inflammatory     Review of Systems  Constitutional: Negative for chills and fever.  HENT: Positive for congestion. Negative for sinus pressure and sinus pain.   Respiratory: Negative for cough.   Neurological: Positive for headaches.     Social History   Tobacco Use  Smoking Status Never Smoker  Smokeless Tobacco Never Used        Objective:   BP Readings from Last 3 Encounters:  03/01/20 118/74  02/21/20 (!) 155/86  02/08/20 110/70   Wt Readings from Last 3 Encounters:  09/04/20 140 lb (63.5 kg)  08/24/20 140 lb (63.5 kg)  03/01/20 153 lb (69.4 kg)    Wt 140 lb (63.5 kg)   BMI 30.30 kg/m    Physical Exam Constitutional:      Appearance: Normal  appearance. She is not ill-appearing.  HENT:     Head: Normocephalic and atraumatic.     Right Ear: External ear normal.     Left Ear: External ear normal.  Eyes:     Conjunctiva/sclera: Conjunctivae normal.  Pulmonary:     Effort: Pulmonary effort is normal. No respiratory distress.  Neurological:     Mental Status: She is alert. Mental status is at baseline.  Psychiatric:        Mood and Affect: Mood normal.        Behavior: Behavior normal.        Thought Content: Thought content normal.        Judgment: Judgment normal.            Assessment & Plan:   Problem List Items Addressed This Visit      Respiratory   Diaphragmatic hernia    Notes worsening since paxlovid. Advised course of omeprazole instead of pepcid for 2 weeks.        Other Visit Diagnoses    Acute non-recurrent frontal sinusitis    -  Primary   Relevant Medications   amoxicillin-clavulanate (AUGMENTIN) 875-125 MG tablet   COVID-19 virus infection         Discussed it could be resolving covid. Abx if not improved for possible sinus infection   Return if symptoms worsen or fail to improve.  Jobe Marker  Einar Pheasant, MD

## 2020-09-04 NOTE — Telephone Encounter (Signed)
Is patient willing to do a virtual visit to discuss ongoing symptoms and expectations. I can see her today

## 2020-09-04 NOTE — Telephone Encounter (Signed)
Patient called stating that she had a virtual visit with Dr Einar Pheasant on 08/24/20 and she was giving medication for COVID but patient states she could not take it after trying for 1 day due to severe nausea, abdominal pain/kidney pain. Patient states she re tested for COVID at home yesterday and she is still positive. Patient is concerned that she is not getting rid of this. Patient asked if there was another medication patient can take? Patient is still having a lot of head congestion, fatigue, extreme headache, has been taking tylenol and tylenol sinus and that helps symptoms some but does not resolve them. Cough is better.   Patient is concerned that she is still testing positive for COVID and she had had all her vaccines and booster.

## 2020-09-04 NOTE — Telephone Encounter (Signed)
Pt scheduled for virtual today with Dr. Einar Pheasant

## 2020-09-07 ENCOUNTER — Other Ambulatory Visit: Payer: Self-pay | Admitting: Primary Care

## 2020-09-07 DIAGNOSIS — D649 Anemia, unspecified: Secondary | ICD-10-CM

## 2020-09-07 DIAGNOSIS — E039 Hypothyroidism, unspecified: Secondary | ICD-10-CM

## 2020-09-07 DIAGNOSIS — E785 Hyperlipidemia, unspecified: Secondary | ICD-10-CM

## 2020-09-19 ENCOUNTER — Other Ambulatory Visit (INDEPENDENT_AMBULATORY_CARE_PROVIDER_SITE_OTHER): Payer: Medicare Other

## 2020-09-19 ENCOUNTER — Other Ambulatory Visit: Payer: Self-pay

## 2020-09-19 ENCOUNTER — Ambulatory Visit (INDEPENDENT_AMBULATORY_CARE_PROVIDER_SITE_OTHER): Payer: Medicare Other

## 2020-09-19 DIAGNOSIS — D649 Anemia, unspecified: Secondary | ICD-10-CM | POA: Diagnosis not present

## 2020-09-19 DIAGNOSIS — E039 Hypothyroidism, unspecified: Secondary | ICD-10-CM | POA: Diagnosis not present

## 2020-09-19 DIAGNOSIS — E785 Hyperlipidemia, unspecified: Secondary | ICD-10-CM | POA: Diagnosis not present

## 2020-09-19 DIAGNOSIS — Z0000 Encounter for general adult medical examination without abnormal findings: Secondary | ICD-10-CM | POA: Diagnosis not present

## 2020-09-19 LAB — COMPREHENSIVE METABOLIC PANEL
ALT: 12 U/L (ref 0–35)
AST: 18 U/L (ref 0–37)
Albumin: 4.5 g/dL (ref 3.5–5.2)
Alkaline Phosphatase: 50 U/L (ref 39–117)
BUN: 23 mg/dL (ref 6–23)
CO2: 27 mEq/L (ref 19–32)
Calcium: 9.4 mg/dL (ref 8.4–10.5)
Chloride: 103 mEq/L (ref 96–112)
Creatinine, Ser: 1.03 mg/dL (ref 0.40–1.20)
GFR: 50.33 mL/min — ABNORMAL LOW (ref >60.00)
Glucose, Bld: 102 mg/dL — ABNORMAL HIGH (ref 70–99)
Potassium: 4.3 mEq/L (ref 3.5–5.1)
Sodium: 140 mEq/L (ref 135–145)
Total Bilirubin: 0.5 mg/dL (ref 0.2–1.2)
Total Protein: 6.4 g/dL (ref 6.0–8.3)

## 2020-09-19 LAB — CBC
HCT: 34.5 % — ABNORMAL LOW (ref 36.0–46.0)
Hemoglobin: 11.5 g/dL — ABNORMAL LOW (ref 12.0–15.0)
MCHC: 33.2 g/dL (ref 30.0–36.0)
MCV: 94.3 fl (ref 78.0–100.0)
Platelets: 190 10*3/uL (ref 150.0–400.0)
RBC: 3.66 Mil/uL — ABNORMAL LOW (ref 3.87–5.11)
RDW: 14.6 % (ref 11.5–15.5)
WBC: 3.6 10*3/uL — ABNORMAL LOW (ref 4.0–10.5)

## 2020-09-19 LAB — LIPID PANEL
Cholesterol: 167 mg/dL (ref 0–200)
HDL: 59.7 mg/dL (ref >39.00)
LDL Cholesterol: 76 mg/dL (ref 0–99)
NonHDL: 107.22
Total CHOL/HDL Ratio: 3
Triglycerides: 156 mg/dL — ABNORMAL HIGH (ref 0.0–149.0)
VLDL: 31.2 mg/dL (ref 0.0–40.0)

## 2020-09-19 LAB — TSH: TSH: 3.02 u[IU]/mL (ref 0.35–4.50)

## 2020-09-19 NOTE — Progress Notes (Signed)
PCP notes:  Health Maintenance: dexa- due shingrix- due    Abnormal Screenings: none   Patient concerns: none   Nurse concerns: none   Next PCP appt.: 09/26/2020 @ 9:20 am

## 2020-09-19 NOTE — Patient Instructions (Signed)
[name] [name] , Thank you for taking time to come for your Medicare Wellness Visit. I appreciate your ongoing commitment to your health goals. Please review the following plan we discussed and let me know if I can assist you in the future.   Screening recommendations/referrals: Colonoscopy: no longer required  Mammogram: Up to date, completed 11/30/2019, due 11/2020 Bone Density: due, will have this done through her rheumatologist  Recommended yearly ophthalmology/optometry visit for glaucoma screening and checkup Recommended yearly dental visit for hygiene and checkup  Vaccinations: Influenza vaccine: Up to date, completed 03/01/2020, due 11/2020 Pneumococcal vaccine: Completed series Tdap vaccine: Up to date, completed 10/22/2017, due 10/2027 Shingles vaccine: due, check with your insurance regarding coverage if interested    Covid-19:Completed series  Advanced directives: Advance directive discussed with you today. Even though you declined this today please call our office should you change your mind and we can give you the proper paperwork for you to fill out.  Conditions/risks identified: dyslipidemia   Next appointment: Follow up in one year for your annual wellness visit    Preventive Care 65 Years and Older, Female Preventive care refers to lifestyle choices and visits with your health care provider that can promote health and wellness. What does preventive care include?  A yearly physical exam. This is also called an annual well check.  Dental exams once or twice a year.  Routine eye exams. Ask your health care provider how often you should have your eyes checked.  Personal lifestyle choices, including:  Daily care of your teeth and gums.  Regular physical activity.  Eating a healthy diet.  Avoiding tobacco and drug use.  Limiting alcohol use.  Practicing safe sex.  Taking low-dose aspirin every day.  Taking vitamin and mineral supplements as recommended by your  health care provider. What happens during an annual well check? The services and screenings done by your health care provider during your annual well check will depend on your age, overall health, lifestyle risk factors, and family history of disease. Counseling  Your health care provider may ask you questions about your:  Alcohol use.  Tobacco use.  Drug use.  Emotional well-being.  Home and relationship well-being.  Sexual activity.  Eating habits.  History of falls.  Memory and ability to understand (cognition).  Work and work Statistician.  Reproductive health. Screening  You may have the following tests or measurements:  Height, weight, and BMI.  Blood pressure.  Lipid and cholesterol levels. These may be checked every 5 years, or more frequently if you are over 25 years old.  Skin check.  Lung cancer screening. You may have this screening every year starting at age 73 if you have a 30-pack-year history of smoking and currently smoke or have quit within the past 15 years.  Fecal occult blood test (FOBT) of the stool. You may have this test every year starting at age 32.  Flexible sigmoidoscopy or colonoscopy. You may have a sigmoidoscopy every 5 years or a colonoscopy every 10 years starting at age 25.  Hepatitis C blood test.  Hepatitis B blood test.  Sexually transmitted disease (STD) testing.  Diabetes screening. This is done by checking your blood sugar (glucose) after you have not eaten for a while (fasting). You may have this done every 1-3 years.  Bone density scan. This is done to screen for osteoporosis. You may have this done starting at age 75.  Mammogram. This may be done every 1-2 years. Talk to your health  care provider about how often you should have regular mammograms. Talk with your health care provider about your test results, treatment options, and if necessary, the need for more tests. Vaccines  Your health care provider may recommend  certain vaccines, such as:  Influenza vaccine. This is recommended every year.  Tetanus, diphtheria, and acellular pertussis (Tdap, Td) vaccine. You may need a Td booster every 10 years.  Zoster vaccine. You may need this after age 58.  Pneumococcal 13-valent conjugate (PCV13) vaccine. One dose is recommended after age 39.  Pneumococcal polysaccharide (PPSV23) vaccine. One dose is recommended after age 51. Talk to your health care provider about which screenings and vaccines you need and how often you need them. This information is not intended to replace advice given to you by your health care provider. Make sure you discuss any questions you have with your health care provider. Document Released: 04/28/2015 Document Revised: 12/20/2015 Document Reviewed: 01/31/2015 Elsevier Interactive Patient Education  2017 [place] Prevention in the Home Falls can cause injuries. They can happen to people of all ages. There are many things you can do to make your home safe and to help prevent falls. What can I do on the outside of my home?  Regularly fix the edges of walkways and driveways and fix any cracks.  Remove anything that might make you trip as you walk through a door, such as a raised step or threshold.  Trim any bushes or trees on the path to your home.  Use bright outdoor lighting.  Clear any walking paths of anything that might make someone trip, such as rocks or tools.  Regularly check to see if handrails are loose or broken. Make sure that both sides of any steps have handrails.  Any raised decks and porches should have guardrails on the edges.  Have any leaves, snow, or ice cleared regularly.  Use sand or salt on walking paths during winter.  Clean up any spills in your garage right away. This includes oil or grease spills. What can I do in the bathroom?  Use night lights.  Install grab bars by the toilet and in the tub and shower. Do not use towel bars as  grab bars.  Use non-skid mats or decals in the tub or shower.  If you need to sit down in the shower, use a plastic, non-slip stool.  Keep the floor dry. Clean up any water that spills on the floor as soon as it happens.  Remove soap buildup in the tub or shower regularly.  Attach bath mats securely with double-sided non-slip rug tape.  Do not have throw rugs and other things on the floor that can make you trip. What can I do in the bedroom?  Use night lights.  Make sure that you have a light by your bed that is easy to reach.  Do not use any sheets or blankets that are too big for your bed. They should not hang down onto the floor.  Have a firm chair that has side arms. You can use this for support while you get dressed.  Do not have throw rugs and other things on the floor that can make you trip. What can I do in the kitchen?  Clean up any spills right away.  Avoid walking on wet floors.  Keep items that you use a lot in easy-to-reach places.  If you need to reach something above you, use a strong step stool that has a grab  bar.  Keep electrical cords out of the way.  Do not use floor polish or wax that makes floors slippery. If you must use wax, use non-skid floor wax.  Do not have throw rugs and other things on the floor that can make you trip. What can I do with my stairs?  Do not leave any items on the stairs.  Make sure that there are handrails on both sides of the stairs and use them. Fix handrails that are broken or loose. Make sure that handrails are as long as the stairways.  Check any carpeting to make sure that it is firmly attached to the stairs. Fix any carpet that is loose or worn.  Avoid having throw rugs at the top or bottom of the stairs. If you do have throw rugs, attach them to the floor with carpet tape.  Make sure that you have a light switch at the top of the stairs and the bottom of the stairs. If you do not have them, ask someone to add them  for you. What else can I do to help prevent falls?  Wear shoes that:  Do not have high heels.  Have rubber bottoms.  Are comfortable and fit you well.  Are closed at the toe. Do not wear sandals.  If you use a stepladder:  Make sure that it is fully opened. Do not climb a closed stepladder.  Make sure that both sides of the stepladder are locked into place.  Ask someone to hold it for you, if possible.  Clearly mark and make sure that you can see:  Any grab bars or handrails.  First and last steps.  Where the edge of each step is.  Use tools that help you move around (mobility aids) if they are needed. These include:  Canes.  Walkers.  Scooters.  Crutches.  Turn on the lights when you go into a dark area. Replace any light bulbs as soon as they burn out.  Set up your furniture so you have a clear path. Avoid moving your furniture around.  If any of your floors are uneven, fix them.  If there are any pets around you, be aware of where they are.  Review your medicines with your doctor. Some medicines can make you feel dizzy. This can increase your chance of falling. Ask your doctor what other things that you can do to help prevent falls. This information is not intended to replace advice given to you by your health care provider. Make sure you discuss any questions you have with your health care provider. Document Released: 01/26/2009 Document Revised: 09/07/2015 Document Reviewed: 05/06/2014 Elsevier Interactive Patient Education  2017 [employer].

## 2020-09-19 NOTE — Progress Notes (Signed)
Subjective:   [name] [name] is a 85 y.o. female who presents for Medicare Annual (Subsequent) preventive examination.  Review of Systems: N/A     I connected with the patient today by telephone and verified that I am speaking with the correct person using two identifiers. Location patient: home Location nurse: work Persons participating in the telephone visit: patient, nurse.   I discussed the limitations, risks, security and privacy concerns of performing an evaluation and management service by telephone and the availability of in person appointments. I also discussed with the patient that there may be a patient responsible charge related to this service. The patient expressed understanding and verbally consented to this telephonic visit.        Cardiac Risk Factors include: advanced age (>37men, >17 women);dyslipidemia     Objective:    Today's Vitals   There is no height or weight on file to calculate BMI.  Advanced Directives 09/19/2020 02/05/2019 01/27/2017  Does Patient Have a Medical Advance Directive? No No No  Would patient like information on creating a medical advance directive? No - Patient declined No - Patient declined -    Current Medications (verified) Outpatient Encounter Medications as of 09/19/2020  Medication Sig  . ALPHA LIPOIC ACID PO Take 1 tablet by mouth daily. Daily  . benzonatate (TESSALON PERLES) 100 MG capsule Take 1 capsule (100 mg total) by mouth 3 (three) times daily as needed for cough.  . Biotin 10 MG TABS Take 1 tablet by mouth daily. Daily  . calcium carbonate (OS-CAL) 600 MG TABS Take 1,200 mg by mouth 2 (two) times daily with a meal.  . cholecalciferol (VITAMIN D) 1000 UNITS tablet Take 1,000 Units by mouth daily.  . clotrimazole-betamethasone (LOTRISONE) cream APPLY TOPICALLY 2 (TWO) TIMES DAILY AS NEEDED.  Marland Kitchen diclofenac sodium (VOLTAREN) 1 % GEL Apply 2-4 grams to affected area up to 4 times daily (Patient taking differently: Apply 2-4 g  topically 4 (four) times daily as needed (pain).)  . famotidine (PEPCID) 20 MG tablet Take 1 tablet (20 mg total) by mouth daily. for heartburn. (Patient taking differently: Take 20 mg by mouth daily as needed for heartburn or indigestion.)  . fenofibrate micronized (LOFIBRA) 134 MG capsule TAKE 1 CAPSULE (134 MG TOTAL) BY MOUTH AT BEDTIME. FOR CHOLESTEROL.  . fish oil-omega-3 fatty acids 1000 MG capsule Take 2 g by mouth daily.  Marland Kitchen gabapentin (NEURONTIN) 100 MG capsule TAKE 1 CAPSULE BY MOUTH THREE TIMES A DAY  . Ginger, Zingiber officinalis, (GINGER PO) Take 1 tablet by mouth daily.  Marland Kitchen levothyroxine (SYNTHROID) 50 MCG tablet TAKE 1 TABLET BY MOUTH EVERY MORNING WITH WATER ON AN EMPTY STOMACH. NO OTHER FOODS/MEDS FOR 30 MINS  . NON FORMULARY Take 1 capsule by mouth daily. Tumeric      Daily   . sulfamethoxazole-trimethoprim (BACTRIM DS) 800-160 MG tablet Take 1 tablet by mouth daily.  Marland Kitchen tretinoin (RETIN-A) 0.05 % cream Apply 1 application topically daily as needed (as directed per dermatologist).   . metoprolol tartrate (LOPRESSOR) 25 MG tablet Take 1 tablet (25 mg total) by mouth as directed. Take 1 tablet 25 mg twice a day and extra tablet as needed for breakthrough palpitations.  . [DISCONTINUED] estrogens, conjugated, (PREMARIN) 0.3 MG tablet Take 1 tablet (0.3 mg total) by mouth 2 (two) times a week.   No facility-administered encounter medications on file as of 09/19/2020.    Allergies (verified) Other, Fenofibric acid, Nsaids, Statins, Trilipix [choline fenofibrate], and Wasp venom  History: Past Medical History:  Diagnosis Date  . Anxiety    prev on prozac  . Chest pain    Felt to be noncardiac in the past  . Chronic rectal fissure   . Colon polyp   . Dyslipidemia   . Ejection fraction    EF 60%, echo, 2007, atrial septum bows left to right compatible with increased left atrial pressure  . Fibromyalgia   . GERD (gastroesophageal reflux disease)    Barrett's esophagus  .  Hiatal hernia   . Lumbar spine pain    Complex lumbar spine surgery at [facility], 2376, complicated, MRSA, much of the appliance is removed, patient on chronic antibiotics  . Melanoma ([place])   . Mitral regurgitation    Mild, 2007,  /  moderate or severe echo, September, 2012, eccentric jet wrapping the left atrium, consider TEE for further assessment  . Mitral valve prolapse    Mild mitral regurgitation echo, 2007  . Osteoarthritis   . Scoliosis   . Sinusitis    Multiple sinus operations in the past  . Statin intolerance    As of 2009, no further attempts to use statins  . Thyroid nodule    Past Surgical History:  Procedure Laterality Date  . ABDOMINAL HYSTERECTOMY    . BACK SURGERY    . CHOLECYSTECTOMY    . FOOT SURGERY    . HERNIA REPAIR    . NASAL SINUS SURGERY     Family History  Problem Relation Age of Onset  . GER disease Mother   . Dementia Mother   . Breast cancer Mother   . Heart disease Father        MI at 41  . Scoliosis Daughter   . Colon cancer Neg Hx    Social History   Socioeconomic History  . Marital status: Married    Spouse name: Not on file  . Number of children: Not on file  . Years of education: Not on file  . Highest education level: Not on file  Occupational History  . Not on file  Tobacco Use  . Smoking status: Never Smoker  . Smokeless tobacco: Never Used  Vaping Use  . Vaping Use: Never used  Substance and Sexual Activity  . Alcohol use: No  . Drug use: No  . Sexual activity: Not on file  Other Topics Concern  . Not on file  Social History Narrative   From [place]   Married (second [phone]   3 kids, 2 of whom are local   Retired, Camera operator   Social Determinants of Health   Financial Resource Strain: [place]   . Difficulty of Paying Living Expenses: Not hard at all  Food Insecurity: No Food Insecurity  . Worried About Charity fundraiser in the Last Year: Never true  . Ran Out of Food in the Last Year: Never true   Transportation Needs: No Transportation Needs  . Lack of Transportation (Medical): No  . Lack of Transportation (Non-Medical): No  Physical Activity: Insufficiently Active  . Days of Exercise per Week: 3 days  . Minutes of Exercise per Session: 40 min  Stress: No Stress Concern Present  . Feeling of Stress : Not at all  Social Connections: Not on file    Tobacco Counseling Counseling given: Not Answered   Clinical Intake:  Pre-visit preparation completed: Yes  Pain : No/denies pain     Nutritional Risks: None Diabetes: No  How often do you need  to have someone help you when you read instructions, pamphlets, or other written materials from your doctor or pharmacy?: 1 - Never What is the last grade level you completed in school?: 2 years of college  Diabetic: No Nutrition Risk Assessment:  Has the patient had any N/V/D within the last 2 months?  No  Does the patient have any non-healing wounds?  No  Has the patient had any unintentional weight loss or weight gain?  No   Diabetes:  Is the patient diabetic?  No  If diabetic, was a CBG obtained today?  N/A Did the patient bring in their glucometer from home?  N/A How often do you monitor your CBG's? N/A.   Financial Strains and Diabetes Management:  Are you having any financial strains with the device, your supplies or your medication? N/A.  Does the patient want to be seen by Chronic Care Management for management of their diabetes?  N/A Would the patient like to be referred to a Nutritionist or for Diabetic Management?  N/A Interpreter Needed?: No  Information entered by :: CJohnson, LPN   Activities of Daily Living In your present state of health, do you have any difficulty performing the following activities: 09/19/2020 03/01/2020  Hearing? N N  Vision? N N  Difficulty concentrating or making decisions? N N  Walking or climbing stairs? N N  Dressing or bathing? N N  Doing errands, shopping? N N  Preparing  Food and eating ? N -  Using the Toilet? N -  In the past six months, have you accidently leaked urine? N -  Do you have problems with loss of bowel control? N -  Managing your Medications? N -  Managing your Finances? N -  Housekeeping or managing your Housekeeping? N -  Some recent data might be hidden    Patient Care Team: [name], NP as PCP - General (Internal Medicine)  Indicate any recent Medical Services you may have received from other than [facility] providers in the past year (date may be approximate).     Assessment:   This is a routine wellness examination for [name] [name].  Hearing/Vision screen  Hearing Screening   125Hz  250Hz  500Hz  1000Hz  2000Hz  3000Hz  4000Hz  6000Hz  8000Hz   Right ear:           Left ear:           Vision Screening Comments: Advised patient to get annual eye exams   Dietary issues and exercise activities discussed: Current Exercise Habits: Home exercise routine, Type of exercise: Other - see comments (recumbent bike), Time (Minutes): 40, Frequency (Times/Week): 3, Weekly Exercise (Minutes/Week): 120, Intensity: Moderate, Exercise limited by: None identified  Goals Addressed            This Visit's Progress   . Patient Stated       09/19/2020, I will continue to ride my recumbent bike 3 days a week for 30 minutes.       Depression Screen PHQ 2/9 Scores 09/19/2020 03/01/2020 02/05/2019 02/03/2018 01/27/2017  PHQ - 2 Score 0 0 0 0 0  PHQ- 9 Score 0 0 0 - 0    Fall Risk Fall Risk  09/19/2020 03/01/2020 02/05/2019 02/03/2018 01/27/2017  Falls in the past year? 1 1 0 No No  Number falls in past yr: 0 0 0 - -  Injury with Fall? 1 1 0 - -  Risk for fall due to : Medication side effect Impaired balance/gait Medication side effect - -  Follow  up Falls evaluation completed;Falls prevention discussed - Falls evaluation completed;Falls prevention discussed - -    FALL RISK PREVENTION PERTAINING TO THE HOME:  Any stairs in or around the home? Yes  If  so, are there any without handrails? No  Home free of loose throw rugs in walkways, pet beds, electrical cords, etc? Yes  Adequate lighting in your home to reduce risk of falls? Yes   ASSISTIVE DEVICES UTILIZED TO PREVENT FALLS:  Life alert? No  Use of a cane, walker or w/c? Yes  Grab bars in the bathroom? No  Shower chair or bench in shower? No  Elevated toilet seat or a handicapped toilet? No   TIMED UP AND GO:  Was the test performed? N/A telephone visit .    Cognitive Function: MMSE - Mini Mental State Exam 09/19/2020 02/05/2019 01/27/2017  Not completed: Refused - -  Orientation to time - 5 5  Orientation to Place - 5 5  Registration - 3 3  Attention/ Calculation - 5 0  Recall - 3 3  Language- name 2 objects - - 0  Language- repeat - 1 1  Language- follow 3 step command - - 3  Language- read & follow direction - - 0  Write a sentence - - 0  Copy design - - 0  Total score - - 20  Mini Cog  Mini-Cog screen was not completed. Patient refused. Maximum score is 22. A value of 0 denotes this part of the MMSE was not completed or the patient failed this part of the Mini-Cog screening.       Immunizations Immunization History  Administered Date(s) Administered  . Fluad Quad(high Dose 65+) 03/01/2020  . Influenza Split 02/03/2012  . Influenza,inj,Quad PF,6+ Mos 02/20/2017, 02/03/2018, 02/10/2019  . Influenza-Unspecified 02/02/2016, 01/13/2017, 02/02/2018  . Moderna Sars-Covid-2 Vaccination 04/26/2019, 05/25/2019, 02/11/2020, 08/01/2020  . Pneumococcal Conjugate-13 02/10/2019  . Pneumococcal Polysaccharide-23 02/03/2018  . Td 02/03/2016  . Tdap 10/22/2017    TDAP status: Up to date  Flu Vaccine status: Up to date  Pneumococcal vaccine status: Up to date  Covid-19 vaccine status: Completed vaccines  Qualifies for Shingles Vaccine? Yes   Zostavax completed No   Shingrix Completed?: No.    Education has been provided regarding the importance of this vaccine.  Patient has been advised to call insurance company to determine out of pocket expense if they have not yet received this vaccine. Advised may also receive vaccine at local pharmacy or Health Dept. Verbalized acceptance and understanding.  Screening Tests Health Maintenance  Topic Date Due  . Pneumococcal Vaccine 64-24 Years old (1 of 4 - PCV13) Never done  . Zoster Vaccines- Shingrix (1 of 2) Never done  . INFLUENZA VACCINE  11/13/2020  . TETANUS/TDAP  10/23/2027  . DEXA SCAN  Completed  . COVID-19 Vaccine  Completed  . PNA vac Low Risk Adult  Completed  . HPV VACCINES  Aged Out    Health Maintenance  Health Maintenance Due  Topic Date Due  . Pneumococcal Vaccine 21-69 Years old (1 of 4 - PCV13) Never done  . Zoster Vaccines- Shingrix (1 of 2) Never done    Colorectal cancer screening: No longer required.   Mammogram status: Completed 11/30/2019. Repeat every year  Bone Density status: due, will have this completed through her rheumatologist   Lung Cancer Screening: (Low Dose CT Chest recommended if Age 46-80 years, 30 pack-year currently smoking OR have quit w/in 15 years.) does not qualify.   Additional Screening:  Hepatitis C Screening: does not qualify; Completed N/A  Vision Screening: Recommended annual ophthalmology exams for early detection of glaucoma and other disorders of the eye. Is the patient up to date with their annual eye exam?  No  Who is the provider or what is the name of the office in which the patient attends annual eye exams? Does not see eye doctor regularly, had cataract surgery years ago and is doing fine If pt is not established with a provider, would they like to be referred to a provider to establish care? No .   Dental Screening: Recommended annual dental exams for proper oral hygiene  Community Resource Referral / Chronic Care Management: CRR required this visit?  No   CCM required this visit?  No      Plan:     I have personally reviewed  and noted the following in the patient's chart:   . Medical and social history . Use of alcohol, tobacco or illicit drugs  . Current medications and supplements including opioid prescriptions.  . Functional ability and status . Nutritional status . Physical activity . Advanced directives . List of other physicians . Hospitalizations, surgeries, and ER visits in previous 12 months . Vitals . Screenings to include cognitive, depression, and falls . Referrals and appointments  In addition, I have reviewed and discussed with patient certain preventive protocols, quality metrics, and best practice recommendations. A written personalized care plan for preventive services as well as general preventive health recommendations were provided to patient.   Due to this being a telephonic visit, the after visit summary with patients personalized plan was offered to patient via office or my-chart. Patient preferred to pick up at office at next visit or via mychart.   Andrez Grime, LPN   05/25/5619

## 2020-09-20 ENCOUNTER — Encounter: Payer: Self-pay | Admitting: Orthopaedic Surgery

## 2020-09-20 ENCOUNTER — Ambulatory Visit (INDEPENDENT_AMBULATORY_CARE_PROVIDER_SITE_OTHER): Payer: Medicare Other | Admitting: Orthopaedic Surgery

## 2020-09-20 DIAGNOSIS — M1711 Unilateral primary osteoarthritis, right knee: Secondary | ICD-10-CM

## 2020-09-20 DIAGNOSIS — M1712 Unilateral primary osteoarthritis, left knee: Secondary | ICD-10-CM | POA: Diagnosis not present

## 2020-09-20 MED ORDER — METHYLPREDNISOLONE ACETATE 40 MG/ML IJ SUSP
40.0000 mg | INTRAMUSCULAR | Status: AC | PRN
  Administered 2020-09-20: 40 mg via INTRA_ARTICULAR

## 2020-09-20 MED ORDER — LIDOCAINE HCL 1 % IJ SOLN
0.5000 mL | INTRAMUSCULAR | Status: AC | PRN
  Administered 2020-09-20: .5 mL

## 2020-09-20 MED ORDER — GABAPENTIN 100 MG PO CAPS: 100.0000 mg | ORAL_CAPSULE | Freq: Three times a day (TID) | ORAL | 0 refills | Status: DC

## 2020-09-20 NOTE — Progress Notes (Signed)
   Procedure Note  Patient: [name] [name]             Date of Birth: 1935/09/29           MRN: [id number]             Visit Date: 09/20/2020 HPI: [name] [name] returns today requesting cortisone injections both knees.  She states that the cortisone injections gave her good relief until recently.  No new injury to either knee.  No fevers chills.  No recent vaccines.  Bilateral knees: Good range of motion both knees.  Patellofemoral crepitance with passive range of motion both knees.  No abnormal warmth erythema or effusion of either knee.   Procedures: Visit Diagnoses:  1. Unilateral primary osteoarthritis, right knee   2. Unilateral primary osteoarthritis, left knee     Large Joint Inj: bilateral knee on 09/20/2020 10:21 AM Indications: pain Details: 22 G 1.5 in needle, anterolateral approach  Arthrogram: No  Medications (Right): 0.5 mL lidocaine 1 %; 40 mg methylPREDNISolone acetate 40 MG/ML Medications (Left): 0.5 mL lidocaine 1 %; 40 mg methylPREDNISolone acetate 40 MG/ML Outcome: tolerated well, no immediate complications Procedure, treatment alternatives, risks and benefits explained, specific risks discussed. Consent was given by the patient. Immediately prior to procedure a time out was called to verify the correct patient, procedure, equipment, support staff and site/side marked as required. Patient was prepped and draped in the usual sterile fashion.    Plan: She understands to wait at least 3 months between injections.  She did ask for refill on her gabapentin 1 A day supply which we were happy to send in for her.  Questions were encouraged and answered.

## 2020-09-26 ENCOUNTER — Other Ambulatory Visit: Payer: Self-pay

## 2020-09-26 ENCOUNTER — Ambulatory Visit (INDEPENDENT_AMBULATORY_CARE_PROVIDER_SITE_OTHER): Payer: Medicare Other | Admitting: Primary Care

## 2020-09-26 ENCOUNTER — Encounter: Payer: Self-pay | Admitting: Primary Care

## 2020-09-26 VITALS — BP 108/72 | HR 77 | Temp 97.6°F | Ht <= 58 in | Wt 147.0 lb

## 2020-09-26 DIAGNOSIS — E785 Hyperlipidemia, unspecified: Secondary | ICD-10-CM

## 2020-09-26 DIAGNOSIS — E039 Hypothyroidism, unspecified: Secondary | ICD-10-CM | POA: Diagnosis not present

## 2020-09-26 DIAGNOSIS — M159 Polyosteoarthritis, unspecified: Secondary | ICD-10-CM

## 2020-09-26 DIAGNOSIS — A4902 Methicillin resistant Staphylococcus aureus infection, unspecified site: Secondary | ICD-10-CM

## 2020-09-26 DIAGNOSIS — G8929 Other chronic pain: Secondary | ICD-10-CM

## 2020-09-26 DIAGNOSIS — Z8051 Family history of malignant neoplasm of kidney: Secondary | ICD-10-CM

## 2020-09-26 DIAGNOSIS — N289 Disorder of kidney and ureter, unspecified: Secondary | ICD-10-CM

## 2020-09-26 DIAGNOSIS — Z1231 Encounter for screening mammogram for malignant neoplasm of breast: Secondary | ICD-10-CM

## 2020-09-26 DIAGNOSIS — K219 Gastro-esophageal reflux disease without esophagitis: Secondary | ICD-10-CM

## 2020-09-26 DIAGNOSIS — M170 Bilateral primary osteoarthritis of knee: Secondary | ICD-10-CM

## 2020-09-26 DIAGNOSIS — Z79890 Hormone replacement therapy (postmenopausal): Secondary | ICD-10-CM

## 2020-09-26 DIAGNOSIS — M8949 Other hypertrophic osteoarthropathy, multiple sites: Secondary | ICD-10-CM | POA: Diagnosis not present

## 2020-09-26 DIAGNOSIS — M25512 Pain in left shoulder: Secondary | ICD-10-CM

## 2020-09-26 DIAGNOSIS — M150 Primary generalized (osteo)arthritis: Secondary | ICD-10-CM

## 2020-09-26 DIAGNOSIS — Z0000 Encounter for general adult medical examination without abnormal findings: Secondary | ICD-10-CM | POA: Diagnosis not present

## 2020-09-26 NOTE — Assessment & Plan Note (Signed)
Following with orthopedics, recently received injections per Dr. Ninfa Linden.

## 2020-09-26 NOTE — Assessment & Plan Note (Signed)
No longer on HRT, doing well. Continue to monitor.

## 2020-09-26 NOTE — Assessment & Plan Note (Signed)
Recent lipid panel stable. Continue fenofibrate 134 mg and Fish Oil.

## 2020-09-26 NOTE — Assessment & Plan Note (Signed)
Following with orthopedics, continue gabapentin 300 mg HS.

## 2020-09-26 NOTE — Assessment & Plan Note (Signed)
Shingrix due, she declines. Mammogram due in August 2022, she prefers to continue screening. Bone density due and pending.   Encouraged regular activity and healthy diet.   Exam today stable. Labs reviewed.

## 2020-09-26 NOTE — Assessment & Plan Note (Signed)
Increased since Covid-19 infection, continue Pepcid 20-40 mg daily.

## 2020-09-26 NOTE — Patient Instructions (Signed)
[facility] to [facility] your [facility].   You will be contacted regarding your kidney ultrasound.  Please let us know if you have not been contacted within two weeks.   It was a pleasure to see you today!  Preventive Care 12 Years and Older, Female Preventive care refers to lifestyle choices and visits with your health care provider that can promote health and wellness. This includes: A yearly physical exam. This is also called an annual wellness visit. Regular dental and eye exams. Immunizations. Screening for certain conditions. Healthy lifestyle choices, such as: Eating a healthy diet. Getting regular exercise. Not using drugs or products that contain nicotine and tobacco. Limiting alcohol use. What can I expect for my preventive care visit? Physical exam Your health care provider will check your: Height and weight. These may be used to calculate your BMI (body mass index). BMI is a measurement that tells if you are at a healthy weight. Heart rate and blood pressure. Body temperature. Skin for abnormal spots. Counseling Your health care provider may ask you questions about your: Past medical problems. Family's medical history. Alcohol, tobacco, and drug use. Emotional well-being. Home life and relationship well-being. Sexual activity. Diet, exercise, and sleep habits. History of falls. Memory and ability to understand (cognition). Work and work Statistician. Pregnancy and menstrual history. Access to firearms. What immunizations do I need?  Vaccines are usually given at various ages, according to a schedule. Your health care provider will recommend vaccines for you based on your age, medicalhistory, and lifestyle or other factors, such as travel or where you work. What tests do I need? Blood tests Lipid and cholesterol levels. These may be checked every 5 years, or more often depending on your overall health. Hepatitis C test. Hepatitis B  test. Screening Lung cancer screening. You may have this screening every year starting at age 89 if you have a 30-pack-year history of smoking and currently smoke or have quit within the past 15 years. Colorectal cancer screening. All adults should have this screening starting at age 60 and continuing until age 68. Your health care provider may recommend screening at age 41 if you are at increased risk. You will have tests every 1-10 years, depending on your results and the type of screening test. Diabetes screening. This is done by checking your blood sugar (glucose) after you have not eaten for a while (fasting). You may have this done every 1-3 years. Mammogram. This may be done every 1-2 years. Talk with your health care provider about how often you should have regular mammograms. Abdominal aortic aneurysm (AAA) screening. You may need this if you are a current or former smoker. BRCA-related cancer screening. This may be done if you have a family history of breast, ovarian, tubal, or peritoneal cancers. Other tests STD (sexually transmitted disease) testing, if you are at risk. Bone density scan. This is done to screen for osteoporosis. You may have this done starting at age 95. Talk with your health care provider about your test results, treatment options,and if necessary, the need for more tests. Follow these instructions at home: Eating and drinking  Eat a diet that includes fresh fruits and vegetables, whole grains, lean protein, and low-fat dairy products. Limit your intake of foods with high amounts of sugar, saturated fats, and salt. Take vitamin and mineral supplements as recommended by your health care provider. Do not drink alcohol if your health care provider tells you not to drink. If you drink alcohol: Limit how  much you have to 0-1 drink a day. Be aware of how much alcohol is in your drink. In the [place] one drink equals one 12 oz bottle of beer (355 mL), one 5 oz glass of  wine (148 mL), or one 1 oz glass of hard liquor (44 mL).  Lifestyle Take daily care of your teeth and gums. Brush your teeth every morning and night with fluoride toothpaste. Floss one time each day. Stay active. Exercise for at least 30 minutes 5 or more days each week. Do not use any products that contain nicotine or tobacco, such as cigarettes, e-cigarettes, and chewing tobacco. If you need help quitting, ask your health care provider. Do not use drugs. If you are sexually active, practice safe sex. Use a condom or other form of protection in order to prevent STIs (sexually transmitted infections). Talk with your health care provider about taking a low-dose aspirin or statin. Find healthy ways to cope with stress, such as: Meditation, yoga, or listening to music. Journaling. Talking to a trusted person. Spending time with friends and family. Safety Always wear your seat belt while driving or riding in a vehicle. Do not drive: If you have been drinking alcohol. Do not ride with someone who has been drinking. When you are tired or distracted. While texting. Wear a helmet and other protective equipment during sports activities. If you have firearms in your house, make sure you follow all gun safety procedures. What's next? Visit your health care provider once a year for an annual wellness visit. Ask your health care provider how often you should have your eyes and teeth checked. Stay up to date on all vaccines. This information is not intended to replace advice given to you by your health care provider. Make sure you discuss any questions you have with your healthcare provider. Document Revised: 03/22/2020 Document Reviewed: 03/26/2018 Elsevier Patient Education  2022 [employer].

## 2020-09-26 NOTE — Assessment & Plan Note (Signed)
Chronic, ongoing, following with orthopedics.  Continue gabapentin 100 mg for which she takes 300 mg HS.

## 2020-09-26 NOTE — Assessment & Plan Note (Signed)
Doing well on Bactrim DS BID, continue same.

## 2020-09-26 NOTE — Assessment & Plan Note (Signed)
She is taking levothyroxine correctly, recent TSH stable. Continue levothyroxine 50 mcg.

## 2020-09-26 NOTE — Progress Notes (Signed)
Subjective:    Patient ID: [name] [name], female    DOB: [birth year], 85 y.o.   MRN: [id number]  HPI  [name] [name] is a very pleasant 85 y.o. female who presents today for complete physical.  Immunizations: -Tetanus: 2019 -Influenza: Completed this season  -Covid-19: Completed 4 vaccines -Shingles: Never completed  -Pneumonia: Prevnar 13 in 2020, Pneumovax in 2019   Diet: [place].  Exercise: No regular exercise.  Eye exam: Completes annually  Dental exam: Completes semi-annually   Mammogram: Due in August 2022 Dexa: Ordered and pending Colonoscopy: N/A given age.  BP Readings from Last 3 Encounters:  09/26/20 108/72  03/01/20 118/74  02/21/20 (!) 155/86         Review of Systems  Constitutional:  Negative for unexpected weight change.  HENT:  Negative for rhinorrhea.   Eyes:  Negative for visual disturbance.  Respiratory:  Negative for cough and shortness of breath.   Cardiovascular:  Negative for chest pain.  Gastrointestinal:  Negative for constipation and diarrhea.  Genitourinary:  Negative for difficulty urinating.  Musculoskeletal:  Positive for arthralgias and back pain.  Skin:  Negative for rash.  Allergic/Immunologic: Negative for environmental allergies.  Neurological:  Negative for dizziness and headaches.  Psychiatric/Behavioral:  The patient is not nervous/anxious.         Past Medical History:  Diagnosis Date   Anxiety    prev on prozac   Chest pain    Felt to be noncardiac in the past   Chronic rectal fissure    Colon polyp    Dyslipidemia    Ejection fraction    EF 60%, echo, 2007, atrial septum bows left to right compatible with increased left atrial pressure   Fibromyalgia    GERD (gastroesophageal reflux disease)    Barrett's esophagus   Hiatal hernia    Lumbar spine pain    Complex lumbar spine surgery at [place], 5284, complicated, MRSA, much of the appliance is removed, patient on chronic antibiotics   Melanoma ([place])     Mitral regurgitation    Mild, 2007,  /  moderate or severe echo, September, 2012, eccentric jet wrapping the left atrium, consider TEE for further assessment   Mitral valve prolapse    Mild mitral regurgitation echo, 2007   Osteoarthritis    Scoliosis    Sinusitis    Multiple sinus operations in the past   Statin intolerance    As of 2009, no further attempts to use statins   Thyroid nodule     Social History   Socioeconomic History   Marital status: Married    Spouse name: Not on file   Number of children: Not on file   Years of education: Not on file   Highest education level: Not on file  Occupational History   Not on file  Tobacco Use   Smoking status: Never   Smokeless tobacco: Never  Vaping Use   Vaping Use: Never used  Substance and Sexual Activity   Alcohol use: No   Drug use: No   Sexual activity: Not on file  Other Topics Concern   Not on file  Social History Narrative   From [place]   Married (second [phone]   3 kids, 2 of whom are local   Retired, Camera operator   Social Determinants of Health   Financial Resource Strain: Low Risk    Difficulty of Paying Living Expenses: Not hard at all  Food Insecurity: No [place]  Worried About Charity fundraiser in the Last Year: Never true   [place] in the Last Year: Never true  Transportation Needs: No Transportation Needs   Lack of Transportation (Medical): No   Lack of Transportation (Non-Medical): No  Physical Activity: Insufficiently Active   Days of Exercise per Week: 3 days   Minutes of Exercise per Session: 40 min  Stress: No Stress Concern Present   Feeling of Stress : Not at all  Social Connections: Not on file  Intimate Partner Violence: Not At Risk   Fear of Current or Ex-Partner: No   Emotionally Abused: No   Physically Abused: No   Sexually Abused: No    Past Surgical History:  Procedure Laterality Date   ABDOMINAL HYSTERECTOMY     BACK SURGERY      CHOLECYSTECTOMY     FOOT SURGERY     HERNIA REPAIR     NASAL SINUS SURGERY      Family History  Problem Relation Age of Onset   GER disease Mother    Dementia Mother    Breast cancer Mother    Heart disease Father        MI at 74   Scoliosis Daughter    Colon cancer Neg Hx     Allergies  Allergen Reactions   Other Other (See Comments)    Other Reaction: when taking for an extended period of time causes mouth ulcers and esophagus irritation Other Reaction: when taking for an extended period of time causes mouth ulcers and esophagus irritation    Fenofibric Acid Other (See Comments)    GI upset   Nsaids     REACTION: No long term use   Statins Other (See Comments)    Intolerant, myalgias Intolerant, myalgias Intolerant, myalgias Intolerant, myalgias Intolerant, myalgias Intolerant, myalgias    Trilipix [Choline Fenofibrate]     GI upset   Wasp Venom Swelling    Current Outpatient Medications on File Prior to Visit  Medication Sig Dispense Refill   ALPHA LIPOIC ACID PO Take 1 tablet by mouth daily. Daily     Biotin 10 MG TABS Take 1 tablet by mouth daily. Daily     calcium carbonate (OS-CAL) 600 MG TABS Take 1,200 mg by mouth 2 (two) times daily with a meal.     cholecalciferol (VITAMIN D) 1000 UNITS tablet Take 1,000 Units by mouth daily.     clotrimazole-betamethasone (LOTRISONE) cream APPLY TOPICALLY 2 (TWO) TIMES DAILY AS NEEDED. 30 g 0   diclofenac sodium (VOLTAREN) 1 % GEL Apply 2-4 grams to affected area up to 4 times daily (Patient taking differently: Apply 2-4 g topically 4 (four) times daily as needed (pain).) 4 Tube 2   famotidine (PEPCID) 20 MG tablet Take 1 tablet (20 mg total) by mouth daily. for heartburn. (Patient taking differently: Take 20 mg by mouth daily as needed for heartburn or indigestion.) 90 tablet 3   fenofibrate micronized (LOFIBRA) 134 MG capsule TAKE 1 CAPSULE (134 MG TOTAL) BY MOUTH AT BEDTIME. FOR CHOLESTEROL. 90 capsule 1   fish  oil-omega-3 fatty acids 1000 MG capsule Take 2 g by mouth daily.     gabapentin (NEURONTIN) 100 MG capsule Take 1 capsule (100 mg total) by mouth 3 (three) times daily. 270 capsule 0   Ginger, Zingiber officinalis, (GINGER PO) Take 1 tablet by mouth daily.     levothyroxine (SYNTHROID) 50 MCG tablet TAKE 1 TABLET BY MOUTH EVERY MORNING WITH WATER ON AN EMPTY  STOMACH. NO OTHER FOODS/MEDS FOR 30 MINS 90 tablet 2   NON FORMULARY Take 1 capsule by mouth daily. Tumeric      Daily      sulfamethoxazole-trimethoprim (BACTRIM DS) 800-160 MG tablet Take 1 tablet by mouth 2 (two) times daily.     tretinoin (RETIN-A) 0.05 % cream Apply 1 application topically daily as needed (as directed per dermatologist).   3   metoprolol tartrate (LOPRESSOR) 25 MG tablet Take 1 tablet (25 mg total) by mouth as directed. Take 1 tablet 25 mg twice a day and extra tablet as needed for breakthrough palpitations. 270 tablet 3   [DISCONTINUED] estrogens, conjugated, (PREMARIN) 0.3 MG tablet Take 1 tablet (0.3 mg total) by mouth 2 (two) times a week. 24 tablet 0   No current facility-administered medications on file prior to visit.    BP 108/72   Pulse 77   Temp 97.6 F (36.4 C) (Temporal)   Ht 4\' 9"  (1.448 m)   Wt 147 lb (66.7 kg)   SpO2 95%   BMI 31.81 kg/m  Objective:   Physical Exam HENT:     Right Ear: Tympanic membrane and ear canal normal.     Left Ear: Tympanic membrane and ear canal normal.     Nose: Nose normal.  Eyes:     Conjunctiva/sclera: Conjunctivae normal.     Pupils: Pupils are equal, round, and reactive to light.  Neck:     Thyroid: No thyromegaly.  Cardiovascular:     Rate and Rhythm: Normal rate and regular rhythm.     Heart sounds: No murmur heard. Pulmonary:     Effort: Pulmonary effort is normal.     Breath sounds: Normal breath sounds. No rales.  Abdominal:     General: Bowel sounds are normal.     Palpations: Abdomen is soft.     Tenderness: There is no abdominal tenderness.   Musculoskeletal:        General: Normal range of motion.     Cervical back: Neck supple.  Lymphadenopathy:     Cervical: No cervical adenopathy.  Skin:    General: Skin is warm and dry.     Findings: No rash.  Neurological:     Mental Status: She is alert and oriented to person, place, and time.     Cranial Nerves: No cranial nerve deficit.     Deep Tendon Reflexes: Reflexes are normal and symmetric.  Psychiatric:        Mood and Affect: Mood normal.          Assessment & Plan:      This visit occurred during the SARS-CoV-2 public health emergency.  Safety protocols were in place, including screening questions prior to the visit, additional usage of staff PPE, and extensive cleaning of exam room while observing appropriate contact time as indicated for disinfecting solutions.

## 2020-09-26 NOTE — Assessment & Plan Note (Signed)
Chronic, recent renal function stable compared to prior years. Discussed to avoid NSAID use, stay hydrated with water.   Offered renal ultrasound given FH of renal cancer, orders placed for ultrasound.

## 2020-11-03 ENCOUNTER — Other Ambulatory Visit: Payer: Self-pay

## 2020-11-03 ENCOUNTER — Ambulatory Visit
Admission: RE | Admit: 2020-11-03 | Discharge: 2020-11-03 | Disposition: A | Discharge status: Home / Self Care | Payer: Medicare Other | Source: Ambulatory Visit | Attending: Primary Care | Admitting: Primary Care

## 2020-11-03 DIAGNOSIS — N289 Disorder of kidney and ureter, unspecified: Secondary | ICD-10-CM | POA: Diagnosis not present

## 2020-11-03 DIAGNOSIS — N200 Calculus of kidney: Secondary | ICD-10-CM | POA: Insufficient documentation

## 2020-11-03 DIAGNOSIS — Z8051 Family history of malignant neoplasm of kidney: Secondary | ICD-10-CM | POA: Diagnosis present

## 2020-11-25 ENCOUNTER — Other Ambulatory Visit: Payer: Self-pay | Admitting: Primary Care

## 2020-11-25 DIAGNOSIS — E039 Hypothyroidism, unspecified: Secondary | ICD-10-CM

## 2020-11-27 NOTE — Telephone Encounter (Signed)
Last evaluation 09/26/2020 and TSH 09/19/2020. Refill sent in for 90 day and 1 rf

## 2020-12-13 NOTE — Progress Notes (Signed)
Office Visit Note  Patient: [name] [name]             Date of Birth: 1935/09/29           MRN: [id number]             PCP: [name], NP Referring: [name], NP Visit Date: 12/27/2020 Occupation: '@GUAROCC'$ @  Subjective:  Pain in multiple joints.   History of Present Illness: [name] [name] is a 85 y.o. female with a history of osteoarthritis and degenerative disc disease.  She states last November she went to physical therapy at [facility] urology.  While coming out of the elevator she tripped on on even floor.  She states after the fall she developed fracture of her nose and her right hand you have a fracture in your right hand to your fracture of the right hand entheses fifth metacarpal).  She also had lip injury.  She states she elected not to have nasal fracture repair.  She still have the scar tissues on her lips.  She is gradually recovering from it.  She continues to have pain and discomfort in her hands and her knee joints.  She also has discomfort in her neck and lower back.  Activities of Daily Living:  Patient reports morning stiffness for 5 minutes.   Patient Reports nocturnal pain.  Difficulty dressing/grooming: Denies Difficulty climbing stairs: Reports Difficulty getting out of chair: Denies Difficulty using hands for taps, buttons, cutlery, and/or writing: Denies  Review of Systems  Constitutional:  Positive for fatigue.  HENT:  Positive for mouth sores. Negative for mouth dryness and nose dryness.   Eyes:  Negative for pain, itching and dryness.  Respiratory:  Negative for shortness of breath and difficulty breathing.   Cardiovascular:  Negative for chest pain and palpitations.  Gastrointestinal:  Negative for blood in stool, constipation and diarrhea.  Endocrine: Negative for increased urination.  Genitourinary:  Negative for difficulty urinating.  Musculoskeletal:  Positive for joint pain, joint pain, joint swelling and morning stiffness. Negative  for myalgias, muscle tenderness and myalgias.  Skin:  Negative for color change, rash and redness.  Allergic/Immunologic: Negative for susceptible to infections.  Neurological:  Positive for headaches. Negative for dizziness, numbness, memory loss and weakness.  Hematological:  Negative for bruising/bleeding tendency.  Psychiatric/Behavioral:  Negative for confusion.    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[place]) 02/10/2019   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   Chronic left shoulder pain 01/21/2018   Chronic pain of left knee 01/21/2018   Chronic pain of right knee 01/21/2018   Osteoarthritis of glenohumeral joint, left 01/21/2018   Decreased renal function 01/29/2017   Anemia 01/29/2017   Preventative health care 01/29/2017   Other fatigue 09/03/2016   Primary insomnia 09/03/2016   Primary osteoarthritis of both knees 09/03/2016   DJD (degenerative joint disease), cervical 09/03/2016   MRSA infection 07/31/2016   Chronic rhinitis 07/24/2016   Sinusitis chronic, sphenoidal 07/24/2016   Osteopenia 03/04/2016   Palpitations 09/19/2014   Thyroid nodule, uninodular 09/18/2011   Hypothyroid 08/19/2011   Postmenopausal HRT (hormone replacement therapy) 08/13/2011   Endocarditis 04/24/2011   Mitral  regurgitation    Lumbar spine pain    Dyslipidemia    Chest pain    Statin intolerance    Mitral valve prolapse    Ejection fraction    Fibromyalgia    ADENOMATOUS COLONIC POLYP 08/15/2007   GERD 08/15/2007   BARRETTS ESOPHAGUS 08/15/2007   Diaphragmatic hernia 08/15/2007   Osteoarthritis 08/15/2007   CATARACT EXTRACTION,  HX OF 08/15/2007   Hiatal hernia 08/15/2007    Past Medical History:  Diagnosis Date   Anxiety    prev on prozac   Chest pain    Felt to be noncardiac in the past   Chronic rectal fissure    Colon polyp    Dyslipidemia    Ejection fraction    EF 60%, echo, 2007, atrial septum bows left to right compatible with increased left atrial pressure   Fibromyalgia    GERD (gastroesophageal reflux disease)    Barrett's esophagus   Hiatal hernia    Lumbar spine pain    Complex lumbar spine surgery at [place], 0000000, complicated, MRSA, much of the appliance is removed, patient on chronic antibiotics   Melanoma ([place])    Mitral regurgitation    Mild, 2007,  /  moderate or severe echo, September, 2012, eccentric jet wrapping the left atrium, consider TEE for further assessment   Mitral valve prolapse    Mild mitral regurgitation echo, 2007   Osteoarthritis    Scoliosis    Sinusitis    Multiple sinus operations in the past   Statin intolerance    As of 2009, no further attempts to use statins   Thyroid nodule     Family History  Problem Relation Age of Onset   GER disease Mother    Dementia Mother    Breast cancer Mother    Heart disease Father        MI at 70   Scoliosis Daughter    Colon cancer Neg Hx    Past Surgical History:  Procedure Laterality Date   ABDOMINAL HYSTERECTOMY     BACK SURGERY     CHOLECYSTECTOMY     FOOT SURGERY     HERNIA REPAIR     NASAL SINUS SURGERY     Social History   Social History Narrative   From [place]   Married (second [phone]   3 kids, 2 of whom are local   Retired, Armed forces technical officer History  Administered Date(s) Administered   Fluad Quad(high Dose 65+) 03/01/2020   Influenza Split 02/03/2012   Influenza,inj,Quad PF,6+ Mos 02/20/2017, 02/03/2018, 02/10/2019   Influenza-Unspecified 02/02/2016, 01/13/2017, 02/02/2018   Moderna Sars-Covid-2 Vaccination 04/26/2019, 05/25/2019, 02/11/2020, 08/01/2020   Pneumococcal  Conjugate-13 02/10/2019   Pneumococcal Polysaccharide-23 02/03/2018   Td 02/03/2016   Tdap 10/22/2017     Objective: Vital Signs: BP 100/62 (BP Location: Left Arm, Patient Position: Sitting, Cuff Size: Normal)   Pulse 69   Ht '4\' 9"'$  (1.448 m)   Wt 152 lb (68.9 kg)   BMI 32.89 kg/m    Physical Exam Vitals and nursing note reviewed.  Constitutional:      Appearance: She is well-developed.  HENT:     Head: Normocephalic and atraumatic.  Eyes:     Conjunctiva/sclera: Conjunctivae normal.  Cardiovascular:     Rate and Rhythm: Normal rate and regular rhythm.     Heart sounds: Normal heart sounds.  Pulmonary:     Effort: Pulmonary effort is normal.     Breath sounds: Normal breath sounds.  Abdominal:     General: Bowel sounds are normal.     Palpations: Abdomen is soft.  Musculoskeletal:     Cervical back: Normal range of motion.  Lymphadenopathy:     Cervical: No cervical adenopathy.  Skin:    General: Skin is warm and dry.     Capillary Refill: Capillary refill takes less than 2 seconds.  Neurological:     Mental Status: She is alert and oriented to person, place, and time.  Psychiatric:        Behavior: Behavior normal.     Musculoskeletal Exam: C-spine was in limited range of motion.  She had limited and painful range of motion of her lumbar spine.  She has severe thoracal lumbar kyphosis and scoliosis.  Shoulder joint abduction was limited to 90 degrees.  Elbow joints and wrist joints with good range of motion.  She has bilateral PIP and DIP thickening and subluxation.  Hip joints were in good range of motion.  She had good range of motion of bilateral knee joints without any warmth swelling or effusion.  CDAI Exam: CDAI Score: -- Patient Global: --; Provider Global: -- Swollen: --; Tender: -- Joint Exam 12/27/2020   No joint exam has been documented for this visit   There is currently no information documented on the homunculus. Go to the Rheumatology activity and  complete the homunculus joint exam.  Investigation: No additional findings.  Imaging: No results found.  Recent Labs: Lab Results  Component Value Date   WBC 3.6 (L) 09/19/2020   HGB 11.5 (L) 09/19/2020   PLT 190.0 09/19/2020   NA 140 09/19/2020   K 4.3 09/19/2020   CL 103 09/19/2020   CO2 27 09/19/2020   GLUCOSE 102 (H) 09/19/2020   BUN 23 09/19/2020   CREATININE 1.03 09/19/2020   BILITOT 0.5 09/19/2020   ALKPHOS 50 09/19/2020   AST 18 09/19/2020   ALT 12 09/19/2020   PROT 6.4 09/19/2020   ALBUMIN 4.5 09/19/2020   CALCIUM 9.4 09/19/2020    Speciality Comments: Repeat DEXA due June 2021-ACY 11/18/2018  Procedures:  No procedures performed Allergies: Other, Fenofibric acid, Nsaids, Statins, Trilipix [choline fenofibrate], and Wasp venom   Assessment / Plan:     Visit Diagnoses: Primary osteoarthritis of both hands-she has severe osteoarthritis with bilateral PIP and DIP thickening and subluxation.  She had fairly good grip strength.  Joint protection and muscle strengthening was discussed.  Primary osteoarthritis of bilateral shoulders-she has limited range of motion of bilateral shoulder joint with abduction up to 90 degrees.  Primary osteoarthritis of both knees - moderate osteoarthritis and moderate chondromalacia patella.  She continues to have pain and discomfort in her knee joints.  No warmth swelling or effusion was noted.  She has been going for physical therapy and has been very pleased with the response.  DDD (degenerative disc disease), cervical-she had limited range of motion of her cervical spine.  DDD (degenerative disc disease), lumbar - S/p fusion T11-S1.  She had thoracolumbar scoliosis and chronic lower back discomfort.  Fibromyalgia-she continues to have some generalized pain and discomfort.  Other fatigue-related to fibromyalgia and insomnia.  Primary insomnia-good sleep hygiene was discussed.  History of recent fall-she tripped and fell after  physical therapy at [facility] urology in November 2022.  She states she had nasal fracture and had facial injuries which are gradually healing.  She did not require surgery.  She also had a fracture of her right fifth metacarpal per patient.  Regular exercise  and fall prevention was discussed.  Osteopenia of multiple sites - T score -2.3 09/2015. T-score: -2.2 09/2017.  Patient did not get her repeat DEXA scan.  I gave her the number to call and schedule repeat DEXA scan.  The orders were placed at the last visit.  Other medical problems are listed as follows:  History of gastroesophageal reflux (GERD)  History of Barrett's esophagus  History of anemia  History of hypothyroidism  History of mitral valve prolapse  History of hiatal hernia  History of hyperlipidemia  Orders: No orders of the defined types were placed in this encounter.  No orders of the defined types were placed in this encounter.    Follow-Up Instructions: Return in about 6 months (around 06/26/2021) for Osteoarthritis.   Bo Merino, MD  Note - This record has been created using Editor, commissioning.  Chart creation errors have been sought, but may not always  have been located. Such creation errors do not reflect on  the standard of medical care.

## 2020-12-20 ENCOUNTER — Ambulatory Visit (INDEPENDENT_AMBULATORY_CARE_PROVIDER_SITE_OTHER): Payer: Medicare Other | Admitting: Orthopaedic Surgery

## 2020-12-20 ENCOUNTER — Encounter: Payer: Self-pay | Admitting: Orthopaedic Surgery

## 2020-12-20 ENCOUNTER — Other Ambulatory Visit: Payer: Self-pay

## 2020-12-20 DIAGNOSIS — M25562 Pain in left knee: Secondary | ICD-10-CM

## 2020-12-20 DIAGNOSIS — G8929 Other chronic pain: Secondary | ICD-10-CM | POA: Diagnosis not present

## 2020-12-20 DIAGNOSIS — M25561 Pain in right knee: Secondary | ICD-10-CM

## 2020-12-20 MED ORDER — LIDOCAINE HCL 1 % IJ SOLN
3.0000 mL | INTRAMUSCULAR | Status: AC | PRN
  Administered 2020-12-20: 3 mL

## 2020-12-20 MED ORDER — METHYLPREDNISOLONE ACETATE 40 MG/ML IJ SUSP
40.0000 mg | INTRAMUSCULAR | Status: AC | PRN
  Administered 2020-12-20: 40 mg via INTRA_ARTICULAR

## 2020-12-20 MED ORDER — GABAPENTIN 100 MG PO CAPS: 100.0000 mg | ORAL_CAPSULE | Freq: Three times a day (TID) | ORAL | 0 refills | Status: DC

## 2020-12-20 NOTE — Progress Notes (Signed)
Office Visit Note   Patient: [name] [name]           Date of Birth: 05-13-1935           MRN: [id number] Visit Date: 12/20/2020              Requested by: [name], NP [place],  [place] [id number] PCP: [name], NP   Assessment & Plan: Visit Diagnoses:  1. Chronic pain of right knee   2. Chronic pain of left knee     Plan: Per the patient's request I did provide a steroid injection in both knees which she tolerated well.  We can do this again in 3 months.  Follow-Up Instructions: Return in about 3 months (around 03/21/2021).   Orders:  Orders Placed This Encounter  Procedures   Large Joint Inj   Large Joint Inj   No orders of the defined types were placed in this encounter.     Procedures: Large Joint Inj: R knee on 12/20/2020 10:20 AM Indications: diagnostic evaluation and pain Details: 22 G 1.5 in needle, superolateral approach  Arthrogram: No  Medications: 3 mL lidocaine 1 %; 40 mg methylPREDNISolone acetate 40 MG/ML Outcome: tolerated well, no immediate complications Procedure, treatment alternatives, risks and benefits explained, specific risks discussed. Consent was given by the patient. Immediately prior to procedure a time out was called to verify the correct patient, procedure, equipment, support staff and site/side marked as required. Patient was prepped and draped in the usual sterile fashion.    Large Joint Inj: L knee on 12/20/2020 10:20 AM Indications: diagnostic evaluation and pain Details: 22 G 1.5 in needle, superolateral approach  Arthrogram: No  Medications: 3 mL lidocaine 1 %; 40 mg methylPREDNISolone acetate 40 MG/ML Outcome: tolerated well, no immediate complications Procedure, treatment alternatives, risks and benefits explained, specific risks discussed. Consent was given by the patient. Immediately prior to procedure a time out was called to verify the correct patient, procedure, equipment, support staff and  site/side marked as required. Patient was prepped and draped in the usual sterile fashion.      Clinical Data: No additional findings.   Subjective: Chief Complaint  Patient presents with   Left Knee - Follow-up   Right Knee - Follow-up  The patient comes in today for steroid injections that she gets in both knees about every 3 months.  She is not a surgical candidate in light of the fact that she has had significant MRSA before as a relates to lumbar spine and abdominal surgeries.  She says the injections lasted about 6 to 8 weeks.  She is 85 years old.  She has had no other acute change in her medical status.  She does wish to have injections in both knees today again with steroid.  HPI  Review of Systems There is currently listed no headache, chest pain, shortness of breath, fever, chills, nausea, vomiting  Objective: Vital Signs: There were no vitals taken for this visit.  Physical Exam She is alert and orient x3 and in no acute distress Ortho Exam Examination of both knees shows slight varus malalignment with no significant effusion and painful range of motion.  Both knees are ligamentously stable. Specialty Comments:  No specialty comments available.  Imaging: No results found.   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[place]) 02/10/2019   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   Chronic left shoulder pain 01/21/2018   Chronic pain of left knee 01/21/2018   Chronic pain of right knee 01/21/2018   Osteoarthritis of glenohumeral joint, left 01/21/2018   Decreased renal function 01/29/2017   Anemia 01/29/2017    Preventative health care 01/29/2017   Other fatigue 09/03/2016   Primary insomnia 09/03/2016   Primary osteoarthritis of both knees 09/03/2016   DJD (degenerative joint disease), cervical 09/03/2016   MRSA infection 07/31/2016   Chronic rhinitis 07/24/2016   Sinusitis chronic, sphenoidal 07/24/2016   Osteopenia 03/04/2016   Palpitations 09/19/2014   Thyroid nodule, uninodular 09/18/2011   Hypothyroid 08/19/2011   Postmenopausal HRT (hormone replacement therapy) 08/13/2011   Endocarditis 04/24/2011   Mitral regurgitation    Lumbar spine pain    Dyslipidemia    Chest pain    Statin intolerance    Mitral valve prolapse    Ejection fraction    Fibromyalgia    ADENOMATOUS COLONIC POLYP 08/15/2007   GERD 08/15/2007   BARRETTS ESOPHAGUS 08/15/2007   Diaphragmatic hernia 08/15/2007   Osteoarthritis 08/15/2007   CATARACT EXTRACTION, HX OF 08/15/2007   Hiatal hernia 08/15/2007   Past Medical History:  Diagnosis Date   Anxiety    prev on prozac   Chest pain    Felt to be noncardiac in the past   Chronic rectal fissure    Colon polyp    Dyslipidemia    Ejection fraction    EF 60%, echo, 2007, atrial septum bows left to right compatible with increased left atrial pressure   Fibromyalgia    GERD (gastroesophageal reflux disease)    Barrett's esophagus   Hiatal hernia    Lumbar spine pain    Complex lumbar spine surgery at [facility], 0000000, complicated, MRSA, much of the appliance is removed, patient on chronic antibiotics   Melanoma ([place])    Mitral regurgitation    Mild, 2007,  /  moderate or severe echo, September, 2012, eccentric jet wrapping the left atrium, consider TEE for further assessment   Mitral valve prolapse    Mild mitral regurgitation echo, 2007   Osteoarthritis    Scoliosis    Sinusitis    Multiple sinus operations in the past   Statin intolerance    As of 2009, no further attempts to use statins   Thyroid nodule     Family History  Problem Relation Age of  Onset   GER disease Mother    Dementia Mother    Breast cancer Mother    Heart disease Father        MI at 46   Scoliosis Daughter    Colon cancer Neg Hx     Past Surgical History:  Procedure Laterality Date   ABDOMINAL HYSTERECTOMY     BACK SURGERY     CHOLECYSTECTOMY     FOOT SURGERY     HERNIA REPAIR     NASAL SINUS SURGERY     Social History   Occupational History   Not on file  Tobacco Use   Smoking status: Never   Smokeless tobacco: Never  Vaping Use   Vaping Use: Never used  Substance and Sexual Activity   Alcohol use: No   Drug use: No   Sexual activity: Not on  file

## 2020-12-27 ENCOUNTER — Other Ambulatory Visit: Payer: Self-pay

## 2020-12-27 ENCOUNTER — Ambulatory Visit (INDEPENDENT_AMBULATORY_CARE_PROVIDER_SITE_OTHER): Payer: Medicare Other | Admitting: Rheumatology

## 2020-12-27 ENCOUNTER — Encounter: Payer: Self-pay | Admitting: Rheumatology

## 2020-12-27 VITALS — BP 100/62 | HR 69 | Ht <= 58 in | Wt 152.0 lb

## 2020-12-27 DIAGNOSIS — M5030 Other cervical disc degeneration, unspecified cervical region: Secondary | ICD-10-CM | POA: Diagnosis not present

## 2020-12-27 DIAGNOSIS — M170 Bilateral primary osteoarthritis of knee: Secondary | ICD-10-CM

## 2020-12-27 DIAGNOSIS — Z8639 Personal history of other endocrine, nutritional and metabolic disease: Secondary | ICD-10-CM

## 2020-12-27 DIAGNOSIS — M797 Fibromyalgia: Secondary | ICD-10-CM

## 2020-12-27 DIAGNOSIS — Z9181 History of falling: Secondary | ICD-10-CM

## 2020-12-27 DIAGNOSIS — Z862 Personal history of diseases of the blood and blood-forming organs and certain disorders involving the immune mechanism: Secondary | ICD-10-CM

## 2020-12-27 DIAGNOSIS — M19041 Primary osteoarthritis, right hand: Secondary | ICD-10-CM | POA: Diagnosis not present

## 2020-12-27 DIAGNOSIS — R5383 Other fatigue: Secondary | ICD-10-CM

## 2020-12-27 DIAGNOSIS — M8589 Other specified disorders of bone density and structure, multiple sites: Secondary | ICD-10-CM

## 2020-12-27 DIAGNOSIS — M19042 Primary osteoarthritis, left hand: Secondary | ICD-10-CM

## 2020-12-27 DIAGNOSIS — M5136 Other intervertebral disc degeneration, lumbar region: Secondary | ICD-10-CM

## 2020-12-27 DIAGNOSIS — M19012 Primary osteoarthritis, left shoulder: Secondary | ICD-10-CM | POA: Diagnosis not present

## 2020-12-27 DIAGNOSIS — Z8719 Personal history of other diseases of the digestive system: Secondary | ICD-10-CM

## 2020-12-27 DIAGNOSIS — F5101 Primary insomnia: Secondary | ICD-10-CM

## 2020-12-27 DIAGNOSIS — Z8679 Personal history of other diseases of the circulatory system: Secondary | ICD-10-CM

## 2020-12-27 NOTE — Patient Instructions (Signed)
Please call [facility] to schedule DEXA scan [phone]

## 2021-01-04 ENCOUNTER — Other Ambulatory Visit: Payer: Self-pay | Admitting: Primary Care

## 2021-01-04 DIAGNOSIS — K219 Gastro-esophageal reflux disease without esophagitis: Secondary | ICD-10-CM

## 2021-01-04 IMAGING — CR DG KNEE COMPLETE 4+V*R*
4 series · 4 of 4 positions shown · non-contrast
Comparison: None.

CLINICAL DATA: Fall

EXAM:
RIGHT KNEE - COMPLETE 4+ VIEW

[x knee lat right]
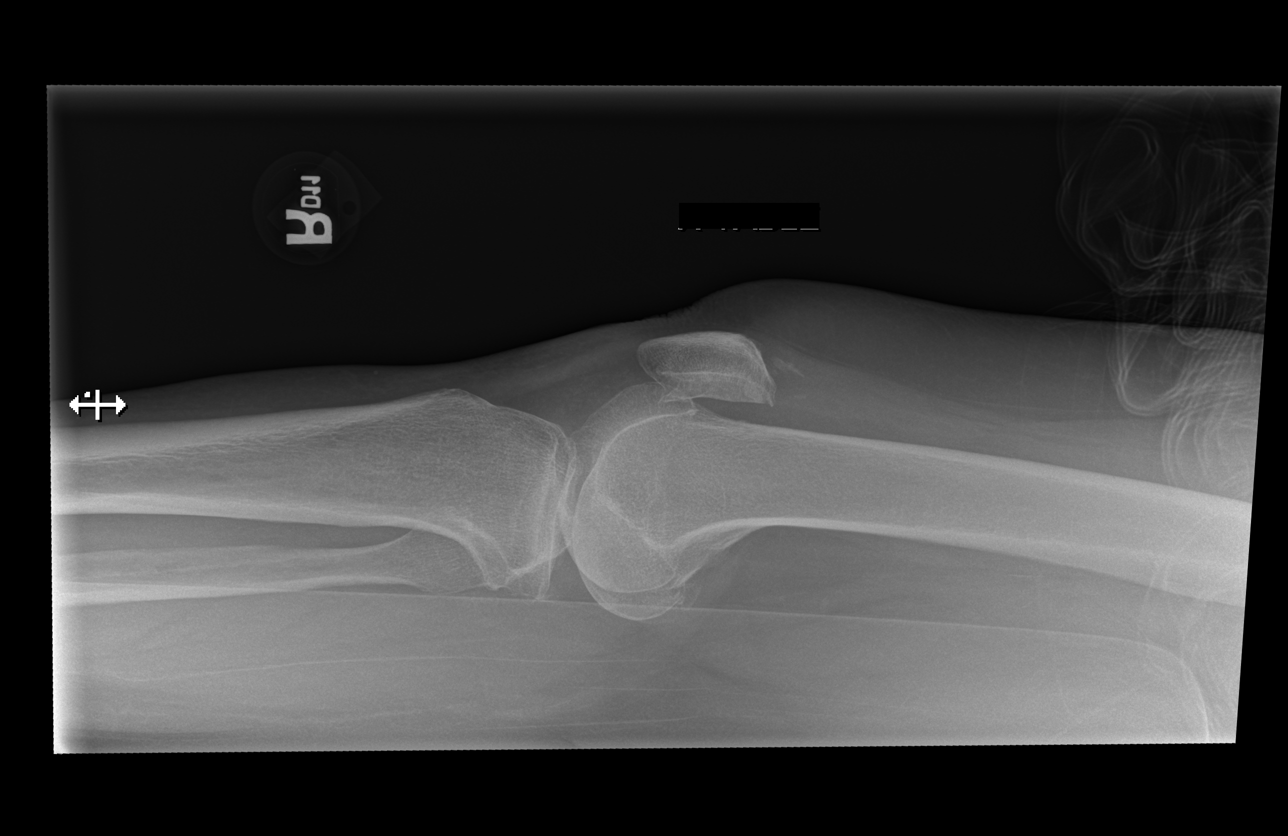

[x knee ap right (1 of 3)]
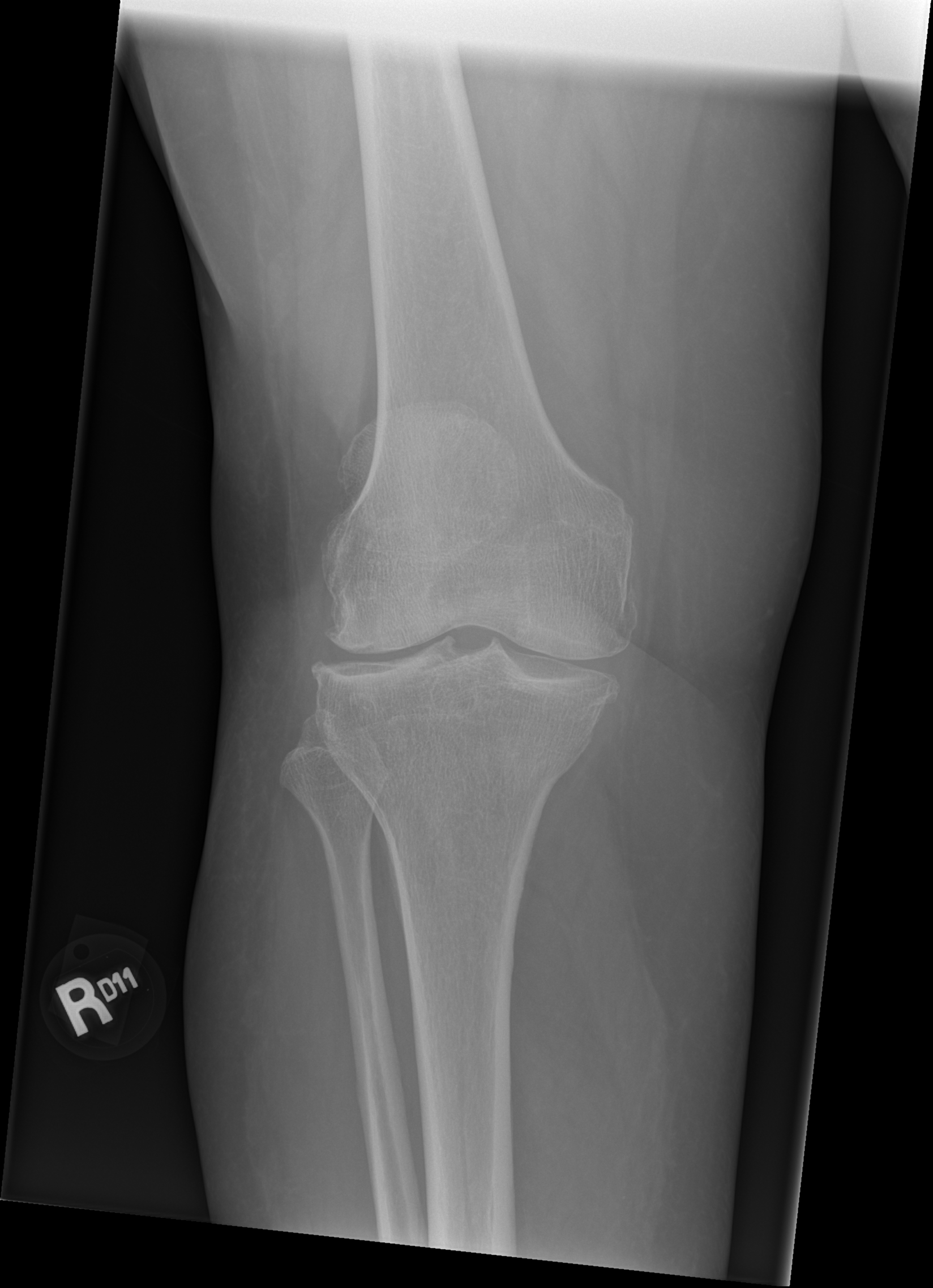

[x knee ap right (2 of 3)]
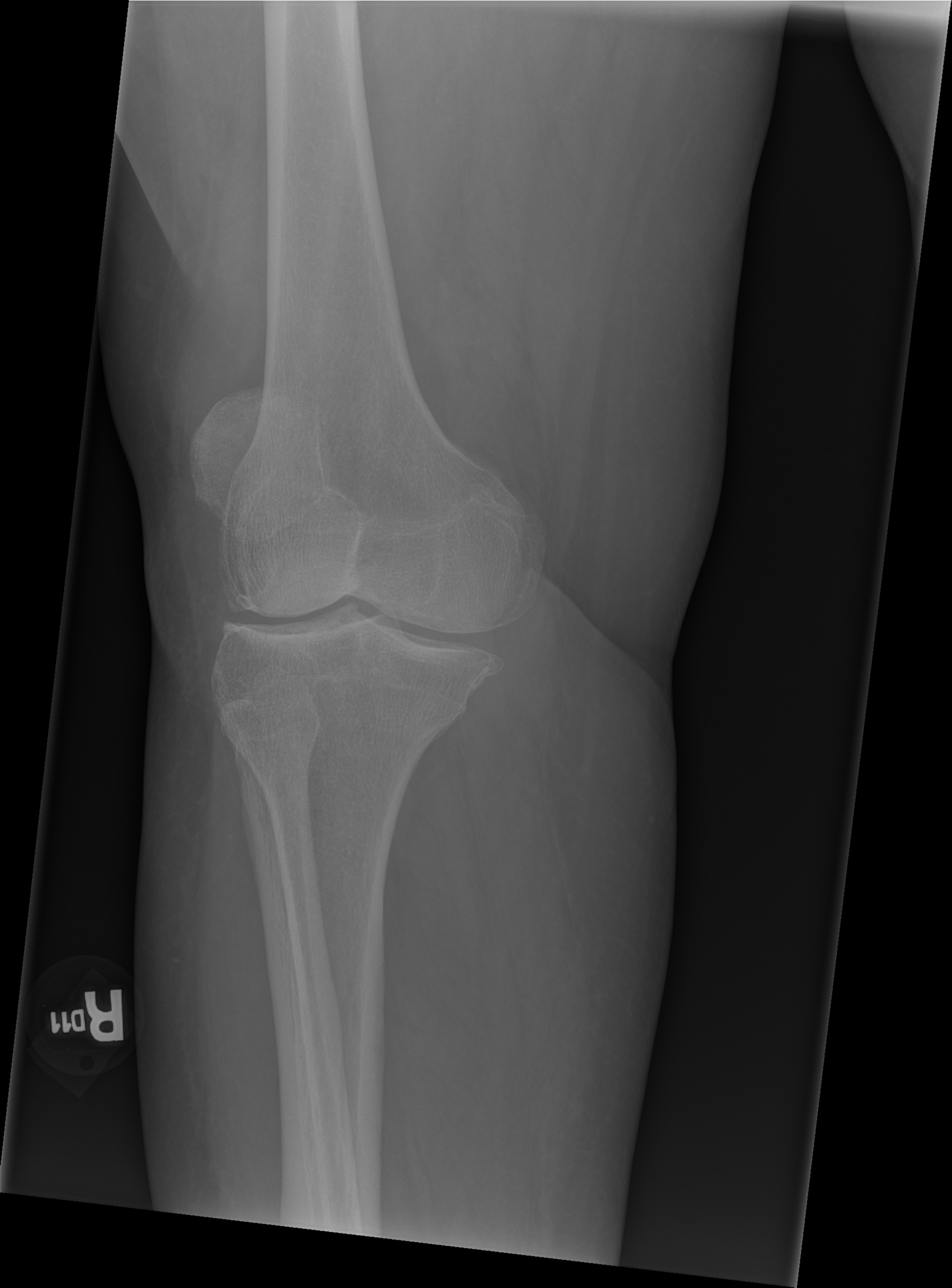

[x knee ap right (3 of 3)]
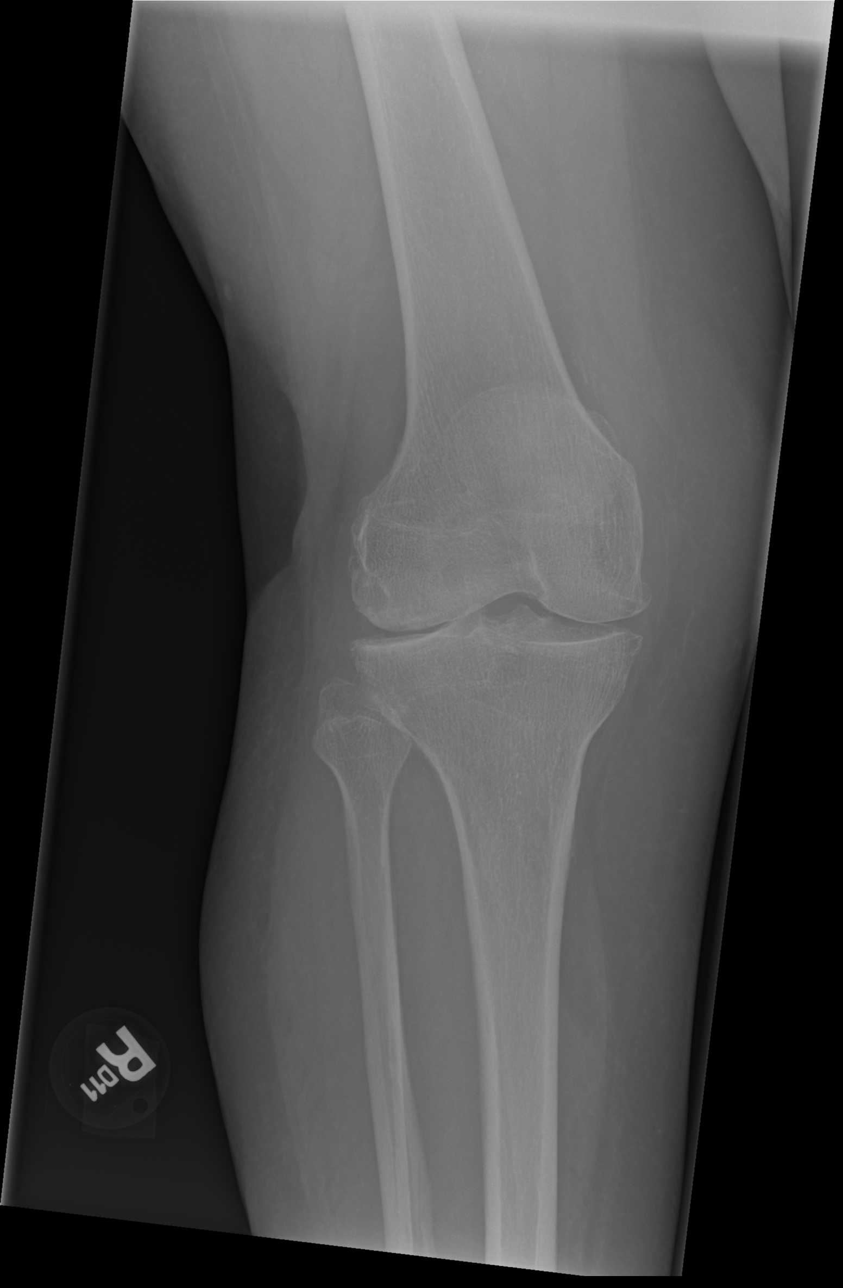

[4 of 4 positions shown; findings below may reference images not displayed]

FINDINGS: There is possible cortical step-off seen at the posterior lateral
femoral condyle just seen on the lateral views. A trace knee joint
effusion is noted. Prepatellar subcutaneous edema is seen.
IMPRESSION: Possible cortical step-off seen at the posterior lateral femoral
condyle. If pain persists would recommend cross-sectional imaging
for further evaluation.

## 2021-01-04 IMAGING — CR DG KNEE COMPLETE 4+V*L*
4 series · 4 of 4 positions shown · non-contrast
Comparison: None.

CLINICAL DATA: Fall pain

EXAM:
LEFT KNEE - COMPLETE 4+ VIEW

[x knee ap left (1 of 3)]
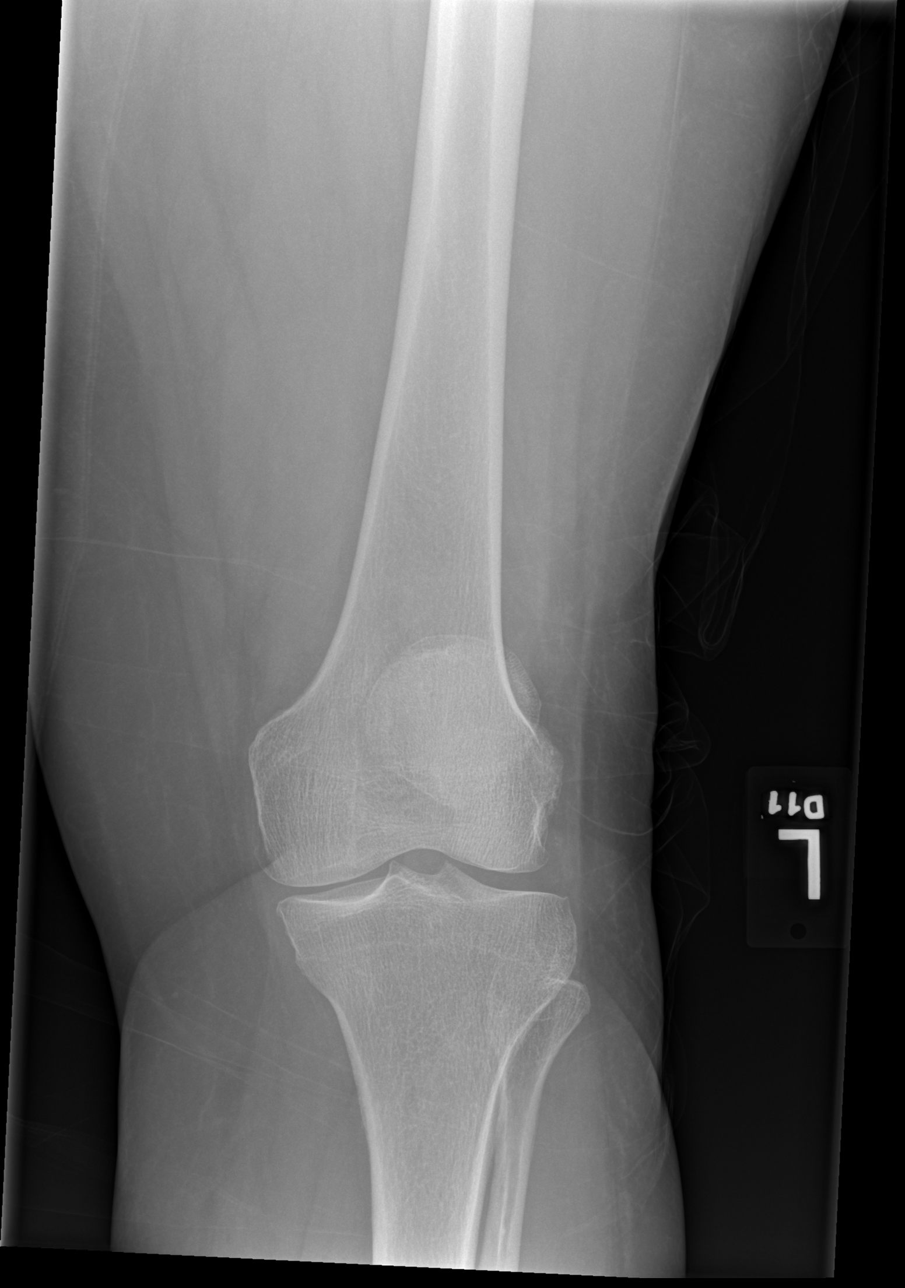

[x knee ap left (2 of 3)]
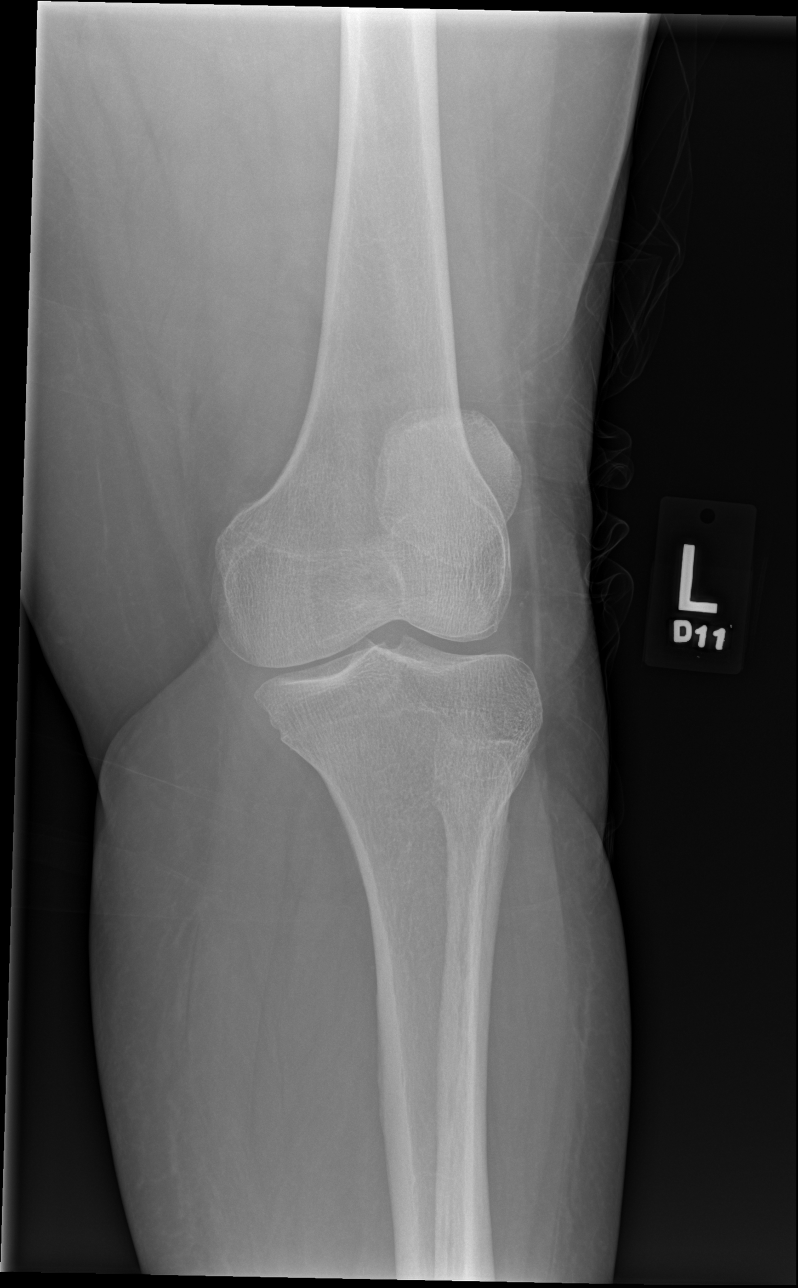

[x knee ap left (3 of 3)]
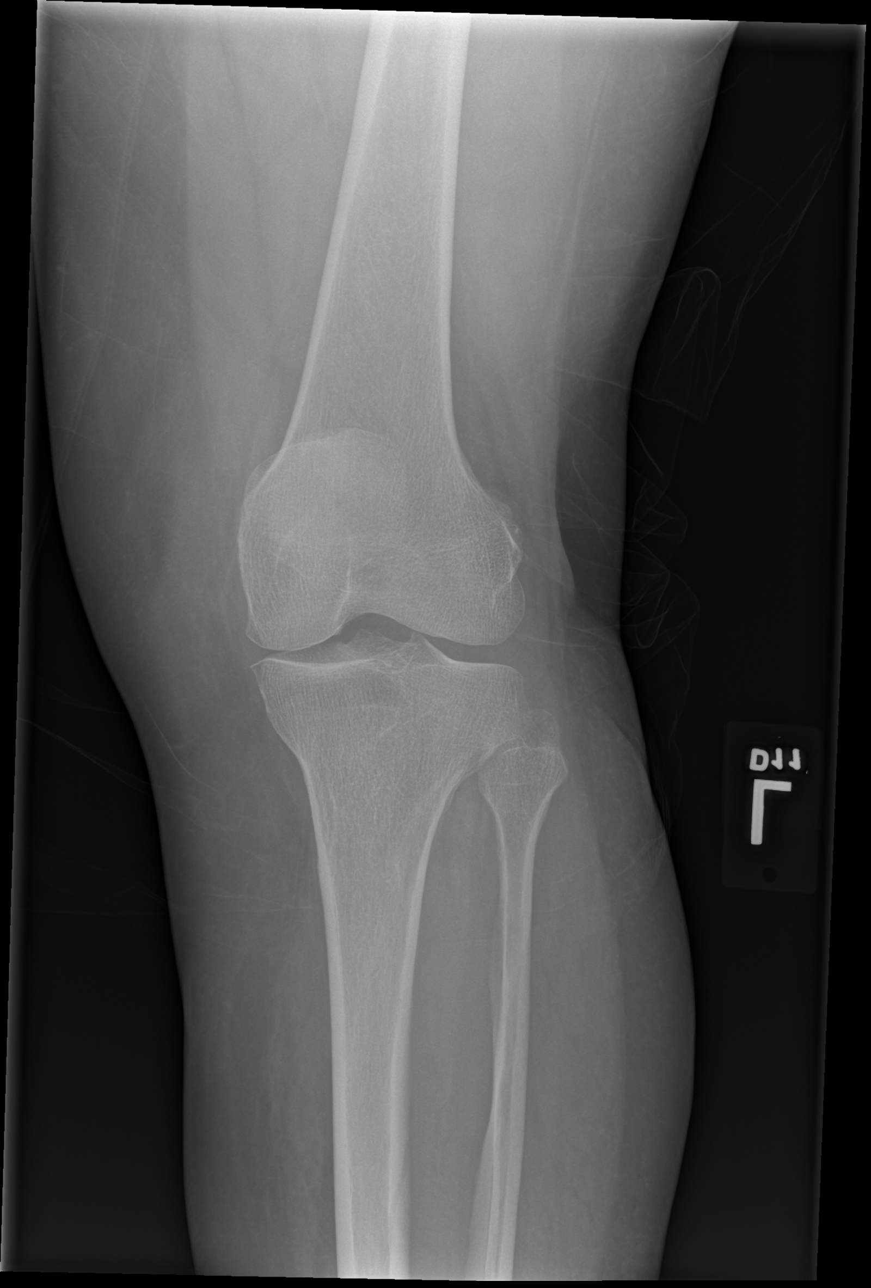

[x knee lat left]
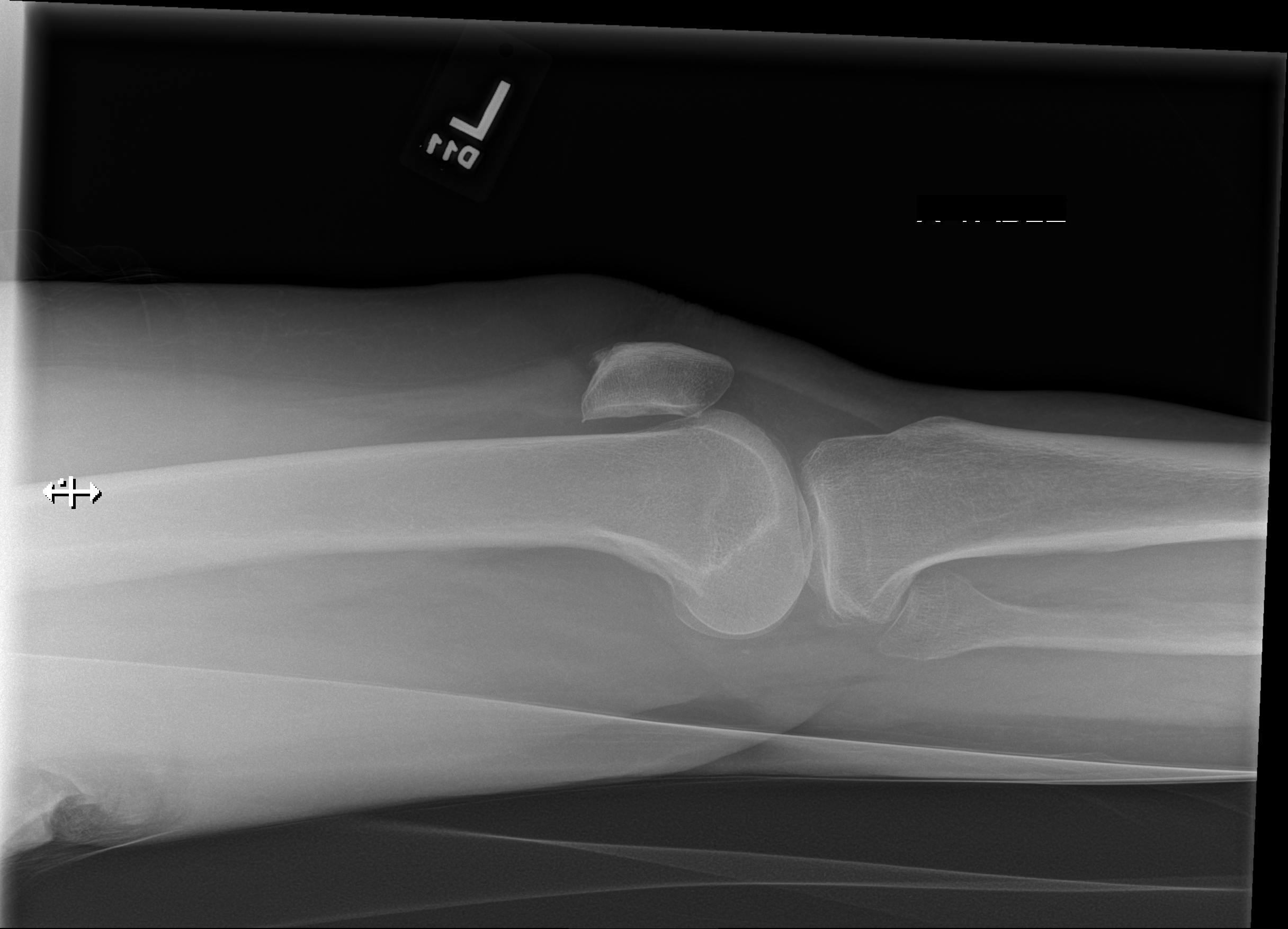

[4 of 4 positions shown; findings below may reference images not displayed]

FINDINGS: No evidence of fracture, dislocation, or joint effusion. There is
mild medial compartment osteoarthritis with subchondral sclerosis.
No large knee joint effusion is seen.
IMPRESSION: No acute osseous abnormality.

## 2021-01-14 ENCOUNTER — Other Ambulatory Visit: Payer: Self-pay | Admitting: Family Medicine

## 2021-01-14 DIAGNOSIS — E785 Hyperlipidemia, unspecified: Secondary | ICD-10-CM

## 2021-01-15 DIAGNOSIS — M85852 Other specified disorders of bone density and structure, left thigh: Secondary | ICD-10-CM | POA: Diagnosis not present

## 2021-01-15 DIAGNOSIS — M85851 Other specified disorders of bone density and structure, right thigh: Secondary | ICD-10-CM | POA: Diagnosis not present

## 2021-01-15 DIAGNOSIS — Z780 Asymptomatic menopausal state: Secondary | ICD-10-CM | POA: Diagnosis not present

## 2021-01-16 ENCOUNTER — Telehealth: Payer: Self-pay

## 2021-01-16 ENCOUNTER — Ambulatory Visit: Payer: Medicare Other

## 2021-01-16 NOTE — Telephone Encounter (Signed)
Received DEXA results from [facility].  Date of Scan: 01/15/2021  Lowest T-score: -1.9  BMD: 0.633  Lowest site measured: right femoral neck  Current Regimen: calcium and vitamin D  Recommendation: calcium, vitamin D, and resistive exercise. Repeat DEXA in 2 years.  Reviewed by: Hazel Sams, PA-C  Next Appointment:  06/27/2021  Advised patient and she verbalized understanding.

## 2021-01-23 ENCOUNTER — Other Ambulatory Visit: Payer: Self-pay

## 2021-01-23 ENCOUNTER — Ambulatory Visit: Payer: Medicare Other | Attending: Internal Medicine

## 2021-01-23 DIAGNOSIS — Z23 Encounter for immunization: Secondary | ICD-10-CM

## 2021-01-23 MED ORDER — MODERNA COVID-19 BIVAL BOOSTER 50 MCG/0.5ML IM SUSP
INTRAMUSCULAR | 0 refills | Status: DC
  Filled 2021-01-23: qty 0.5, 1d supply, fill #0

## 2021-01-23 NOTE — Progress Notes (Signed)
   Covid-19 Vaccination Clinic  Name:  [name] [name]    MRN: [id number] DOB: 1935/08/27  01/23/2021  [name] was observed post Covid-19 immunization for 15 minutes without incident. She was provided with Vaccine Information Sheet and instruction to access the V-Safe system.   [name] was instructed to call 911 with any severe reactions post vaccine: Difficulty breathing  Swelling of face and throat  A fast heartbeat  A bad rash all over body  Dizziness and weakness   Lu Duffel, PharmD, MBA Clinical Acute Care Pharmacist

## 2021-01-26 ENCOUNTER — Ambulatory Visit: Payer: Medicare Other

## 2021-01-30 ENCOUNTER — Ambulatory Visit: Payer: Medicare Other

## 2021-02-06 ENCOUNTER — Other Ambulatory Visit: Payer: Self-pay | Admitting: Family Medicine

## 2021-02-07 NOTE — Telephone Encounter (Signed)
[name],  Is this patient seeing you any longer? You were the last to prescribe so just wanted to check. If not, I can take over refills.   Let me know, thanks! Allie Bossier, NP-C

## 2021-02-14 DIAGNOSIS — H43813 Vitreous degeneration, bilateral: Secondary | ICD-10-CM | POA: Diagnosis not present

## 2021-02-22 ENCOUNTER — Other Ambulatory Visit: Payer: Self-pay

## 2021-02-22 ENCOUNTER — Ambulatory Visit
Admission: RE | Admit: 2021-02-22 | Discharge: 2021-02-22 | Disposition: A | Discharge status: Home / Self Care | Payer: Medicare Other | Source: Ambulatory Visit | Attending: Primary Care | Admitting: Primary Care

## 2021-02-22 DIAGNOSIS — Z1231 Encounter for screening mammogram for malignant neoplasm of breast: Secondary | ICD-10-CM

## 2021-03-18 ENCOUNTER — Other Ambulatory Visit: Payer: Self-pay | Admitting: Primary Care

## 2021-03-18 ENCOUNTER — Other Ambulatory Visit: Payer: Self-pay | Admitting: Cardiovascular Disease

## 2021-03-18 DIAGNOSIS — E039 Hypothyroidism, unspecified: Secondary | ICD-10-CM

## 2021-03-19 NOTE — Telephone Encounter (Signed)
Please schedule office visit for refills or advise patient to get through PCP. Thank you!

## 2021-03-21 ENCOUNTER — Ambulatory Visit (INDEPENDENT_AMBULATORY_CARE_PROVIDER_SITE_OTHER): Payer: Medicare Other | Admitting: Orthopaedic Surgery

## 2021-03-21 DIAGNOSIS — M1712 Unilateral primary osteoarthritis, left knee: Secondary | ICD-10-CM | POA: Diagnosis not present

## 2021-03-21 DIAGNOSIS — M1711 Unilateral primary osteoarthritis, right knee: Secondary | ICD-10-CM

## 2021-03-21 MED ORDER — METHYLPREDNISOLONE ACETATE 40 MG/ML IJ SUSP
40.0000 mg | INTRAMUSCULAR | Status: AC | PRN
  Administered 2021-03-21: 40 mg via INTRA_ARTICULAR

## 2021-03-21 MED ORDER — LIDOCAINE HCL 1 % IJ SOLN
3.0000 mL | INTRAMUSCULAR | Status: AC | PRN
Start: 2021-03-21 — End: 2021-03-21
  Administered 2021-03-21: 3 mL

## 2021-03-21 MED ORDER — LIDOCAINE HCL 1 % IJ SOLN
3.0000 mL | INTRAMUSCULAR | Status: AC | PRN
  Administered 2021-03-21: 3 mL

## 2021-03-21 MED ORDER — GABAPENTIN 100 MG PO CAPS: 100.0000 mg | ORAL_CAPSULE | Freq: Three times a day (TID) | ORAL | 0 refills | Status: DC

## 2021-03-21 NOTE — Progress Notes (Signed)
   Procedure Note  Patient: [name] [name]             Date of Birth: 03-10-1936           MRN: [id number]             Visit Date: 03/21/2021  HPI: [name] [name] returns today for follow-up of bilateral knees.  She has known osteoarthritis both knees.  She is requesting injections in both knees.  Last cortisone injections were given on 722.  She states that these helped and lasted for about 6 to 8 weeks.  She has had no new injuries.  She denies any fevers chills or recent vaccines in the last 2 weeks.  She is nondiabetic.  Review of systems see HPI otherwise negative  Physical exam: Bilateral knees good range of motion no abnormal warmth erythema or effusion of either knee.  Procedures: Visit Diagnoses:  1. Unilateral primary osteoarthritis, left knee   2. Unilateral primary osteoarthritis, right knee     Large Joint Inj: bilateral knee on 03/21/2021 10:35 AM Indications: pain Details: 22 G 1.5 in needle, anterolateral approach  Arthrogram: No  Medications (Right): 3 mL lidocaine 1 %; 40 mg methylPREDNISolone acetate 40 MG/ML Medications (Left): 3 mL lidocaine 1 %; 40 mg methylPREDNISolone acetate 40 MG/ML Outcome: tolerated well, no immediate complications Procedure, treatment alternatives, risks and benefits explained, specific risks discussed. Consent was given by the patient. Immediately prior to procedure a time out was called to verify the correct patient, procedure, equipment, support staff and site/side marked as required. Patient was prepped and draped in the usual sterile fashion.    Plan: We will see her back in 3 months for repeat injections both knees.  Did refill her Neurontin today.  Questions were encouraged and answered

## 2021-03-23 ENCOUNTER — Encounter: Payer: Self-pay | Admitting: Nurse Practitioner

## 2021-03-23 ENCOUNTER — Ambulatory Visit (INDEPENDENT_AMBULATORY_CARE_PROVIDER_SITE_OTHER): Payer: Medicare Other | Admitting: Nurse Practitioner

## 2021-03-23 ENCOUNTER — Other Ambulatory Visit: Payer: Self-pay

## 2021-03-23 VITALS — BP 120/68 | HR 69 | Ht <= 58 in | Wt 153.0 lb

## 2021-03-23 DIAGNOSIS — I340 Nonrheumatic mitral (valve) insufficiency: Secondary | ICD-10-CM | POA: Diagnosis not present

## 2021-03-23 DIAGNOSIS — R002 Palpitations: Secondary | ICD-10-CM | POA: Diagnosis not present

## 2021-03-23 DIAGNOSIS — E782 Mixed hyperlipidemia: Secondary | ICD-10-CM | POA: Diagnosis not present

## 2021-03-23 DIAGNOSIS — R079 Chest pain, unspecified: Secondary | ICD-10-CM

## 2021-03-23 MED ORDER — METOPROLOL TARTRATE 25 MG PO TABS: ORAL_TABLET | ORAL | 2 refills | Status: DC

## 2021-03-23 NOTE — Progress Notes (Signed)
Office Visit    Patient Name: [name] [name] Date of Encounter: 03/23/2021  Primary Care Provider:  [name], NP Primary Cardiologist:  Ida Rogue, MD  Chief Complaint    85 y/o ? w/ a h/o mitral regurgitation, hyperlipidemia, statin intolerance, palpitations, GERD, hiatal hernia, fibromyalgia, and multiple back surgeries complicated by MRSA infection on chronic suppressive antibiotic therapy, who presents for follow-up related to mitral valve disease.  Past Medical History    Past Medical History:  Diagnosis Date   Anxiety    prev on prozac   Chest pain    Felt to be noncardiac in the past   Chronic rectal fissure    Colon polyp    Dyslipidemia    Fibromyalgia    GERD (gastroesophageal reflux disease)    Barrett's esophagus   Hiatal hernia    Lumbar spine pain    Complex lumbar spine surgery at [place], 5053, complicated, MRSA, much of the appliance is removed, patient on chronic antibiotics   Melanoma ([place])    Mitral regurgitation    a. 2007 Echo Mild MR; b. 10/2016 Echo: EF 55-60%, no rwma, GrI DD, Mild MR.   Osteoarthritis    Scoliosis    Sinusitis    Multiple sinus operations in the past   Statin intolerance    As of 2009, no further attempts to use statins   Thyroid nodule    Past Surgical History:  Procedure Laterality Date   ABDOMINAL HYSTERECTOMY     BACK SURGERY     CHOLECYSTECTOMY     FOOT SURGERY     HERNIA REPAIR     NASAL SINUS SURGERY      Allergies  Allergies  Allergen Reactions   Other Other (See Comments)    Other Reaction: when taking for an extended period of time causes mouth ulcers and esophagus irritation Other Reaction: when taking for an extended period of time causes mouth ulcers and esophagus irritation    Fenofibric Acid Other (See Comments)    GI upset   Nsaids     REACTION: No long term use   Statins Other (See Comments)    Intolerant, myalgias Intolerant, myalgias Intolerant, myalgias Intolerant,  myalgias Intolerant, myalgias Intolerant, myalgias    Trilipix [Choline Fenofibrate]     GI upset   Wasp Venom Swelling    History of Present Illness    85 year old female with the above past medical history including mitral regurgitation, hyperlipidemia, statin intolerance, palpitations, GERD, hiatal hernia, fibromyalgia, multiple back surgeries complicated by MRSA infection on chronic suppressive antibiotic therapy.  She has been followed over the years by cardiology in the setting of mild mitral regurgitation.  At 1 point, an echocardiogram in September 2012 suggested more severe mitral regurgitation however, after further review, this was felt to be only mild.  Most recent echocardiogram in July 2018 again showed mild mitral regurgitation.  [name] [name] was last seen in cardiology clinic in October 2021, at which time she was doing well from a cardiac standpoint.  Over the past year, she has continued to do well.  She is exercising 3 days a week using a recumbent bicycle and also performing some upper body exercises.  She does not typically experience dyspnea on exertion when exercising but does experience dyspnea exertion if she has to walk long distances or upstairs.  Overall, her dyspnea is stable.  She occasionally notes a brief and fleeting pain in her left upper chest occurring once or twice a week,  typically while sitting, and resolving spontaneously.  This is similar to what she is experienced over many years.  She does not experience the symptoms with activity at all.  She rarely has taken an additional metoprolol tablet for elevated heart rates, but notes that she did a few weeks ago after getting knee injections.  Heart rate was in the 80s at the time though she was not is experiencing palpitations, she was somewhat bothered by the slight increase in heart rate, which improved after taking additional metoprolol.  She denies PND, orthopnea, dizziness, syncope, or early satiety.  She sometimes  notes swelling around her ankles if she sitting for long periods of time.  Home Medications    Current Outpatient Medications  Medication Sig Dispense Refill   ALPHA LIPOIC ACID PO Take 1 tablet by mouth daily. Daily     Biotin 10 MG TABS Take 1 tablet by mouth daily. Daily     calcium carbonate (OS-CAL) 600 MG TABS Take 1,200 mg by mouth 2 (two) times daily with a meal.     cholecalciferol (VITAMIN D) 1000 UNITS tablet Take 1,000 Units by mouth daily.     clotrimazole-betamethasone (LOTRISONE) cream APPLY TOPICALLY 2 (TWO) TIMES DAILY AS NEEDED. 30 g 0   diclofenac sodium (VOLTAREN) 1 % GEL Apply 2-4 grams to affected area up to 4 times daily (Patient taking differently: Apply 2-4 g topically 4 (four) times daily as needed (pain).) 4 Tube 2   famotidine (PEPCID) 20 MG tablet TAKE 1 TABLET (20 MG TOTAL) BY MOUTH DAILY. FOR HEARTBURN. 90 tablet 2   fenofibrate micronized (LOFIBRA) 134 MG capsule TAKE 1 CAPSULE (134 MG TOTAL) BY MOUTH AT BEDTIME. FOR CHOLESTEROL. 90 capsule 2   fish oil-omega-3 fatty acids 1000 MG capsule Take 2 g by mouth daily.     gabapentin (NEURONTIN) 100 MG capsule Take 1 capsule (100 mg total) by mouth 3 (three) times daily. 270 capsule 0   Ginger, Zingiber officinalis, (GINGER PO) Take 1 tablet by mouth daily.     levothyroxine (SYNTHROID) 50 MCG tablet TAKE 1 TABLET BY MOUTH EVERY MORNING WITH WATER ON AN EMPTY STOMACH. NO OTHER FOODS/MEDS FOR 30 MINS 90 tablet 1   NON FORMULARY Take 1 capsule by mouth daily. Tumeric      Daily      sulfamethoxazole-trimethoprim (BACTRIM DS) 800-160 MG tablet Take 1 tablet by mouth 2 (two) times daily.     tretinoin (RETIN-A) 0.05 % cream Apply 1 application topically daily as needed (as directed per dermatologist).   3   metoprolol tartrate (LOPRESSOR) 25 MG tablet TAKE 1 TAB BY MOUTH TWICE A DAY AND EXTRA TABLET AS NEEDED FOR BREAKTHROUGH PALPITATIONS AS DIRECTED 270 tablet 2   No current facility-administered medications for this  visit.     Review of Systems    Continues have intermittent and fleeting left upper chest discomfort at rest that has been stable over many years.  She has chronic dyspnea exertion which has been stable but tolerates exercise well.  She sometimes notes slight elevations in heart rates but overall, palpitations have been quiescent.  Occasional dependent ankle edema.  She denies PND, orthopnea, dizziness, syncope, or early satiety.  All other systems reviewed and are otherwise negative except as noted above.    Physical Exam    VS:  BP 120/68   Pulse 69   Ht 4\' 9"  (1.448 m)   Wt 153 lb (69.4 kg)   SpO2 95%   BMI 33.11 kg/m  ,  BMI Body mass index is 33.11 kg/m.     GEN: Well nourished, well developed, in no acute distress. HEENT: normal. Neck: Supple, no JVD, carotid bruits, or masses. Cardiac: RRR, 1/6 systolic murmur at the apex, no rubs or gallops. No clubbing, cyanosis, edema.  Radials/DP/PT 2+ and equal bilaterally.  Respiratory:  Respirations regular and unlabored, clear to auscultation bilaterally. GI: Soft, nontender, nondistended, BS + x 4. MS: no deformity or atrophy. Skin: warm and dry, no rash. Neuro:  Strength and sensation are intact. Psych: Normal affect.  Accessory Clinical Findings    ECG personally reviewed by me today -regular sinus rhythm, 69- no acute changes.  Lab Results  Component Value Date   WBC 3.6 (L) 09/19/2020   HGB 11.5 (L) 09/19/2020   HCT 34.5 (L) 09/19/2020   MCV 94.3 09/19/2020   PLT 190.0 09/19/2020   Lab Results  Component Value Date   CREATININE 1.03 09/19/2020   BUN 23 09/19/2020   NA 140 09/19/2020   K 4.3 09/19/2020   CL 103 09/19/2020   CO2 27 09/19/2020   Lab Results  Component Value Date   ALT 12 09/19/2020   AST 18 09/19/2020   ALKPHOS 50 09/19/2020   BILITOT 0.5 09/19/2020   Lab Results  Component Value Date   CHOL 167 09/19/2020   HDL 59.70 09/19/2020   LDLCALC 76 09/19/2020   LDLDIRECT 132.0 12/17/2007   TRIG  156.0 (H) 09/19/2020   CHOLHDL 3 09/19/2020    Lab Results  Component Value Date   HGBA1C 4.3 (L) 02/05/2019    Assessment & Plan    1.  Palpitations: Relatively quiescent over the past year.  She has only taking additional metoprolol on a few occasions and that is less because of palpitations and more because of slight elevations in heart rates.  She seems to notice when her heart rate is about 80.  Continue current beta-blocker dosing.  2.  Mild mitral regurgitation: She has chronic, stable dyspnea on exertion and only 1/6 systolic murmur on exam.  Last echo was in July 2018.  We will plan on following up next year.    3.  Left-sided chest pain: Patient with a long history of intermittent and fleeting left upper chest pain that occurs at rest but not with activity.  Discussed atypical nature of symptoms and encouraged by good exercise tolerance without symptoms.  No further work-up at this time.  4.  Hyperlipidemia: She is statin intolerant but LDL was 76 in June 2022 she remains on fenofibrate.  5.  Chronic back pain/fibromyalgia: Overall stable.  6.  Disposition: Follow-up in 1 year or sooner if necessary.  Plan for repeat echo at that time.   Murray Hodgkins, NP 03/23/2021, 11:10 AM

## 2021-03-23 NOTE — Patient Instructions (Signed)
Medication Instructions:  No changes at this time.  *If you need a refill on your cardiac medications before your next appointment, please call your pharmacy*   Lab Work: None  If you have labs (blood work) drawn today and your tests are completely normal, you will receive your results only by: [place] (if you have MyChart) OR A paper copy in the mail If you have any lab test that is abnormal or we need to change your treatment, we will call you to review the results.   Testing/Procedures: None   Follow-Up: At [facility], you and your health needs are our priority.  As part of our continuing mission to provide you with exceptional heart care, we have created designated Provider Care Teams.  These Care Teams include your primary Cardiologist (physician) and Advanced Practice Providers (APPs -  Physician Assistants and Nurse Practitioners) who all work together to provide you with the care you need, when you need it.   Your next appointment:   1 year(s)  The format for your next appointment:   In Person  Provider:   Ida Rogue, MD or Murray Hodgkins, NP

## 2021-03-26 DIAGNOSIS — T8579XD Infection and inflammatory reaction due to other internal prosthetic devices, implants and grafts, subsequent encounter: Secondary | ICD-10-CM | POA: Diagnosis not present

## 2021-05-17 DIAGNOSIS — M25562 Pain in left knee: Secondary | ICD-10-CM | POA: Diagnosis not present

## 2021-05-17 DIAGNOSIS — M25561 Pain in right knee: Secondary | ICD-10-CM | POA: Diagnosis not present

## 2021-05-17 DIAGNOSIS — M1712 Unilateral primary osteoarthritis, left knee: Secondary | ICD-10-CM | POA: Diagnosis not present

## 2021-05-17 DIAGNOSIS — M1711 Unilateral primary osteoarthritis, right knee: Secondary | ICD-10-CM | POA: Diagnosis not present

## 2021-06-02 ENCOUNTER — Other Ambulatory Visit: Payer: Self-pay | Admitting: Primary Care

## 2021-06-02 ENCOUNTER — Other Ambulatory Visit: Payer: Self-pay | Admitting: Physician Assistant

## 2021-06-02 DIAGNOSIS — E039 Hypothyroidism, unspecified: Secondary | ICD-10-CM

## 2021-06-13 NOTE — Progress Notes (Unsigned)
Office Visit Note  Patient: [name] [name]             Date of Birth: Nov 12, 1935           MRN: [id number]             PCP: [name], NP Referring: [name], NP Visit Date: 06/27/2021 Occupation: @GUAROCC @  Subjective:  No chief complaint on file.   History of Present Illness: [name] [name] is a 86 y.o. female ***   Activities of Daily Living:  Patient reports morning stiffness for *** {minute/hour:19697}.   Patient {ACTIONS;DENIES/REPORTS:21021675::"Denies"} nocturnal pain.  Difficulty dressing/grooming: {ACTIONS;DENIES/REPORTS:21021675::"Denies"} Difficulty climbing stairs: {ACTIONS;DENIES/REPORTS:21021675::"Denies"} Difficulty getting out of chair: {ACTIONS;DENIES/REPORTS:21021675::"Denies"} Difficulty using hands for taps, buttons, cutlery, and/or writing: {ACTIONS;DENIES/REPORTS:21021675::"Denies"}  No Rheumatology ROS completed.   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[place]) 02/10/2019   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   Chronic left shoulder pain 01/21/2018   Chronic pain of left knee 01/21/2018   Chronic pain of right knee 01/21/2018   Osteoarthritis of glenohumeral joint, left 01/21/2018   Decreased renal function 01/29/2017   Anemia 01/29/2017   Preventative health care 01/29/2017   Other fatigue 09/03/2016   Primary insomnia 09/03/2016   Primary osteoarthritis of both knees 09/03/2016   DJD (degenerative joint disease), cervical 09/03/2016   MRSA infection 07/31/2016   Chronic rhinitis 07/24/2016   Sinusitis chronic, sphenoidal 07/24/2016   Osteopenia 03/04/2016   Palpitations  09/19/2014   Thyroid nodule, uninodular 09/18/2011   Hypothyroid 08/19/2011   Postmenopausal HRT (hormone replacement therapy) 08/13/2011   Endocarditis 04/24/2011   Mitral regurgitation    Lumbar spine pain    Dyslipidemia    Chest pain    Statin intolerance    Mitral valve prolapse    Ejection fraction    Fibromyalgia    ADENOMATOUS COLONIC POLYP 08/15/2007   GERD 08/15/2007   BARRETTS ESOPHAGUS 08/15/2007   Diaphragmatic hernia 08/15/2007   Osteoarthritis 08/15/2007   CATARACT EXTRACTION, HX OF 08/15/2007   Hiatal hernia 08/15/2007    Past Medical History:  Diagnosis Date   Anxiety    prev on prozac   Chest pain    Felt to be noncardiac in the past   Chronic rectal fissure    Colon polyp    Dyslipidemia    Fibromyalgia    GERD (gastroesophageal reflux disease)    Barrett's esophagus   Hiatal hernia    Lumbar spine pain    Complex lumbar spine surgery at [place], 1962, complicated, MRSA, much of the appliance is removed, patient on chronic antibiotics   Melanoma ([place])    Mitral regurgitation    a. 2007 Echo Mild MR; b. 10/2016 Echo: EF 55-60%, no rwma, GrI DD, Mild MR.   Osteoarthritis    Scoliosis    Sinusitis    Multiple sinus operations in the past   Statin intolerance    As of 2009, no further attempts to use statins   Thyroid nodule     Family History  Problem Relation Age of Onset   GER disease Mother    Dementia Mother    Breast cancer Mother  Heart disease Father        MI at 28   Scoliosis Daughter    Colon cancer Neg Hx    Past Surgical History:  Procedure Laterality Date   ABDOMINAL HYSTERECTOMY     BACK SURGERY     CHOLECYSTECTOMY     FOOT SURGERY     HERNIA REPAIR     NASAL SINUS SURGERY     Social History   Social History Narrative   From [place]   Married (second [phone]   3 kids, 2 of whom are local   Retired, Armed forces technical officer History  Administered Date(s) Administered   Fluad Quad(high Dose 65+)  03/01/2020   Influenza Split 02/03/2012   Influenza,inj,Quad PF,6+ Mos 02/20/2017, 02/03/2018, 02/10/2019   Influenza-Unspecified 02/02/2016, 01/13/2017, 02/02/2018   Moderna Covid-19 Vaccine Bivalent Booster 72yrs & up 01/23/2021   Moderna Sars-Covid-2 Vaccination 04/26/2019, 05/25/2019, 02/11/2020, 08/01/2020   Pneumococcal Conjugate-13 02/10/2019   Pneumococcal Polysaccharide-23 02/03/2018   Td 02/03/2016   Tdap 10/22/2017     Objective: Vital Signs: There were no vitals taken for this visit.   Physical Exam   Musculoskeletal Exam: ***  CDAI Exam: CDAI Score: -- Patient Global: --; Provider Global: -- Swollen: --; Tender: -- Joint Exam 06/27/2021   No joint exam has been documented for this visit   There is currently no information documented on the homunculus. Go to the Rheumatology activity and complete the homunculus joint exam.  Investigation: No additional findings.  Imaging: No results found.  Recent Labs: Lab Results  Component Value Date   WBC 3.6 (L) 09/19/2020   HGB 11.5 (L) 09/19/2020   PLT 190.0 09/19/2020   NA 140 09/19/2020   K 4.3 09/19/2020   CL 103 09/19/2020   CO2 27 09/19/2020   GLUCOSE 102 (H) 09/19/2020   BUN 23 09/19/2020   CREATININE 1.03 09/19/2020   BILITOT 0.5 09/19/2020   ALKPHOS 50 09/19/2020   AST 18 09/19/2020   ALT 12 09/19/2020   PROT 6.4 09/19/2020   ALBUMIN 4.5 09/19/2020   CALCIUM 9.4 09/19/2020    Speciality Comments: Repeat DEXA due June 2021-ACY 11/18/2018  Procedures:  No procedures performed Allergies: Other, Fenofibric acid, Nsaids, Statins, Trilipix [choline fenofibrate], and Wasp venom   Assessment / Plan:     Visit Diagnoses: No diagnosis found.  Orders: No orders of the defined types were placed in this encounter.  No orders of the defined types were placed in this encounter.   Face-to-face time spent with patient was *** minutes. Greater than 50% of time was spent in counseling and coordination of  care.  Follow-Up Instructions: No follow-ups on file.   Earnestine Mealing, CMA  Note - This record has been created using Editor, commissioning.  Chart creation errors have been sought, but may not always  have been located. Such creation errors do not reflect on  the standard of medical care.

## 2021-06-20 ENCOUNTER — Encounter: Payer: Self-pay | Admitting: Orthopaedic Surgery

## 2021-06-20 ENCOUNTER — Ambulatory Visit (INDEPENDENT_AMBULATORY_CARE_PROVIDER_SITE_OTHER): Payer: Medicare Other | Admitting: Orthopaedic Surgery

## 2021-06-20 DIAGNOSIS — M1712 Unilateral primary osteoarthritis, left knee: Secondary | ICD-10-CM

## 2021-06-20 MED ORDER — LIDOCAINE HCL 1 % IJ SOLN
3.0000 mL | INTRAMUSCULAR | Status: AC | PRN
  Administered 2021-06-20: 3 mL

## 2021-06-20 MED ORDER — METHYLPREDNISOLONE ACETATE 40 MG/ML IJ SUSP
40.0000 mg | INTRAMUSCULAR | Status: AC | PRN
  Administered 2021-06-20: 40 mg via INTRA_ARTICULAR

## 2021-06-20 NOTE — Progress Notes (Signed)
? ?  Procedure Note ? ?Patient: [name] [name]             ?Date of Birth: 02-28-1936           ?MRN: [id number]             ?Visit Date: 06/20/2021 ?HPI: ? ?She returns today requesting bilateral knee injections.  She has upcoming right total knee arthroplasty with Dr. Lavone Neri in [place] on 08/08/2021.  She denies any injury to either knee.  She denies any recent falls.  Denies fevers chills.  She is nondiabetic.  She does have a history of MRSA after prior surgeries. ? ?Review of systems see HPI ? ?Physical exam: Left knee full range of motion.  No abnormal warmth erythema or effusion. ? ?Procedures: ?Visit Diagnoses:  ?1. Unilateral primary osteoarthritis, left knee   ? ? ?Large Joint Inj: L knee on 06/20/2021 10:50 AM ?Indications: pain ?Details: 22 G 1.5 in needle, anterolateral approach ? ?Arthrogram: No ? ?Medications: 3 mL lidocaine 1 %; 40 mg methylPREDNISolone acetate 40 MG/ML ?Outcome: tolerated well, no immediate complications ?Procedure, treatment alternatives, risks and benefits explained, specific risks discussed. Consent was given by the patient. Immediately prior to procedure a time out was called to verify the correct patient, procedure, equipment, support staff and site/side marked as required. Patient was prepped and draped in the usual sterile fashion.  ? ? ?Plan: Discussed with her that would not recommend cortisone injection in the right knee as she has upcoming total knee surgery in less than 2 months.  She tolerated the left knee injection well today.  She understands to wait at least 3 months between injections in the left knee.  Questions were encouraged and answered.  ? ? ?

## 2021-06-27 ENCOUNTER — Ambulatory Visit (INDEPENDENT_AMBULATORY_CARE_PROVIDER_SITE_OTHER): Payer: Medicare Other | Admitting: Physician Assistant

## 2021-06-27 ENCOUNTER — Other Ambulatory Visit: Payer: Self-pay

## 2021-06-27 ENCOUNTER — Encounter: Payer: Self-pay | Admitting: Physician Assistant

## 2021-06-27 VITALS — BP 119/74 | HR 67 | Ht <= 58 in | Wt 155.8 lb

## 2021-06-27 DIAGNOSIS — M19012 Primary osteoarthritis, left shoulder: Secondary | ICD-10-CM

## 2021-06-27 DIAGNOSIS — M797 Fibromyalgia: Secondary | ICD-10-CM

## 2021-06-27 DIAGNOSIS — Z9181 History of falling: Secondary | ICD-10-CM

## 2021-06-27 DIAGNOSIS — M8589 Other specified disorders of bone density and structure, multiple sites: Secondary | ICD-10-CM

## 2021-06-27 DIAGNOSIS — M51369 Other intervertebral disc degeneration, lumbar region without mention of lumbar back pain or lower extremity pain: Secondary | ICD-10-CM

## 2021-06-27 DIAGNOSIS — M5136 Other intervertebral disc degeneration, lumbar region: Secondary | ICD-10-CM

## 2021-06-27 DIAGNOSIS — M19041 Primary osteoarthritis, right hand: Secondary | ICD-10-CM | POA: Diagnosis not present

## 2021-06-27 DIAGNOSIS — Z8639 Personal history of other endocrine, nutritional and metabolic disease: Secondary | ICD-10-CM

## 2021-06-27 DIAGNOSIS — F5101 Primary insomnia: Secondary | ICD-10-CM

## 2021-06-27 DIAGNOSIS — M5030 Other cervical disc degeneration, unspecified cervical region: Secondary | ICD-10-CM | POA: Diagnosis not present

## 2021-06-27 DIAGNOSIS — M170 Bilateral primary osteoarthritis of knee: Secondary | ICD-10-CM

## 2021-06-27 DIAGNOSIS — Z862 Personal history of diseases of the blood and blood-forming organs and certain disorders involving the immune mechanism: Secondary | ICD-10-CM

## 2021-06-27 DIAGNOSIS — Z8679 Personal history of other diseases of the circulatory system: Secondary | ICD-10-CM

## 2021-06-27 DIAGNOSIS — R5383 Other fatigue: Secondary | ICD-10-CM

## 2021-06-27 DIAGNOSIS — Z8719 Personal history of other diseases of the digestive system: Secondary | ICD-10-CM

## 2021-06-27 NOTE — Patient Instructions (Signed)
Hand Exercises Hand exercises can be helpful for almost anyone. These exercises can strengthen the hands, improve flexibility and movement, and increase blood flow to the hands. These results can make work and daily tasks easier. Hand exercises can be especially helpful for people who have joint pain from arthritis or have nerve damage from overuse (carpal tunnel syndrome). These exercises can also help people who have injured a hand. Exercises Most of these hand exercises are gentle stretching and motion exercises. It is usually safe to do them often throughout the day. Warming up your hands before exercise may help to reduce stiffness. You can do this with gentle massage or by placing your hands in warm water for 10-15 minutes. It is normal to feel some stretching, pulling, tightness, or mild discomfort as you begin new exercises. This will gradually improve. Stop an exercise right away if you feel sudden, severe pain or your pain gets worse. Ask your health care provider which exercises are best for you. Knuckle bend or "claw" fist  Stand or sit with your arm, hand, and all five fingers pointed straight up. Make sure to keep your wrist straight during the exercise. Gently bend your fingers down toward your palm until the tips of your fingers are touching the top of your palm. Keep your big knuckle straight and just bend the small knuckles in your fingers. Hold this position for __________ seconds. Straighten (extend) your fingers back to the starting position. Repeat this exercise 5-10 times with each hand. Full finger fist  Stand or sit with your arm, hand, and all five fingers pointed straight up. Make sure to keep your wrist straight during the exercise. Gently bend your fingers into your palm until the tips of your fingers are touching the middle of your palm. Hold this position for __________ seconds. Extend your fingers back to the starting position, stretching every joint fully. Repeat  this exercise 5-10 times with each hand. Straight fist Stand or sit with your arm, hand, and all five fingers pointed straight up. Make sure to keep your wrist straight during the exercise. Gently bend your fingers at the big knuckle, where your fingers meet your hand, and the middle knuckle. Keep the knuckle at the tips of your fingers straight and try to touch the bottom of your palm. Hold this position for __________ seconds. Extend your fingers back to the starting position, stretching every joint fully. Repeat this exercise 5-10 times with each hand. Tabletop  Stand or sit with your arm, hand, and all five fingers pointed straight up. Make sure to keep your wrist straight during the exercise. Gently bend your fingers at the big knuckle, where your fingers meet your hand, as far down as you can while keeping the small knuckles in your fingers straight. Think of forming a tabletop with your fingers. Hold this position for __________ seconds. Extend your fingers back to the starting position, stretching every joint fully. Repeat this exercise 5-10 times with each hand. Finger spread  Place your hand flat on a table with your palm facing down. Make sure your wrist stays straight as you do this exercise. Spread your fingers and thumb apart from each other as far as you can until you feel a gentle stretch. Hold this position for __________ seconds. Bring your fingers and thumb tight together again. Hold this position for __________ seconds. Repeat this exercise 5-10 times with each hand. Making circles  Stand or sit with your arm, hand, and all five fingers pointed   straight up. Make sure to keep your wrist straight during the exercise. Make a circle by touching the tip of your thumb to the tip of your index finger. Hold for __________ seconds. Then open your hand wide. Repeat this motion with your thumb and each finger on your hand. Repeat this exercise 5-10 times with each hand. Thumb  motion  Sit with your forearm resting on a table and your wrist straight. Your thumb should be facing up toward the ceiling. Keep your fingers relaxed as you move your thumb. Lift your thumb up as high as you can toward the ceiling. Hold for __________ seconds. Bend your thumb across your palm as far as you can, reaching the tip of your thumb for the small finger (pinkie) side of your palm. Hold for __________ seconds. Repeat this exercise 5-10 times with each hand. Grip strengthening  Hold a stress ball or other soft ball in the middle of your hand. Slowly increase the pressure, squeezing the ball as much as you can without causing pain. Think of bringing the tips of your fingers into the middle of your palm. All of your finger joints should bend when doing this exercise. Hold your squeeze for __________ seconds, then relax. Repeat this exercise 5-10 times with each hand. Contact a health care provider if: Your hand pain or discomfort gets much worse when you do an exercise. Your hand pain or discomfort does not improve within 2 hours after you exercise. If you have any of these problems, stop doing these exercises right away. Do not do them again unless your health care provider says that you can. Get help right away if: You develop sudden, severe hand pain or swelling. If this happens, stop doing these exercises right away. Do not do them again unless your health care provider says that you can. This information is not intended to replace advice given to you by your health care provider. Make sure you discuss any questions you have with your health care provider. Document Revised: 07/20/2020 Document Reviewed: 07/20/2020 Elsevier Patient Education  2022 Elsevier Inc.  

## 2021-07-03 DIAGNOSIS — D225 Melanocytic nevi of trunk: Secondary | ICD-10-CM | POA: Diagnosis not present

## 2021-07-03 DIAGNOSIS — L814 Other melanin hyperpigmentation: Secondary | ICD-10-CM | POA: Diagnosis not present

## 2021-07-03 DIAGNOSIS — L821 Other seborrheic keratosis: Secondary | ICD-10-CM | POA: Diagnosis not present

## 2021-07-03 DIAGNOSIS — D2261 Melanocytic nevi of right upper limb, including shoulder: Secondary | ICD-10-CM | POA: Diagnosis not present

## 2021-07-25 DIAGNOSIS — M1711 Unilateral primary osteoarthritis, right knee: Secondary | ICD-10-CM | POA: Diagnosis not present

## 2021-07-25 DIAGNOSIS — K219 Gastro-esophageal reflux disease without esophagitis: Secondary | ICD-10-CM | POA: Diagnosis not present

## 2021-07-25 DIAGNOSIS — Z01818 Encounter for other preprocedural examination: Secondary | ICD-10-CM | POA: Diagnosis not present

## 2021-07-25 DIAGNOSIS — E039 Hypothyroidism, unspecified: Secondary | ICD-10-CM | POA: Diagnosis not present

## 2021-08-08 DIAGNOSIS — G629 Polyneuropathy, unspecified: Secondary | ICD-10-CM | POA: Diagnosis not present

## 2021-08-08 DIAGNOSIS — Z888 Allergy status to other drugs, medicaments and biological substances status: Secondary | ICD-10-CM | POA: Diagnosis not present

## 2021-08-08 DIAGNOSIS — J9601 Acute respiratory failure with hypoxia: Secondary | ICD-10-CM | POA: Diagnosis not present

## 2021-08-08 DIAGNOSIS — D62 Acute posthemorrhagic anemia: Secondary | ICD-10-CM | POA: Diagnosis not present

## 2021-08-08 DIAGNOSIS — Z6834 Body mass index (BMI) 34.0-34.9, adult: Secondary | ICD-10-CM | POA: Diagnosis not present

## 2021-08-08 DIAGNOSIS — I341 Nonrheumatic mitral (valve) prolapse: Secondary | ICD-10-CM | POA: Diagnosis not present

## 2021-08-08 DIAGNOSIS — G8918 Other acute postprocedural pain: Secondary | ICD-10-CM | POA: Diagnosis not present

## 2021-08-08 DIAGNOSIS — I2694 Multiple subsegmental pulmonary emboli without acute cor pulmonale: Secondary | ICD-10-CM | POA: Diagnosis not present

## 2021-08-08 DIAGNOSIS — K219 Gastro-esophageal reflux disease without esophagitis: Secondary | ICD-10-CM | POA: Diagnosis not present

## 2021-08-08 DIAGNOSIS — Z7952 Long term (current) use of systemic steroids: Secondary | ICD-10-CM | POA: Diagnosis not present

## 2021-08-08 DIAGNOSIS — Z886 Allergy status to analgesic agent status: Secondary | ICD-10-CM | POA: Diagnosis not present

## 2021-08-08 DIAGNOSIS — R112 Nausea with vomiting, unspecified: Secondary | ICD-10-CM | POA: Diagnosis not present

## 2021-08-08 DIAGNOSIS — Z96651 Presence of right artificial knee joint: Secondary | ICD-10-CM | POA: Diagnosis not present

## 2021-08-08 DIAGNOSIS — D696 Thrombocytopenia, unspecified: Secondary | ICD-10-CM | POA: Diagnosis not present

## 2021-08-08 DIAGNOSIS — Z79899 Other long term (current) drug therapy: Secondary | ICD-10-CM | POA: Diagnosis not present

## 2021-08-08 DIAGNOSIS — I2699 Other pulmonary embolism without acute cor pulmonale: Secondary | ICD-10-CM | POA: Diagnosis not present

## 2021-08-08 DIAGNOSIS — Z91030 Bee allergy status: Secondary | ICD-10-CM | POA: Diagnosis not present

## 2021-08-08 DIAGNOSIS — M1711 Unilateral primary osteoarthritis, right knee: Secondary | ICD-10-CM | POA: Diagnosis not present

## 2021-08-08 DIAGNOSIS — E559 Vitamin D deficiency, unspecified: Secondary | ICD-10-CM | POA: Diagnosis not present

## 2021-08-08 DIAGNOSIS — Z792 Long term (current) use of antibiotics: Secondary | ICD-10-CM | POA: Diagnosis not present

## 2021-08-08 DIAGNOSIS — E669 Obesity, unspecified: Secondary | ICD-10-CM | POA: Diagnosis not present

## 2021-08-08 DIAGNOSIS — Z471 Aftercare following joint replacement surgery: Secondary | ICD-10-CM | POA: Diagnosis not present

## 2021-08-08 DIAGNOSIS — J9811 Atelectasis: Secondary | ICD-10-CM | POA: Diagnosis not present

## 2021-08-08 DIAGNOSIS — K5900 Constipation, unspecified: Secondary | ICD-10-CM | POA: Diagnosis not present

## 2021-08-08 HISTORY — PX: KNEE SURGERY: SHX244

## 2021-08-20 ENCOUNTER — Telehealth: Payer: Self-pay | Admitting: Primary Care

## 2021-08-20 NOTE — Telephone Encounter (Signed)
Called ans spoke to patient today tried to get visit set up. She having increased nausea. And low grade fever at times. She has history of MRSA and family is concerned that she may be having infection. I have tried to get patient to come in office today to be seen but refuses. States she doesn't feel like she can come in. Daughter does not think she will come in. She would like to see Anda Kraft only. I have worked her in on Wednesday at 320. That was the first time they felt like they can come in. I have reviewed red words with daugher and patient and agreed if any they will go to ED or urgent care.   ?

## 2021-08-20 NOTE — Telephone Encounter (Signed)
Noted. Will evaluate as scheduled. ?Agree with ED precautions.  ?

## 2021-08-20 NOTE — Telephone Encounter (Signed)
Pt daughter called and is trying get pt scheduled for a follow up from her knee surgery on 08/08/21, there were complications as well and they found blood clots as well, they also said that pt had merca but it was dorment. Katherine's next appt was next Tuesday but they dont want to wait that long. Would she be able to be worked in? Callback is [phone] ?

## 2021-08-22 ENCOUNTER — Ambulatory Visit (INDEPENDENT_AMBULATORY_CARE_PROVIDER_SITE_OTHER): Payer: Medicare Other | Admitting: Primary Care

## 2021-08-22 VITALS — BP 110/82 | HR 76 | Temp 97.6°F | Ht <= 58 in | Wt 148.0 lb

## 2021-08-22 DIAGNOSIS — D649 Anemia, unspecified: Secondary | ICD-10-CM

## 2021-08-22 DIAGNOSIS — K219 Gastro-esophageal reflux disease without esophagitis: Secondary | ICD-10-CM | POA: Diagnosis not present

## 2021-08-22 DIAGNOSIS — I2699 Other pulmonary embolism without acute cor pulmonale: Secondary | ICD-10-CM

## 2021-08-22 DIAGNOSIS — Z96651 Presence of right artificial knee joint: Secondary | ICD-10-CM

## 2021-08-22 DIAGNOSIS — F411 Generalized anxiety disorder: Secondary | ICD-10-CM | POA: Diagnosis not present

## 2021-08-22 DIAGNOSIS — R112 Nausea with vomiting, unspecified: Secondary | ICD-10-CM | POA: Insufficient documentation

## 2021-08-22 DIAGNOSIS — K449 Diaphragmatic hernia without obstruction or gangrene: Secondary | ICD-10-CM | POA: Diagnosis not present

## 2021-08-22 HISTORY — DX: Nausea with vomiting, unspecified: R11.2

## 2021-08-22 HISTORY — DX: Other pulmonary embolism without acute cor pulmonale: I26.99

## 2021-08-22 MED ORDER — SERTRALINE HCL 25 MG PO TABS: 25.0000 mg | ORAL_TABLET | Freq: Every day | ORAL | 0 refills | Status: DC

## 2021-08-22 MED ORDER — OMEPRAZOLE 20 MG PO CPDR: 20.0000 mg | DELAYED_RELEASE_CAPSULE | Freq: Every day | ORAL | 0 refills | Status: DC

## 2021-08-22 NOTE — Assessment & Plan Note (Signed)
Secondary to anesthesia and postoperative complications. ? ?Continue Zofran 4 to 8 mg every 8 hours as needed.  They will update if they need a refill. ? ?Checking labs today including CBC and BMP. ?

## 2021-08-22 NOTE — Assessment & Plan Note (Signed)
No improvement with famotidine 40 mg. ? ?Do suspect her nausea and dry heaving are contributing to some of her symptoms. ? ?Trial of omeprazole 20 mg sent to pharmacy.  She will take this once daily. ?

## 2021-08-22 NOTE — Assessment & Plan Note (Signed)
Recent surgery and unexpected hospitalization with complications through [facility]. ? ?Reviewed hospital admission notes, labs, imaging. ? ?Continue Eliquis 5 mg twice daily for a total of 3 months minimum as PE was provoked. ? ?Continue Zofran 4 to 8 mg every 8 hours as needed for nausea/vomiting. ? ?Checking CBC and BMP today.  ? ?She will follow-up with her surgeon as scheduled.  Continue home health physical therapy. ?

## 2021-08-22 NOTE — Assessment & Plan Note (Signed)
Postoperatively during recent right total knee arthroplasty. ? ?Continue Eliquis 5 mg twice daily for a total of 3 months minimum. ? ? ?

## 2021-08-22 NOTE — Progress Notes (Signed)
? ?Subjective:  ? ? Patient ID: [name] [name], female    DOB: 1936-01-21, 86 y.o.   MRN: [id number] ? ?HPI ? ?[name] [name] is a very pleasant 86 y.o. female with a history of Barrett's esophagus, osteopenia, osteoarthritis, chest pain, fatigue, decreased renal function of the knees who presents today to discuss nausea and vomiting postoperatively. She would also like to discuss anxiety. Her daughter joins [place]. ? ?1) Hospital Follow Up: ? ?Admitted on 08/08/2021 to [facility] for total knee right knee arthroplasty. Complications from surgery included post operative nausea and vomiting, bilateral upper and lower pulmonary emboli, post operative anemia from blood loss, post operative hypoxia. She was initiated on Eliquis 10 mg BID x 7 days, then 5 mg BID; Zofran 4 mg PRN, and Tramadol 50 mg PRN. ? ?She was discharged home on 08/13/21. Since then she continues to experience nausea and vomiting. Her nausea is daily and constant. She dry heaves mostly. She notified her surgeon of these symptoms and she was told to see her PCP. She has been taking Zofran 4 mg every 8 hours with some improvement. She was provided with phenergan during her hospital stay that caused anxiety.  She has a history of nausea with vomiting after anesthesia. ? ?She has a history of hiatal hernia, takes famotidine 20 mg once daily typically, increased famotidine to 40 mg daily two days ago without improvement in symptoms. Her daughter suspect some of her symptoms are secondary to her hiatal hernia. ? ?She is trying to hydrate well with water. She is eating bland foods, is tolerating for the most part. She denies shortness of breath and chest pain. She has been visited by home health PT three times weekly.  ? ?She is scheduled to see her surgeon in late May 2023.  ? ?2) Anxiety: Chronic and intermittent throughout the years, worse since her recent surgery. Symptoms include feeling nervous and anxious, worrying, getting upset. Her daughter has noticed  increased anxiety over the last several years, "her nerves are shot". She is also frustrated by lack of independence.  ? ?She was once managed on Clonazepam as needed.  ? ?Review of Systems  ?Gastrointestinal:  Positive for abdominal pain, nausea and vomiting. Negative for diarrhea.  ?Skin:  Negative for color change.  ?Psychiatric/Behavioral:  The patient is nervous/anxious.   ? ?   ? ? ?Past Medical History:  ?Diagnosis Date  ? Anxiety   ? prev on prozac  ? Chest pain   ? Felt to be noncardiac in the past  ? Chronic rectal fissure   ? Colon polyp   ? Dyslipidemia   ? Fibromyalgia   ? GERD (gastroesophageal reflux disease)   ? Barrett's esophagus  ? Hiatal hernia   ? Lumbar spine pain   ? Complex lumbar spine surgery at [facility], 3474, complicated, MRSA, much of the appliance is removed, patient on chronic antibiotics  ? Melanoma ([place])   ? Mitral regurgitation   ? a. 2007 Echo Mild MR; b. 10/2016 Echo: EF 55-60%, no rwma, GrI DD, Mild MR.  ? Osteoarthritis   ? Scoliosis   ? Sinusitis   ? Multiple sinus operations in the past  ? Statin intolerance   ? As of 2009, no further attempts to use statins  ? Thyroid nodule   ? ? ?Social History  ? ?Socioeconomic History  ? Marital status: Married  ?  Spouse name: Not on file  ? Number of children: Not on file  ? Years of  education: Not on file  ? Highest education level: Not on file  ?Occupational History  ? Not on file  ?Tobacco Use  ? Smoking status: Never  ?  Passive exposure: Never  ? Smokeless tobacco: Never  ?Vaping Use  ? Vaping Use: Never used  ?Substance and Sexual Activity  ? Alcohol use: No  ? Drug use: No  ? Sexual activity: Not on file  ?Other Topics Concern  ? Not on file  ?Social History Narrative  ? From [facility]  ? Married (second [phone]  ? 3 kids, 2 of whom are local  ? Retired, Camera operator  ? ?Social Determinants of Health  ? ?Financial Resource Strain: Low Risk   ? Difficulty of Paying Living Expenses: Not hard at all  ?Food Insecurity: No Food  Insecurity  ? Worried About Charity fundraiser in the Last Year: Never true  ? Ran Out of Food in the Last Year: Never true  ?Transportation Needs: No Transportation Needs  ? Lack of Transportation (Medical): No  ? Lack of Transportation (Non-Medical): No  ?Physical Activity: Insufficiently Active  ? Days of Exercise per Week: 3 days  ? Minutes of Exercise per Session: 40 min  ?Stress: No Stress Concern Present  ? Feeling of Stress : Not at all  ?Social Connections: Not on file  ?Intimate Partner Violence: Not At Risk  ? Fear of Current or Ex-Partner: No  ? Emotionally Abused: No  ? Physically Abused: No  ? Sexually Abused: No  ? ? ?Past Surgical History:  ?Procedure Laterality Date  ? ABDOMINAL HYSTERECTOMY    ? BACK SURGERY    ? CHOLECYSTECTOMY    ? FOOT SURGERY    ? HERNIA REPAIR    ? NASAL SINUS SURGERY    ? ? ?Family History  ?Problem Relation Age of Onset  ? GER disease Mother   ? Dementia Mother   ? Breast cancer Mother   ? Heart disease Father   ?     MI at 38  ? Scoliosis Daughter   ? Colon cancer Neg Hx   ? ? ?Allergies  ?Allergen Reactions  ? Other Other (See Comments)  ?  Other Reaction: when taking for an extended period of time causes mouth ulcers and esophagus irritation ?Other Reaction: when taking for an extended period of time causes mouth ulcers and esophagus irritation ?  ? Fenofibric Acid Other (See Comments)  ?  GI upset  ? Nsaids   ?  REACTION: No long term use  ? Statins Other (See Comments)  ?  Intolerant, myalgias ?Intolerant, myalgias ?Intolerant, myalgias ?Intolerant, myalgias ?Intolerant, myalgias ?Intolerant, myalgias ?  ? Trilipix [Choline Fenofibrate]   ?  GI upset  ? Wasp Venom Swelling  ? ? ?Current Outpatient Medications on File Prior to Visit  ?Medication Sig Dispense Refill  ? ALPHA LIPOIC ACID PO Take 1 tablet by mouth daily. Daily    ? calcium carbonate (OS-CAL) 600 MG TABS Take 1,200 mg by mouth 2 (two) times daily with a meal.    ? cholecalciferol (VITAMIN D) 1000 UNITS  tablet Take 1,000 Units by mouth daily.    ? famotidine (PEPCID) 20 MG tablet TAKE 1 TABLET (20 MG TOTAL) BY MOUTH DAILY. FOR HEARTBURN. 90 tablet 2  ? fenofibrate micronized (LOFIBRA) 134 MG capsule TAKE 1 CAPSULE (134 MG TOTAL) BY MOUTH AT BEDTIME. FOR CHOLESTEROL. 90 capsule 2  ? gabapentin (NEURONTIN) 100 MG capsule TAKE 1 CAPSULE (100 MG TOTAL) BY  MOUTH THREE TIMES DAILY. 270 capsule 0  ? Ginger, Zingiber officinalis, (GINGER PO) Take 1 tablet by mouth daily.    ? levothyroxine (SYNTHROID) 50 MCG tablet TAKE 1 TABLET BY MOUTH EVERY MORNING WITH WATER ON AN EMPTY STOMACH. NO OTHER FOODS/MEDS FOR 30 MINS 90 tablet 0  ? metoprolol tartrate (LOPRESSOR) 25 MG tablet TAKE 1 TAB BY MOUTH TWICE A DAY AND EXTRA TABLET AS NEEDED FOR BREAKTHROUGH PALPITATIONS AS DIRECTED 270 tablet 2  ? sulfamethoxazole-trimethoprim (BACTRIM DS) 800-160 MG tablet Take 1 tablet by mouth 2 (two) times daily.    ? tretinoin (RETIN-A) 0.05 % cream Apply 1 application topically daily as needed (as directed per dermatologist).   3  ? clotrimazole-betamethasone (LOTRISONE) cream APPLY TOPICALLY 2 (TWO) TIMES DAILY AS NEEDED. (Patient not taking: Reported on 08/22/2021) 30 g 0  ? diclofenac sodium (VOLTAREN) 1 % GEL Apply 2-4 grams to affected area up to 4 times daily (Patient not taking: Reported on 08/22/2021) 4 Tube 2  ? ELIQUIS 5 MG TABS tablet PLEASE SEE ATTACHED FOR DETAILED DIRECTIONS    ? fish oil-omega-3 fatty acids 1000 MG capsule Take 2 g by mouth daily. (Patient not taking: Reported on 08/22/2021)    ? NON FORMULARY Take 1 capsule by mouth daily. Tumeric      Daily ? (Patient not taking: Reported on 08/22/2021)    ? ondansetron (ZOFRAN-ODT) 4 MG disintegrating tablet Take 4 mg by mouth every 8 (eight) hours as needed.    ? traMADol (ULTRAM) 50 MG tablet PLEASE SEE ATTACHED FOR DETAILED DIRECTIONS    ? ?No current facility-administered medications on file prior to visit.  ? ? ?BP 110/82   Pulse 76   Temp 97.6 ?F (36.4 ?C) (Oral)   Ht 4'  8.5" (1.435 m)   Wt 148 lb (67.1 kg)   SpO2 98%   BMI 32.60 kg/m?  ?Objective:  ? Physical Exam ?Cardiovascular:  ?   Rate and Rhythm: Normal rate and regular rhythm.  ?Pulmonary:  ?   Effort: Pulmonary effo

## 2021-08-22 NOTE — Patient Instructions (Addendum)
Start omeprazole 20 mg daily for stomach pain/hiatal hernia. ? ?Start sertraline (Zoloft) 25 mg for anxiety.  Take 1 tablet by mouth once daily. ? ?Continue apixaban (Eliquis) 5 mg twice daily for total of 3 months.  Please notify me when you need a refill. ? ?Stop by the lab prior to leaving today. I will notify you of your results once received.  ? ?It was a pleasure to see you today! ? ?

## 2021-08-22 NOTE — Assessment & Plan Note (Signed)
Deteriorated since recent surgery. ? ?Discussed several options with patient and her daughter, we decided to start sertraline 25 mg daily. ? ?We discussed possible side effects of headache, GI upset, drowsiness, and SI/HI. ?If thoughts of SI/HI develop, we discussed to present to the emergency immediately. ?Patient verbalized understanding.  ? ?She will update in 4 weeks. ? ?

## 2021-08-23 LAB — CBC WITH DIFFERENTIAL/PLATELET
Basophils Absolute: 0.1 10*3/uL (ref 0.0–0.1)
Basophils Relative: 1.1 % (ref 0.0–3.0)
Eosinophils Absolute: 0.2 10*3/uL (ref 0.0–0.7)
Eosinophils Relative: 2 % (ref 0.0–5.0)
HCT: 32.6 % — ABNORMAL LOW (ref 36.0–46.0)
Hemoglobin: 10.3 g/dL — ABNORMAL LOW (ref 12.0–15.0)
Lymphocytes Relative: 17.7 % (ref 12.0–46.0)
Lymphs Abs: 1.4 10*3/uL (ref 0.7–4.0)
MCHC: 31.6 g/dL (ref 30.0–36.0)
MCV: 98.9 fl (ref 78.0–100.0)
Monocytes Absolute: 0.6 10*3/uL (ref 0.1–1.0)
Monocytes Relative: 8.1 % (ref 3.0–12.0)
Neutro Abs: 5.5 10*3/uL (ref 1.4–7.7)
Neutrophils Relative %: 71.1 % (ref 43.0–77.0)
Platelets: 364 10*3/uL (ref 150.0–400.0)
RBC: 3.3 Mil/uL — ABNORMAL LOW (ref 3.87–5.11)
RDW: 17.5 % — ABNORMAL HIGH (ref 11.5–15.5)
WBC: 7.7 10*3/uL (ref 4.0–10.5)

## 2021-08-23 LAB — BASIC METABOLIC PANEL
BUN: 18 mg/dL (ref 6–23)
CO2: 28 mEq/L (ref 19–32)
Calcium: 9.5 mg/dL (ref 8.4–10.5)
Chloride: 96 mEq/L (ref 96–112)
Creatinine, Ser: 1.06 mg/dL (ref 0.40–1.20)
GFR: 48.31 mL/min — ABNORMAL LOW (ref >60.00)
Glucose, Bld: 100 mg/dL — ABNORMAL HIGH (ref 70–99)
Potassium: 4.7 mEq/L (ref 3.5–5.1)
Sodium: 134 mEq/L — ABNORMAL LOW (ref 135–145)

## 2021-08-31 DIAGNOSIS — Z9181 History of falling: Secondary | ICD-10-CM

## 2021-08-31 DIAGNOSIS — Z7901 Long term (current) use of anticoagulants: Secondary | ICD-10-CM

## 2021-08-31 DIAGNOSIS — I058 Other rheumatic mitral valve diseases: Secondary | ICD-10-CM

## 2021-08-31 DIAGNOSIS — E079 Disorder of thyroid, unspecified: Secondary | ICD-10-CM

## 2021-08-31 DIAGNOSIS — D696 Thrombocytopenia, unspecified: Secondary | ICD-10-CM

## 2021-08-31 DIAGNOSIS — J9601 Acute respiratory failure with hypoxia: Secondary | ICD-10-CM

## 2021-08-31 DIAGNOSIS — E559 Vitamin D deficiency, unspecified: Secondary | ICD-10-CM

## 2021-08-31 DIAGNOSIS — Z96651 Presence of right artificial knee joint: Secondary | ICD-10-CM | POA: Diagnosis not present

## 2021-08-31 DIAGNOSIS — I2699 Other pulmonary embolism without acute cor pulmonale: Secondary | ICD-10-CM | POA: Diagnosis not present

## 2021-08-31 DIAGNOSIS — Z471 Aftercare following joint replacement surgery: Secondary | ICD-10-CM | POA: Diagnosis not present

## 2021-08-31 DIAGNOSIS — D62 Acute posthemorrhagic anemia: Secondary | ICD-10-CM

## 2021-08-31 DIAGNOSIS — D539 Nutritional anemia, unspecified: Secondary | ICD-10-CM

## 2021-08-31 DIAGNOSIS — I471 Supraventricular tachycardia: Secondary | ICD-10-CM

## 2021-08-31 DIAGNOSIS — G629 Polyneuropathy, unspecified: Secondary | ICD-10-CM | POA: Diagnosis not present

## 2021-09-02 ENCOUNTER — Other Ambulatory Visit: Payer: Self-pay | Admitting: Physician Assistant

## 2021-09-03 DIAGNOSIS — M25561 Pain in right knee: Secondary | ICD-10-CM | POA: Diagnosis not present

## 2021-09-03 DIAGNOSIS — Z96651 Presence of right artificial knee joint: Secondary | ICD-10-CM | POA: Diagnosis not present

## 2021-09-12 ENCOUNTER — Telehealth: Payer: Self-pay | Admitting: Primary Care

## 2021-09-12 DIAGNOSIS — K219 Gastro-esophageal reflux disease without esophagitis: Secondary | ICD-10-CM

## 2021-09-12 DIAGNOSIS — E039 Hypothyroidism, unspecified: Secondary | ICD-10-CM

## 2021-09-13 ENCOUNTER — Telehealth: Payer: Self-pay | Admitting: Primary Care

## 2021-09-13 DIAGNOSIS — I2699 Other pulmonary embolism without acute cor pulmonale: Secondary | ICD-10-CM

## 2021-09-13 MED ORDER — APIXABAN 5 MG PO TABS: 5.0000 mg | ORAL_TABLET | Freq: Two times a day (BID) | ORAL | 0 refills | Status: DC

## 2021-09-13 NOTE — Telephone Encounter (Signed)
  Encourage patient to contact the pharmacy for refills or they can request refills through [place]:  Please schedule appointment if longer than 1 year  NEXT APPOINTMENT DATE:  MEDICATION:ELIQUIS 5 MG TABS tablet  Is the patient out of medication?   [id number] [place] [place]:  [phone]     Let patient know to contact pharmacy at the end of the day to make sure medication is ready.  Please notify patient to allow 48-72 hours to process  CLINICAL FILLS OUT ALL BELOW:   LAST REFILL:  QTY:  REFILL DATE:    OTHER COMMENTS:    Okay for refill?  Please advise

## 2021-09-13 NOTE — Telephone Encounter (Signed)
Placed in your box for review.  °

## 2021-09-13 NOTE — Telephone Encounter (Signed)
Refills sent to pharmacy. 

## 2021-09-13 NOTE — Telephone Encounter (Signed)
Type of forms received:Parking Placard  Routed XA:[id number] T  Paperwork received by : Gwynn Burly   Individual made aware of 3-5 business day turn around (Y/N): Y  Form completed and patient made aware of charges(Y/N): Y   Faxed to :   Form location: Place in folder

## 2021-09-14 NOTE — Telephone Encounter (Signed)
Completed and placed in Joellen's inbox. 

## 2021-09-18 NOTE — Telephone Encounter (Signed)
Called patient she will come by and pick up at reception

## 2021-09-20 ENCOUNTER — Ambulatory Visit: Payer: Medicare Other

## 2021-09-21 ENCOUNTER — Other Ambulatory Visit: Payer: Self-pay | Admitting: Primary Care

## 2021-09-21 DIAGNOSIS — E039 Hypothyroidism, unspecified: Secondary | ICD-10-CM

## 2021-09-21 DIAGNOSIS — D649 Anemia, unspecified: Secondary | ICD-10-CM

## 2021-09-21 DIAGNOSIS — E785 Hyperlipidemia, unspecified: Secondary | ICD-10-CM

## 2021-09-24 DIAGNOSIS — M25661 Stiffness of right knee, not elsewhere classified: Secondary | ICD-10-CM | POA: Diagnosis not present

## 2021-09-24 DIAGNOSIS — R262 Difficulty in walking, not elsewhere classified: Secondary | ICD-10-CM | POA: Diagnosis not present

## 2021-09-24 DIAGNOSIS — M25561 Pain in right knee: Secondary | ICD-10-CM | POA: Diagnosis not present

## 2021-09-24 DIAGNOSIS — M6281 Muscle weakness (generalized): Secondary | ICD-10-CM | POA: Diagnosis not present

## 2021-09-25 ENCOUNTER — Other Ambulatory Visit: Payer: Self-pay | Admitting: Primary Care

## 2021-09-25 DIAGNOSIS — I2699 Other pulmonary embolism without acute cor pulmonale: Secondary | ICD-10-CM

## 2021-09-26 DIAGNOSIS — R262 Difficulty in walking, not elsewhere classified: Secondary | ICD-10-CM | POA: Diagnosis not present

## 2021-09-26 DIAGNOSIS — M6281 Muscle weakness (generalized): Secondary | ICD-10-CM | POA: Diagnosis not present

## 2021-09-26 DIAGNOSIS — M25661 Stiffness of right knee, not elsewhere classified: Secondary | ICD-10-CM | POA: Diagnosis not present

## 2021-09-26 DIAGNOSIS — M25561 Pain in right knee: Secondary | ICD-10-CM | POA: Diagnosis not present

## 2021-10-01 DIAGNOSIS — M6281 Muscle weakness (generalized): Secondary | ICD-10-CM | POA: Diagnosis not present

## 2021-10-01 DIAGNOSIS — R262 Difficulty in walking, not elsewhere classified: Secondary | ICD-10-CM | POA: Diagnosis not present

## 2021-10-01 DIAGNOSIS — M25561 Pain in right knee: Secondary | ICD-10-CM | POA: Diagnosis not present

## 2021-10-01 DIAGNOSIS — M25661 Stiffness of right knee, not elsewhere classified: Secondary | ICD-10-CM | POA: Diagnosis not present

## 2021-10-02 ENCOUNTER — Ambulatory Visit (INDEPENDENT_AMBULATORY_CARE_PROVIDER_SITE_OTHER): Payer: Medicare Other

## 2021-10-02 VITALS — Ht <= 58 in | Wt 148.0 lb

## 2021-10-02 DIAGNOSIS — Z0000 Encounter for general adult medical examination without abnormal findings: Secondary | ICD-10-CM | POA: Diagnosis not present

## 2021-10-02 NOTE — Progress Notes (Cosign Needed)
Subjective:   [name] [name] is a 86 y.o. female who presents for Medicare Annual (Subsequent) preventive examination.  Review of Systems    Virtual Visit via Telephone Note  I connected with  [name] [name] on 10/02/21 at  9:15 AM EDT by telephone and verified that I am speaking with the correct person using two identifiers.  Location: Patient: Home Provider: Office Persons participating in the virtual visit: patient/Nurse Health Advisor   I discussed the limitations, risks, security and privacy concerns of performing an evaluation and management service by telephone and the availability of in person appointments. The patient expressed understanding and agreed to proceed.  Interactive audio and video telecommunications were attempted between this nurse and patient, however failed, due to patient having technical difficulties OR patient did not have access to video capability.  We continued and completed visit with audio only.  Some vital signs may be absent or patient reported.   Criselda Peaches, LPN  Cardiac Risk Factors include: advanced age (>81mn, >>69women)     Objective:    Today's Vitals   10/02/21 1117  Weight: 148 lb (67.1 kg)  Height: 4' 8.5" (1.435 m)   Body mass index is 32.6 kg/m.     10/02/2021   11:27 AM 09/19/2020   10:29 AM 02/05/2019    9:04 AM 01/27/2017   10:13 AM  Advanced Directives  Does Patient Have a Medical Advance Directive? No No No No  Would patient like information on creating a medical advance directive? No - Patient declined No - Patient declined No - Patient declined     Current Medications (verified) Outpatient Encounter Medications as of 10/02/2021  Medication Sig   ALPHA LIPOIC ACID PO Take 1 tablet by mouth daily. Daily   apixaban (ELIQUIS) 5 MG TABS tablet Take 1 tablet (5 mg total) by mouth 2 (two) times daily.   calcium carbonate (OS-CAL) 600 MG TABS Take 1,200 mg by mouth 2 (two) times daily with a meal.   cholecalciferol  (VITAMIN D) 1000 UNITS tablet Take 1,000 Units by mouth daily.   clotrimazole-betamethasone (LOTRISONE) cream APPLY TOPICALLY 2 (TWO) TIMES DAILY AS NEEDED. (Patient not taking: Reported on 08/22/2021)   diclofenac sodium (VOLTAREN) 1 % GEL Apply 2-4 grams to affected area up to 4 times daily (Patient not taking: Reported on 08/22/2021)   famotidine (PEPCID) 20 MG tablet TAKE 1 TABLET (20 MG TOTAL) BY MOUTH DAILY. FOR HEARTBURN.   fenofibrate micronized (LOFIBRA) 134 MG capsule TAKE 1 CAPSULE (134 MG TOTAL) BY MOUTH AT BEDTIME. FOR CHOLESTEROL.   fish oil-omega-3 fatty acids 1000 MG capsule Take 2 g by mouth daily. (Patient not taking: Reported on 08/22/2021)   gabapentin (NEURONTIN) 100 MG capsule TAKE 1 CAPSULE (100 MG TOTAL) BY MOUTH 3 TIMES A DAY   Ginger, Zingiber officinalis, (GINGER PO) Take 1 tablet by mouth daily.   levothyroxine (SYNTHROID) 50 MCG tablet TAKE 1 TABLET BY MOUTH EVERY MORNING WITH WATER ON AN EMPTY STOMACH. NO OTHER FOODS/MEDS FOR 30 MINS   metoprolol tartrate (LOPRESSOR) 25 MG tablet TAKE 1 TAB BY MOUTH TWICE A DAY AND EXTRA TABLET AS NEEDED FOR BREAKTHROUGH PALPITATIONS AS DIRECTED   NON FORMULARY Take 1 capsule by mouth daily. Tumeric      Daily  (Patient not taking: Reported on 08/22/2021)   omeprazole (PRILOSEC) 20 MG capsule TAKE 1 CAPSULE (20 MG TOTAL) BY MOUTH DAILY. FOR HEARTBURN.   ondansetron (ZOFRAN-ODT) 4 MG disintegrating tablet Take 4 mg by mouth  every 8 (eight) hours as needed.   sertraline (ZOLOFT) 25 MG tablet Take 1 tablet (25 mg total) by mouth daily. For anxiety.   sulfamethoxazole-trimethoprim (BACTRIM DS) 800-160 MG tablet Take 1 tablet by mouth 2 (two) times daily.   traMADol (ULTRAM) 50 MG tablet PLEASE SEE ATTACHED FOR DETAILED DIRECTIONS   tretinoin (RETIN-A) 0.05 % cream Apply 1 application topically daily as needed (as directed per dermatologist).    No facility-administered encounter medications on file as of 10/02/2021.    Allergies  (verified) Other, Fenofibric acid, Nsaids, Statins, Trilipix [choline fenofibrate], and Wasp venom   History: Past Medical History:  Diagnosis Date   Anxiety    prev on prozac   Chest pain    Felt to be noncardiac in the past   Chronic rectal fissure    Colon polyp    Dyslipidemia    Fibromyalgia    GERD (gastroesophageal reflux disease)    Barrett's esophagus   Hiatal hernia    Lumbar spine pain    Complex lumbar spine surgery at [facility], 8841, complicated, MRSA, much of the appliance is removed, patient on chronic antibiotics   Melanoma ([facility])    Mitral regurgitation    a. 2007 Echo Mild MR; b. 10/2016 Echo: EF 55-60%, no rwma, GrI DD, Mild MR.   Osteoarthritis    Scoliosis    Sinusitis    Multiple sinus operations in the past   Statin intolerance    As of 2009, no further attempts to use statins   Thyroid nodule    Past Surgical History:  Procedure Laterality Date   ABDOMINAL HYSTERECTOMY     BACK SURGERY     CHOLECYSTECTOMY     FOOT SURGERY     HERNIA REPAIR     KNEE SURGERY     NASAL SINUS SURGERY     Family History  Problem Relation Age of Onset   GER disease Mother    Dementia Mother    Breast cancer Mother    Heart disease Father        MI at 67   Scoliosis Daughter    Colon cancer Neg Hx    Social History   Socioeconomic History   Marital status: Married    Spouse name: Not on file   Number of children: Not on file   Years of education: Not on file   Highest education level: Not on file  Occupational History   Not on file  Tobacco Use   Smoking status: Never    Passive exposure: Never   Smokeless tobacco: Never  Vaping Use   Vaping Use: Never used  Substance and Sexual Activity   Alcohol use: No   Drug use: No   Sexual activity: Not on file  Other Topics Concern   Not on file  Social History Narrative   From [place]   Married (second [phone]   3 kids, 2 of whom are local   Retired, Camera operator   Social Determinants of  Health   Financial Resource Strain: Low Risk  (10/02/2021)   Overall Financial Resource Strain (CARDIA)    Difficulty of Paying Living Expenses: Not hard at all  Food Insecurity: No Food Insecurity (10/02/2021)   Hunger Vital Sign    Worried About Running Out of Food in the Last Year: Never true    [place] in the Last Year: Never true  Transportation Needs: No Transportation Needs (10/02/2021)   PRAPARE - Transportation  Lack of Transportation (Medical): No    Lack of Transportation (Non-Medical): No  Physical Activity: Insufficiently Active (10/02/2021)   Exercise Vital Sign    Days of Exercise per Week: 2 days    Minutes of Exercise per Session: 60 min  Stress: No Stress Concern Present (10/02/2021)   [place]    Feeling of Stress : Not at all  Social Connections: [place] (10/02/2021)   Social Connection and Isolation Panel [NHANES]    Frequency of Communication with Friends and Family: More than three times a week    Frequency of Social Gatherings with Friends and Family: More than three times a week    Attends Religious Services: More than 4 times per year    Active Member of [employer] or Organizations: Yes    Attends Archivist Meetings: More than 4 times per year    Marital Status: Married     Clinical Intake: How often do you need to have someone help you when you read instructions, pamphlets, or other written materials from your doctor or pharmacy?: 1 - Never  Diabetic?  No    Activities of Daily Living    10/02/2021   11:25 AM  In your present state of health, do you have any difficulty performing the following activities:  Hearing? 0  Vision? 0  Difficulty concentrating or making decisions? 0  Walking or climbing stairs? 0  Dressing or bathing? 0  Doing errands, shopping? 0  Preparing Food and eating ? N  Using the Toilet? N  In the past six months, have you accidently  leaked urine? N  Do you have problems with loss of bowel control? N  Managing your Medications? N  Managing your Finances? N  Housekeeping or managing your Housekeeping? N    Patient Care Team: [name], NP as PCP - General (Internal Medicine) Minna Merritts, MD as PCP - Cardiology (Cardiology)  Indicate any recent Medical Services you may have received from other than [facility] providers in the past year (date may be approximate).     Assessment:   This is a routine wellness examination for [place].  Hearing/Vision screen Hearing Screening - Comments:: No hearing difficulty Vision Screening - Comments:: Wears reading glasses. Followed by [facility]  Dietary issues and exercise activities discussed: Exercise limited by: orthopedic condition(s)   Goals Addressed               This Visit's Progress     Patient stated (pt-stated)        I would like to lose a little weight.       Depression Screen    10/02/2021   11:23 AM 09/19/2020   10:35 AM 03/01/2020    9:59 AM 02/05/2019    9:05 AM 02/03/2018    9:02 AM 01/27/2017   10:13 AM  PHQ 2/9 Scores  PHQ - 2 Score 0 0 0 0 0 0  PHQ- 9 Score  0 0 0  0    Fall Risk    10/02/2021   11:26 AM 09/26/2020    9:32 AM 09/19/2020   10:33 AM 03/01/2020    9:59 AM 02/05/2019    9:05 AM  Fall Risk   Falls in the past year? 0 '1 1 1 '$ 0  Number falls in past yr: 0 1 0 0 0  Injury with Fall? 0 '1 1 1 '$ 0  Risk for fall due to : No Fall  Risks  Medication side effect Impaired balance/gait Medication side effect  Follow up   Falls evaluation completed;Falls prevention discussed  Falls evaluation completed;Falls prevention discussed    FALL RISK PREVENTION PERTAINING TO THE HOME:  Any stairs in or around the home? Yes  If so, are there any without handrails? No  Home free of loose throw rugs in walkways, pet beds, electrical cords, etc? Yes  Adequate lighting in your home to reduce risk of falls? Yes   ASSISTIVE DEVICES  UTILIZED TO PREVENT FALLS:  Life alert? No  Use of a cane, walker or w/c? Yes  Grab bars in the bathroom? Yes  Shower chair or bench in shower? Yes  Elevated toilet seat or a handicapped toilet? No   TIMED UP AND GO:  Was the test performed? No .Audio Visit   Cognitive Function:    09/19/2020   10:39 AM 02/05/2019    9:07 AM 01/27/2017   10:14 AM  MMSE - Mini Mental State Exam  Not completed: Refused    Orientation to time  5 5  Orientation to Place  5 5  Registration  3 3  Attention/ Calculation  5 0  Recall  3 3  Language- name 2 objects   0  Language- repeat  1 1  Language- follow 3 step command   3  Language- read & follow direction   0  Write a sentence   0  Copy design   0  Total score   20        10/02/2021   11:28 AM  6CIT Screen  What Year? 0 points  What month? 0 points  What time? 0 points  Count back from 20 0 points  Months in reverse 0 points  Repeat phrase 0 points  Total Score 0 points    Immunizations Immunization History  Administered Date(s) Administered   Fluad Quad(high Dose 65+) 03/01/2020   Influenza Split 02/03/2012   Influenza,inj,Quad PF,6+ Mos 02/20/2017, 02/03/2018, 02/10/2019   Influenza-Unspecified 02/02/2016, 01/13/2017, 02/02/2018   Moderna Covid-19 Vaccine Bivalent Booster 65yr & up 01/23/2021   Moderna Sars-Covid-2 Vaccination 04/26/2019, 05/25/2019, 02/11/2020, 08/01/2020   Pneumococcal Conjugate-13 02/10/2019   Pneumococcal Polysaccharide-23 02/03/2018   Td 02/03/2016   Tdap 10/22/2017    TDAP status: Up to date  Flu Vaccine status: Up to date  Pneumococcal vaccine status: Up to date  Covid-19 vaccine status: Completed vaccines  Qualifies for Shingles Vaccine? Yes   Zostavax completed No   Shingrix Completed?: No.    Education has been provided regarding the importance of this vaccine. Patient has been advised to call insurance company to determine out of pocket expense if they have not yet received this  vaccine. Advised may also receive vaccine at local pharmacy or Health Dept. Verbalized acceptance and understanding.  Screening Tests Health Maintenance  Topic Date Due   Zoster Vaccines- Shingrix (1 of 2) 01/02/2022 (Originally 05/01/1954)   INFLUENZA VACCINE  11/13/2021   TETANUS/TDAP  10/23/2027   Pneumonia Vaccine 86 Years old  Completed   DEXA SCAN  Completed   COVID-19 Vaccine  Completed   HPV VACCINES  Aged Out    Health Maintenance  There are no preventive care reminders to display for this patient.   Colorectal cancer screening: No longer required.   Mammogram status: No longer required due to Age.  Bone Density status: Completed 01/15/21. Results reflect: Bone density results: OSTEOPENIA. Repeat every   years.  Lung Cancer Screening: (Low Dose CT Chest recommended  if Age 32-80 years, 62 pack-year currently smoking OR have quit w/in 15years.) does not qualify.     Additional Screening:  Hepatitis C Screening: does not qualify; Completed   Vision Screening: Recommended annual ophthalmology exams for early detection of glaucoma and other disorders of the eye. Is the patient up to date with their annual eye exam?  Yes  Who is the provider or what is the name of the office in which the patient attends annual eye exams? [facility] If pt is not established with a provider, would they like to be referred to a provider to establish care? No .   Dental Screening: Recommended annual dental exams for proper oral hygiene  Community Resource Referral / Chronic Care Management:  CRR required this visit?  No   CCM required this visit?  No      Plan:     I have personally reviewed and noted the following in the patient's chart:   Medical and social history Use of alcohol, tobacco or illicit drugs  Current medications and supplements including opioid prescriptions.  Functional ability and status Nutritional status Physical activity Advanced directives List of  other physicians Hospitalizations, surgeries, and ER visits in previous 12 months Vitals Screenings to include cognitive, depression, and falls Referrals and appointments  In addition, I have reviewed and discussed with patient certain preventive protocols, quality metrics, and best practice recommendations. A written personalized care plan for preventive services as well as general preventive health recommendations were provided to patient.     Criselda Peaches, LPN   [id number]   Nurse Notes: None

## 2021-10-02 NOTE — Patient Instructions (Addendum)
[name] [name] , Thank you for taking time to come for your Medicare Wellness Visit. I appreciate your ongoing commitment to your health goals. Please review the following plan we discussed and let me know if I can assist you in the future.   These are the goals we discussed:  Goals       Health management      Starting 01/27/2017, I will continue to exercise for 60 min 3 days per week, to drink 5-6 glasses of water daily, and to take medications as prescribed.       Patient Stated      02/05/2019, I will exercise more daily and go to the gym.       Patient Stated      09/19/2020, I will continue to ride my recumbent bike 3 days a week for 30 minutes.       Patient stated (pt-stated)      I would like to lose a little weight.        This is a list of the screening recommended for you and due dates:  Health Maintenance  Topic Date Due   Zoster (Shingles) Vaccine (1 of 2) 01/02/2022*   Flu Shot  11/13/2021   Tetanus Vaccine  10/23/2027   Pneumonia Vaccine  Completed   DEXA scan (bone density measurement)  Completed   COVID-19 Vaccine  Completed   HPV Vaccine  Aged Out  *Topic was postponed. The date shown is not the original due date.    Opioid Pain Medicine Management Opioids are powerful medicines that are used to treat moderate to severe pain. When used for short periods of time, they can help you to: Sleep better. Do better in physical or occupational therapy. Feel better in the first few days after an injury. Recover from surgery. Opioids should be taken with the supervision of a trained health care provider. They should be taken for the shortest period of time possible. This is because opioids can be addictive, and the longer you take opioids, the greater your risk of addiction. This addiction can also be called opioid use disorder. What are the risks? Using opioid pain medicines for longer than 3 days increases your risk of side effects. Side effects  include: Constipation. Nausea and vomiting. Breathing difficulties (respiratory depression). Drowsiness. Confusion. Opioid use disorder. Itching. Taking opioid pain medicine for a long period of time can affect your ability to do daily tasks. It also puts you at risk for: Motor vehicle crashes. Depression. Suicide. Heart attack. Overdose, which can be life-threatening. What is a pain treatment plan? A pain treatment plan is an agreement between you and your health care provider. Pain is unique to each person, and treatments vary depending on your condition. To manage your pain, you and your health care provider need to work together. To help you do this: Discuss the goals of your treatment, including how much pain you might expect to have and how you will manage the pain. Review the risks and benefits of taking opioid medicines. Remember that a good treatment plan uses more than one approach and minimizes the chance of side effects. Be honest about the amount of medicines you take and about any drug or alcohol use. Get pain medicine prescriptions from only one health care provider. Pain can be managed with many types of alternative treatments. Ask your health care provider to refer you to one or more specialists who can help you manage pain through: Physical or occupational therapy. Counseling (cognitive  behavioral therapy). Good nutrition. Biofeedback. Massage. Meditation. Non-opioid medicine. Following a gentle exercise program. How to use opioid pain medicine Taking medicine Take your pain medicine exactly as told by your health care provider. Take it only when you need it. If your pain gets less severe, you may take less than your prescribed dose if your health care provider approves. If you are not having pain, do nottake pain medicine unless your health care provider tells you to take it. If your pain is severe, do nottry to treat it yourself by taking more pills than  instructed on your prescription. Contact your health care provider for help. Write down the times when you take your pain medicine. It is easy to become confused while on pain medicine. Writing the time can help you avoid overdose. Take other over-the-counter or prescription medicines only as told by your health care provider. Keeping yourself and others safe  While you are taking opioid pain medicine: Do not drive, use machinery, or power tools. Do not sign legal documents. Do not drink alcohol. Do not take sleeping pills. Do not supervise children by yourself. Do not do activities that require climbing or being in high places. Do not go to a lake, river, ocean, spa, or swimming pool. Do not share your pain medicine with anyone. Keep pain medicine in a locked cabinet or in a secure area where pets and children cannot reach it. Stopping your use of opioids If you have been taking opioid medicine for more than a few weeks, you may need to slowly decrease (taper) how much you take until you stop completely. Tapering your use of opioids can decrease your risk of symptoms of withdrawal, such as: Pain and cramping in the abdomen. Nausea. Sweating. Sleepiness. Restlessness. Uncontrollable shaking (tremors). Cravings for the medicine. Do not attempt to taper your use of opioids on your own. Talk with your health care provider about how to do this. Your health care provider may prescribe a step-down schedule based on how much medicine you are taking and how long you have been taking it. Getting rid of leftover pills Do not save any leftover pills. Get rid of leftover pills safely by: Taking the medicine to a prescription take-back program. This is usually offered by the county or law enforcement. Bringing them to a pharmacy that has a drug disposal container. Flushing them down the toilet. Check the label or package insert of your medicine to see whether this is safe to do. Throwing them out in  the trash. Check the label or package insert of your medicine to see whether this is safe to do. If it is safe to throw it out, remove the medicine from the original container, put it into a sealable bag or container, and mix it with used coffee grounds, food scraps, dirt, or cat litter before putting it in the trash. Follow these instructions at home: Activity Do exercises as told by your health care provider. Avoid activities that make your pain worse. Return to your normal activities as told by your health care provider. Ask your health care provider what activities are safe for you. General instructions You may need to take these actions to prevent or treat constipation: Drink enough fluid to keep your urine pale yellow. Take over-the-counter or prescription medicines. Eat foods that are high in fiber, such as beans, whole grains, and fresh fruits and vegetables. Limit foods that are high in fat and processed sugars, such as fried or sweet foods. Keep all follow-up  visits. This is important. Where to find support If you have been taking opioids for a long time, you may benefit from receiving support for quitting from a local support group or counselor. Ask your health care provider for a referral to these resources in your area. Where to find more information Centers for Disease Control and Prevention (CDC): http://www.wolf.info/ U.[place]. Food and Drug Administration (FDA): [place] Get help right away if: You may have taken too much of an opioid (overdosed). Common symptoms of an overdose: Your breathing is slower or more shallow than normal. You have a very slow heartbeat (pulse). You have slurred speech. You have nausea and vomiting. Your pupils become very small. You have other potential symptoms: You are very confused. You faint or feel like you will faint. You have cold, clammy skin. You have blue lips or fingernails. You have thoughts of harming yourself or harming others. These  symptoms may represent a serious problem that is an emergency. Do not wait to see if the symptoms will go away. Get medical help right away. Call your local emergency services (911 in the [place] Do not drive yourself to the hospital.  If you ever feel like you may hurt yourself or others, or have thoughts about taking your own life, get help right away. Go to your nearest emergency department or: Call your local emergency services (911 in the [place] Call the [facility] 814-524-5128 in the [place] Call a suicide crisis helpline, such as the [place] at 681-483-2028 or 988 in the [place]. This is open 24 hours a day in the [place] Text the Crisis Text Line at (508)321-2111 (in the [place].). Summary Opioid medicines can help you manage moderate to severe pain for a short period of time. A pain treatment plan is an agreement between you and your health care provider. Discuss the goals of your treatment, including how much pain you might expect to have and how you will manage the pain. If you think that you or someone else may have taken too much of an opioid, get medical help right away. This information is not intended to replace advice given to you by your health care provider. Make sure you discuss any questions you have with your health care provider. Document Revised: 10/25/2020 Document Reviewed: 07/12/2020 Elsevier Patient Education  [place] directives: No  Conditions/risks identified: None  Next appointment: Follow up in one year for your annual wellness visit     Preventive Care 65 Years and Older, Female Preventive care refers to lifestyle choices and visits with your health care provider that can promote health and wellness. What does preventive care include? A yearly physical exam. This is also called an annual well check. Dental exams once or twice a year. Routine eye exams. Ask your health care provider how often you  should have your eyes checked. Personal lifestyle choices, including: Daily care of your teeth and gums. Regular physical activity. Eating a healthy diet. Avoiding tobacco and drug use. Limiting alcohol use. Practicing safe sex. Taking low-dose aspirin every day. Taking vitamin and mineral supplements as recommended by your health care provider. What happens during an annual well check? The services and screenings done by your health care provider during your annual well check will depend on your age, overall health, lifestyle risk factors, and family history of disease. Counseling  Your health care provider may ask you questions about your: Alcohol use. Tobacco use. Drug use. Emotional well-being.  Home and relationship well-being. Sexual activity. Eating habits. History of falls. Memory and ability to understand (cognition). Work and work Statistician. Reproductive health. Screening  You may have the following tests or measurements: Height, weight, and BMI. Blood pressure. Lipid and cholesterol levels. These may be checked every 5 years, or more frequently if you are over 75 years old. Skin check. Lung cancer screening. You may have this screening every year starting at age 75 if you have a 30-pack-year history of smoking and currently smoke or have quit within the past 15 years. Fecal occult blood test (FOBT) of the stool. You may have this test every year starting at age 41. Flexible sigmoidoscopy or colonoscopy. You may have a sigmoidoscopy every 5 years or a colonoscopy every 10 years starting at age 1. Hepatitis C blood test. Hepatitis B blood test. Sexually transmitted disease (STD) testing. Diabetes screening. This is done by checking your blood sugar (glucose) after you have not eaten for a while (fasting). You may have this done every 1-3 years. Bone density scan. This is done to screen for osteoporosis. You may have this done starting at age 64. Mammogram. This may  be done every 1-2 years. Talk to your health care provider about how often you should have regular mammograms. Talk with your health care provider about your test results, treatment options, and if necessary, the need for more tests. Vaccines  Your health care provider may recommend certain vaccines, such as: Influenza vaccine. This is recommended every year. Tetanus, diphtheria, and acellular pertussis (Tdap, Td) vaccine. You may need a Td booster every 10 years. Zoster vaccine. You may need this after age 59. Pneumococcal 13-valent conjugate (PCV13) vaccine. One dose is recommended after age 28. Pneumococcal polysaccharide (PPSV23) vaccine. One dose is recommended after age 17. Talk to your health care provider about which screenings and vaccines you need and how often you need them. This information is not intended to replace advice given to you by your health care provider. Make sure you discuss any questions you have with your health care provider. Document Released: 04/28/2015 Document Revised: 12/20/2015 Document Reviewed: 01/31/2015 Elsevier Interactive Patient Education  2017 [place] Prevention in the Home Falls can cause injuries. They can happen to people of all ages. There are many things you can do to make your home safe and to help prevent falls. What can I do on the outside of my home? Regularly fix the edges of walkways and driveways and fix any cracks. Remove anything that might make you trip as you walk through a door, such as a raised step or threshold. Trim any bushes or trees on the path to your home. Use bright outdoor lighting. Clear any walking paths of anything that might make someone trip, such as rocks or tools. Regularly check to see if handrails are loose or broken. Make sure that both sides of any steps have handrails. Any raised decks and porches should have guardrails on the edges. Have any leaves, snow, or ice cleared regularly. Use sand or salt  on walking paths during winter. Clean up any spills in your garage right away. This includes oil or grease spills. What can I do in the bathroom? Use night lights. Install grab bars by the toilet and in the tub and shower. Do not use towel bars as grab bars. Use non-skid mats or decals in the tub or shower. If you need to sit down in the shower, use a plastic, non-slip stool. Keep the  floor dry. Clean up any water that spills on the floor as soon as it happens. Remove soap buildup in the tub or shower regularly. Attach bath mats securely with double-sided non-slip rug tape. Do not have throw rugs and other things on the floor that can make you trip. What can I do in the bedroom? Use night lights. Make sure that you have a light by your bed that is easy to reach. Do not use any sheets or blankets that are too big for your bed. They should not hang down onto the floor. Have a firm chair that has side arms. You can use this for support while you get dressed. Do not have throw rugs and other things on the floor that can make you trip. What can I do in the kitchen? Clean up any spills right away. Avoid walking on wet floors. Keep items that you use a lot in easy-to-reach places. If you need to reach something above you, use a strong step stool that has a grab bar. Keep electrical cords out of the way. Do not use floor polish or wax that makes floors slippery. If you must use wax, use non-skid floor wax. Do not have throw rugs and other things on the floor that can make you trip. What can I do with my stairs? Do not leave any items on the stairs. Make sure that there are handrails on both sides of the stairs and use them. Fix handrails that are broken or loose. Make sure that handrails are as long as the stairways. Check any carpeting to make sure that it is firmly attached to the stairs. Fix any carpet that is loose or worn. Avoid having throw rugs at the top or bottom of the stairs. If you  do have throw rugs, attach them to the floor with carpet tape. Make sure that you have a light switch at the top of the stairs and the bottom of the stairs. If you do not have them, ask someone to add them for you. What else can I do to help prevent falls? Wear shoes that: Do not have high heels. Have rubber bottoms. Are comfortable and fit you well. Are closed at the toe. Do not wear sandals. If you use a stepladder: Make sure that it is fully opened. Do not climb a closed stepladder. Make sure that both sides of the stepladder are locked into place. Ask someone to hold it for you, if possible. Clearly mark and make sure that you can see: Any grab bars or handrails. First and last steps. Where the edge of each step is. Use tools that help you move around (mobility aids) if they are needed. These include: Canes. Walkers. Scooters. Crutches. Turn on the lights when you go into a dark area. Replace any light bulbs as soon as they burn out. Set up your furniture so you have a clear path. Avoid moving your furniture around. If any of your floors are uneven, fix them. If there are any pets around you, be aware of where they are. Review your medicines with your doctor. Some medicines can make you feel dizzy. This can increase your chance of falling. Ask your doctor what other things that you can do to help prevent falls. This information is not intended to replace advice given to you by your health care provider. Make sure you discuss any questions you have with your health care provider. Document Released: 01/26/2009 Document Revised: 09/07/2015 Document Reviewed: 05/06/2014 Elsevier Interactive Patient  Education  2017 [employer].

## 2021-10-04 DIAGNOSIS — R262 Difficulty in walking, not elsewhere classified: Secondary | ICD-10-CM | POA: Diagnosis not present

## 2021-10-04 DIAGNOSIS — M6281 Muscle weakness (generalized): Secondary | ICD-10-CM | POA: Diagnosis not present

## 2021-10-04 DIAGNOSIS — M25661 Stiffness of right knee, not elsewhere classified: Secondary | ICD-10-CM | POA: Diagnosis not present

## 2021-10-04 DIAGNOSIS — M25561 Pain in right knee: Secondary | ICD-10-CM | POA: Diagnosis not present

## 2021-10-07 ENCOUNTER — Other Ambulatory Visit: Payer: Self-pay | Admitting: Primary Care

## 2021-10-07 DIAGNOSIS — K219 Gastro-esophageal reflux disease without esophagitis: Secondary | ICD-10-CM

## 2021-10-09 DIAGNOSIS — M25661 Stiffness of right knee, not elsewhere classified: Secondary | ICD-10-CM | POA: Diagnosis not present

## 2021-10-09 DIAGNOSIS — M6281 Muscle weakness (generalized): Secondary | ICD-10-CM | POA: Diagnosis not present

## 2021-10-09 DIAGNOSIS — R262 Difficulty in walking, not elsewhere classified: Secondary | ICD-10-CM | POA: Diagnosis not present

## 2021-10-09 DIAGNOSIS — M25561 Pain in right knee: Secondary | ICD-10-CM | POA: Diagnosis not present

## 2021-10-10 ENCOUNTER — Ambulatory Visit (INDEPENDENT_AMBULATORY_CARE_PROVIDER_SITE_OTHER): Payer: Medicare Other | Admitting: Orthopaedic Surgery

## 2021-10-10 ENCOUNTER — Other Ambulatory Visit (INDEPENDENT_AMBULATORY_CARE_PROVIDER_SITE_OTHER): Payer: Medicare Other

## 2021-10-10 DIAGNOSIS — E039 Hypothyroidism, unspecified: Secondary | ICD-10-CM | POA: Diagnosis not present

## 2021-10-10 DIAGNOSIS — M25562 Pain in left knee: Secondary | ICD-10-CM | POA: Diagnosis not present

## 2021-10-10 DIAGNOSIS — D649 Anemia, unspecified: Secondary | ICD-10-CM | POA: Diagnosis not present

## 2021-10-10 DIAGNOSIS — G8929 Other chronic pain: Secondary | ICD-10-CM

## 2021-10-10 DIAGNOSIS — E785 Hyperlipidemia, unspecified: Secondary | ICD-10-CM

## 2021-10-10 LAB — LIPID PANEL
Cholesterol: 157 mg/dL (ref 0–200)
HDL: 55.9 mg/dL (ref >39.00)
LDL Cholesterol: 75 mg/dL (ref 0–99)
NonHDL: 100.66
Total CHOL/HDL Ratio: 3
Triglycerides: 128 mg/dL (ref 0.0–149.0)
VLDL: 25.6 mg/dL (ref 0.0–40.0)

## 2021-10-10 LAB — IBC + FERRITIN
Ferritin: 29.3 ng/mL (ref 10.0–291.0)
Iron: 52 ug/dL (ref 42–145)
Saturation Ratios: 8.7 % — ABNORMAL LOW (ref 20.0–50.0)
TIBC: 599.2 ug/dL — ABNORMAL HIGH (ref 250.0–450.0)
Transferrin: 428 mg/dL — ABNORMAL HIGH (ref 212.0–360.0)

## 2021-10-10 LAB — BASIC METABOLIC PANEL
BUN: 23 mg/dL (ref 6–23)
CO2: 26 mEq/L (ref 19–32)
Calcium: 9.1 mg/dL (ref 8.4–10.5)
Chloride: 104 mEq/L (ref 96–112)
Creatinine, Ser: 0.97 mg/dL (ref 0.40–1.20)
GFR: 53.68 mL/min — ABNORMAL LOW (ref >60.00)
Glucose, Bld: 101 mg/dL — ABNORMAL HIGH (ref 70–99)
Potassium: 4.2 mEq/L (ref 3.5–5.1)
Sodium: 140 mEq/L (ref 135–145)

## 2021-10-10 LAB — TSH: TSH: 1.92 u[IU]/mL (ref 0.35–5.50)

## 2021-10-10 LAB — CBC
HCT: 32.8 % — ABNORMAL LOW (ref 36.0–46.0)
Hemoglobin: 10.7 g/dL — ABNORMAL LOW (ref 12.0–15.0)
MCHC: 32.5 g/dL (ref 30.0–36.0)
MCV: 90.1 fl (ref 78.0–100.0)
Platelets: 180 10*3/uL (ref 150.0–400.0)
RBC: 3.64 Mil/uL — ABNORMAL LOW (ref 3.87–5.11)
RDW: 14.5 % (ref 11.5–15.5)
WBC: 3.8 10*3/uL — ABNORMAL LOW (ref 4.0–10.5)

## 2021-10-10 MED ORDER — LIDOCAINE HCL 1 % IJ SOLN
3.0000 mL | INTRAMUSCULAR | Status: AC | PRN
  Administered 2021-10-10: 3 mL

## 2021-10-10 MED ORDER — METHYLPREDNISOLONE ACETATE 40 MG/ML IJ SUSP
40.0000 mg | INTRAMUSCULAR | Status: AC | PRN
  Administered 2021-10-10: 40 mg via INTRA_ARTICULAR

## 2021-10-10 NOTE — Progress Notes (Signed)
Office Visit Note   Patient: [name] [name]           Date of Birth: 1936/02/24           MRN: [id number] Visit Date: 10/10/2021              Requested by: [name], NP [place],  [place] [address] PCP: [name], NP   Assessment & Plan: Visit Diagnoses:  1. Chronic pain of left knee     Plan: Per the patient's request I did provide a steroid injection in her left knee today which she tolerated well.  All question concerns were answered and addressed.  Follow-up is as needed.  She knows to wait at least 3 to 4 months between steroid injections.  Follow-Up Instructions: Return if symptoms worsen or fail to improve.   Orders:  Orders Placed This Encounter  Procedures   Large Joint Inj   No orders of the defined types were placed in this encounter.     Procedures: Large Joint Inj: L knee on 10/10/2021 2:09 PM Indications: diagnostic evaluation and pain Details: 22 G 1.5 in needle, superolateral approach  Arthrogram: No  Medications: 3 mL lidocaine 1 %; 40 mg methylPREDNISolone acetate 40 MG/ML Outcome: tolerated well, no immediate complications Procedure, treatment alternatives, risks and benefits explained, specific risks discussed. Consent was given by the patient. Immediately prior to procedure a time out was called to verify the correct patient, procedure, equipment, support staff and site/side marked as required. Patient was prepped and draped in the usual sterile fashion.       Clinical Data: No additional findings.   Subjective: Chief Complaint  Patient presents with   Left Knee - Pain  [name] [name] comes in today requesting steroid injection of the left knee.  She had a successful right total knee arthroplasty in April of this year by one of my colleagues in [place] Dr. Ennis Forts.  Unfortunately she did have a blood clot in the pulmonary embolus and is on blood thinning medications.  However, her right operative knee is doing well.   She does have left knee pain and well-documented arthritis in left knee.  It is not nearly as bad as the right knee.  She is requesting a steroid injection of the left knee today and we agree with this as well.  She is ambulating using cane.  She tries to be as active as she can at 86 years old.  HPI  Review of Systems Today she denies any fever, chills, shortness of breath  Objective: Vital Signs: There were no vitals taken for this visit.  Physical Exam She is alert and orient x3 and in no acute distress Ortho Exam Her right operative knee looks great.  There is swelling to be expected but has good range of motion.  Her left knee that is the painful knee today shows no effusion and good alignment with good range of motion but global tenderness. Specialty Comments:  No specialty comments available.  Imaging: No results found.   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[place]) 02/10/2019   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   Chronic left shoulder pain 01/21/2018   Chronic pain of left knee 01/21/2018   Chronic pain of right knee 01/21/2018   Osteoarthritis of glenohumeral joint, left 01/21/2018   Decreased renal function 01/29/2017   Anemia 01/29/2017   Preventative health care 01/29/2017   Other fatigue 09/03/2016   Primary insomnia 09/03/2016   Primary osteoarthritis of both knees 09/03/2016   DJD (degenerative joint disease), cervical  09/03/2016   MRSA infection 07/31/2016   Chronic rhinitis 07/24/2016   Sinusitis chronic, sphenoidal 07/24/2016   Osteopenia 03/04/2016   Palpitations 09/19/2014   Thyroid nodule, uninodular 09/18/2011   Hypothyroid 08/19/2011   Postmenopausal HRT (hormone replacement therapy) 08/13/2011   Endocarditis 04/24/2011   Mitral regurgitation    Lumbar spine pain    Dyslipidemia    Chest pain    Statin intolerance    Mitral valve prolapse    Ejection fraction    Fibromyalgia    ADENOMATOUS COLONIC POLYP 08/15/2007   GERD 08/15/2007   BARRETTS ESOPHAGUS 08/15/2007   Diaphragmatic hernia 08/15/2007   Osteoarthritis 08/15/2007   CATARACT EXTRACTION, HX OF 08/15/2007   Hiatal hernia 08/15/2007   Past Medical History:  Diagnosis Date   Anxiety    prev on prozac   Chest pain    Felt to be noncardiac in the past   Chronic rectal fissure    Colon polyp    Dyslipidemia    Fibromyalgia    GERD (gastroesophageal reflux disease)    Barrett's esophagus   Hiatal hernia    Lumbar spine pain    Complex lumbar spine surgery at [facility], [id number], complicated, MRSA, much of the appliance is removed, patient on chronic antibiotics   Melanoma ([place])    Mitral regurgitation    a. 2007 Echo Mild MR; b. 10/2016 Echo: EF 55-60%, no rwma, GrI DD, Mild MR.   Osteoarthritis    Scoliosis    Sinusitis    Multiple sinus operations in the past   Statin intolerance    As of 2009, no further attempts to use statins   Thyroid nodule     Family History  Problem Relation Age of Onset   GER disease Mother    Dementia Mother    Breast cancer Mother    Heart disease Father        MI at 3   Scoliosis Daughter    Colon cancer Neg Hx     Past Surgical History:  Procedure Laterality Date   ABDOMINAL HYSTERECTOMY     BACK SURGERY     CHOLECYSTECTOMY     FOOT SURGERY     HERNIA REPAIR     KNEE SURGERY     NASAL SINUS SURGERY     Social History   Occupational History   Not on file  Tobacco Use    Smoking status: Never    Passive exposure: Never   Smokeless tobacco: Never  Vaping Use   Vaping Use: Never used  Substance and Sexual Activity   Alcohol use: No   Drug use: No   Sexual activity: Not on file

## 2021-10-11 ENCOUNTER — Other Ambulatory Visit: Payer: Self-pay | Admitting: Physician Assistant

## 2021-10-11 ENCOUNTER — Other Ambulatory Visit: Payer: Self-pay | Admitting: Primary Care

## 2021-10-11 DIAGNOSIS — E785 Hyperlipidemia, unspecified: Secondary | ICD-10-CM

## 2021-10-11 DIAGNOSIS — E039 Hypothyroidism, unspecified: Secondary | ICD-10-CM

## 2021-10-11 DIAGNOSIS — I2699 Other pulmonary embolism without acute cor pulmonale: Secondary | ICD-10-CM

## 2021-10-11 DIAGNOSIS — F411 Generalized anxiety disorder: Secondary | ICD-10-CM

## 2021-10-17 ENCOUNTER — Ambulatory Visit (INDEPENDENT_AMBULATORY_CARE_PROVIDER_SITE_OTHER): Payer: Medicare Other | Admitting: Primary Care

## 2021-10-17 ENCOUNTER — Encounter: Payer: Self-pay | Admitting: Primary Care

## 2021-10-17 VITALS — BP 110/74 | HR 82 | Temp 98.6°F | Ht <= 58 in | Wt 150.0 lb

## 2021-10-17 DIAGNOSIS — N289 Disorder of kidney and ureter, unspecified: Secondary | ICD-10-CM

## 2021-10-17 DIAGNOSIS — R002 Palpitations: Secondary | ICD-10-CM

## 2021-10-17 DIAGNOSIS — R262 Difficulty in walking, not elsewhere classified: Secondary | ICD-10-CM | POA: Diagnosis not present

## 2021-10-17 DIAGNOSIS — R519 Headache, unspecified: Secondary | ICD-10-CM

## 2021-10-17 DIAGNOSIS — Z792 Long term (current) use of antibiotics: Secondary | ICD-10-CM

## 2021-10-17 DIAGNOSIS — Z96651 Presence of right artificial knee joint: Secondary | ICD-10-CM

## 2021-10-17 DIAGNOSIS — D649 Anemia, unspecified: Secondary | ICD-10-CM

## 2021-10-17 DIAGNOSIS — E039 Hypothyroidism, unspecified: Secondary | ICD-10-CM | POA: Diagnosis not present

## 2021-10-17 DIAGNOSIS — F411 Generalized anxiety disorder: Secondary | ICD-10-CM

## 2021-10-17 DIAGNOSIS — M25661 Stiffness of right knee, not elsewhere classified: Secondary | ICD-10-CM | POA: Diagnosis not present

## 2021-10-17 DIAGNOSIS — I2699 Other pulmonary embolism without acute cor pulmonale: Secondary | ICD-10-CM

## 2021-10-17 DIAGNOSIS — M25561 Pain in right knee: Secondary | ICD-10-CM | POA: Diagnosis not present

## 2021-10-17 DIAGNOSIS — E041 Nontoxic single thyroid nodule: Secondary | ICD-10-CM

## 2021-10-17 DIAGNOSIS — K219 Gastro-esophageal reflux disease without esophagitis: Secondary | ICD-10-CM | POA: Diagnosis not present

## 2021-10-17 DIAGNOSIS — M797 Fibromyalgia: Secondary | ICD-10-CM

## 2021-10-17 DIAGNOSIS — M6281 Muscle weakness (generalized): Secondary | ICD-10-CM | POA: Diagnosis not present

## 2021-10-17 DIAGNOSIS — Z0000 Encounter for general adult medical examination without abnormal findings: Secondary | ICD-10-CM

## 2021-10-17 DIAGNOSIS — E785 Hyperlipidemia, unspecified: Secondary | ICD-10-CM

## 2021-10-17 DIAGNOSIS — Z8614 Personal history of Methicillin resistant Staphylococcus aureus infection: Secondary | ICD-10-CM

## 2021-10-17 NOTE — Assessment & Plan Note (Signed)
Statin intolerant.   Continue fenofibrate 134 mg daily. Continue Fish Oil.

## 2021-10-17 NOTE — Assessment & Plan Note (Signed)
Due for Shingrix vaccine, she will obtain this from the pharmacy. Other vaccines UTD. Mammogram UTD. Bone density scan UTD.  Encouraged regular walking/activity.  Exam today stable. Labs reviewed.

## 2021-10-17 NOTE — Assessment & Plan Note (Signed)
Patient was notified by Dr. Harlow Asa that further thyroid US was not needed.

## 2021-10-17 NOTE — Assessment & Plan Note (Signed)
Stable. Recent BMP reviewed.

## 2021-10-17 NOTE — Progress Notes (Signed)
Subjective:    Patient ID: [name] [name], female    DOB: 02/01/1936, 86 y.o.   MRN: [id number]  HPI  [name] [name] is a very pleasant 86 y.o. female who presents today for complete physical and follow up of chronic conditions.  She would also like to discus chronic headaches. Chronic headaches which occur to the frontal lobes, behind her eyes, occipital lobes. Also with long history of occular headaches with disturbed vision with jagged/wavy colorful lines. Over the last 6-12 months she's noticed increased frequency of headaches and visual symptoms occurring once every 2-3 weeks.   She was once treated by me for headache prevention, prescribed Topamax 25 mg but this caused diarrhea.   BP Readings from Last 3 Encounters:  10/17/21 110/74  08/22/21 110/82  06/27/21 119/74     Immunizations: -Tetanus: 2019 -Influenza: Completed last season  -Covid-19: 5 vaccines -Shingles: Has never completed -Pneumonia: Prevnar 13 in 2020, pneumovax in 2019  Diet: [place].  Exercise: No regular exercise.  Eye exam: Completes annually  Dental exam: Completes semi-annually   Mammogram: Completed in November 2022  Colonoscopy: N/A given age Dexa: Completed in October 2022      Review of Systems  Constitutional:  Negative for unexpected weight change.  HENT:  Negative for rhinorrhea.   Eyes:  Positive for visual disturbance.  Respiratory:  Negative for cough and shortness of breath.   Cardiovascular:  Negative for chest pain.  Gastrointestinal:  Negative for constipation and diarrhea.  Genitourinary:  Negative for difficulty urinating.  Musculoskeletal:  Positive for arthralgias and joint swelling.  Skin:  Negative for rash.  Allergic/Immunologic: Negative for environmental allergies.  Neurological:  Positive for headaches. Negative for dizziness.  Psychiatric/Behavioral:  The patient is not nervous/anxious.          Past Medical History:  Diagnosis Date   Anxiety    prev on  prozac   Chest pain    Felt to be noncardiac in the past   Chronic rectal fissure    Colon polyp    Dyslipidemia    Fibromyalgia    GERD (gastroesophageal reflux disease)    Barrett's esophagus   Hiatal hernia    Lumbar spine pain    Complex lumbar spine surgery at [facility], 7893, complicated, MRSA, much of the appliance is removed, patient on chronic antibiotics   Melanoma ([place])    Mitral regurgitation    a. 2007 Echo Mild MR; b. 10/2016 Echo: EF 55-60%, no rwma, GrI DD, Mild MR.   Osteoarthritis    Scoliosis    Sinusitis    Multiple sinus operations in the past   Statin intolerance    As of 2009, no further attempts to use statins   Thyroid nodule     Social History   Socioeconomic History   Marital status: Married    Spouse name: Not on file   Number of children: Not on file   Years of education: Not on file   Highest education level: Not on file  Occupational History   Not on file  Tobacco Use   Smoking status: Never    Passive exposure: Never   Smokeless tobacco: Never  Vaping Use   Vaping Use: Never used  Substance and Sexual Activity   Alcohol use: No   Drug use: No   Sexual activity: Not on file  Other Topics Concern   Not on file  Social History Narrative   From [place]   Married (second [phone]  3 kids, 2 of whom are local   Retired, Camera operator   Social Determinants of Health   Financial Resource Strain: [place]  (10/02/2021)   Overall Financial Resource Strain (CARDIA)    Difficulty of Paying Living Expenses: Not hard at all  Food Insecurity: No Food Insecurity (10/02/2021)   Hunger Vital Sign    Worried About Running Out of Food in the Last Year: Never true    [place] in the Last Year: Never true  Transportation Needs: No Transportation Needs (10/02/2021)   PRAPARE - Hydrologist (Medical): No    Lack of Transportation (Non-Medical): No  Physical Activity: Insufficiently Active (10/02/2021)    Exercise Vital Sign    Days of Exercise per Week: 2 days    Minutes of Exercise per Session: 60 min  Stress: No Stress Concern Present (10/02/2021)   [place]    Feeling of Stress : Not at all  Social Connections: [place] (10/02/2021)   Social Connection and Isolation Panel [NHANES]    Frequency of Communication with Friends and Family: More than three times a week    Frequency of Social Gatherings with Friends and Family: More than three times a week    Attends Religious Services: More than 4 times per year    Active Member of [employer] or Organizations: Yes    Attends Music therapist: More than 4 times per year    Marital Status: Married  Human resources officer Violence: Not At Risk (10/02/2021)   Humiliation, Afraid, Rape, and Kick questionnaire    Fear of Current or Ex-Partner: No    Emotionally Abused: No    Physically Abused: No    Sexually Abused: No    Past Surgical History:  Procedure Laterality Date   ABDOMINAL HYSTERECTOMY     BACK SURGERY     CHOLECYSTECTOMY     FOOT SURGERY     HERNIA REPAIR     KNEE SURGERY Right 08/08/2021   NASAL SINUS SURGERY      Family History  Problem Relation Age of Onset   GER disease Mother    Dementia Mother    Breast cancer Mother    Heart disease Father        MI at 71   Scoliosis Daughter    Colon cancer Neg Hx     Allergies  Allergen Reactions   Other Other (See Comments)    Other Reaction: when taking for an extended period of time causes mouth ulcers and esophagus irritation Other Reaction: when taking for an extended period of time causes mouth ulcers and esophagus irritation    Fenofibric Acid Other (See Comments)    GI upset   Nsaids     REACTION: No long term use   Statins Other (See Comments)    Intolerant, myalgias Intolerant, myalgias Intolerant, myalgias Intolerant, myalgias Intolerant, myalgias Intolerant, myalgias     Trilipix [Choline Fenofibrate]     GI upset   Wasp Venom Swelling    Current Outpatient Medications on File Prior to Visit  Medication Sig Dispense Refill   ALPHA LIPOIC ACID PO Take 1 tablet by mouth daily. Daily     calcium carbonate (OS-CAL) 600 MG TABS Take 1,200 mg by mouth 2 (two) times daily with a meal.     cholecalciferol (VITAMIN D) 1000 UNITS tablet Take 1,000 Units by mouth daily.     clotrimazole-betamethasone (LOTRISONE) cream  APPLY TOPICALLY 2 (TWO) TIMES DAILY AS NEEDED. 30 g 0   diclofenac sodium (VOLTAREN) 1 % GEL Apply 2-4 grams to affected area up to 4 times daily 4 Tube 2   ELIQUIS 5 MG TABS tablet TAKE 1 TABLET BY MOUTH TWICE A DAY 60 tablet 0   fenofibrate micronized (LOFIBRA) 134 MG capsule TAKE 1 CAPSULE (134 MG TOTAL) BY MOUTH AT BEDTIME. FOR CHOLESTEROL. 90 capsule 0   gabapentin (NEURONTIN) 100 MG capsule TAKE 1 CAPSULE BY MOUTH THREE TIMES A DAY 270 capsule 0   Ginger, Zingiber officinalis, (GINGER PO) Take 1 tablet by mouth daily.     levothyroxine (SYNTHROID) 50 MCG tablet TAKE 1 TABLET BY MOUTH EVERY MORNING WITH WATER ON AN EMPTY STOMACH. NO OTHER FOODS/MEDS FOR 30 MINS 90 tablet 0   metoprolol tartrate (LOPRESSOR) 25 MG tablet TAKE 1 TAB BY MOUTH TWICE A DAY AND EXTRA TABLET AS NEEDED FOR BREAKTHROUGH PALPITATIONS AS DIRECTED 270 tablet 2   NON FORMULARY Take 1 capsule by mouth daily. Tumeric      Daily     omeprazole (PRILOSEC) 20 MG capsule TAKE 1 CAPSULE (20 MG TOTAL) BY MOUTH DAILY. FOR HEARTBURN. 90 capsule 0   sulfamethoxazole-trimethoprim (BACTRIM DS) 800-160 MG tablet Take 1 tablet by mouth 2 (two) times daily.     tretinoin (RETIN-A) 0.05 % cream Apply 1 application topically daily as needed (as directed per dermatologist).   3   fish oil-omega-3 fatty acids 1000 MG capsule Take 2 g by mouth daily. (Patient not taking: Reported on 10/17/2021)     No current facility-administered medications on file prior to visit.    BP 110/74   Pulse 82   Temp  98.6 F (37 C) (Oral)   Ht 4' 8.5" (1.435 m)   Wt 150 lb (68 kg)   SpO2 95%   BMI 33.04 kg/m  Objective:   Physical Exam HENT:     Right Ear: Tympanic membrane and ear canal normal.     Left Ear: Tympanic membrane and ear canal normal.     Nose: Nose normal.  Eyes:     Conjunctiva/sclera: Conjunctivae normal.     Pupils: Pupils are equal, round, and reactive to light.  Neck:     Thyroid: No thyromegaly.  Cardiovascular:     Rate and Rhythm: Normal rate and regular rhythm.     Heart sounds: No murmur heard. Pulmonary:     Effort: Pulmonary effort is normal.     Breath sounds: Normal breath sounds. No rales.  Abdominal:     General: Bowel sounds are normal.     Palpations: Abdomen is soft.     Tenderness: There is no abdominal tenderness.  Musculoskeletal:     Cervical back: Neck supple.     Lumbar back: Decreased range of motion.     Right knee: Swelling present. No erythema. Decreased range of motion.  Lymphadenopathy:     Cervical: No cervical adenopathy.  Skin:    General: Skin is warm and dry.     Findings: No rash.  Neurological:     Mental Status: She is alert and oriented to person, place, and time.     Cranial Nerves: No cranial nerve deficit.     Deep Tendon Reflexes: Reflexes are normal and symmetric.           Assessment & Plan:   Problem List Items Addressed This Visit       Cardiovascular and Mediastinum   Bilateral pulmonary embolism (  [place])    Continue Eliquis 5 mg BID for at least 3 months which will be through November 13, 2021.  Per orthopedic surgeon's notes, PE was unprovoked occurring just prior to surgery, however, I do not see a CTA on file that confirms PE prior to admission. I do see CTA from 08/10/21 (2 days after surgery). See notes in Care Everywhere.   Since the patient and I believe that her PE was provoked from surgery, as there is no CTA on file prior to surgery, we will continue Eliquis 5 mg BID for a total of 3 months. She has no  prior DVT or PE history.         Digestive   GERD    Improved.  Continue omeprazole 20 mg daily. Continue to monitor.         Endocrine   Hypothyroid    She is taking levothyroxine correctly.  Continue levothyroxine 50 mcg daily. Reviewed TSH from last week.      Thyroid nodule, uninodular    Patient was notified by Dr. Harlow Asa that further thyroid US was not needed.        Other   Dyslipidemia    Statin intolerant.   Continue fenofibrate 134 mg daily. Continue Fish Oil.      Fibromyalgia    Controlled.  Continue gabapentin 300 mg HS. Continue to monitor.       Palpitations    Controlled  Continue metoprolol tartrate 25 mg BID and an extra 25 mg tablet PRN for breakthrough palpitations.       Decreased renal function    Stable. Recent BMP reviewed.      Anemia    Recent CBC and iron studies reviewed. Labs are stable.  No alarm signs.  Repeat CBC 1 month after she completes Eliquis.      Preventative health care - Primary    Due for Shingrix vaccine, she will obtain this from the pharmacy. Other vaccines UTD. Mammogram UTD. Bone density scan UTD.  Encouraged regular walking/activity.  Exam today stable. Labs reviewed.       Long term current use of antibiotics    For MRSA prevention as she has a history of recurrence.  Continue Bactrim DS 1 tab BID. We will be taking over this RX from infections disease.       Frequent headaches    Chronic, no alarm signs during HPI or exam today. Reviewed CT head from 2021.  Topamax caused diarrhea. Already on metoprolol for palpitations.   Consider low dose amitriptyline for headache prevention. She would like to hold off for now. She will update when ready.      History of MRSA infection    No longer following with infectious disease.   Continue Bactrim DS 1 tablet BID. We will take over this prescription.       S/P total knee arthroplasty, right    Stable.  Continue physical  therapy. Following with orthopedics.       GAD (generalized anxiety disorder)    Patient only took for 1 week before she discontinued.  Patient is doing much better. Continue to monitor off Zoloft.          [name], NP

## 2021-10-17 NOTE — Assessment & Plan Note (Signed)
For MRSA prevention as she has a history of recurrence.  Continue Bactrim DS 1 tab BID. We will be taking over this RX from infections [facility].

## 2021-10-17 NOTE — Assessment & Plan Note (Signed)
No longer following with infectious disease.   Continue Bactrim DS 1 tablet BID. We will take over this prescription.

## 2021-10-17 NOTE — Assessment & Plan Note (Signed)
She is taking levothyroxine correctly.  Continue levothyroxine 50 mcg daily. Reviewed TSH from last week.

## 2021-10-17 NOTE — Assessment & Plan Note (Addendum)
Continue Eliquis 5 mg BID for at least 3 months which will be through November 13, 2021.  Per orthopedic surgeon's notes, PE was unprovoked occurring just prior to surgery, however, I do not see a CTA on file that confirms PE prior to admission. I do see CTA from 08/10/21 (2 days after surgery). See notes in Care Everywhere.   Since the patient and I believe that her PE was provoked from surgery, as there is no CTA on file prior to surgery, we will continue Eliquis 5 mg BID for a total of 3 months. She has no prior DVT or PE history.

## 2021-10-17 NOTE — Assessment & Plan Note (Signed)
Stable.  Continue physical therapy. Following with orthopedics.

## 2021-10-17 NOTE — Assessment & Plan Note (Signed)
Recent CBC and iron studies reviewed. Labs are stable.  No alarm signs.  Repeat CBC 1 month after she completes Eliquis.

## 2021-10-17 NOTE — Assessment & Plan Note (Signed)
Patient only took for 1 week before she discontinued.  Patient is doing much better. Continue to monitor off Zoloft.

## 2021-10-17 NOTE — Patient Instructions (Signed)
Complete Eliquis for one more month, then stop.  Schedule a lab appointment for 1 month after you complete your Eliquis.   Follow up with orthopedics as scheduled.  Complete your shingles vaccines at the pharmacy.  It was a pleasure to see you today!  Preventive Care 57 Years and Older, Female Preventive care refers to lifestyle choices and visits with your health care provider that can promote health and wellness. Preventive care visits are also called wellness exams. What can I expect for my preventive care visit? Counseling Your health care provider may ask you questions about your: Medical history, including: Past medical problems. Family medical history. Pregnancy and menstrual history. History of falls. Current health, including: Memory and ability to understand (cognition). Emotional well-being. Home life and relationship well-being. Sexual activity and sexual health. Lifestyle, including: Alcohol, nicotine or tobacco, and drug use. Access to firearms. Diet, exercise, and sleep habits. Work and work Statistician. Sunscreen use. Safety issues such as seatbelt and bike helmet use. Physical exam Your health care provider will check your: Height and weight. These may be used to calculate your BMI (body mass index). BMI is a measurement that tells if you are at a healthy weight. Waist circumference. This measures the distance around your waistline. This measurement also tells if you are at a healthy weight and may help predict your risk of certain diseases, such as type 2 diabetes and high blood pressure. Heart rate and blood pressure. Body temperature. Skin for abnormal spots. What immunizations do I need?  Vaccines are usually given at various ages, according to a schedule. Your health care provider will recommend vaccines for you based on your age, medical history, and lifestyle or other factors, such as travel or where you work. What tests do I need? Screening Your  health care provider may recommend screening tests for certain conditions. This may include: Lipid and cholesterol levels. Hepatitis C test. Hepatitis B test. HIV (human immunodeficiency virus) test. STI (sexually transmitted infection) testing, if you are at risk. Lung cancer screening. Colorectal cancer screening. Diabetes screening. This is done by checking your blood sugar (glucose) after you have not eaten for a while (fasting). Mammogram. Talk with your health care provider about how often you should have regular mammograms. BRCA-related cancer screening. This may be done if you have a family history of breast, ovarian, tubal, or peritoneal cancers. Bone density scan. This is done to screen for osteoporosis. Talk with your health care provider about your test results, treatment options, and if necessary, the need for more tests. Follow these instructions at home: Eating and drinking  Eat a diet that includes fresh fruits and vegetables, whole grains, lean protein, and low-fat dairy products. Limit your intake of foods with high amounts of sugar, saturated fats, and salt. Take vitamin and mineral supplements as recommended by your health care provider. Do not drink alcohol if your health care provider tells you not to drink. If you drink alcohol: Limit how much you have to 0-1 drink a day. Know how much alcohol is in your drink. In the [place] one drink equals one 12 oz bottle of beer (355 mL), one 5 oz glass of wine (148 mL), or one 1 oz glass of hard liquor (44 mL). Lifestyle Brush your teeth every morning and night with fluoride toothpaste. Floss one time each day. Exercise for at least 30 minutes 5 or more days each week. Do not use any products that contain nicotine or tobacco. These products include cigarettes, chewing tobacco,  and vaping devices, such as e-cigarettes. If you need help quitting, ask your health care provider. Do not use drugs. If you are sexually active, practice  safe sex. Use a condom or other form of protection in order to prevent STIs. Take aspirin only as told by your health care provider. Make sure that you understand how much to take and what form to take. Work with your health care provider to find out whether it is safe and beneficial for you to take aspirin daily. Ask your health care provider if you need to take a cholesterol-lowering medicine (statin). Find healthy ways to manage stress, such as: Meditation, yoga, or listening to music. Journaling. Talking to a trusted person. Spending time with friends and family. Minimize exposure to UV radiation to reduce your risk of skin cancer. Safety Always wear your seat belt while driving or riding in a vehicle. Do not drive: If you have been drinking alcohol. Do not ride with someone who has been drinking. When you are tired or distracted. While texting. If you have been using any mind-altering substances or drugs. Wear a helmet and other protective equipment during sports activities. If you have firearms in your house, make sure you follow all gun safety procedures. What's next? Visit your health care provider once a year for an annual wellness visit. Ask your health care provider how often you should have your eyes and teeth checked. Stay up to date on all vaccines. This information is not intended to replace advice given to you by your health care provider. Make sure you discuss any questions you have with your health care provider. Document Revised: 09/27/2020 Document Reviewed: 09/27/2020 Elsevier Patient Education  [place].

## 2021-10-17 NOTE — Assessment & Plan Note (Signed)
Improved.  Continue omeprazole 20 mg daily. Continue to monitor.  

## 2021-10-17 NOTE — Assessment & Plan Note (Signed)
Controlled.  Continue gabapentin 300 mg HS. Continue to monitor.

## 2021-10-17 NOTE — Assessment & Plan Note (Signed)
Chronic, no alarm signs during HPI or exam today. Reviewed CT head from 2021.  Topamax caused diarrhea. Already on metoprolol for palpitations.   Consider low dose amitriptyline for headache prevention. She would like to hold off for now. She will update when ready.

## 2021-10-17 NOTE — Assessment & Plan Note (Addendum)
Controlled  Continue metoprolol tartrate 25 mg BID and an extra 25 mg tablet PRN for breakthrough palpitations.

## 2021-10-19 DIAGNOSIS — M25661 Stiffness of right knee, not elsewhere classified: Secondary | ICD-10-CM | POA: Diagnosis not present

## 2021-10-19 DIAGNOSIS — M25561 Pain in right knee: Secondary | ICD-10-CM | POA: Diagnosis not present

## 2021-10-19 DIAGNOSIS — R262 Difficulty in walking, not elsewhere classified: Secondary | ICD-10-CM | POA: Diagnosis not present

## 2021-10-19 DIAGNOSIS — M6281 Muscle weakness (generalized): Secondary | ICD-10-CM | POA: Diagnosis not present

## 2021-10-25 DIAGNOSIS — M79604 Pain in right leg: Secondary | ICD-10-CM | POA: Diagnosis not present

## 2021-10-25 DIAGNOSIS — R52 Pain, unspecified: Secondary | ICD-10-CM | POA: Diagnosis not present

## 2021-10-25 DIAGNOSIS — Z96651 Presence of right artificial knee joint: Secondary | ICD-10-CM | POA: Diagnosis not present

## 2021-10-25 DIAGNOSIS — Z471 Aftercare following joint replacement surgery: Secondary | ICD-10-CM | POA: Diagnosis not present

## 2021-10-31 DIAGNOSIS — M25661 Stiffness of right knee, not elsewhere classified: Secondary | ICD-10-CM | POA: Diagnosis not present

## 2021-10-31 DIAGNOSIS — R262 Difficulty in walking, not elsewhere classified: Secondary | ICD-10-CM | POA: Diagnosis not present

## 2021-10-31 DIAGNOSIS — M25561 Pain in right knee: Secondary | ICD-10-CM | POA: Diagnosis not present

## 2021-10-31 DIAGNOSIS — M6281 Muscle weakness (generalized): Secondary | ICD-10-CM | POA: Diagnosis not present

## 2021-11-02 DIAGNOSIS — R262 Difficulty in walking, not elsewhere classified: Secondary | ICD-10-CM | POA: Diagnosis not present

## 2021-11-02 DIAGNOSIS — M6281 Muscle weakness (generalized): Secondary | ICD-10-CM | POA: Diagnosis not present

## 2021-11-02 DIAGNOSIS — M25661 Stiffness of right knee, not elsewhere classified: Secondary | ICD-10-CM | POA: Diagnosis not present

## 2021-11-02 DIAGNOSIS — M25561 Pain in right knee: Secondary | ICD-10-CM | POA: Diagnosis not present

## 2021-11-07 DIAGNOSIS — R262 Difficulty in walking, not elsewhere classified: Secondary | ICD-10-CM | POA: Diagnosis not present

## 2021-11-07 DIAGNOSIS — M25561 Pain in right knee: Secondary | ICD-10-CM | POA: Diagnosis not present

## 2021-11-07 DIAGNOSIS — M6281 Muscle weakness (generalized): Secondary | ICD-10-CM | POA: Diagnosis not present

## 2021-11-07 DIAGNOSIS — M25661 Stiffness of right knee, not elsewhere classified: Secondary | ICD-10-CM | POA: Diagnosis not present

## 2021-11-13 DIAGNOSIS — M25661 Stiffness of right knee, not elsewhere classified: Secondary | ICD-10-CM | POA: Diagnosis not present

## 2021-11-13 DIAGNOSIS — M25561 Pain in right knee: Secondary | ICD-10-CM | POA: Diagnosis not present

## 2021-11-13 DIAGNOSIS — R262 Difficulty in walking, not elsewhere classified: Secondary | ICD-10-CM | POA: Diagnosis not present

## 2021-11-13 DIAGNOSIS — M6281 Muscle weakness (generalized): Secondary | ICD-10-CM | POA: Diagnosis not present

## 2021-11-16 DIAGNOSIS — M25561 Pain in right knee: Secondary | ICD-10-CM | POA: Diagnosis not present

## 2021-11-16 DIAGNOSIS — M6281 Muscle weakness (generalized): Secondary | ICD-10-CM | POA: Diagnosis not present

## 2021-11-16 DIAGNOSIS — M25661 Stiffness of right knee, not elsewhere classified: Secondary | ICD-10-CM | POA: Diagnosis not present

## 2021-11-16 DIAGNOSIS — R262 Difficulty in walking, not elsewhere classified: Secondary | ICD-10-CM | POA: Diagnosis not present

## 2021-11-22 DIAGNOSIS — L720 Epidermal cyst: Secondary | ICD-10-CM | POA: Diagnosis not present

## 2021-11-30 DIAGNOSIS — M25561 Pain in right knee: Secondary | ICD-10-CM | POA: Diagnosis not present

## 2021-11-30 DIAGNOSIS — M6281 Muscle weakness (generalized): Secondary | ICD-10-CM | POA: Diagnosis not present

## 2021-11-30 DIAGNOSIS — R262 Difficulty in walking, not elsewhere classified: Secondary | ICD-10-CM | POA: Diagnosis not present

## 2021-11-30 DIAGNOSIS — M25661 Stiffness of right knee, not elsewhere classified: Secondary | ICD-10-CM | POA: Diagnosis not present

## 2021-12-08 ENCOUNTER — Other Ambulatory Visit: Payer: Self-pay | Admitting: Primary Care

## 2021-12-08 DIAGNOSIS — K219 Gastro-esophageal reflux disease without esophagitis: Secondary | ICD-10-CM

## 2021-12-08 DIAGNOSIS — E039 Hypothyroidism, unspecified: Secondary | ICD-10-CM

## 2021-12-08 DIAGNOSIS — F411 Generalized anxiety disorder: Secondary | ICD-10-CM

## 2021-12-08 DIAGNOSIS — I2699 Other pulmonary embolism without acute cor pulmonale: Secondary | ICD-10-CM

## 2021-12-08 DIAGNOSIS — E785 Hyperlipidemia, unspecified: Secondary | ICD-10-CM

## 2021-12-13 NOTE — Progress Notes (Signed)
Office Visit Note  Patient: [name] [name]             Date of Birth: 03-27-1936           MRN: [id number]             PCP: [name], NP Referring: [name], NP Visit Date: 12/26/2021 Occupation: '@GUAROCC'$ @  Subjective:  Lower back pain  History of Present Illness: [name] [name] is a 86 y.o. female with history of osteoarthritis, degenerative disc disease and fibromyalgia syndrome.  She states she underwent right total knee replacement in April 2023.  She states that while she was in the hospital she developed pulmonary embolism after the knee replacement.  She was treated with Eliquis for 3 months and then it was discontinued.  She states she had been going to physical therapy.  She has been experiencing more discomfort in her left knee since the physical therapy.  Her lower back pain continues.  She has difficulty walking due to her lower back pain.  She continues to have some pain and discomfort due to fibromyalgia.  Activities of Daily Living:  Patient reports morning stiffness for all day. Patient Reports nocturnal pain.  Difficulty dressing/grooming: Denies Difficulty climbing stairs: Denies Difficulty getting out of chair: Denies Difficulty using hands for taps, buttons, cutlery, and/or writing: Reports  Review of Systems  Constitutional:  Positive for fatigue.  HENT:  Positive for mouth sores and mouth dryness.   Eyes:  Negative for dryness.  Respiratory:  Negative for difficulty breathing.   Cardiovascular:  Negative for chest pain and palpitations.  Gastrointestinal:  Negative for blood in stool, constipation and diarrhea.  Endocrine: Negative for increased urination.  Genitourinary:  Negative for involuntary urination.  Musculoskeletal:  Positive for joint pain, joint pain, joint swelling, myalgias, muscle weakness, morning stiffness, muscle tenderness and myalgias. Negative for gait problem.  Skin:  Negative for color change, rash, hair loss and  sensitivity to sunlight.  Allergic/Immunologic: Negative for susceptible to infections.  Neurological:  Positive for headaches. Negative for dizziness.  Hematological:  Negative for swollen glands.  Psychiatric/Behavioral:  Positive for sleep disturbance. Negative for depressed mood. The patient is not nervous/anxious.     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   Chronic left shoulder pain 01/21/2018   Chronic pain of left knee 01/21/2018   Chronic pain of right knee 01/21/2018   Osteoarthritis of glenohumeral joint, left 01/21/2018   Decreased renal function 01/29/2017   Anemia 01/29/2017   Preventative health care 01/29/2017   Other fatigue 09/03/2016   Primary insomnia 09/03/2016   Primary osteoarthritis of both knees 09/03/2016   DJD (degenerative joint disease), cervical 09/03/2016   History of MRSA infection 07/31/2016   Chronic rhinitis 07/24/2016   Sinusitis chronic, sphenoidal 07/24/2016   Osteopenia 03/04/2016   Palpitations 09/19/2014   Thyroid nodule, uninodular 09/18/2011   Hypothyroid 08/19/2011   Postmenopausal HRT (hormone replacement therapy) 08/13/2011   Endocarditis 04/24/2011  Mitral regurgitation    Lumbar spine pain    Dyslipidemia    Chest pain    Statin intolerance    Mitral valve prolapse    Ejection fraction    Fibromyalgia    ADENOMATOUS COLONIC POLYP 08/15/2007   GERD 08/15/2007   BARRETTS  ESOPHAGUS 08/15/2007   Diaphragmatic hernia 08/15/2007   Osteoarthritis 08/15/2007   CATARACT EXTRACTION, HX OF 08/15/2007   Hiatal hernia 08/15/2007    Past Medical History:  Diagnosis Date   Anxiety    prev on prozac   Chest pain    Felt to be noncardiac in the past   Chronic rectal fissure    Colon polyp    Dyslipidemia    Fibromyalgia    GERD (gastroesophageal reflux disease)    Barrett's esophagus   Hiatal hernia    Lumbar spine pain    Complex lumbar spine surgery at [place], [id number], complicated, MRSA, much of the appliance is removed, patient on chronic antibiotics   Melanoma ([place])    Mitral regurgitation    a. 2007 Echo Mild MR; b. 10/2016 Echo: EF 55-60%, no rwma, GrI DD, Mild MR.   Osteoarthritis    Scoliosis    Sinusitis    Multiple sinus operations in the past   Statin intolerance    As of 2009, no further attempts to use statins   Thyroid nodule     Family History  Problem Relation Age of Onset   GER disease Mother    Dementia Mother    Breast cancer Mother    Heart disease Father        MI at 42   Scoliosis Daughter    Colon cancer Neg Hx    Past Surgical History:  Procedure Laterality Date   ABDOMINAL HYSTERECTOMY     BACK SURGERY     CHOLECYSTECTOMY     FOOT SURGERY     HERNIA REPAIR     KNEE SURGERY Right 08/08/2021   total knee replacement   NASAL SINUS SURGERY     Social History   Social History Narrative   From [place]   Married (second [phone]   3 kids, 2 of whom are local   Retired, Armed forces technical officer History  Administered Date(s) Administered   Fluad Quad(high Dose 65+) 03/01/2020   Influenza Split 02/03/2012   Influenza,inj,Quad PF,6+ Mos 02/20/2017, 02/03/2018, 02/10/2019   Influenza-Unspecified 02/02/2016, 01/13/2017, 02/02/2018   Moderna Covid-19 Vaccine Bivalent Booster 46yr & up 01/23/2021   Moderna Sars-Covid-2 Vaccination 04/26/2019, 05/25/2019, 02/11/2020, 08/01/2020   Pneumococcal Conjugate-13  02/10/2019   Pneumococcal Polysaccharide-23 02/03/2018   Td 02/03/2016   Tdap 10/22/2017     Objective: Vital Signs: BP 113/76 (BP Location: Left Arm, Patient Position: Sitting, Cuff Size: Normal)   Pulse 76   Resp 15   Ht '4\' 9"'$  (1.448 m)   Wt 155 lb (70.3 kg)   BMI 33.54 kg/m    Physical Exam Vitals and nursing note reviewed.  Constitutional:      Appearance: She is well-developed.  HENT:     Head: Normocephalic and atraumatic.  Eyes:     Conjunctiva/sclera: Conjunctivae normal.  Cardiovascular:     Rate and Rhythm: Normal rate and regular rhythm.     Heart sounds: Normal heart sounds.  Pulmonary:     Effort: Pulmonary effort is normal.     Breath sounds: Normal breath sounds.  Abdominal:     General: Bowel sounds are normal.  Palpations: Abdomen is soft.  Musculoskeletal:     Cervical back: Normal range of motion.  Lymphadenopathy:     Cervical: No cervical adenopathy.  Skin:    General: Skin is warm and dry.     Capillary Refill: Capillary refill takes less than 2 seconds.  Neurological:     Mental Status: She is alert and oriented to person, place, and time.  Psychiatric:        Behavior: Behavior normal.      Musculoskeletal Exam: She had limited lateral rotation of the cervical spine.  She had thoracolumbar kyphosis and scoliosis.  She had painful range of motion of her lumbar spine.  She had 90 degree abduction of the shoulders with some discomfort.  Elbow joints and wrist joints in good range of motion.  Severe PIP DIP thickening and subluxation was noted.  [place] prominence was noted.  Hip joints and knee joints were in good range of motion.  She had surgical scar over her right knee joint.  There was no tenderness on palpation of her knee joints, ankles or MTPs.  CDAI Exam: CDAI Score: -- Patient Global: --; Provider Global: -- Swollen: --; Tender: -- Joint Exam 12/26/2021   No joint exam has been documented for this visit   There is currently no  information documented on the homunculus. Go to the Rheumatology activity and complete the homunculus joint exam.  Investigation: No additional findings.  Imaging: No results found.  Recent Labs: Lab Results  Component Value Date   WBC 3.8 (L) 10/10/2021   HGB 10.7 (L) 10/10/2021   PLT 180.0 10/10/2021   NA 140 10/10/2021   K 4.2 10/10/2021   CL 104 10/10/2021   CO2 26 10/10/2021   GLUCOSE 101 (H) 10/10/2021   BUN 23 10/10/2021   CREATININE 0.97 10/10/2021   BILITOT 0.5 09/19/2020   ALKPHOS 50 09/19/2020   AST 18 09/19/2020   ALT 12 09/19/2020   PROT 6.4 09/19/2020   ALBUMIN 4.5 09/19/2020   CALCIUM 9.1 10/10/2021    Speciality Comments: Repeat DEXA due June 2021-ACY 11/18/2018  Procedures:  No procedures performed Allergies: Other, Fenofibric acid, Nsaids, Statins, Trilipix [choline fenofibrate], and Wasp venom   Assessment / Plan:     Visit Diagnoses: Primary osteoarthritis of both hands - She has severe PIP and DIP thickening consistent with osteoarthritis of both hands.  She has subluxation of several PIP and DIP joints.  Joint protection muscle strengthening was discussed.  Primary osteoarthritis of left shoulder-she continues to have pain and discomfort in her shoulder joint with limited range of motion.  Primary osteoarthritis of both knees -she has been experiencing increased pain in her left knee joint since she had right knee joint replacement.  She relates it to aggressive physical therapy.  Status post right total knee replacement-August 08, 2021 by Dr. Ennis Forts in [facility].  DDD (degenerative disc disease), cervical-she had limited lateral rotation of the cervical spine.  DDD (degenerative disc disease), lumbar - S/p fusion T11-S1.  She continues to have pain and discomfort in her lower back.  She takes gabapentin 100 mg at bedtime to get some relief.  Fibromyalgia -she continues to have generalized pain and discomfort.  Need for regular exercise  was emphasized.  Other fatigue-related to insomnia and fibromyalgia.  Primary insomnia-history hygiene was discussed.  Osteopenia of multiple sites - Previously treated with Reclast for 2 years.  T score -2.3 09/2015. T-score: -2.2 09/2017.  DEXA updated on 01/15/21: RFN BMD 0.633  with T-score -1.9.  Calcium [place]y diet and exercise was emphasized.  Other medical problems are listed as follows:  History of recent fall  History of Barrett's esophagus  History of anemia  History of gastroesophageal reflux (GERD)  History of hypothyroidism  History of mitral valve prolapse  History of hiatal hernia  History of hyperlipidemia  Orders: No orders of the defined types were placed in this encounter.  No orders of the defined types were placed in this encounter.    Follow-Up Instructions: Return in about 6 months (around 06/26/2022).   Bo Merino, MD  Note - This record has been created using Editor, commissioning.  Chart creation errors have been sought, but may not always  have been located. Such creation errors do not reflect on  the standard of medical care.

## 2021-12-26 ENCOUNTER — Ambulatory Visit: Payer: Medicare Other | Attending: Rheumatology | Admitting: Rheumatology

## 2021-12-26 ENCOUNTER — Encounter: Payer: Self-pay | Admitting: Rheumatology

## 2021-12-26 VITALS — BP 113/76 | HR 76 | Resp 15 | Ht <= 58 in | Wt 155.0 lb

## 2021-12-26 DIAGNOSIS — M170 Bilateral primary osteoarthritis of knee: Secondary | ICD-10-CM

## 2021-12-26 DIAGNOSIS — M5136 Other intervertebral disc degeneration, lumbar region: Secondary | ICD-10-CM

## 2021-12-26 DIAGNOSIS — Z96651 Presence of right artificial knee joint: Secondary | ICD-10-CM

## 2021-12-26 DIAGNOSIS — M19041 Primary osteoarthritis, right hand: Secondary | ICD-10-CM

## 2021-12-26 DIAGNOSIS — Z8679 Personal history of other diseases of the circulatory system: Secondary | ICD-10-CM

## 2021-12-26 DIAGNOSIS — Z862 Personal history of diseases of the blood and blood-forming organs and certain disorders involving the immune mechanism: Secondary | ICD-10-CM

## 2021-12-26 DIAGNOSIS — R5383 Other fatigue: Secondary | ICD-10-CM

## 2021-12-26 DIAGNOSIS — M19012 Primary osteoarthritis, left shoulder: Secondary | ICD-10-CM

## 2021-12-26 DIAGNOSIS — Z8639 Personal history of other endocrine, nutritional and metabolic disease: Secondary | ICD-10-CM

## 2021-12-26 DIAGNOSIS — M8589 Other specified disorders of bone density and structure, multiple sites: Secondary | ICD-10-CM

## 2021-12-26 DIAGNOSIS — M19042 Primary osteoarthritis, left hand: Secondary | ICD-10-CM

## 2021-12-26 DIAGNOSIS — F5101 Primary insomnia: Secondary | ICD-10-CM

## 2021-12-26 DIAGNOSIS — M797 Fibromyalgia: Secondary | ICD-10-CM

## 2021-12-26 DIAGNOSIS — Z9181 History of falling: Secondary | ICD-10-CM

## 2021-12-26 DIAGNOSIS — M5030 Other cervical disc degeneration, unspecified cervical region: Secondary | ICD-10-CM

## 2021-12-26 DIAGNOSIS — Z8719 Personal history of other diseases of the digestive system: Secondary | ICD-10-CM

## 2022-01-08 ENCOUNTER — Other Ambulatory Visit: Payer: Self-pay | Admitting: Primary Care

## 2022-01-08 ENCOUNTER — Encounter: Payer: Self-pay | Admitting: Primary Care

## 2022-01-08 ENCOUNTER — Ambulatory Visit (INDEPENDENT_AMBULATORY_CARE_PROVIDER_SITE_OTHER)
Admission: RE | Admit: 2022-01-08 | Discharge: 2022-01-08 | Disposition: A | Discharge status: Home / Self Care | Payer: Medicare Other | Source: Ambulatory Visit | Attending: Primary Care | Admitting: Primary Care

## 2022-01-08 ENCOUNTER — Ambulatory Visit (INDEPENDENT_AMBULATORY_CARE_PROVIDER_SITE_OTHER): Payer: Medicare Other | Admitting: Primary Care

## 2022-01-08 ENCOUNTER — Telehealth: Payer: Self-pay | Admitting: Radiology

## 2022-01-08 VITALS — BP 138/76 | HR 78 | Temp 97.3°F | Ht <= 58 in | Wt 156.0 lb

## 2022-01-08 DIAGNOSIS — R519 Headache, unspecified: Secondary | ICD-10-CM

## 2022-01-08 DIAGNOSIS — I340 Nonrheumatic mitral (valve) insufficiency: Secondary | ICD-10-CM | POA: Diagnosis not present

## 2022-01-08 DIAGNOSIS — Z820 Family history of epilepsy and other diseases of the nervous system: Secondary | ICD-10-CM

## 2022-01-08 DIAGNOSIS — R0600 Dyspnea, unspecified: Secondary | ICD-10-CM | POA: Diagnosis not present

## 2022-01-08 DIAGNOSIS — D649 Anemia, unspecified: Secondary | ICD-10-CM

## 2022-01-08 DIAGNOSIS — R0609 Other forms of dyspnea: Secondary | ICD-10-CM

## 2022-01-08 DIAGNOSIS — Z23 Encounter for immunization: Secondary | ICD-10-CM | POA: Diagnosis not present

## 2022-01-08 LAB — BRAIN NATRIURETIC PEPTIDE: Pro B Natriuretic peptide (BNP): 95 pg/mL (ref 0.0–100.0)

## 2022-01-08 LAB — CBC
HCT: 24.5 % — ABNORMAL LOW (ref 36.0–46.0)
Hemoglobin: 7.7 g/dL — CL (ref 12.0–15.0)
MCHC: 31.6 g/dL (ref 30.0–36.0)
MCV: 86.7 fl (ref 78.0–100.0)
Platelets: 234 10*3/uL (ref 150.0–400.0)
RBC: 2.83 Mil/uL — ABNORMAL LOW (ref 3.87–5.11)
RDW: 16.9 % — ABNORMAL HIGH (ref 11.5–15.5)
WBC: 3.8 10*3/uL — ABNORMAL LOW (ref 4.0–10.5)

## 2022-01-08 MED ORDER — PREDNISONE 20 MG PO TABS: ORAL_TABLET | ORAL | 0 refills | Status: DC

## 2022-01-08 NOTE — Progress Notes (Signed)
Subjective:    Patient ID: [name] [name], female    DOB: 12-Sep-1935, 86 y.o.   MRN: [id number]  Headache  Associated symptoms include nausea. Pertinent negatives include no photophobia.  Shortness of Breath Associated symptoms include headaches. Pertinent negatives include no chest pain.    [name] [name] is a very pleasant 86 y.o. female with a history of osteoarthritis, fibromyalgia, fatigue, decreased renal function, hypothyroidism, bilateral pulmonary embolism who presents today to discuss headaches and shortness of breath.  1) Frequent Headaches: Chronic history of headaches for which are located to the bilateral frontal lobes, mid occipital lobes. Historically, headaches will occur once weekly lasting for a few days. She will take Tylenol for headaches with eventual resolve. She has a known history of ocular migraines.   Her current headache began two weeks ago for which are located to the bilateral frontal and occipital lobes. Her headache is daily. She has been taking Tylenol without improvement. She denies photophobia and phonophobia.   She has an allergy to NSAID medications. Typically, prednisone works the best when she has a prolonged headache.   She was initiated on Topamax for headache prevention earlier this year which was effective but caused diarrhea.   2) SOB: Chronic over the last year occurring with exertion and with rest. Over the last few weeks she's noticed an increase in symptoms. She will feel short of breath with walking mild to moderate distances including walking in her home, walking from the parking lot to the store or doctors office. She's noticed intermittent wheezing, questions if she has asthma.   History of bilateral PE in late April 2023, was managed on Eliquis 5 mg BID for three months, last dose was late July 2023. She is checking oxygen levels at home which are running 95-96%.   No prior history of asthma and she was never a smoker. She has a chronic  history of bilateral lower extremity edema that began after her knee surgery. History of mitral valve regurgitation, last echo cardiogram was in 2018. Following with cardiology, last office visit was in December 2022.   Her daughter was recently diagnosed with myasthenia gravis, she questions if she may have this disease as she has very similar symptoms as her daughter.  She has never undergone testing.  Review of Systems  Eyes:  Negative for photophobia.  Respiratory:  Positive for shortness of breath.   Cardiovascular:  Negative for chest pain.  Gastrointestinal:  Positive for nausea.  Neurological:  Positive for headaches.         Past Medical History:  Diagnosis Date   Anxiety    prev on prozac   Chest pain    Felt to be noncardiac in the past   Chronic rectal fissure    Colon polyp    Dyslipidemia    Fibromyalgia    GERD (gastroesophageal reflux disease)    Barrett's esophagus   Hiatal hernia    Lumbar spine pain    Complex lumbar spine surgery at [place], [id number], complicated, MRSA, much of the appliance is removed, patient on chronic antibiotics   Melanoma ([place])    Mitral regurgitation    a. 2007 Echo Mild MR; b. 10/2016 Echo: EF 55-60%, no rwma, GrI DD, Mild MR.   Osteoarthritis    Scoliosis    Sinusitis    Multiple sinus operations in the past   Statin intolerance    As of 2009, no further attempts to use statins   Thyroid nodule  Social History   Socioeconomic History   Marital status: Married    Spouse name: Not on file   Number of children: Not on file   Years of education: Not on file   Highest education level: Not on file  Occupational History   Not on file  Tobacco Use   Smoking status: Never    Passive exposure: Never   Smokeless tobacco: Never  Vaping Use   Vaping Use: Never used  Substance and Sexual Activity   Alcohol use: No   Drug use: No   Sexual activity: Not on file  Other Topics Concern   Not on file  Social History Narrative    From [place]   Married (second [phone]   3 kids, 2 of whom are local   Retired, Camera operator   Social Determinants of Health   Financial Resource Strain: [place]  (10/02/2021)   Overall Financial Resource Strain (CARDIA)    Difficulty of Paying Living Expenses: Not hard at all  Food Insecurity: No [place] (10/02/2021)   Hunger Vital Sign    Worried About Running Out of Food in the Last Year: Never true    [place] in the Last Year: Never true  Transportation Needs: No Transportation Needs (10/02/2021)   PRAPARE - Hydrologist (Medical): No    Lack of Transportation (Non-Medical): No  Physical Activity: Insufficiently Active (10/02/2021)   Exercise Vital Sign    Days of Exercise per Week: 2 days    Minutes of Exercise per Session: 60 min  Stress: No Stress Concern Present (10/02/2021)   [place]    Feeling of Stress : Not at all  Social Connections: [place] (10/02/2021)   Social Connection and Isolation Panel [NHANES]    Frequency of Communication with Friends and Family: More than three times a week    Frequency of Social Gatherings with Friends and Family: More than three times a week    Attends Religious Services: More than 4 times per year    Active Member of [employer] or Organizations: Yes    Attends Music therapist: More than 4 times per year    Marital Status: Married  Human resources officer Violence: Not At Risk (10/02/2021)   Humiliation, Afraid, Rape, and Kick questionnaire    Fear of Current or Ex-Partner: No    Emotionally Abused: No    Physically Abused: No    Sexually Abused: No    Past Surgical History:  Procedure Laterality Date   ABDOMINAL HYSTERECTOMY     BACK SURGERY     CHOLECYSTECTOMY     FOOT SURGERY     HERNIA REPAIR     KNEE SURGERY Right 08/08/2021   total knee replacement   NASAL SINUS SURGERY      Family  History  Problem Relation Age of Onset   GER disease Mother    Dementia Mother    Breast cancer Mother    Heart disease Father        MI at 69   Scoliosis Daughter    Colon cancer Neg Hx     Allergies  Allergen Reactions   Other Other (See Comments)    Other Reaction: when taking for an extended period of time causes mouth ulcers and esophagus irritation Other Reaction: when taking for an extended period of time causes mouth ulcers and esophagus irritation    Fenofibric Acid Other (  See Comments)    GI upset   Nsaids     REACTION: No long term use   Statins Other (See Comments)    Intolerant, myalgias Intolerant, myalgias Intolerant, myalgias Intolerant, myalgias Intolerant, myalgias Intolerant, myalgias    Trilipix [Choline Fenofibrate]     GI upset   Wasp Venom Swelling    Current Outpatient Medications on File Prior to Visit  Medication Sig Dispense Refill   ALPHA LIPOIC ACID PO Take 1 tablet by mouth daily. Daily     calcium carbonate (OS-CAL) 600 MG TABS Take 1,200 mg by mouth 2 (two) times daily with a meal.     cholecalciferol (VITAMIN D) 1000 UNITS tablet Take 1,000 Units by mouth daily.     clotrimazole-betamethasone (LOTRISONE) cream APPLY TOPICALLY 2 (TWO) TIMES DAILY AS NEEDED. 30 g 0   fenofibrate micronized (LOFIBRA) 134 MG capsule TAKE 1 CAPSULE (134 MG TOTAL) BY MOUTH AT BEDTIME. FOR CHOLESTEROL. 90 capsule 2   fish oil-omega-3 fatty acids 1000 MG capsule Take 2 g by mouth daily.     gabapentin (NEURONTIN) 100 MG capsule TAKE 1 CAPSULE BY MOUTH THREE TIMES A DAY 270 capsule 0   Ginger, Zingiber officinalis, (GINGER PO) Take 1 tablet by mouth daily.     levothyroxine (SYNTHROID) 50 MCG tablet TAKE 1 TABLET BY MOUTH EVERY MORNING WITH WATER ON AN EMPTY STOMACH. NO OTHER FOODS/MEDS FOR 30 MINS 90 tablet 2   metoprolol tartrate (LOPRESSOR) 25 MG tablet TAKE 1 TAB BY MOUTH TWICE A DAY AND EXTRA TABLET AS NEEDED FOR BREAKTHROUGH PALPITATIONS AS DIRECTED 270  tablet 2   NON FORMULARY Take 1 capsule by mouth daily. Tumeric      Daily     omeprazole (PRILOSEC) 20 MG capsule TAKE 1 CAPSULE (20 MG TOTAL) BY MOUTH DAILY. FOR HEARTBURN. 90 capsule 2   sulfamethoxazole-trimethoprim (BACTRIM DS) 800-160 MG tablet Take 1 tablet by mouth 2 (two) times daily.     tretinoin (RETIN-A) 0.05 % cream Apply 1 application topically daily as needed (as directed per dermatologist).   3   diclofenac sodium (VOLTAREN) 1 % GEL Apply 2-4 grams to affected area up to 4 times daily (Patient not taking: Reported on 01/08/2022) 4 Tube 2   No current facility-administered medications on file prior to visit.    BP 138/76   Pulse 78   Temp (!) 97.3 F (36.3 C) (Temporal)   Ht '4\' 9"'$  (1.448 m)   Wt 156 lb (70.8 kg)   SpO2 96%   BMI 33.76 kg/m  Objective:   Physical Exam Constitutional:      Appearance: She is not ill-appearing.  Cardiovascular:     Rate and Rhythm: Normal rate and regular rhythm.     Comments: Chronic bilateral lower extremity swelling, right greater than left. Pulmonary:     Effort: Pulmonary effort is normal.     Breath sounds: Normal breath sounds. No wheezing.  Musculoskeletal:     Cervical back: Neck supple.  Skin:    General: Skin is warm and dry.  Neurological:     Mental Status: She is oriented to person, place, and time.           Assessment & Plan:   Problem List Items Addressed This Visit       Cardiovascular and Mediastinum   Mitral regurgitation    We will check echocardiogram to evaluate for progression. Repeat echocardiogram ordered and pending.         Other   Frequent  headaches    Treat current headache with prednisone 40 mg daily x5 days.  Discussed options for frequent headache treatment. Recommend low-dose amitriptyline 10 to 25 mg at bedtime as she cannot tolerate Topamax given diarrhea.  Already managed on beta-blocker.  Consider amitriptyline once acute headache resolves. She will update.       Relevant Medications   predniSONE (DELTASONE) 20 MG tablet   Exertional dyspnea - Primary    Unclear etiology at this point.  It is possible that she could have new PE, but no recent provoking incident.  Fortunately, oxygen saturation stable and at baseline. Repeat echocardiogram pending to evaluate for mitral valve disorder, also to evaluate for heart strain given recent history of pulmonary embolism. BNP pending. Chest x-ray pending.  Will consult with cardiology.      Relevant Orders   ECHOCARDIOGRAM COMPLETE   DG Chest 2 View (Completed)   Brain natriuretic peptide   AChR Abs with Reflex to MuSK   MuSK Antibodies   CBC   Family history of myasthenia gravis    Checking labs today for evaluation.      Relevant Orders   AChR Abs with Reflex to MuSK   MuSK Antibodies   Other Visit Diagnoses     Need for immunization against influenza       Relevant Orders   Flu Vaccine QUAD High Dose(Fluad) (Completed)          [name], NP

## 2022-01-08 NOTE — Telephone Encounter (Signed)
[facility] lab called a critical result, HGB - 7.7. Results given to Allie Bossier

## 2022-01-08 NOTE — Assessment & Plan Note (Signed)
Checking labs today for evaluation.

## 2022-01-08 NOTE — Assessment & Plan Note (Signed)
Treat current headache with prednisone 40 mg daily x5 days.  Discussed options for frequent headache treatment. Recommend low-dose amitriptyline 10 to 25 mg at bedtime as she cannot tolerate Topamax given diarrhea.  Already managed on beta-blocker.  Consider amitriptyline once acute headache resolves. She will update.

## 2022-01-08 NOTE — Patient Instructions (Signed)
Stop by the lab and xray prior to leaving today. I will notify you of your results once received.   You will be contacted regarding your echocardiogram.  Please let us know if you have not been contacted within two weeks.   Start prednisone 20 mg tablets. Take 2 tablets by mouth once daily in the morning for 5 days.  It was a pleasure to see you today!

## 2022-01-08 NOTE — Assessment & Plan Note (Addendum)
Unclear etiology at this point. She does not appear acutely ill. Could be deconditioning.  It is possible that she could have new PE, but no recent provoking incident.  Fortunately, oxygen saturation and heart rate stable and at baseline. Repeat echocardiogram pending to evaluate for mitral valve disorder, also to evaluate for heart strain given recent history of pulmonary embolism. BNP pending. Chest x-ray pending.  Will consult with cardiology.

## 2022-01-08 NOTE — Assessment & Plan Note (Signed)
We will check echocardiogram to evaluate for progression. Repeat echocardiogram ordered and pending.

## 2022-01-08 NOTE — Telephone Encounter (Signed)
See result note.  

## 2022-01-11 ENCOUNTER — Inpatient Hospital Stay: Payer: Medicare Other

## 2022-01-11 ENCOUNTER — Encounter: Payer: Self-pay | Admitting: Internal Medicine

## 2022-01-11 ENCOUNTER — Other Ambulatory Visit: Payer: Self-pay | Admitting: *Deleted

## 2022-01-11 ENCOUNTER — Inpatient Hospital Stay: Payer: Medicare Other | Attending: Internal Medicine | Admitting: Internal Medicine

## 2022-01-11 VITALS — BP 114/63 | HR 74 | Temp 98.1°F | Resp 18 | Wt 156.0 lb

## 2022-01-11 DIAGNOSIS — R519 Headache, unspecified: Secondary | ICD-10-CM | POA: Insufficient documentation

## 2022-01-11 DIAGNOSIS — Z803 Family history of malignant neoplasm of breast: Secondary | ICD-10-CM | POA: Diagnosis not present

## 2022-01-11 DIAGNOSIS — Z85820 Personal history of malignant melanoma of skin: Secondary | ICD-10-CM | POA: Diagnosis not present

## 2022-01-11 DIAGNOSIS — D509 Iron deficiency anemia, unspecified: Secondary | ICD-10-CM | POA: Insufficient documentation

## 2022-01-11 DIAGNOSIS — Z90710 Acquired absence of both cervix and uterus: Secondary | ICD-10-CM | POA: Insufficient documentation

## 2022-01-11 DIAGNOSIS — R0602 Shortness of breath: Secondary | ICD-10-CM | POA: Diagnosis not present

## 2022-01-11 LAB — CBC WITH DIFFERENTIAL/PLATELET
Abs Immature Granulocytes: 0.03 10*3/uL (ref 0.00–0.07)
Basophils Absolute: 0 10*3/uL (ref 0.0–0.1)
Basophils Relative: 0 %
Eosinophils Absolute: 0 10*3/uL (ref 0.0–0.5)
Eosinophils Relative: 1 %
HCT: 25 % — ABNORMAL LOW (ref 36.0–46.0)
Hemoglobin: 7.8 g/dL — ABNORMAL LOW (ref 12.0–15.0)
Immature Granulocytes: 1 %
Lymphocytes Relative: 13 %
Lymphs Abs: 0.7 10*3/uL (ref 0.7–4.0)
MCH: 27.5 pg (ref 26.0–34.0)
MCHC: 31.2 g/dL (ref 30.0–36.0)
MCV: 88 fL (ref 80.0–100.0)
Monocytes Absolute: 0.2 10*3/uL (ref 0.1–1.0)
Monocytes Relative: 3 %
Neutro Abs: 4.1 10*3/uL (ref 1.7–7.7)
Neutrophils Relative %: 82 %
Platelets: 240 10*3/uL (ref 150–400)
RBC: 2.84 MIL/uL — ABNORMAL LOW (ref 3.87–5.11)
RDW: 15.5 % (ref 11.5–15.5)
WBC: 5 10*3/uL (ref 4.0–10.5)
nRBC: 0 % (ref 0.0–0.2)

## 2022-01-11 LAB — VITAMIN B12: Vitamin B-12: 165 pg/mL — ABNORMAL LOW (ref 180–914)

## 2022-01-11 LAB — FERRITIN: Ferritin: 6 ng/mL — ABNORMAL LOW (ref 11–307)

## 2022-01-11 LAB — IRON AND TIBC
Iron: 21 ug/dL — ABNORMAL LOW (ref 28–170)
Saturation Ratios: 3 % — ABNORMAL LOW (ref 10.4–31.8)
TIBC: 648 ug/dL — ABNORMAL HIGH (ref 250–450)
UIBC: 627 ug/dL

## 2022-01-11 LAB — SAMPLE TO BLOOD BANK

## 2022-01-11 LAB — FOLATE: Folate: 11 ng/mL (ref >5.9)

## 2022-01-11 NOTE — Progress Notes (Signed)
Patient here today for initial visit regarding anemia. Patient reports shortness of breath and fatigue.

## 2022-01-11 NOTE — Progress Notes (Addendum)
[place]  [phone] [phone]  ID: [name] [name] OB: 08-Nov-1935  MR#: [id number]  HDQ#:[id number]  Patient Care Team: [name], NP as PCP - General (Internal Medicine) Minna Merritts, MD as PCP - Cardiology (Cardiology)  REFERRING PROVIDER: Alma Friendly, NP  REASON FOR REFERRAL: IDA  HPI: [name] [name] is a 86 y.o. female with past medical history of hyperlipidemia, GERD, anxiety, fibromyalgia, osteoarthritis melanoma, MR was referred to hematology clinic for further management of iron deficiency anemia.  Patient reports shortness of breath on exertion for 1 month.  She is scheduled for echocardiogram.  She has occasional dizziness.  She has constant headache for 2 weeks.  She has visual disturbance with seeing wavy lines however that is chronic.  She was started on prednisone for 5 days by her primary.  Denies any nausea, vomiting, blurry vision or double vision.  Reports mild improvement with prednisone.  She denies any bleeding in stools or urine, gum or nose bleeding.  She has history of hysterectomy due to excessive bleeding in the past.  Denies any gastric bypass surgery.  She had right knee replacement in June 2023 when she required 1 unit of blood transfusion.  She was also started on oral iron which could not tolerate because of severe nausea.  She last had colonoscopy and endoscopy more than 15 years ago.  Labs reviewed. CBC from 01/08/2022 showed WBC 3.8, hemoglobin 7.7 and platelets 234.  From 10/10/2021 hemoglobin 10.7.  Iron panel showed ferritin 29, saturation 8.7%, TIBC elevated 599.   REVIEW OF SYSTEMS:   ROS  As per HPI. Otherwise, a complete review of systems is negative.  PAST MEDICAL HISTORY: Past Medical History:  Diagnosis Date   Anxiety    prev on prozac   Chest pain    Felt to be noncardiac in the past   Chronic rectal fissure    Colon polyp    Dyslipidemia    Fibromyalgia    GERD  (gastroesophageal reflux disease)    Barrett's esophagus   Hiatal hernia    Lumbar spine pain    Complex lumbar spine surgery at [facility], 1194, complicated, MRSA, much of the appliance is removed, patient on chronic antibiotics   Melanoma ([facility])    Mitral regurgitation    a. 2007 Echo Mild MR; b. 10/2016 Echo: EF 55-60%, no rwma, GrI DD, Mild MR.   Osteoarthritis    Scoliosis    Sinusitis    Multiple sinus operations in the past   Statin intolerance    As of 2009, no further attempts to use statins   Thyroid nodule     PAST SURGICAL HISTORY: Past Surgical History:  Procedure Laterality Date   ABDOMINAL HYSTERECTOMY     BACK SURGERY     CHOLECYSTECTOMY     FOOT SURGERY     HERNIA REPAIR     KNEE SURGERY Right 08/08/2021   total knee replacement   NASAL SINUS SURGERY      FAMILY HISTORY: Family History  Problem Relation Age of Onset   GER disease Mother    Dementia Mother    Breast cancer Mother    Heart disease Father        MI at 80   Scoliosis Daughter    Colon cancer Neg Hx     HEALTH MAINTENANCE: Social History   Tobacco Use   Smoking status: Never    Passive exposure: Never   Smokeless tobacco: Never  Vaping Use  Vaping Use: Never used  Substance Use Topics   Alcohol use: No   Drug use: No     Allergies  Allergen Reactions   Other Other (See Comments)    Other Reaction: when taking for an extended period of time causes mouth ulcers and esophagus irritation Other Reaction: when taking for an extended period of time causes mouth ulcers and esophagus irritation    Fenofibric Acid Other (See Comments)    GI upset   Nsaids     REACTION: No long term use   Statins Other (See Comments)    Intolerant, myalgias Intolerant, myalgias Intolerant, myalgias Intolerant, myalgias Intolerant, myalgias Intolerant, myalgias    Trilipix [Choline Fenofibrate]     GI upset   Wasp Venom Swelling    Current Outpatient Medications  Medication Sig Dispense  Refill   ALPHA LIPOIC ACID PO Take 1 tablet by mouth daily. Daily     calcium carbonate (OS-CAL) 600 MG TABS Take 1,200 mg by mouth 2 (two) times daily with a meal.     cholecalciferol (VITAMIN D) 1000 UNITS tablet Take 1,000 Units by mouth daily.     clotrimazole-betamethasone (LOTRISONE) cream APPLY TOPICALLY 2 (TWO) TIMES DAILY AS NEEDED. 30 g 0   diclofenac sodium (VOLTAREN) 1 % GEL Apply 2-4 grams to affected area up to 4 times daily 4 Tube 2   fenofibrate micronized (LOFIBRA) 134 MG capsule TAKE 1 CAPSULE (134 MG TOTAL) BY MOUTH AT BEDTIME. FOR CHOLESTEROL. 90 capsule 2   fish oil-omega-3 fatty acids 1000 MG capsule Take 2 g by mouth 2 (two) times daily.     gabapentin (NEURONTIN) 100 MG capsule TAKE 1 CAPSULE BY MOUTH THREE TIMES A DAY 270 capsule 0   Ginger, Zingiber officinalis, (GINGER PO) Take 1 tablet by mouth daily.     levothyroxine (SYNTHROID) 50 MCG tablet TAKE 1 TABLET BY MOUTH EVERY MORNING WITH WATER ON AN EMPTY STOMACH. NO OTHER FOODS/MEDS FOR 30 MINS 90 tablet 2   metoprolol tartrate (LOPRESSOR) 25 MG tablet TAKE 1 TAB BY MOUTH TWICE A DAY AND EXTRA TABLET AS NEEDED FOR BREAKTHROUGH PALPITATIONS AS DIRECTED 270 tablet 2   NON FORMULARY Take 1 capsule by mouth daily. Tumeric      Daily     omeprazole (PRILOSEC) 20 MG capsule TAKE 1 CAPSULE (20 MG TOTAL) BY MOUTH DAILY. FOR HEARTBURN. 90 capsule 2   predniSONE (DELTASONE) 20 MG tablet Take 2 tablets by mouth once daily in the morning for 5 days. 10 tablet 0   sulfamethoxazole-trimethoprim (BACTRIM DS) 800-160 MG tablet Take 1 tablet by mouth 2 (two) times daily.     tretinoin (RETIN-A) 0.05 % cream Apply 1 application topically daily as needed (as directed per dermatologist).   3   No current facility-administered medications for this visit.    OBJECTIVE: Vitals:   01/11/22 1029  BP: 114/63  Pulse: 74  Resp: 18  Temp: 98.1 F (36.7 C)  SpO2: 97%     Body mass index is 33.76 kg/m.      General: Well-developed,  well-nourished, no acute distress. Eyes: Pink conjunctiva, anicteric sclera. HEENT: Normocephalic, moist mucous membranes, clear oropharnyx. Lungs: Clear to auscultation bilaterally. Heart: Regular rate and rhythm. No rubs, murmurs, or gallops. Abdomen: Soft, nontender, nondistended. No organomegaly noted, normoactive bowel sounds. Musculoskeletal: No edema, cyanosis, or clubbing. Neuro: Alert, answering all questions appropriately. Cranial nerves grossly intact. Skin: No rashes or petechiae noted. Psych: Normal affect. Lymphatics: No cervical, calvicular, axillary or inguinal LAD.  LAB RESULTS:  Lab Results  Component Value Date   NA 140 10/10/2021   K 4.2 10/10/2021   CL 104 10/10/2021   CO2 26 10/10/2021   GLUCOSE 101 (H) 10/10/2021   BUN 23 10/10/2021   CREATININE 0.97 10/10/2021   CALCIUM 9.1 10/10/2021   PROT 6.4 09/19/2020   ALBUMIN 4.5 09/19/2020   AST 18 09/19/2020   ALT 12 09/19/2020   ALKPHOS 50 09/19/2020   BILITOT 0.5 09/19/2020    Lab Results  Component Value Date   WBC 3.8 (L) 01/08/2022   NEUTROABS 5.5 08/22/2021   HGB 7.7 Repeated and verified X2. (LL) 01/08/2022   HCT 24.5 (L) 01/08/2022   MCV 86.7 01/08/2022   PLT 234.0 01/08/2022    Lab Results  Component Value Date   TIBC 599.2 (H) 10/10/2021   FERRITIN 29.3 10/10/2021   IRONPCTSAT 8.7 (L) 10/10/2021     STUDIES: DG Chest 2 View  Result Date: 01/08/2022 CLINICAL DATA:  Exertional dyspnea. EXAM: CHEST - 2 VIEW COMPARISON:  03/10/2010 FINDINGS: The heart size is within normal limits. Stable mild tortuosity of the thoracic aorta. Stable elevation of the right hemidiaphragm. There is no evidence of pulmonary edema, consolidation, pneumothorax, nodule or pleural fluid. Stable appearance of partially visualized thoracolumbar spinal fusion hardware. IMPRESSION: No active cardiopulmonary disease. Electronically Signed   By: Aletta Edouard M.D.   On: 01/08/2022 13:33    ASSESSMENT AND PLAN:    [name] [name] is a 86 y.o. female with pmh of hyperlipidemia, GERD, anxiety, fibromyalgia, osteoarthritis melanoma, MR was referred to hematology clinic for further management of iron deficiency anemia.  #Iron deficiency anemia #Shortness of breath -Progressive in nature.  Could not tolerate oral iron due to nausea. -Labs reviewed. CBC from 01/08/2022 showed WBC 3.8, hemoglobin 7.7 and platelets 234.  From 10/10/2021 hemoglobin 10.7.  Iron panel showed ferritin 29, saturation 8.7%, TIBC elevated 599.Hb trending down over past 1 year.  -Denies any bleeding. -Discussed about IV Feraheme 550 mg weekly x2.  Low but potential risk of anaphylactic reaction was discussed. -She last had colonoscopy and endoscopy about 15 years ago.  Obtain stool cards. She has sob on exertion for a month which is stable.  Would like to restore her iron and hemoglobin first and then will make a referral to GI for further evaluation. -labs today as below. - she is also scheduled for echo.  # Headaches -Started on short course prednisone for 5 days by PCP.  Mild improvement. -Briefly discussed that if headache persist she would need an MRI brain and referral to neurologist.  She will discuss with her primary.  Orders Placed This Encounter  Procedures   CBC with Differential   Vitamin B12   Folate   Iron and TIBC([facility])   Ferritin   CBC with Differential   Iron and TIBC([facility])   Ferritin   Occult blood card to lab, stool   Hold Tube- Blood Bank   RTC in 8 weeks for MD visit, labs prior  Patient expressed understanding and was in agreement with this plan. She also understands that She can call clinic at any time with any questions, concerns, or complaints.   I spent a total of 45 minutes reviewing chart data, face-to-face evaluation with the patient, counseling and coordination of care as detailed above.  Jane Canary, MD   01/11/2022 11:11 AM

## 2022-01-14 ENCOUNTER — Other Ambulatory Visit: Payer: Self-pay | Admitting: Internal Medicine

## 2022-01-14 ENCOUNTER — Other Ambulatory Visit: Payer: Self-pay | Admitting: *Deleted

## 2022-01-14 ENCOUNTER — Ambulatory Visit
Admission: RE | Admit: 2022-01-14 | Discharge: 2022-01-14 | Disposition: A | Discharge status: Home / Self Care | Payer: Medicare Other | Source: Ambulatory Visit | Attending: Primary Care | Admitting: Primary Care

## 2022-01-14 ENCOUNTER — Ambulatory Visit: Payer: Medicare Other | Admitting: Orthopaedic Surgery

## 2022-01-14 DIAGNOSIS — R079 Chest pain, unspecified: Secondary | ICD-10-CM | POA: Insufficient documentation

## 2022-01-14 DIAGNOSIS — R0609 Other forms of dyspnea: Secondary | ICD-10-CM | POA: Diagnosis not present

## 2022-01-14 DIAGNOSIS — I340 Nonrheumatic mitral (valve) insufficiency: Secondary | ICD-10-CM | POA: Insufficient documentation

## 2022-01-14 LAB — ECHOCARDIOGRAM COMPLETE
AR max vel: 2.95 cm2
AV Area VTI: 3.01 cm2
AV Area mean vel: 2.84 cm2
AV Mean grad: 4 mmHg
AV Peak grad: 6.3 mmHg
Ao pk vel: 1.26 m/s
Area-P 1/2: 4.26 cm2
S' Lateral: 2 cm

## 2022-01-14 NOTE — Progress Notes (Signed)
*  PRELIMINARY RESULTS* Echocardiogram 2D Echocardiogram has been performed.  [name] [name] 01/14/2022, 9:49 AM

## 2022-01-14 NOTE — Progress Notes (Signed)
B12 low 165.  I called the patient and informed her. Advised cyanacobalamin IM 1000 mcg weekly x 4. She was agreeable.   Will schedule.

## 2022-01-15 ENCOUNTER — Telehealth: Payer: Self-pay | Admitting: Primary Care

## 2022-01-15 DIAGNOSIS — Z79899 Other long term (current) drug therapy: Secondary | ICD-10-CM | POA: Diagnosis not present

## 2022-01-15 DIAGNOSIS — D509 Iron deficiency anemia, unspecified: Secondary | ICD-10-CM | POA: Diagnosis present

## 2022-01-15 DIAGNOSIS — E538 Deficiency of other specified B group vitamins: Secondary | ICD-10-CM | POA: Insufficient documentation

## 2022-01-15 NOTE — Telephone Encounter (Signed)
See result note for further documentation.

## 2022-01-15 NOTE — Telephone Encounter (Signed)
Patient called in returning a call she received. Thank you!

## 2022-01-16 DIAGNOSIS — D509 Iron deficiency anemia, unspecified: Secondary | ICD-10-CM | POA: Diagnosis not present

## 2022-01-16 LAB — MUSK ANTIBODIES

## 2022-01-17 DIAGNOSIS — D509 Iron deficiency anemia, unspecified: Secondary | ICD-10-CM | POA: Diagnosis not present

## 2022-01-18 ENCOUNTER — Inpatient Hospital Stay: Payer: Medicare Other | Attending: Internal Medicine

## 2022-01-18 ENCOUNTER — Other Ambulatory Visit: Payer: Self-pay

## 2022-01-18 VITALS — BP 125/54 | HR 72 | Temp 97.5°F | Resp 16

## 2022-01-18 DIAGNOSIS — Z79899 Other long term (current) drug therapy: Secondary | ICD-10-CM | POA: Diagnosis not present

## 2022-01-18 DIAGNOSIS — E538 Deficiency of other specified B group vitamins: Secondary | ICD-10-CM | POA: Diagnosis not present

## 2022-01-18 DIAGNOSIS — D509 Iron deficiency anemia, unspecified: Secondary | ICD-10-CM | POA: Diagnosis not present

## 2022-01-18 LAB — OCCULT BLOOD X 1 CARD TO LAB, STOOL
Fecal Occult Bld: NEGATIVE
Fecal Occult Bld: NEGATIVE
Fecal Occult Bld: NEGATIVE

## 2022-01-18 MED ORDER — SODIUM CHLORIDE 0.9 % IV SOLN
510.0000 mg | Freq: Once | INTRAVENOUS | Status: AC
  Administered 2022-01-18: 510 mg via INTRAVENOUS
  Filled 2022-01-18: qty 510

## 2022-01-18 MED ORDER — CYANOCOBALAMIN 1000 MCG/ML IJ SOLN
1000.0000 ug | INTRAMUSCULAR | Status: DC
  Administered 2022-01-18: 1000 ug via INTRAMUSCULAR

## 2022-01-18 MED ORDER — SODIUM CHLORIDE 0.9 % IV SOLN
Freq: Once | INTRAVENOUS | Status: AC
  Filled 2022-01-18: qty 250

## 2022-01-20 LAB — MUSK ANTIBODIES: MuSK Antibodies: <1 U/mL

## 2022-01-20 LAB — ACHR ABS WITH REFLEX TO MUSK: AChR Binding Ab, Serum: <0.03 nmol/L (ref 0.00–0.24)

## 2022-01-25 ENCOUNTER — Inpatient Hospital Stay: Payer: Medicare Other

## 2022-01-25 VITALS — BP 112/55 | HR 76 | Temp 98.4°F | Resp 19

## 2022-01-25 DIAGNOSIS — D509 Iron deficiency anemia, unspecified: Secondary | ICD-10-CM

## 2022-01-25 DIAGNOSIS — Z79899 Other long term (current) drug therapy: Secondary | ICD-10-CM | POA: Diagnosis not present

## 2022-01-25 DIAGNOSIS — E538 Deficiency of other specified B group vitamins: Secondary | ICD-10-CM | POA: Diagnosis not present

## 2022-01-25 MED ORDER — SODIUM CHLORIDE 0.9 % IV SOLN
510.0000 mg | Freq: Once | INTRAVENOUS | Status: AC
  Administered 2022-01-25: 510 mg via INTRAVENOUS
  Filled 2022-01-25: qty 510

## 2022-01-25 MED ORDER — SODIUM CHLORIDE 0.9 % IV SOLN
Freq: Once | INTRAVENOUS | Status: AC
  Filled 2022-01-25: qty 250

## 2022-01-25 MED ORDER — CYANOCOBALAMIN 1000 MCG/ML IJ SOLN
1000.0000 ug | INTRAMUSCULAR | Status: DC
  Administered 2022-01-25: 1000 ug via INTRAMUSCULAR
  Filled 2022-01-25: qty 1

## 2022-01-25 NOTE — Patient Instructions (Signed)
[facility] AT [place]  Discharge Instructions: Thank you for choosing [facility] to provide your oncology and hematology care.  If you have a lab appointment with the [facility], please go directly to the [facility] and check in at the registration area.  Wear comfortable clothing and clothing appropriate for easy access to any Portacath or PICC line.   We strive to give you quality time with your provider. You may need to reschedule your appointment if you arrive late (15 or more minutes).  Arriving late affects you and other patients whose appointments are after yours.  Also, if you miss three or more appointments without notifying the office, you may be dismissed from the clinic at the provider's discretion.      For prescription refill requests, have your pharmacy contact our office and allow 72 hours for refills to be completed.    Today you received the following chemotherapy and/or immunotherapy agents B-12 and Feraheme.      To help prevent nausea and vomiting after your treatment, we encourage you to take your nausea medication as directed.  BELOW ARE SYMPTOMS THAT SHOULD BE REPORTED IMMEDIATELY: *FEVER GREATER THAN 100.4 F (38 C) OR HIGHER *CHILLS OR SWEATING *NAUSEA AND VOMITING THAT IS NOT CONTROLLED WITH YOUR NAUSEA MEDICATION *UNUSUAL SHORTNESS OF BREATH *UNUSUAL BRUISING OR BLEEDING *URINARY PROBLEMS (pain or burning when urinating, or frequent urination) *BOWEL PROBLEMS (unusual diarrhea, constipation, pain near the anus) TENDERNESS IN MOUTH AND THROAT WITH OR WITHOUT PRESENCE OF ULCERS (sore throat, sores in mouth, or a toothache) UNUSUAL RASH, SWELLING OR PAIN  UNUSUAL VAGINAL DISCHARGE OR ITCHING   Items with * indicate a potential emergency and should be followed up as soon as possible or go to the Emergency Department if any problems should occur.  Please show the CHEMOTHERAPY ALERT CARD or IMMUNOTHERAPY ALERT CARD at  check-in to the Emergency Department and triage nurse.  Should you have questions after your visit or need to cancel or reschedule your appointment, please contact [facility] CANC[facility] [place] AT [place]  [phone] and follow the prompts.  Office hours are 8:00 a.m. to 4:30 p.m. Monday - Friday. Please note that voicemails left after 4:00 p.m. may not be returned until the following business day.  We are closed weekends and major holidays. You have access to a nurse at all times for urgent questions. Please call the main number to the clinic [phone] and follow the prompts.  For any non-urgent questions, you may also contact your provider using MyChart. We now offer e-Visits for anyone 66 and older to request care online for non-urgent symptoms. For details visit [email or web address].   Also download the MyChart app! Go to the app store, search "MyChart", open the app, select [facility], and log in with your MyChart username and password.  Masks are optional in the cancer centers. If you would like for your care team to wear a mask while they are taking care of you, please let them know. For doctor visits, patients may have with them one support person who is at least 84 years old. At this time, visitors are not allowed in the infusion area.

## 2022-01-30 ENCOUNTER — Ambulatory Visit (INDEPENDENT_AMBULATORY_CARE_PROVIDER_SITE_OTHER): Payer: Medicare Other | Admitting: Orthopaedic Surgery

## 2022-01-30 ENCOUNTER — Encounter: Payer: Self-pay | Admitting: Orthopaedic Surgery

## 2022-01-30 DIAGNOSIS — G8929 Other chronic pain: Secondary | ICD-10-CM | POA: Diagnosis not present

## 2022-01-30 DIAGNOSIS — M25562 Pain in left knee: Secondary | ICD-10-CM | POA: Diagnosis not present

## 2022-01-30 MED ORDER — LIDOCAINE HCL 1 % IJ SOLN
3.0000 mL | INTRAMUSCULAR | Status: AC | PRN
  Administered 2022-01-30: 3 mL

## 2022-01-30 MED ORDER — METHYLPREDNISOLONE ACETATE 40 MG/ML IJ SUSP
40.0000 mg | INTRAMUSCULAR | Status: AC | PRN
  Administered 2022-01-30: 40 mg via INTRA_ARTICULAR

## 2022-01-30 NOTE — Progress Notes (Signed)
[name] [name] is here today requesting a steroid injection in her left knee.  She has known osteoarthritis of the left knee and has been 17-monthsince we injected her knee.  She [place]  She is dealing with significant edema and other health issues.  She is 86years old.  She said the steroid injections do help.  She has had to have iron infusions as well.  Examination of her left knee does show varus malalignment that is correctable.  She has pain throughout the arc of motion her knee and obvious arthritis findings.  She does have arthritis in multiple joints including her hands and fingers.  I did place a steroid injection in her left knee today without difficulty.  She knows to wait at least 3 to 4 months between injections.  All questions and concerns were addressed and answered.      Procedure Note  Patient: [name] [name]            Date of Birth: 112-05-1935          MRN: [id number]            Visit Date: 01/30/2022  Procedures: Visit Diagnoses:  1. Chronic pain of left knee     Large Joint Inj: L knee on 01/30/2022 8:49 AM Indications: diagnostic evaluation and pain Details: 22 G 1.5 in needle, superolateral approach  Arthrogram: No  Medications: 3 mL lidocaine 1 %; 40 mg methylPREDNISolone acetate 40 MG/ML Outcome: tolerated well, no immediate complications Procedure, treatment alternatives, risks and benefits explained, specific risks discussed. Consent was given by the patient. Immediately prior to procedure a time out was called to verify the correct patient, procedure, equipment, support staff and site/side marked as required. Patient was prepped and draped in the usual sterile fashion.

## 2022-02-01 ENCOUNTER — Inpatient Hospital Stay: Payer: Medicare Other

## 2022-02-01 DIAGNOSIS — D509 Iron deficiency anemia, unspecified: Secondary | ICD-10-CM

## 2022-02-01 MED ORDER — CYANOCOBALAMIN 1000 MCG/ML IJ SOLN
1000.0000 ug | INTRAMUSCULAR | Status: DC
  Administered 2022-02-01: 1000 ug via INTRAMUSCULAR
  Filled 2022-02-01: qty 1

## 2022-02-04 ENCOUNTER — Other Ambulatory Visit: Payer: Self-pay | Admitting: Primary Care

## 2022-02-04 DIAGNOSIS — Z1231 Encounter for screening mammogram for malignant neoplasm of breast: Secondary | ICD-10-CM

## 2022-02-08 ENCOUNTER — Inpatient Hospital Stay: Payer: Medicare Other

## 2022-02-08 DIAGNOSIS — D509 Iron deficiency anemia, unspecified: Secondary | ICD-10-CM | POA: Diagnosis not present

## 2022-02-08 MED ORDER — CYANOCOBALAMIN 1000 MCG/ML IJ SOLN
1000.0000 ug | INTRAMUSCULAR | Status: DC
  Administered 2022-02-08: 1000 ug via INTRAMUSCULAR

## 2022-02-12 ENCOUNTER — Other Ambulatory Visit: Payer: Self-pay | Admitting: Nurse Practitioner

## 2022-02-21 DIAGNOSIS — H43813 Vitreous degeneration, bilateral: Secondary | ICD-10-CM | POA: Diagnosis not present

## 2022-03-10 ENCOUNTER — Other Ambulatory Visit: Payer: Self-pay | Admitting: Orthopaedic Surgery

## 2022-03-11 ENCOUNTER — Inpatient Hospital Stay (HOSPITAL_BASED_OUTPATIENT_CLINIC_OR_DEPARTMENT_OTHER): Payer: Medicare Other | Admitting: Internal Medicine

## 2022-03-11 ENCOUNTER — Encounter: Payer: Self-pay | Admitting: Internal Medicine

## 2022-03-11 ENCOUNTER — Other Ambulatory Visit: Payer: Self-pay | Admitting: Cardiovascular Disease

## 2022-03-11 ENCOUNTER — Inpatient Hospital Stay: Payer: Medicare Other | Attending: Internal Medicine

## 2022-03-11 VITALS — BP 113/50 | HR 70 | Temp 98.7°F | Resp 19 | Wt 158.4 lb

## 2022-03-11 DIAGNOSIS — H539 Unspecified visual disturbance: Secondary | ICD-10-CM | POA: Diagnosis not present

## 2022-03-11 DIAGNOSIS — E538 Deficiency of other specified B group vitamins: Secondary | ICD-10-CM

## 2022-03-11 DIAGNOSIS — D509 Iron deficiency anemia, unspecified: Secondary | ICD-10-CM | POA: Diagnosis not present

## 2022-03-11 DIAGNOSIS — Z803 Family history of malignant neoplasm of breast: Secondary | ICD-10-CM | POA: Diagnosis not present

## 2022-03-11 DIAGNOSIS — Z90710 Acquired absence of both cervix and uterus: Secondary | ICD-10-CM | POA: Insufficient documentation

## 2022-03-11 DIAGNOSIS — R0609 Other forms of dyspnea: Secondary | ICD-10-CM | POA: Diagnosis not present

## 2022-03-11 DIAGNOSIS — R0602 Shortness of breath: Secondary | ICD-10-CM | POA: Insufficient documentation

## 2022-03-11 DIAGNOSIS — D508 Other iron deficiency anemias: Secondary | ICD-10-CM | POA: Diagnosis not present

## 2022-03-11 DIAGNOSIS — Z96651 Presence of right artificial knee joint: Secondary | ICD-10-CM | POA: Insufficient documentation

## 2022-03-11 DIAGNOSIS — Z85820 Personal history of malignant melanoma of skin: Secondary | ICD-10-CM | POA: Insufficient documentation

## 2022-03-11 LAB — IRON AND TIBC
Iron: 66 ug/dL (ref 28–170)
Saturation Ratios: 13 % (ref 10.4–31.8)
TIBC: 498 ug/dL — ABNORMAL HIGH (ref 250–450)
UIBC: 432 ug/dL

## 2022-03-11 LAB — CBC WITH DIFFERENTIAL/PLATELET
Abs Immature Granulocytes: 0.02 10*3/uL (ref 0.00–0.07)
Basophils Absolute: 0 10*3/uL (ref 0.0–0.1)
Basophils Relative: 1 %
Eosinophils Absolute: 0.1 10*3/uL (ref 0.0–0.5)
Eosinophils Relative: 2 %
HCT: 34.2 % — ABNORMAL LOW (ref 36.0–46.0)
Hemoglobin: 10.7 g/dL — ABNORMAL LOW (ref 12.0–15.0)
Immature Granulocytes: 0 %
Lymphocytes Relative: 25 %
Lymphs Abs: 1.2 10*3/uL (ref 0.7–4.0)
MCH: 30.1 pg (ref 26.0–34.0)
MCHC: 31.3 g/dL (ref 30.0–36.0)
MCV: 96.1 fL (ref 80.0–100.0)
Monocytes Absolute: 0.4 10*3/uL (ref 0.1–1.0)
Monocytes Relative: 10 %
Neutro Abs: 2.9 10*3/uL (ref 1.7–7.7)
Neutrophils Relative %: 62 %
Platelets: 208 10*3/uL (ref 150–400)
RBC: 3.56 MIL/uL — ABNORMAL LOW (ref 3.87–5.11)
RDW: 17.9 % — ABNORMAL HIGH (ref 11.5–15.5)
WBC: 4.6 10*3/uL (ref 4.0–10.5)
nRBC: 0 % (ref 0.0–0.2)

## 2022-03-11 LAB — FERRITIN: Ferritin: 37 ng/mL (ref 11–307)

## 2022-03-11 NOTE — Progress Notes (Signed)
Patient states she is still a little short of breathe.

## 2022-03-11 NOTE — Progress Notes (Addendum)
[facility]  [phone] [phone]  ID: BAILEE METTER OB: October 23, 1935  MR#: [id number]  DXA#:[id number]  Patient Care Team: [name], NP as PCP - General (Internal Medicine) Minna Merritts, MD as PCP - Cardiology (Cardiology)  REFERRING PROVIDER: Alma Friendly, NP  REASON FOR REFERRAL: IDA  HPI: STESHA NEYENS is a 86 y.o. female with past medical history of hyperlipidemia, GERD, anxiety, fibromyalgia, osteoarthritis melanoma, MR was referred to hematology clinic for further management of iron deficiency anemia.  Patient reports shortness of breath on exertion for 1 month.  She is scheduled for echocardiogram.  She has occasional dizziness.  She has constant headache for 2 weeks.  She has visual disturbance with seeing wavy lines however that is chronic.  She was started on prednisone for 5 days by her primary.  Denies any nausea, vomiting, blurry vision or double vision.  Reports mild improvement with prednisone.  She denies any bleeding in stools or urine, gum or nose bleeding.  She has history of hysterectomy due to excessive bleeding in the past.  Denies any gastric bypass surgery.  She had right knee replacement in June 2023 when she required 1 unit of blood transfusion.  She was also started on oral iron which could not tolerate because of severe nausea.  She last had colonoscopy and endoscopy more than 15 years ago.  Labs reviewed. CBC from 01/08/2022 showed WBC 3.8, hemoglobin 7.7 and platelets 234.  From 10/10/2021 hemoglobin 10.7.  Iron panel showed ferritin 29, saturation 8.7%, TIBC elevated 599.  Interval history Patient was seen today accompanied by daughter to discuss labs. She continues to feel short of breath on exertion but notes improvement.  Her fatigue has improved.  Denies any chest pain.  Denies any bleeding.   REVIEW OF SYSTEMS:   Review of Systems  Respiratory:  Positive for shortness of breath.     As per HPI.  Otherwise, a complete review of systems is negative.  PAST MEDICAL HISTORY: Past Medical History:  Diagnosis Date   Anxiety    prev on prozac   Chest pain    Felt to be noncardiac in the past   Chronic rectal fissure    Colon polyp    Dyslipidemia    Fibromyalgia    GERD (gastroesophageal reflux disease)    Barrett's esophagus   Hiatal hernia    Lumbar spine pain    Complex lumbar spine surgery at [place], 2094, complicated, MRSA, much of the appliance is removed, patient on chronic antibiotics   Melanoma ([place])    Mitral regurgitation    a. 2007 Echo Mild MR; b. 10/2016 Echo: EF 55-60%, no rwma, GrI DD, Mild MR.   Osteoarthritis    Scoliosis    Sinusitis    Multiple sinus operations in the past   Statin intolerance    As of 2009, no further attempts to use statins   Thyroid nodule     PAST SURGICAL HISTORY: Past Surgical History:  Procedure Laterality Date   ABDOMINAL HYSTERECTOMY     BACK SURGERY     CHOLECYSTECTOMY     FOOT SURGERY     HERNIA REPAIR     KNEE SURGERY Right 08/08/2021   total knee replacement   NASAL SINUS SURGERY      FAMILY HISTORY: Family History  Problem Relation Age of Onset   GER disease Mother    Dementia Mother    Breast cancer Mother    Heart disease Father  MI at 17   Scoliosis Daughter    Colon cancer Neg Hx     HEALTH MAINTENANCE: Social History   Tobacco Use   Smoking status: Never    Passive exposure: Never   Smokeless tobacco: Never  Vaping Use   Vaping Use: Never used  Substance Use Topics   Alcohol use: No   Drug use: No     Allergies  Allergen Reactions   Other Other (See Comments)    Other Reaction: when taking for an extended period of time causes mouth ulcers and esophagus irritation Other Reaction: when taking for an extended period of time causes mouth ulcers and esophagus irritation    Fenofibric Acid Other (See Comments)    GI upset   Nsaids     REACTION: No long term use   Statins Other (See  Comments)    Intolerant, myalgias Intolerant, myalgias Intolerant, myalgias Intolerant, myalgias Intolerant, myalgias Intolerant, myalgias    Trilipix [Choline Fenofibrate]     GI upset   Wasp Venom Swelling    Current Outpatient Medications  Medication Sig Dispense Refill   ALPHA LIPOIC ACID PO Take 1 tablet by mouth daily. Daily     calcium carbonate (OS-CAL) 600 MG TABS Take 1,200 mg by mouth 2 (two) times daily with a meal.     cholecalciferol (VITAMIN D) 1000 UNITS tablet Take 1,000 Units by mouth daily.     clotrimazole-betamethasone (LOTRISONE) cream APPLY TOPICALLY 2 (TWO) TIMES DAILY AS NEEDED. 30 g 0   diclofenac sodium (VOLTAREN) 1 % GEL Apply 2-4 grams to affected area up to 4 times daily 4 Tube 2   fenofibrate micronized (LOFIBRA) 134 MG capsule TAKE 1 CAPSULE (134 MG TOTAL) BY MOUTH AT BEDTIME. FOR CHOLESTEROL. 90 capsule 2   fish oil-omega-3 fatty acids 1000 MG capsule Take 2 g by mouth 2 (two) times daily.     gabapentin (NEURONTIN) 100 MG capsule TAKE 1 CAPSULE BY MOUTH THREE TIMES A DAY 270 capsule 0   Ginger, Zingiber officinalis, (GINGER PO) Take 1 tablet by mouth daily.     levothyroxine (SYNTHROID) 50 MCG tablet TAKE 1 TABLET BY MOUTH EVERY MORNING WITH WATER ON AN EMPTY STOMACH. NO OTHER FOODS/MEDS FOR 30 MINS 90 tablet 2   metoprolol tartrate (LOPRESSOR) 25 MG tablet TAKE 1 TAB BY MOUTH TWICE A DAY AND EXTRA TABLET AS NEEDED FOR BREAKTHROUGH PALPITATIONS AS DIRECTED. Please contact our office to schedule a follow up appointment, thank you. [phone] 90 tablet 0   NON FORMULARY Take 1 capsule by mouth daily. Tumeric      Daily     omeprazole (PRILOSEC) 20 MG capsule TAKE 1 CAPSULE (20 MG TOTAL) BY MOUTH DAILY. FOR HEARTBURN. 90 capsule 2   predniSONE (DELTASONE) 20 MG tablet Take 2 tablets by mouth once daily in the morning for 5 days. 10 tablet 0   sulfamethoxazole-trimethoprim (BACTRIM DS) 800-160 MG tablet Take 1 tablet by mouth 2 (two) times daily.      tretinoin (RETIN-A) 0.05 % cream Apply 1 application topically daily as needed (as directed per dermatologist).   3   No current facility-administered medications for this visit.    OBJECTIVE: Vitals:   03/11/22 1022  BP: (!) 113/50  Pulse: 70  Resp: 19  Temp: 98.7 F (37.1 C)  SpO2: 97%     Body mass index is 34.28 kg/m.      General: Well-developed, well-nourished, no acute distress. Eyes: Pink conjunctiva, anicteric sclera. HEENT: Normocephalic, moist mucous membranes,  clear oropharnyx. Lungs: Clear to auscultation bilaterally. Heart: Regular rate and rhythm. No rubs, murmurs, or gallops. Abdomen: Soft, nontender, nondistended. No organomegaly noted, normoactive bowel sounds. Musculoskeletal: No edema, cyanosis, or clubbing. Neuro: Alert, answering all questions appropriately. Cranial nerves grossly intact. Skin: No rashes or petechiae noted. Psych: Normal affect. Lymphatics: No cervical, calvicular, axillary or inguinal LAD.   LAB RESULTS:  Lab Results  Component Value Date   NA 140 10/10/2021   K 4.2 10/10/2021   CL 104 10/10/2021   CO2 26 10/10/2021   GLUCOSE 101 (H) 10/10/2021   BUN 23 10/10/2021   CREATININE 0.97 10/10/2021   CALCIUM 9.1 10/10/2021   PROT 6.4 09/19/2020   ALBUMIN 4.5 09/19/2020   AST 18 09/19/2020   ALT 12 09/19/2020   ALKPHOS 50 09/19/2020   BILITOT 0.5 09/19/2020    Lab Results  Component Value Date   WBC 4.6 03/11/2022   NEUTROABS 2.9 03/11/2022   HGB 10.7 (L) 03/11/2022   HCT 34.2 (L) 03/11/2022   MCV 96.1 03/11/2022   PLT 208 03/11/2022    Lab Results  Component Value Date   TIBC 498 (H) 03/11/2022   TIBC 648 (H) 01/11/2022   TIBC 599.2 (H) 10/10/2021   FERRITIN 37 03/11/2022   FERRITIN 6 (L) 01/11/2022   FERRITIN 29.3 10/10/2021   IRONPCTSAT 13 03/11/2022   IRONPCTSAT 3 (L) 01/11/2022   IRONPCTSAT 8.7 (L) 10/10/2021     STUDIES: No results found.  ASSESSMENT AND PLAN:   NINOSHKA WAINWRIGHT is a 86 y.o. female with  pmh of hyperlipidemia, GERD, anxiety, fibromyalgia, osteoarthritis melanoma, MR was referred to hematology clinic for further management of iron deficiency anemia.  #Iron deficiency anemia -Could not tolerate oral iron due to nausea.  Ferritin of 6.  Completed IV Feraheme x 2 on 02/10/2022.  Hemoglobin improved from 7.7 to 10.7.  Iron studies showed improvement in saturation to 13%.  Will schedule for another dose of IV Feraheme.  I will make GI referral to assess for any ongoing bleeding.  Stool cards x 3 negative.  #Shortness of breath -Improvement but not resolved.  Echo showed normal EF.  Chest x-ray was unremarkable.  Now that her hemoglobin has improved to 10.7 it is less likely to be contributing to her shortness of breath.  I will order CT chest to rule out any lung pathology.  # Vitamin B12 deficiency -B12 from September 2023 165. S/p IM cyanocobalamin 1000 mcg X 4 -On B12 supplements 1000 mcg.  Will recheck level with next visit.  Orders Placed This Encounter  Procedures   CT CHEST WO CONTRAST   CBC with Differential   Iron and TIBC   Ferritin   Vitamin B12   Ambulatory referral to Gastroenterology   RTC in 8 weeks for MD visit, labs prior  Patient expressed understanding and was in agreement with this plan. She also understands that She can call clinic at any time with any questions, concerns, or complaints.   I spent a total of 30 minutes reviewing chart data, face-to-face evaluation with the patient, counseling and coordination of care as detailed above.  Jane Canary, MD   03/11/2022 11:26 AM

## 2022-03-13 ENCOUNTER — Encounter: Payer: Self-pay | Admitting: Internal Medicine

## 2022-03-13 NOTE — Addendum Note (Signed)
Addended byJane Canary on: 03/13/2022 08:50 AM   Modules accepted: Orders

## 2022-03-25 ENCOUNTER — Inpatient Hospital Stay: Payer: Medicare Other | Attending: Internal Medicine

## 2022-03-25 VITALS — BP 147/64 | HR 69 | Temp 96.7°F | Resp 18

## 2022-03-25 DIAGNOSIS — D509 Iron deficiency anemia, unspecified: Secondary | ICD-10-CM | POA: Insufficient documentation

## 2022-03-25 DIAGNOSIS — E538 Deficiency of other specified B group vitamins: Secondary | ICD-10-CM | POA: Insufficient documentation

## 2022-03-25 MED ORDER — SODIUM CHLORIDE 0.9 % IV SOLN
Freq: Once | INTRAVENOUS | Status: AC
  Filled 2022-03-25: qty 250

## 2022-03-25 MED ORDER — SODIUM CHLORIDE 0.9 % IV SOLN
510.0000 mg | Freq: Once | INTRAVENOUS | Status: AC
  Administered 2022-03-25: 510 mg via INTRAVENOUS
  Filled 2022-03-25: qty 510

## 2022-03-26 ENCOUNTER — Telehealth: Payer: Self-pay | Admitting: *Deleted

## 2022-03-26 NOTE — Telephone Encounter (Signed)
Call placed to patient to update on status of referral to GI. Patient can be seen in [place] [facility] clinic on 12/21, appointment details in mychart. Patient did not answer, left vm with details.

## 2022-03-28 ENCOUNTER — Ambulatory Visit
Admission: RE | Admit: 2022-03-28 | Discharge: 2022-03-28 | Disposition: A | Discharge status: Home / Self Care | Payer: Medicare Other | Source: Ambulatory Visit | Attending: Internal Medicine | Admitting: Internal Medicine

## 2022-03-28 DIAGNOSIS — R0602 Shortness of breath: Secondary | ICD-10-CM | POA: Diagnosis not present

## 2022-03-28 DIAGNOSIS — R0609 Other forms of dyspnea: Secondary | ICD-10-CM | POA: Diagnosis not present

## 2022-03-28 DIAGNOSIS — I700 Atherosclerosis of aorta: Secondary | ICD-10-CM | POA: Diagnosis not present

## 2022-04-01 ENCOUNTER — Ambulatory Visit
Admission: RE | Admit: 2022-04-01 | Discharge: 2022-04-01 | Disposition: A | Discharge status: Home / Self Care | Payer: Medicare Other | Source: Ambulatory Visit | Attending: Primary Care | Admitting: Primary Care

## 2022-04-01 DIAGNOSIS — Z1231 Encounter for screening mammogram for malignant neoplasm of breast: Secondary | ICD-10-CM

## 2022-04-03 ENCOUNTER — Other Ambulatory Visit: Payer: Self-pay | Admitting: Primary Care

## 2022-04-03 ENCOUNTER — Other Ambulatory Visit: Payer: Self-pay | Admitting: Cardiovascular Disease

## 2022-04-03 DIAGNOSIS — R928 Other abnormal and inconclusive findings on diagnostic imaging of breast: Secondary | ICD-10-CM

## 2022-04-03 NOTE — Telephone Encounter (Signed)
Please contact pt for future appointment. Pt overdue for 12 month f/u. Pt needing refills.

## 2022-04-04 ENCOUNTER — Ambulatory Visit: Payer: Medicare Other | Admitting: Gastroenterology

## 2022-04-10 ENCOUNTER — Other Ambulatory Visit: Payer: Self-pay | Admitting: Primary Care

## 2022-04-10 DIAGNOSIS — K219 Gastro-esophageal reflux disease without esophagitis: Secondary | ICD-10-CM

## 2022-04-11 ENCOUNTER — Other Ambulatory Visit: Payer: Self-pay | Admitting: Primary Care

## 2022-04-11 DIAGNOSIS — Z8614 Personal history of Methicillin resistant Staphylococcus aureus infection: Secondary | ICD-10-CM

## 2022-04-17 ENCOUNTER — Ambulatory Visit
Admission: RE | Admit: 2022-04-17 | Discharge: 2022-04-17 | Disposition: A | Discharge status: Home / Self Care | Payer: Medicare Other | Source: Ambulatory Visit | Attending: Primary Care | Admitting: Primary Care

## 2022-04-17 ENCOUNTER — Ambulatory Visit: Payer: Medicare Other

## 2022-04-17 DIAGNOSIS — R922 Inconclusive mammogram: Secondary | ICD-10-CM | POA: Diagnosis not present

## 2022-04-17 DIAGNOSIS — R928 Other abnormal and inconclusive findings on diagnostic imaging of breast: Secondary | ICD-10-CM

## 2022-04-29 ENCOUNTER — Encounter: Payer: Self-pay | Admitting: Internal Medicine

## 2022-05-06 ENCOUNTER — Encounter: Payer: Self-pay | Admitting: Orthopaedic Surgery

## 2022-05-06 ENCOUNTER — Ambulatory Visit (INDEPENDENT_AMBULATORY_CARE_PROVIDER_SITE_OTHER): Payer: Medicare Other | Admitting: Orthopaedic Surgery

## 2022-05-06 DIAGNOSIS — G8929 Other chronic pain: Secondary | ICD-10-CM

## 2022-05-06 DIAGNOSIS — M25562 Pain in left knee: Secondary | ICD-10-CM | POA: Diagnosis not present

## 2022-05-06 DIAGNOSIS — M1712 Unilateral primary osteoarthritis, left knee: Secondary | ICD-10-CM | POA: Diagnosis not present

## 2022-05-06 MED ORDER — METHYLPREDNISOLONE ACETATE 40 MG/ML IJ SUSP
40.0000 mg | INTRAMUSCULAR | Status: AC | PRN
  Administered 2022-05-06: 40 mg via INTRA_ARTICULAR

## 2022-05-06 MED ORDER — LIDOCAINE HCL 1 % IJ SOLN
3.0000 mL | INTRAMUSCULAR | Status: AC | PRN
  Administered 2022-05-06: 3 mL

## 2022-05-06 NOTE — Progress Notes (Signed)
The patient comes in today requesting steroid injection in her left knee to treat the pain from chronic pain and osteoarthritis.  She has a history of a right knee replacement that was done in [facility] about 9 months ago.  She is 87 years old.  Thus far she is not interested in any type of knee replacement on her left side.  She last had a steroid injection just over 3 months ago.  She denies any change in her medical status.  Examination of her left knee shows slight varus malalignment that is correctable.  There is patellofemoral crepitation and medial joint line tenderness.  She has good range of motion of the knee and it feels ligamentously stable.  There is no effusion.  Per her request I did place a steroid injection in her left knee without difficulty.  She understands to wait at least 3 to 4 months between injections.  All question concerns were answered and addressed.  Follow-up is as needed.      Procedure Note  Patient: [name] [name]             Date of Birth: [birth year]           MRN: [id number]             Visit Date: 05/06/2022  Procedures: Visit Diagnoses:  1. Chronic pain of left knee   2. Unilateral primary osteoarthritis, left knee     Large Joint Inj: L knee on 05/06/2022 9:19 AM Indications: diagnostic evaluation and pain Details: 22 G 1.5 in needle, superolateral approach  Arthrogram: No  Medications: 3 mL lidocaine 1 %; 40 mg methylPREDNISolone acetate 40 MG/ML Outcome: tolerated well, no immediate complications Procedure, treatment alternatives, risks and benefits explained, specific risks discussed. Consent was given by the patient. Immediately prior to procedure a time out was called to verify the correct patient, procedure, equipment, support staff and site/side marked as required. Patient was prepped and draped in the usual sterile fashion.

## 2022-05-10 ENCOUNTER — Other Ambulatory Visit: Payer: Medicare Other

## 2022-05-13 ENCOUNTER — Other Ambulatory Visit: Payer: Self-pay | Admitting: *Deleted

## 2022-05-13 ENCOUNTER — Inpatient Hospital Stay: Payer: Medicare Other | Admitting: Internal Medicine

## 2022-05-13 ENCOUNTER — Encounter: Payer: Self-pay | Admitting: *Deleted

## 2022-05-13 ENCOUNTER — Inpatient Hospital Stay: Payer: Medicare Other

## 2022-05-13 DIAGNOSIS — D509 Iron deficiency anemia, unspecified: Secondary | ICD-10-CM

## 2022-05-13 DIAGNOSIS — D559 Anemia due to enzyme disorder, unspecified: Secondary | ICD-10-CM

## 2022-05-14 ENCOUNTER — Encounter: Payer: Self-pay | Admitting: Internal Medicine

## 2022-05-19 NOTE — Progress Notes (Unsigned)
Cardiology Office Note  Date:  05/20/2022   ID:  [name] [name], DOB 1935/11/26, MRN [id number]  PCP:  [name], NP   Chief Complaint  Patient presents with   12 month follow up     Patient c/o fatigue/tiredness, shortness of breath and was recently diagnosed with anemia. Medications reviewed by the patient verbally.      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 mitral valve disease , Mild to moderate regurgitation July 2016 Chronic atypical left-sided chest pain Who presents for follow-up of her mitral valve disease, hyperlipidemia  Last seen by myself in clinic in clinic October 2021 By one of our providers March 23, 2021  Hemoglobin 7.7 in September 2023, up to 10.7 in November 2023 Has shortness of breath Iron deficient, iron down to 21 in September Iron infusion and B12  Echocardiogram ordered by primary care for shortness of breath October 2023 Ejection fraction 55 to 60%, normal RV function No significant valvular disease apart from mild to moderate MR Shortness of breath felt secondary to anemia  CT scan images pulled up and reviewed with mild calcification of aorta and coronary arteries  Statin myalgias Knee replacement on right   Labs reviewed Hemoglobin A1c 4.3 Creatinine 1.22 BUN 26, BNP 63  Followed by orthopedics for her knees Followed by rheumatology for DJD  Presents in a wheelchair No dizziness  chronic back pain She has no chest pain or SOB.No near syncope, syncope or LE edema.  Sleeps in a chair/recliner  Prior history MRSA again discussed with her, she is on suppressive antibiotics Back pain, neck pain, does not want surgery out of fear of her MRSA recurring Was told at some point she will need to have neck surgery  before symptoms get severe  EKG personally reviewed by myself on todays visit  shows Normal sinus rhythm with rate 68 bpm no significant ST or T wave changes  Other past medical history reviewed echo July 2016 with normal LV systolic function, mild to moderate mitral regurgitation.    PMH:   has a past medical history of Anxiety, Chest pain, Chronic rectal fissure, Colon polyp, Dyslipidemia, Fibromyalgia, GERD (gastroesophageal reflux disease), Hiatal hernia, Lumbar spine pain, Melanoma ([facility]), Mitral regurgitation, Osteoarthritis, Scoliosis, Sinusitis, Statin intolerance, and Thyroid nodule.  PSH:    Past Surgical History:  Procedure Laterality Date   ABDOMINAL HYSTERECTOMY     BACK SURGERY     CHOLECYSTECTOMY     FOOT SURGERY     HERNIA REPAIR     KNEE SURGERY Right 08/08/2021   total knee replacement   NASAL SINUS SURGERY      Current Outpatient Medications  Medication Sig Dispense Refill   ALPHA LIPOIC ACID PO Take 1 tablet by mouth daily. Daily     calcium carbonate (OS-CAL) 600 MG TABS Take 1,200 mg by mouth 2 (two) times daily with a meal.     cholecalciferol (VITAMIN D) 1000 UNITS tablet Take 1,000 Units by mouth daily.     clotrimazole-betamethasone (LOTRISONE) cream APPLY TOPICALLY 2 (TWO) TIMES DAILY AS NEEDED. 30 g 0   diclofenac sodium (VOLTAREN) 1 % GEL Apply 2-4 grams to affected area up to 4 times daily 4 Tube 2   famotidine (PEPCID) 20 MG tablet  Take 20 mg by mouth daily.     fenofibrate micronized (LOFIBRA) 134 MG capsule TAKE 1 CAPSULE (134 MG TOTAL) BY MOUTH AT BEDTIME. FOR CHOLESTEROL. 90 capsule 2   fish oil-omega-3 fatty acids 1000 MG capsule Take 2 g by mouth 2 (two) times daily.     gabapentin (NEURONTIN) 100 MG capsule TAKE 1 CAPSULE BY MOUTH THREE TIMES A DAY 270 capsule 0   Ginger, Zingiber officinalis, (GINGER PO) Take 1 tablet by mouth daily.     levothyroxine (SYNTHROID) 50 MCG tablet TAKE 1 TABLET BY MOUTH EVERY MORNING WITH WATER ON AN EMPTY  STOMACH. NO OTHER FOODS/MEDS FOR 30 MINS 90 tablet 2   metoprolol tartrate (LOPRESSOR) 25 MG tablet TAKE 1 TAB BY MOUTH TWICE A DAY AND EXTRA TABLET AS NEEDED FOR BREAKTHROUGH PALPITATIONS AS DIRECTED. PLEASE CONTACT OUR OFFICE TO SCHEDULE A FOLLOW UP APPOINTMENT, THANK YOU. [phone] 180 tablet 1   NON FORMULARY Take 1 capsule by mouth daily. Tumeric      Daily     sulfamethoxazole-trimethoprim (BACTRIM DS) 800-160 MG tablet TAKE 1 TABLET (160 MG OF TRIMETHOPRIM TOTAL) BY MOUTH 2 (TWO) TIMES DAILY 180 tablet 1   tretinoin (RETIN-A) 0.05 % cream Apply 1 application topically daily as needed (as directed per dermatologist).   3   omeprazole (PRILOSEC) 20 MG capsule TAKE 1 CAPSULE (20 MG TOTAL) BY MOUTH DAILY. FOR HEARTBURN. (Patient not taking: Reported on 05/20/2022) 90 capsule 2   predniSONE (DELTASONE) 20 MG tablet Take 2 tablets by mouth once daily in the morning for 5 days. (Patient not taking: Reported on 05/20/2022) 10 tablet 0   No current facility-administered medications for this visit.     Allergies:   Other, Fenofibric acid, Nsaids, Statins, Trilipix [choline fenofibrate], and Wasp venom   Social History:  The patient  reports that she has never smoked. She has never been exposed to tobacco smoke. She has never used smokeless tobacco. She reports that she does not drink alcohol and does not use drugs.   Family History:   family history includes Breast cancer in her mother; Dementia in her mother; GER disease in her mother; Heart disease in her father; Scoliosis in her daughter.    Review of Systems: Review of Systems  Constitutional: Negative.   Respiratory: Negative.    Cardiovascular: Negative.   Gastrointestinal: Negative.   Musculoskeletal:  Positive for back pain.  Neurological: Negative.   Psychiatric/Behavioral: Negative.    All other systems reviewed and are negative.   PHYSICAL EXAM: VS:  BP 120/62 (BP Location: Left Arm, Patient Position: Sitting, Cuff Size:  Normal)   Pulse 68   Ht '4\' 8"'$  (1.422 m)   Wt 156 lb (70.8 kg)   SpO2 94%   BMI 34.97 kg/m  , BMI Body mass index is 34.97 kg/m. Constitutional:  oriented to person, place, and time. No distress.  Appears pale HENT:  Head: Grossly normal Eyes:  no discharge. No scleral icterus.  Neck: No JVD, no carotid bruits  Cardiovascular: Regular rate and rhythm, no murmurs appreciated Pulmonary/Chest: Clear to auscultation bilaterally, no wheezes or rails Abdominal: Soft.  no distension.  no tenderness.  Musculoskeletal: Normal range of motion Neurological:  normal muscle tone. Coordination normal. No atrophy Skin: Skin warm and dry Psychiatric: normal affect, pleasant Recent Labs: 10/10/2021: BUN 23; Creatinine, Ser 0.97; Potassium 4.2; Sodium 140; TSH 1.92 01/08/2022: Pro B Natriuretic peptide (BNP) 95.0 03/11/2022: Hemoglobin 10.7; Platelets 208    Lipid Panel Lab Results  Component  Value Date   CHOL 157 10/10/2021   HDL 55.90 10/10/2021   LDLCALC 75 10/10/2021   TRIG 128.0 10/10/2021      Wt Readings from Last 3 Encounters:  05/20/22 156 lb (70.8 kg)  03/11/22 158 lb 6.4 oz (71.8 kg)  01/11/22 156 lb (70.8 kg)     ASSESSMENT AND PLAN:  Non-rheumatic mitral regurgitation Mitral valve regurgitation, mild to moderate Stable over the past 8 years  Palpitations - Symptoms well-controlled on metoprolol  Precordial pain Denies significant pain on today's visit, no further workup Stable  Dyslipidemia Currently not on a statin, previously tried Zetia had diarrhea Mild aortic atherosclerosis and coronary calcification, she prefers to stay on the medication she is on  Fibromyalgia mild Managed by primary care Recommend walking program as tolerated  Back pain Numerous back surgeries 1224 at [facility] complication with MRSA Significant arthralgias  Anemia Has been low and B12 and iron Has follow-up with oncology    Total encounter time more than 30 minutes  Greater than  50% was spent in counseling and coordination of care with the patient    No orders of the defined types were placed in this encounter.    Signed, Esmond Plants, M.D., Ph.D. 05/20/2022  [place], [place]

## 2022-05-20 ENCOUNTER — Encounter: Payer: Self-pay | Admitting: Cardiovascular Disease

## 2022-05-20 ENCOUNTER — Ambulatory Visit: Payer: Medicare Other | Attending: Cardiovascular Disease | Admitting: Cardiovascular Disease

## 2022-05-20 ENCOUNTER — Encounter: Payer: Self-pay | Admitting: Gastroenterology

## 2022-05-20 VITALS — BP 120/62 | HR 68 | Ht <= 58 in | Wt 156.0 lb

## 2022-05-20 DIAGNOSIS — I340 Nonrheumatic mitral (valve) insufficiency: Secondary | ICD-10-CM | POA: Diagnosis not present

## 2022-05-20 DIAGNOSIS — T466X5A Adverse effect of antihyperlipidemic and antiarteriosclerotic drugs, initial encounter: Secondary | ICD-10-CM

## 2022-05-20 DIAGNOSIS — D509 Iron deficiency anemia, unspecified: Secondary | ICD-10-CM | POA: Diagnosis not present

## 2022-05-20 DIAGNOSIS — M791 Myalgia, unspecified site: Secondary | ICD-10-CM | POA: Diagnosis not present

## 2022-05-20 MED ORDER — METOPROLOL TARTRATE 25 MG PO TABS: ORAL_TABLET | ORAL | 3 refills | Status: DC

## 2022-05-20 NOTE — Patient Instructions (Signed)
Medication Instructions:  No changes  If you need a refill on your cardiac medications before your next appointment, please call your pharmacy.   Lab work: No new labs needed  Testing/Procedures: No new testing needed  Follow-Up: At [facility], you and your health needs are our priority.  As part of our continuing mission to provide you with exceptional heart care, we have created designated Provider Care Teams.  These Care Teams include your primary Cardiologist (physician) and Advanced Practice Providers (APPs -  Physician Assistants and Nurse Practitioners) who all work together to provide you with the care you need, when you need it.  You will need a follow up appointment in 12 months  Providers on your designated Care Team:   Christopher Berge, NP Ryan Dunn, PA-C Cadence Furth, PA-C  COVID-19 Vaccine Information can be found at: [email or web address] For questions related to vaccine distribution or appointments, please email [email or web address] or call [phone].   

## 2022-05-21 ENCOUNTER — Other Ambulatory Visit: Payer: BC Managed Care – PPO

## 2022-05-27 ENCOUNTER — Other Ambulatory Visit: Payer: Self-pay | Admitting: Primary Care

## 2022-05-27 ENCOUNTER — Other Ambulatory Visit: Payer: Self-pay | Admitting: Orthopaedic Surgery

## 2022-05-27 DIAGNOSIS — I2699 Other pulmonary embolism without acute cor pulmonale: Secondary | ICD-10-CM

## 2022-05-27 DIAGNOSIS — K219 Gastro-esophageal reflux disease without esophagitis: Secondary | ICD-10-CM

## 2022-05-27 DIAGNOSIS — F411 Generalized anxiety disorder: Secondary | ICD-10-CM

## 2022-06-10 ENCOUNTER — Inpatient Hospital Stay: Payer: Medicare Other

## 2022-06-10 ENCOUNTER — Encounter: Payer: Self-pay | Admitting: Internal Medicine

## 2022-06-10 ENCOUNTER — Inpatient Hospital Stay: Payer: Medicare Other | Attending: Internal Medicine | Admitting: Internal Medicine

## 2022-06-10 VITALS — BP 130/59 | HR 76 | Temp 97.9°F | Resp 19 | Wt 159.7 lb

## 2022-06-10 DIAGNOSIS — Z96651 Presence of right artificial knee joint: Secondary | ICD-10-CM | POA: Diagnosis not present

## 2022-06-10 DIAGNOSIS — Z85820 Personal history of malignant melanoma of skin: Secondary | ICD-10-CM | POA: Insufficient documentation

## 2022-06-10 DIAGNOSIS — D509 Iron deficiency anemia, unspecified: Secondary | ICD-10-CM | POA: Insufficient documentation

## 2022-06-10 DIAGNOSIS — R0602 Shortness of breath: Secondary | ICD-10-CM | POA: Diagnosis not present

## 2022-06-10 DIAGNOSIS — D508 Other iron deficiency anemias: Secondary | ICD-10-CM | POA: Diagnosis not present

## 2022-06-10 DIAGNOSIS — Z803 Family history of malignant neoplasm of breast: Secondary | ICD-10-CM | POA: Diagnosis not present

## 2022-06-10 DIAGNOSIS — Z90710 Acquired absence of both cervix and uterus: Secondary | ICD-10-CM | POA: Insufficient documentation

## 2022-06-10 DIAGNOSIS — E538 Deficiency of other specified B group vitamins: Secondary | ICD-10-CM | POA: Diagnosis not present

## 2022-06-10 LAB — CBC WITH DIFFERENTIAL/PLATELET
Abs Immature Granulocytes: 0.01 10*3/uL (ref 0.00–0.07)
Basophils Absolute: 0 10*3/uL (ref 0.0–0.1)
Basophils Relative: 1 %
Eosinophils Absolute: 0.1 10*3/uL (ref 0.0–0.5)
Eosinophils Relative: 2 %
HCT: 35.8 % — ABNORMAL LOW (ref 36.0–46.0)
Hemoglobin: 11.4 g/dL — ABNORMAL LOW (ref 12.0–15.0)
Immature Granulocytes: 0 %
Lymphocytes Relative: 34 %
Lymphs Abs: 1.5 10*3/uL (ref 0.7–4.0)
MCH: 31.8 pg (ref 26.0–34.0)
MCHC: 31.8 g/dL (ref 30.0–36.0)
MCV: 99.7 fL (ref 80.0–100.0)
Monocytes Absolute: 0.4 10*3/uL (ref 0.1–1.0)
Monocytes Relative: 9 %
Neutro Abs: 2.4 10*3/uL (ref 1.7–7.7)
Neutrophils Relative %: 54 %
Platelets: 180 10*3/uL (ref 150–400)
RBC: 3.59 MIL/uL — ABNORMAL LOW (ref 3.87–5.11)
RDW: 12.9 % (ref 11.5–15.5)
WBC: 4.3 10*3/uL (ref 4.0–10.5)
nRBC: 0 % (ref 0.0–0.2)

## 2022-06-10 LAB — IRON AND TIBC
Iron: 87 ug/dL (ref 28–170)
Saturation Ratios: 16 % (ref 10.4–31.8)
TIBC: 536 ug/dL — ABNORMAL HIGH (ref 250–450)
UIBC: 449 ug/dL

## 2022-06-10 LAB — FERRITIN: Ferritin: 35 ng/mL (ref 11–307)

## 2022-06-10 LAB — VITAMIN B12: Vitamin B-12: 1103 pg/mL — ABNORMAL HIGH (ref 180–914)

## 2022-06-10 NOTE — Progress Notes (Signed)
[name] [name]  [phone] [phone]  ID: [name] [name] OB: 10-07-1935  MR#: [id number]  [id number]  Patient Care Team: [name], NP as PCP - General (Internal Medicine) Minna Merritts, MD as PCP - Cardiology (Cardiology)  REFERRING PROVIDER: Alma Friendly, NP  REASON FOR REFERRAL: IDA  HPI: [name] [name] is a 87 y.o. female with past medical history of hyperlipidemia, GERD, anxiety, fibromyalgia, osteoarthritis melanoma, MR was referred to hematology clinic for further management of iron deficiency anemia.  Patient reports shortness of breath on exertion for 1 month.  She is scheduled for echocardiogram.  She has occasional dizziness.  She has constant headache for 2 weeks.  She has visual disturbance with seeing wavy lines however that is chronic.  She was started on prednisone for 5 days by her primary.  Denies any nausea, vomiting, blurry vision or double vision.  Reports mild improvement with prednisone.  She denies any bleeding in stools or urine, gum or nose bleeding.  She has history of hysterectomy due to excessive bleeding in the past.  Denies any gastric bypass surgery.  She had right knee replacement in June 2023 when she required 1 unit of blood transfusion.  She was also started on oral iron which could not tolerate because of severe nausea.  She last had colonoscopy and endoscopy more than 15 years ago.  Labs reviewed. CBC from 01/08/2022 showed WBC 3.8, hemoglobin 7.7 and platelets 234.  From 10/10/2021 hemoglobin 10.7.  Iron panel showed ferritin 29, saturation 8.7%, TIBC elevated 599.  Interval history Patient was seen today accompanied by daughter to discuss labs. Patient has improvement in energy level and shortness of breath after iron infusions.  Her shortness of breath is not completely resolved but has improved.  She responded well to iron infusions.   REVIEW OF SYSTEMS:   Review of Systems  Respiratory:   Positive for shortness of breath.     As per HPI. Otherwise, a complete review of systems is negative.  PAST MEDICAL HISTORY: Past Medical History:  Diagnosis Date   Anxiety    prev on prozac   Chest pain    Felt to be noncardiac in the past   Chronic rectal fissure    Colon polyp    Dyslipidemia    Fibromyalgia    GERD (gastroesophageal reflux disease)    Barrett's esophagus   Hiatal hernia    Lumbar spine pain    Complex lumbar spine surgery at [place], 0000000, complicated, MRSA, much of the appliance is removed, patient on chronic antibiotics   Melanoma ([place])    Mitral regurgitation    a. 2007 Echo Mild MR; b. 10/2016 Echo: EF 55-60%, no rwma, GrI DD, Mild MR.   Osteoarthritis    Scoliosis    Sinusitis    Multiple sinus operations in the past   Statin intolerance    As of 2009, no further attempts to use statins   Thyroid nodule     PAST SURGICAL HISTORY: Past Surgical History:  Procedure Laterality Date   ABDOMINAL HYSTERECTOMY     BACK SURGERY     CHOLECYSTECTOMY     FOOT SURGERY     HERNIA REPAIR     KNEE SURGERY Right 08/08/2021   total knee replacement   NASAL SINUS SURGERY      FAMILY HISTORY: Family History  Problem Relation Age of Onset   GER disease Mother    Dementia Mother    Breast cancer Mother  Heart disease Father        MI at 11   Scoliosis Daughter    Colon cancer Neg Hx     HEALTH MAINTENANCE: Social History   Tobacco Use   Smoking status: Never    Passive exposure: Never   Smokeless tobacco: Never  Vaping Use   Vaping Use: Never used  Substance Use Topics   Alcohol use: No   Drug use: No     Allergies  Allergen Reactions   Other Other (See Comments)    Other Reaction: when taking for an extended period of time causes mouth ulcers and esophagus irritation Other Reaction: when taking for an extended period of time causes mouth ulcers and esophagus irritation    Fenofibric Acid Other (See Comments)    GI upset   Nsaids      REACTION: No long term use   Statins Other (See Comments)    Intolerant, myalgias Intolerant, myalgias Intolerant, myalgias Intolerant, myalgias Intolerant, myalgias Intolerant, myalgias    Trilipix [Choline Fenofibrate]     GI upset   Wasp Venom Swelling    Current Outpatient Medications  Medication Sig Dispense Refill   ALPHA LIPOIC ACID PO Take 1 tablet by mouth daily. Daily     calcium carbonate (OS-CAL) 600 MG TABS Take 1,200 mg by mouth 2 (two) times daily with a meal.     cholecalciferol (VITAMIN D) 1000 UNITS tablet Take 1,000 Units by mouth daily.     clotrimazole-betamethasone (LOTRISONE) cream APPLY TOPICALLY 2 (TWO) TIMES DAILY AS NEEDED. 30 g 0   diclofenac sodium (VOLTAREN) 1 % GEL Apply 2-4 grams to affected area up to 4 times daily 4 Tube 2   famotidine (PEPCID) 20 MG tablet TAKE 1 TABLET (20 MG TOTAL) BY MOUTH DAILY. FOR HEARTBURN. 90 tablet 1   fenofibrate micronized (LOFIBRA) 134 MG capsule TAKE 1 CAPSULE (134 MG TOTAL) BY MOUTH AT BEDTIME. FOR CHOLESTEROL. 90 capsule 2   fish oil-omega-3 fatty acids 1000 MG capsule Take 2 g by mouth 2 (two) times daily.     gabapentin (NEURONTIN) 100 MG capsule TAKE 1 CAPSULE BY MOUTH THREE TIMES A DAY 270 capsule 0   Ginger, Zingiber officinalis, (GINGER PO) Take 1 tablet by mouth daily.     levothyroxine (SYNTHROID) 50 MCG tablet TAKE 1 TABLET BY MOUTH EVERY MORNING WITH WATER ON AN EMPTY STOMACH. NO OTHER FOODS/MEDS FOR 30 MINS 90 tablet 2   metoprolol tartrate (LOPRESSOR) 25 MG tablet TAKE 1 TAB BY MOUTH TWICE A DAY AND EXTRA TABLET AS NEEDED FOR BREAKTHROUGH PALPITATIONS AS DIRECTED 180 tablet 3   Multiple Vitamins-Minerals (MULTIVITAMIN WITH MINERALS) tablet Take 1 tablet by mouth daily.     NON FORMULARY Take 1 capsule by mouth daily. Tumeric      Daily     sulfamethoxazole-trimethoprim (BACTRIM DS) 800-160 MG tablet TAKE 1 TABLET (160 MG OF TRIMETHOPRIM TOTAL) BY MOUTH 2 (TWO) TIMES DAILY 180 tablet 1   tretinoin  (RETIN-A) 0.05 % cream Apply 1 application topically daily as needed (as directed per dermatologist).   3   omeprazole (PRILOSEC) 20 MG capsule TAKE 1 CAPSULE (20 MG TOTAL) BY MOUTH DAILY. FOR HEARTBURN. (Patient not taking: Reported on 05/20/2022) 90 capsule 2   predniSONE (DELTASONE) 20 MG tablet Take 2 tablets by mouth once daily in the morning for 5 days. (Patient not taking: Reported on 05/20/2022) 10 tablet 0   No current facility-administered medications for this visit.    OBJECTIVE: Vitals:  06/10/22 1526  BP: (!) 130/59  Pulse: 76  Resp: 19  Temp: 97.9 F (36.6 C)  SpO2: 96%     Body mass index is 35.8 kg/m.      General: Well-developed, well-nourished, no acute distress. Eyes: Pink conjunctiva, anicteric sclera. HEENT: Normocephalic, moist mucous membranes, clear oropharnyx. Lungs: Clear to auscultation bilaterally. Heart: Regular rate and rhythm. No rubs, murmurs, or gallops. Abdomen: Soft, nontender, nondistended. No organomegaly noted, normoactive bowel sounds. Musculoskeletal: No edema, cyanosis, or clubbing. Neuro: Alert, answering all questions appropriately. Cranial nerves grossly intact. Skin: No rashes or petechiae noted. Psych: Normal affect. Lymphatics: No cervical, calvicular, axillary or inguinal LAD.   LAB RESULTS:  Lab Results  Component Value Date   NA 140 10/10/2021   K 4.2 10/10/2021   CL 104 10/10/2021   CO2 26 10/10/2021   GLUCOSE 101 (H) 10/10/2021   BUN 23 10/10/2021   CREATININE 0.97 10/10/2021   CALCIUM 9.1 10/10/2021   PROT 6.4 09/19/2020   ALBUMIN 4.5 09/19/2020   AST 18 09/19/2020   ALT 12 09/19/2020   ALKPHOS 50 09/19/2020   BILITOT 0.5 09/19/2020    Lab Results  Component Value Date   WBC 4.3 06/10/2022   NEUTROABS 2.4 06/10/2022   HGB 11.4 (L) 06/10/2022   HCT 35.8 (L) 06/10/2022   MCV 99.7 06/10/2022   PLT 180 06/10/2022    Lab Results  Component Value Date   TIBC 498 (H) 03/11/2022   TIBC 648 (H) 01/11/2022    TIBC 599.2 (H) 10/10/2021   FERRITIN 37 03/11/2022   FERRITIN 6 (L) 01/11/2022   FERRITIN 29.3 10/10/2021   IRONPCTSAT 13 03/11/2022   IRONPCTSAT 3 (L) 01/11/2022   IRONPCTSAT 8.7 (L) 10/10/2021     STUDIES: No results found.  ASSESSMENT AND PLAN:   [name] [name] is a 87 y.o. female with pmh of hyperlipidemia, GERD, anxiety, fibromyalgia, osteoarthritis melanoma, MR was referred to hematology clinic for further management of iron deficiency anemia.  #Iron deficiency anemia -Could not tolerate oral iron due to nausea.  Ferritin of 6.  Completed IV Feraheme x 2 on 02/10/2022.  Hemoglobin level has improved to 11.4.  Iron levels pending today.  She is due to see GI in April 2024 to evaluate for bleeding source.  #Shortness of breath -Improvement but not resolved.  Echo showed normal EF.  Chest x-ray was unremarkable.  CT chest was done which was unremarkable.  # Vitamin B12 deficiency -B12 from September 2023 165. S/p IM cyanocobalamin 1000 mcg X 4 -On B12 supplements 1000 mcg.  Check B12 level today.  Orders Placed This Encounter  Procedures   CBC with Differential/Platelet   Iron and TIBC   Ferritin   RTC in 4 months for MD visit, labs  Patient expressed understanding and was in agreement with this plan. She also understands that She can call clinic at any time with any questions, concerns, or complaints.   I spent a total of 30 minutes reviewing chart data, face-to-face evaluation with the patient, counseling and coordination of care as detailed above.  Jane Canary, MD   06/10/2022 4:21 PM

## 2022-06-12 NOTE — Progress Notes (Deleted)
Office Visit Note  Patient: [name] [name]             Date of Birth: 08-29-1935           MRN: [id number]             PCP: [name], NP Referring: [name], NP Visit Date: 06/26/2022 Occupation: @GUAROCC$ @  Subjective:  No chief complaint on file.   History of Present Illness: [name] [name] is a 87 y.o. female ***     Activities of Daily Living:  Patient reports morning stiffness for *** {minute/hour:19697}.   Patient {ACTIONS;DENIES/REPORTS:21021675::"Denies"} nocturnal pain.  Difficulty dressing/grooming: {ACTIONS;DENIES/REPORTS:21021675::"Denies"} Difficulty climbing stairs: {ACTIONS;DENIES/REPORTS:21021675::"Denies"} Difficulty getting out of chair: {ACTIONS;DENIES/REPORTS:21021675::"Denies"} Difficulty using hands for taps, buttons, cutlery, and/or writing: {ACTIONS;DENIES/REPORTS:21021675::"Denies"}  No Rheumatology ROS completed.   PMFS History:  Patient Active Problem List   Diagnosis Date Noted   IDA (iron deficiency anemia) 03/11/2022   B12 deficiency 03/11/2022   Exertional dyspnea 01/08/2022   Family history of myasthenia gravis 01/08/2022   S/P total knee arthroplasty, right 08/22/2021   GAD (generalized anxiety disorder) 08/22/2021   Bilateral pulmonary embolism ([place]) 08/22/2021   Nausea and vomiting 08/22/2021   Unilateral primary osteoarthritis, right knee 06/21/2020   Unilateral primary osteoarthritis, left knee 06/21/2020   Hand pain, right 03/01/2020   Bilateral lower extremity edema 09/27/2019   Frequent headaches 04/21/2019   Long term current use of antibiotics 03/30/2019   Infection of prosthesis ([place]) 02/10/2019   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   Chronic left shoulder pain 01/21/2018   Chronic pain of left knee 01/21/2018   Chronic pain of right knee 01/21/2018   Osteoarthritis of glenohumeral joint, left 01/21/2018   Decreased renal function 01/29/2017   Anemia 01/29/2017   Preventative health care 01/29/2017   Other  fatigue 09/03/2016   Primary insomnia 09/03/2016   Primary osteoarthritis of both knees 09/03/2016   DJD (degenerative joint disease), cervical 09/03/2016   History of MRSA infection 07/31/2016   Chronic rhinitis 07/24/2016   Sinusitis chronic, sphenoidal 07/24/2016   Osteopenia 03/04/2016   Palpitations 09/19/2014   Thyroid nodule, uninodular 09/18/2011   Hypothyroid 08/19/2011   Postmenopausal HRT (hormone replacement therapy) 08/13/2011   Endocarditis 04/24/2011   Mitral regurgitation    Lumbar spine pain    Dyslipidemia    Chest pain of uncertain etiology    Statin intolerance    Mitral valve prolapse    Ejection fraction    Fibromyalgia    ADENOMATOUS COLONIC POLYP 08/15/2007   GERD 08/15/2007   BARRETTS ESOPHAGUS 08/15/2007   Diaphragmatic hernia 08/15/2007   Osteoarthritis 08/15/2007   CATARACT EXTRACTION, HX OF 08/15/2007   Hiatal hernia 08/15/2007    Past Medical History:  Diagnosis Date   Anxiety    prev on prozac   Chest pain    Felt to be noncardiac in the past   Chronic rectal fissure    Colon polyp    Dyslipidemia    Fibromyalgia    GERD (gastroesophageal reflux disease)    Barrett's esophagus   Hiatal hernia    Lumbar spine pain    Complex lumbar spine surgery at [place], 0000000, complicated, MRSA, much of the appliance is removed, patient on chronic antibiotics   Melanoma ([place])    Mitral regurgitation    a. 2007 Echo Mild MR; b. 10/2016 Echo: EF 55-60%, no rwma, GrI DD, Mild MR.   Osteoarthritis    Scoliosis    Sinusitis    Multiple sinus  operations in the past   Statin intolerance    As of 2009, no further attempts to use statins   Thyroid nodule     Family History  Problem Relation Age of Onset   GER disease Mother    Dementia Mother    Breast cancer Mother    Heart disease Father        MI at 61   Scoliosis Daughter    Colon cancer Neg Hx    Past Surgical History:  Procedure Laterality Date   ABDOMINAL HYSTERECTOMY     BACK SURGERY      CHOLECYSTECTOMY     FOOT SURGERY     HERNIA REPAIR     KNEE SURGERY Right 08/08/2021   total knee replacement   NASAL SINUS SURGERY     Social History   Social History Narrative   From [place]   Married (second [phone]   3 kids, 2 of whom are local   Retired, Armed forces technical officer History  Administered Date(s) Administered   Fluad Quad(high Dose 65+) 03/01/2020, 01/08/2022   Influenza Split 02/03/2012   Influenza,inj,Quad PF,6+ Mos 02/20/2017, 02/03/2018, 02/10/2019   Influenza-Unspecified 02/02/2016, 01/13/2017, 02/02/2018, 01/08/2022   Moderna Covid-19 Vaccine Bivalent Booster 73yr & up 01/23/2021   Moderna Sars-Covid-2 Vaccination 04/26/2019, 05/25/2019, 02/11/2020, 08/01/2020   Pneumococcal Conjugate-13 02/10/2019   Pneumococcal Polysaccharide-23 02/03/2018   Td 02/03/2016   Tdap 10/22/2017     Objective: Vital Signs: There were no vitals taken for this visit.   Physical Exam   Musculoskeletal Exam: ***  CDAI Exam: CDAI Score: -- Patient Global: --; Provider Global: -- Swollen: --; Tender: -- Joint Exam 06/26/2022   No joint exam has been documented for this visit   There is currently no information documented on the homunculus. Go to the Rheumatology activity and complete the homunculus joint exam.  Investigation: No additional findings.  Imaging: No results found.  Recent Labs: Lab Results  Component Value Date   WBC 4.3 06/10/2022   HGB 11.4 (L) 06/10/2022   PLT 180 06/10/2022   NA 140 10/10/2021   K 4.2 10/10/2021   CL 104 10/10/2021   CO2 26 10/10/2021   GLUCOSE 101 (H) 10/10/2021   BUN 23 10/10/2021   CREATININE 0.97 10/10/2021   BILITOT 0.5 09/19/2020   ALKPHOS 50 09/19/2020   AST 18 09/19/2020   ALT 12 09/19/2020   PROT 6.4 09/19/2020   ALBUMIN 4.5 09/19/2020   CALCIUM 9.1 10/10/2021    Speciality Comments: Repeat DEXA due June 2021-ACY 11/18/2018  Procedures:  No procedures performed Allergies: Other,  Fenofibric acid, Nsaids, Statins, Trilipix [choline fenofibrate], and Wasp venom   Assessment / Plan:     Visit Diagnoses: Primary osteoarthritis of both hands  Primary osteoarthritis of left shoulder  Status post total knee replacement, right  Primary osteoarthritis of both knees  DDD (degenerative disc disease), cervical  DDD (degenerative disc disease), lumbar  Fibromyalgia  Other fatigue  Primary insomnia  Osteopenia of multiple sites  History of recent fall  History of Barrett's esophagus  History of anemia  History of gastroesophageal reflux (GERD)  History of hypothyroidism  History of mitral valve prolapse  History of hiatal hernia  History of hyperlipidemia  Orders: No orders of the defined types were placed in this encounter.  No orders of the defined types were placed in this encounter.   Face-to-face time spent with patient was *** minutes. Greater than 50% of time was spent in counseling  and coordination of care.  Follow-Up Instructions: No follow-ups on file.   Ofilia Neas, PA-C  Note - This record has been created using Dragon software.  Chart creation errors have been sought, but may not always  have been located. Such creation errors do not reflect on  the standard of medical care.

## 2022-06-18 ENCOUNTER — Ambulatory Visit: Payer: BC Managed Care – PPO | Admitting: Gastroenterology

## 2022-06-20 ENCOUNTER — Encounter: Payer: Self-pay | Admitting: Radiology

## 2022-06-26 ENCOUNTER — Ambulatory Visit: Payer: Medicare Other | Admitting: Physician Assistant

## 2022-06-26 DIAGNOSIS — M797 Fibromyalgia: Secondary | ICD-10-CM

## 2022-06-26 DIAGNOSIS — M8589 Other specified disorders of bone density and structure, multiple sites: Secondary | ICD-10-CM

## 2022-06-26 DIAGNOSIS — M19041 Primary osteoarthritis, right hand: Secondary | ICD-10-CM

## 2022-06-26 DIAGNOSIS — Z8639 Personal history of other endocrine, nutritional and metabolic disease: Secondary | ICD-10-CM

## 2022-06-26 DIAGNOSIS — Z8719 Personal history of other diseases of the digestive system: Secondary | ICD-10-CM

## 2022-06-26 DIAGNOSIS — M170 Bilateral primary osteoarthritis of knee: Secondary | ICD-10-CM

## 2022-06-26 DIAGNOSIS — R5383 Other fatigue: Secondary | ICD-10-CM

## 2022-06-26 DIAGNOSIS — Z862 Personal history of diseases of the blood and blood-forming organs and certain disorders involving the immune mechanism: Secondary | ICD-10-CM

## 2022-06-26 DIAGNOSIS — M19012 Primary osteoarthritis, left shoulder: Secondary | ICD-10-CM

## 2022-06-26 DIAGNOSIS — Z9181 History of falling: Secondary | ICD-10-CM

## 2022-06-26 DIAGNOSIS — Z8679 Personal history of other diseases of the circulatory system: Secondary | ICD-10-CM

## 2022-06-26 DIAGNOSIS — F5101 Primary insomnia: Secondary | ICD-10-CM

## 2022-06-26 DIAGNOSIS — M5030 Other cervical disc degeneration, unspecified cervical region: Secondary | ICD-10-CM

## 2022-06-26 DIAGNOSIS — M5136 Other intervertebral disc degeneration, lumbar region: Secondary | ICD-10-CM

## 2022-06-26 DIAGNOSIS — Z96651 Presence of right artificial knee joint: Secondary | ICD-10-CM

## 2022-07-15 ENCOUNTER — Other Ambulatory Visit: Payer: Self-pay

## 2022-07-15 ENCOUNTER — Ambulatory Visit (INDEPENDENT_AMBULATORY_CARE_PROVIDER_SITE_OTHER): Payer: Medicare Other | Admitting: Gastroenterology

## 2022-07-15 ENCOUNTER — Encounter: Payer: Self-pay | Admitting: Gastroenterology

## 2022-07-15 VITALS — BP 175/80 | HR 82 | Temp 98.3°F | Wt 159.0 lb

## 2022-07-15 DIAGNOSIS — D508 Other iron deficiency anemias: Secondary | ICD-10-CM

## 2022-07-15 NOTE — Progress Notes (Signed)
Jonathon Bellows MD, MRCP(U.K) [address]  [place]  [place], [place] [address]  Main: [phone]  Fax: [phone]   Gastroenterology Consultation  Referring Provider:     Jane Canary, MD Primary Care Physician:  [name], NP Primary Gastroenterologist:  Dr. Jonathon Bellows  Reason for Consultation:     Iron deficient anemia        HPI:   [name] [name] is a 87 y.o. y/o female referred for consultation & management  by Dr. Darrall Dears for iron deficiency anemia.  Seen on 06/10/2022 for shortness of breath denied any overt bleeding recently started on oral iron but could not tolerate.  Due to nausea last endoscopy and colonoscopy was 15 years back. In 01/02/2022 hemoglobin was 7.7 g had a low ferritin of 29.  06/10/2022 ferritin 35 TIBC elevated hemoglobin 11.4.  Back in 2023 B12 was also low at 165.  She denies any blood in her stool, urine, nasal bleeds.  Denies any NSAID use.   Past Medical History:  Diagnosis Date   Anxiety    prev on prozac   Chest pain    Felt to be noncardiac in the past   Chronic rectal fissure    Colon polyp    Dyslipidemia    Fibromyalgia    GERD (gastroesophageal reflux disease)    Barrett's esophagus   Hiatal hernia    Lumbar spine pain    Complex lumbar spine surgery at [place], 0000000, complicated, MRSA, much of the appliance is removed, patient on chronic antibiotics   Melanoma    Mitral regurgitation    a. 2007 Echo Mild MR; b. 10/2016 Echo: EF 55-60%, no rwma, GrI DD, Mild MR.   Osteoarthritis    Scoliosis    Sinusitis    Multiple sinus operations in the past   Statin intolerance    As of 2009, no further attempts to use statins   Thyroid nodule     Past Surgical History:  Procedure Laterality Date   ABDOMINAL HYSTERECTOMY     BACK SURGERY     CHOLECYSTECTOMY     FOOT SURGERY     HERNIA REPAIR     KNEE SURGERY Right 08/08/2021   total knee replacement   NASAL SINUS SURGERY      Prior to Admission medications    Medication Sig Start Date End Date Taking? Authorizing Provider  ALPHA LIPOIC ACID PO Take 1 tablet by mouth daily. Daily    [provider]  calcium carbonate (OS-CAL) 600 MG TABS Take 1,200 mg by mouth 2 (two) times daily with a meal.    [provider]  cholecalciferol (VITAMIN D) 1000 UNITS tablet Take 1,000 Units by mouth daily.    [provider]  ciclopirox (LOPROX) 0.77 % cream Apply 1 g/mL topically 2 (two) times daily. 05/27/22   [provider]  clotrimazole-betamethasone (LOTRISONE) cream APPLY TOPICALLY 2 (TWO) TIMES DAILY AS NEEDED. 03/08/20   [name], NP  diclofenac sodium (VOLTAREN) 1 % GEL Apply 2-4 grams to affected area up to 4 times daily 05/27/18   Bo Merino, MD  famotidine (PEPCID) 20 MG tablet TAKE 1 TABLET (20 MG TOTAL) BY MOUTH DAILY. FOR HEARTBURN. 05/27/22   [name], NP  fenofibrate micronized (LOFIBRA) 134 MG capsule TAKE 1 CAPSULE (134 MG TOTAL) BY MOUTH AT BEDTIME. FOR CHOLESTEROL. 12/09/21   [name], NP  fish oil-omega-3 fatty acids 1000 MG capsule Take 2 g by mouth 2 (two)  times daily.    [provider]  gabapentin (NEURONTIN) 100 MG capsule TAKE 1 CAPSULE BY MOUTH THREE TIMES A DAY 05/27/22   Mcarthur Rossetti, MD  Ginger, Zingiber officinalis, (GINGER PO) Take 1 tablet by mouth daily.    [provider]  levothyroxine (SYNTHROID) 50 MCG tablet TAKE 1 TABLET BY MOUTH EVERY MORNING WITH WATER ON AN EMPTY STOMACH. NO OTHER FOODS/MEDS FOR 30 MINS 12/09/21   [name], NP  metoprolol tartrate (LOPRESSOR) 25 MG tablet TAKE 1 TAB BY MOUTH TWICE A DAY AND EXTRA TABLET AS NEEDED FOR BREAKTHROUGH PALPITATIONS AS DIRECTED 05/20/22   Minna Merritts, MD  Multiple Vitamins-Minerals (MULTIVITAMIN WITH MINERALS) tablet Take 1 tablet by mouth daily.    [provider]  NON FORMULARY Take 1 capsule by mouth daily. Tumeric      Daily    [provider]   omeprazole (PRILOSEC) 20 MG capsule TAKE 1 CAPSULE (20 MG TOTAL) BY MOUTH DAILY. FOR HEARTBURN. Patient not taking: Reported on 05/20/2022 12/09/21   [name], NP  predniSONE (DELTASONE) 20 MG tablet Take 2 tablets by mouth once daily in the morning for 5 days. Patient not taking: Reported on 05/20/2022 01/08/22   [name], NP  sulfamethoxazole-trimethoprim (BACTRIM DS) 800-160 MG tablet TAKE 1 TABLET (160 MG OF TRIMETHOPRIM TOTAL) BY MOUTH 2 (TWO) TIMES DAILY 04/11/22   [name], NP  tretinoin (RETIN-A) 0.05 % cream Apply 1 application topically daily as needed (as directed per dermatologist).  12/14/13   [provider]    Family History  Problem Relation Age of Onset   GER disease Mother    Dementia Mother    Breast cancer Mother    Heart disease Father        MI at 61   Scoliosis Daughter    Colon cancer Neg Hx      Social History   Tobacco Use   Smoking status: Never    Passive exposure: Never   Smokeless tobacco: Never  Vaping Use   Vaping Use: Never used  Substance Use Topics   Alcohol use: No   Drug use: No    Allergies as of 07/15/2022 - Review Complete 07/15/2022  Allergen Reaction Noted   Other Other (See Comments) 07/08/2017   Fenofibric acid Other (See Comments) 09/19/2014   Nsaids  12/15/2009   Statins Other (See Comments) 08/13/2011   Trilipix [choline fenofibrate]  08/13/2011   Wasp venom Swelling 10/27/2017    Review of Systems:    All systems reviewed and negative except where noted in HPI.   Physical Exam:  BP (!) 175/80   Pulse 82   Temp 98.3 F (36.8 C) (Oral)   Wt 159 lb (72.1 kg)   BMI 35.65 kg/m  No LMP recorded. Patient has had a hysterectomy. Psych:  Alert and cooperative. Normal mood and affect. General:   Alert,  Well-developed, well-nourished, pleasant and cooperative in NAD Head:  Normocephalic and atraumatic. Eyes:  Sclera clear, no icterus.   Conjunctiva pink..    Neurologic:  Alert and oriented  x3;  grossly normal neurologically. Psych:  Alert and cooperative. Normal mood and affect.  Imaging Studies: No results found.  Assessment and Plan:   [name] [name] is a 87 y.o. y/o female has been referred for iron deficiency anemia.  She also has B12 deficiency no recent GI evaluation.  Completed a few doses of IV iron.  She is known to have a hiatal  hernia.  No overt blood loss  Plan 1.  H. pylori breath test, celiac serology, urine analysis EGD colonoscopy and if negative could consider capsule study of the small bowel.  She would like to go ahead with the blood tests today and urine tests, she will think about endoscopic evaluations and let me know by Monday. She has had issues with anesthesia in the past and would like to consider her options  I have discussed alternative options, risks & benefits,  which include, but are not limited to, bleeding, infection, perforation,respiratory complication & drug reaction.  The patient agrees with this plan & written consent will be obtained.    Follow up in as needed  Dr Jonathon Bellows MD,MRCP(U.K)

## 2022-07-15 NOTE — Patient Instructions (Signed)
Please let us know if you want to proceed with doing an upper and lower endoscopy.

## 2022-07-16 DIAGNOSIS — D508 Other iron deficiency anemias: Secondary | ICD-10-CM | POA: Diagnosis not present

## 2022-07-18 LAB — URINALYSIS
Bilirubin, UA: NEGATIVE
Glucose, UA: NEGATIVE
Ketones, UA: NEGATIVE
Leukocytes,UA: NEGATIVE
Nitrite, UA: NEGATIVE
Protein,UA: NEGATIVE
RBC, UA: NEGATIVE
Specific Gravity, UA: 1.026 (ref 1.005–1.030)
Urobilinogen, Ur: 0.2 mg/dL (ref 0.2–1.0)
pH, UA: 5.5 (ref 5.0–7.5)

## 2022-07-18 LAB — CELIAC DISEASE AB SCREEN W/RFX
Antigliadin Abs, IgA: 2 units (ref 0–19)
IgA/Immunoglobulin A, Serum: 111 mg/dL (ref 64–422)
Transglutaminase IgA: <2 U/mL (ref 0–3)

## 2022-07-18 LAB — TSH: TSH: 2.47 u[IU]/mL (ref 0.450–4.500)

## 2022-07-18 LAB — H. PYLORI BREATH TEST: H pylori Breath Test: NEGATIVE

## 2022-07-19 ENCOUNTER — Encounter: Payer: Self-pay | Admitting: Gastroenterology

## 2022-07-22 NOTE — Telephone Encounter (Signed)
EGD+colonoscopy please

## 2022-07-24 ENCOUNTER — Encounter: Payer: Self-pay | Admitting: Internal Medicine

## 2022-07-24 ENCOUNTER — Other Ambulatory Visit: Payer: Self-pay

## 2022-07-24 DIAGNOSIS — D508 Other iron deficiency anemias: Secondary | ICD-10-CM

## 2022-07-24 MED ORDER — NA SULFATE-K SULFATE-MG SULF 17.5-3.13-1.6 GM/177ML PO SOLN: 354.0000 mL | Freq: Once | ORAL | 0 refills | Status: DC

## 2022-08-01 ENCOUNTER — Other Ambulatory Visit: Payer: Self-pay | Admitting: Gastroenterology

## 2022-08-01 MED ORDER — SUTAB 1479-225-188 MG PO TABS: ORAL_TABLET | ORAL | 0 refills | Status: DC

## 2022-08-01 NOTE — Addendum Note (Signed)
Addended by: Adela Ports on: 08/01/2022 08:41 AM   Modules accepted: Orders

## 2022-08-05 ENCOUNTER — Telehealth: Payer: Self-pay | Admitting: Gastroenterology

## 2022-08-05 ENCOUNTER — Ambulatory Visit (INDEPENDENT_AMBULATORY_CARE_PROVIDER_SITE_OTHER): Payer: Medicare Other | Admitting: Orthopaedic Surgery

## 2022-08-05 ENCOUNTER — Encounter: Payer: Self-pay | Admitting: Orthopaedic Surgery

## 2022-08-05 DIAGNOSIS — M25562 Pain in left knee: Secondary | ICD-10-CM

## 2022-08-05 DIAGNOSIS — M1712 Unilateral primary osteoarthritis, left knee: Secondary | ICD-10-CM | POA: Diagnosis not present

## 2022-08-05 DIAGNOSIS — G8929 Other chronic pain: Secondary | ICD-10-CM | POA: Diagnosis not present

## 2022-08-05 MED ORDER — METHYLPREDNISOLONE ACETATE 40 MG/ML IJ SUSP
40.0000 mg | INTRAMUSCULAR | Status: AC | PRN
Start: 2022-08-05 — End: 2022-08-05
  Administered 2022-08-05: 40 mg via INTRA_ARTICULAR

## 2022-08-05 MED ORDER — LIDOCAINE HCL 1 % IJ SOLN
3.0000 mL | INTRAMUSCULAR | Status: AC | PRN
Start: 2022-08-05 — End: 2022-08-05
  Administered 2022-08-05: 3 mL

## 2022-08-05 NOTE — Telephone Encounter (Signed)
Called patient back and answered all of her questions about how to prep for tomorrow. Patient then had no additional questions.

## 2022-08-05 NOTE — Progress Notes (Signed)
  The patient is well-known to me.  She comes in today requesting a steroid injection in her left knee to treat the pain from osteoarthritis.  She has these about every 3 months.  She says that they do last for a few months.  She is 87 ye1ars old and tries to be active.  She is still dealing with anemia and says she does have an upper endoscopy and colonoscopy coming up.  On exam her left knee shows some varus malalignment with no effusion.  There is medial and lateral joint line tenderness and patellofemoral tenderness.  The range of motion is good and the knee is stable ligamentously.  Per her request I did place a steroid injection in her left knee which she tolerated well.  We can do this again in 3 months.      Procedure Note  Patient: [name] [name]             Date of Birth: 01-Apr-1936           MRN: [id number]             Visit Date: 08/05/2022  Procedures: Visit Diagnoses:  1. Chronic pain of left knee   2. Unilateral primary osteoarthritis, left knee     Large Joint Inj: L knee on 08/05/2022 10:12 AM Indications: diagnostic evaluation and pain Details: 22 G 1.5 in needle, superolateral approach  Arthrogram: No  Medications: 3 mL lidocaine 1 %; 40 mg methylPREDNISolone acetate 40 MG/ML Outcome: tolerated well, no immediate complications Procedure, treatment alternatives, risks and benefits explained, specific risks discussed. Consent was given by the patient. Immediately prior to procedure a time out was called to verify the correct patient, procedure, equipment, support staff and site/side marked as required. Patient was prepped and draped in the usual sterile fashion.

## 2022-08-05 NOTE — Telephone Encounter (Signed)
Pt needs call back to discuss upcoming procedure instructions  please return cal

## 2022-08-06 ENCOUNTER — Encounter: Payer: Self-pay | Admitting: Gastroenterology

## 2022-08-07 ENCOUNTER — Ambulatory Visit: Payer: Medicare Other | Admitting: Certified Registered"

## 2022-08-07 ENCOUNTER — Encounter
Admission: RE | Disposition: A | Discharge status: Home / Self Care | Payer: Self-pay | Source: Home / Self Care | Attending: Gastroenterology

## 2022-08-07 ENCOUNTER — Ambulatory Visit
Admission: RE | Admit: 2022-08-07 | Discharge: 2022-08-07 | Disposition: A | Discharge status: Home / Self Care | Payer: Medicare Other | Attending: Gastroenterology | Admitting: Gastroenterology

## 2022-08-07 ENCOUNTER — Encounter: Payer: Self-pay | Admitting: Gastroenterology

## 2022-08-07 DIAGNOSIS — K259 Gastric ulcer, unspecified as acute or chronic, without hemorrhage or perforation: Secondary | ICD-10-CM | POA: Diagnosis not present

## 2022-08-07 DIAGNOSIS — E785 Hyperlipidemia, unspecified: Secondary | ICD-10-CM | POA: Diagnosis not present

## 2022-08-07 DIAGNOSIS — Z08 Encounter for follow-up examination after completed treatment for malignant neoplasm: Secondary | ICD-10-CM | POA: Diagnosis not present

## 2022-08-07 DIAGNOSIS — K2970 Gastritis, unspecified, without bleeding: Secondary | ICD-10-CM | POA: Diagnosis not present

## 2022-08-07 DIAGNOSIS — E039 Hypothyroidism, unspecified: Secondary | ICD-10-CM | POA: Insufficient documentation

## 2022-08-07 DIAGNOSIS — Z09 Encounter for follow-up examination after completed treatment for conditions other than malignant neoplasm: Secondary | ICD-10-CM | POA: Insufficient documentation

## 2022-08-07 DIAGNOSIS — K219 Gastro-esophageal reflux disease without esophagitis: Secondary | ICD-10-CM | POA: Insufficient documentation

## 2022-08-07 DIAGNOSIS — Z792 Long term (current) use of antibiotics: Secondary | ICD-10-CM | POA: Insufficient documentation

## 2022-08-07 DIAGNOSIS — M1990 Unspecified osteoarthritis, unspecified site: Secondary | ICD-10-CM | POA: Diagnosis not present

## 2022-08-07 DIAGNOSIS — M797 Fibromyalgia: Secondary | ICD-10-CM | POA: Diagnosis not present

## 2022-08-07 DIAGNOSIS — K319 Disease of stomach and duodenum, unspecified: Secondary | ICD-10-CM | POA: Diagnosis not present

## 2022-08-07 DIAGNOSIS — D126 Benign neoplasm of colon, unspecified: Secondary | ICD-10-CM

## 2022-08-07 DIAGNOSIS — K449 Diaphragmatic hernia without obstruction or gangrene: Secondary | ICD-10-CM | POA: Diagnosis not present

## 2022-08-07 DIAGNOSIS — K635 Polyp of colon: Secondary | ICD-10-CM | POA: Diagnosis not present

## 2022-08-07 DIAGNOSIS — D508 Other iron deficiency anemias: Secondary | ICD-10-CM

## 2022-08-07 DIAGNOSIS — D122 Benign neoplasm of ascending colon: Secondary | ICD-10-CM | POA: Diagnosis not present

## 2022-08-07 DIAGNOSIS — F419 Anxiety disorder, unspecified: Secondary | ICD-10-CM | POA: Insufficient documentation

## 2022-08-07 DIAGNOSIS — M419 Scoliosis, unspecified: Secondary | ICD-10-CM | POA: Insufficient documentation

## 2022-08-07 DIAGNOSIS — Z8614 Personal history of Methicillin resistant Staphylococcus aureus infection: Secondary | ICD-10-CM | POA: Insufficient documentation

## 2022-08-07 DIAGNOSIS — D125 Benign neoplasm of sigmoid colon: Secondary | ICD-10-CM | POA: Insufficient documentation

## 2022-08-07 DIAGNOSIS — K254 Chronic or unspecified gastric ulcer with hemorrhage: Secondary | ICD-10-CM | POA: Insufficient documentation

## 2022-08-07 DIAGNOSIS — D509 Iron deficiency anemia, unspecified: Secondary | ICD-10-CM | POA: Diagnosis not present

## 2022-08-07 DIAGNOSIS — Z85820 Personal history of malignant melanoma of skin: Secondary | ICD-10-CM | POA: Insufficient documentation

## 2022-08-07 HISTORY — PX: COLONOSCOPY WITH PROPOFOL: SHX5780

## 2022-08-07 HISTORY — PX: ESOPHAGOGASTRODUODENOSCOPY (EGD) WITH PROPOFOL: SHX5813

## 2022-08-07 SURGERY — COLONOSCOPY WITH PROPOFOL
Anesthesia: General

## 2022-08-07 MED ORDER — PROPOFOL 500 MG/50ML IV EMUL
INTRAVENOUS | Status: DC | PRN
  Administered 2022-08-07: 145 ug/kg/min via INTRAVENOUS

## 2022-08-07 MED ORDER — SODIUM CHLORIDE 0.9 % IV SOLN: INTRAVENOUS | Status: DC

## 2022-08-07 MED ORDER — GLYCOPYRROLATE 0.2 MG/ML IJ SOLN
INTRAMUSCULAR | Status: DC | PRN
  Administered 2022-08-07: .2 mg via INTRAVENOUS

## 2022-08-07 MED ORDER — PHENYLEPHRINE 80 MCG/ML (10ML) SYRINGE FOR IV PUSH (FOR BLOOD PRESSURE SUPPORT)
PREFILLED_SYRINGE | INTRAVENOUS | Status: DC | PRN
  Administered 2022-08-07 (×3): 160 ug via INTRAVENOUS

## 2022-08-07 MED ORDER — PROPOFOL 10 MG/ML IV BOLUS
INTRAVENOUS | Status: DC | PRN
  Administered 2022-08-07: 50 mg via INTRAVENOUS

## 2022-08-07 MED ORDER — ONDANSETRON HCL 4 MG/2ML IJ SOLN
INTRAMUSCULAR | Status: DC | PRN
  Administered 2022-08-07: 4 mg via INTRAVENOUS

## 2022-08-07 MED ORDER — EPHEDRINE SULFATE (PRESSORS) 50 MG/ML IJ SOLN
INTRAMUSCULAR | Status: DC | PRN
  Administered 2022-08-07: 5 mg via INTRAVENOUS

## 2022-08-07 MED ORDER — LIDOCAINE HCL (CARDIAC) PF 100 MG/5ML IV SOSY
PREFILLED_SYRINGE | INTRAVENOUS | Status: DC | PRN
  Administered 2022-08-07: 100 mg via INTRAVENOUS

## 2022-08-07 MED ORDER — OMEPRAZOLE MAGNESIUM 20 MG PO TBEC: 40.0000 mg | DELAYED_RELEASE_TABLET | Freq: Two times a day (BID) | ORAL | 0 refills | Status: DC

## 2022-08-07 NOTE — Anesthesia Procedure Notes (Addendum)
Procedure Name: General with mask airway Date/Time: 08/07/2022 10:22 AM  Performed by: Mohammed Kindle, CRNAPre-anesthesia Checklist: Patient identified, Emergency Drugs available, Suction available and Patient being monitored Patient Re-evaluated:Patient Re-evaluated prior to induction Oxygen Delivery Method: Simple face mask Induction Type: IV induction Placement Confirmation: positive ETCO2, CO2 detector and breath sounds checked- equal and bilateral Dental Injury: Teeth and Oropharynx as per pre-operative assessment

## 2022-08-07 NOTE — Anesthesia Postprocedure Evaluation (Signed)
Anesthesia Post Note  Patient: [name] [name]  Procedure(s) Performed: COLONOSCOPY WITH PROPOFOL ESOPHAGOGASTRODUODENOSCOPY (EGD) WITH PROPOFOL  Patient location during evaluation: Endoscopy Anesthesia Type: General Level of consciousness: awake and alert Pain management: pain level controlled Vital Signs Assessment: post-procedure vital signs reviewed and stable Respiratory status: spontaneous breathing, nonlabored ventilation, respiratory function stable and patient connected to nasal cannula oxygen Cardiovascular status: blood pressure returned to baseline and stable Postop Assessment: no apparent nausea or vomiting Anesthetic complications: no   No notable events documented.   Last Vitals:  Vitals:   08/07/22 1054 08/07/22 1105  BP:  121/62  Pulse: 71 87  Resp: 12 20  Temp: (!) 36 C   SpO2: 100% 98%    Last Pain:  Vitals:   08/07/22 1105  TempSrc:   PainSc: 0-No pain                 Corinda Gubler

## 2022-08-07 NOTE — Transfer of Care (Signed)
Immediate Anesthesia Transfer of Care Note  Patient: [name] [name]  Procedure(s) Performed: COLONOSCOPY WITH PROPOFOL ESOPHAGOGASTRODUODENOSCOPY (EGD) WITH PROPOFOL  Patient Location: Endoscopy Unit  Anesthesia Type:General  Level of Consciousness: awake, drowsy, and patient cooperative  Airway & Oxygen Therapy: Patient Spontanous Breathing and Patient connected to face mask oxygen  Post-op Assessment: Report given to RN and Post -op Vital signs reviewed and stable  Post vital signs: Reviewed and stable  Last Vitals:  Vitals Value Taken Time  BP    Temp 36 C 08/07/22 1054  Pulse 71 08/07/22 1054  Resp 12 08/07/22 1054  SpO2 100 % 08/07/22 1054    Last Pain:  Vitals:   08/07/22 1054  TempSrc: Temporal  PainSc: Asleep         Complications: No notable events documented.

## 2022-08-07 NOTE — H&P (Signed)
[name], MD [address], [place], [place], [address] [address], [place], [place], [address] Phone: [phone]  Fax: [phone]  Primary Care Physician:  Doreene Nest, NP   Pre-Procedure History & Physical: HPI:  [name] [name] is a 87 y.o. female is here for an endoscopy and colonoscopy    Past Medical History:  Diagnosis Date   Anxiety    prev on prozac   Chest pain    Felt to be noncardiac in the past   Chronic rectal fissure    Colon polyp    Dyslipidemia    Fibromyalgia    GERD (gastroesophageal reflux disease)    Barrett's esophagus   Hiatal hernia    Lumbar spine pain    Complex lumbar spine surgery at [facility], 2012, complicated, MRSA, much of the appliance is removed, patient on chronic antibiotics   Melanoma    Mitral regurgitation    a. 2007 Echo Mild MR; b. 10/2016 Echo: EF 55-60%, no rwma, GrI DD, Mild MR.   Osteoarthritis    Scoliosis    Sinusitis    Multiple sinus operations in the past   Statin intolerance    As of 2009, no further attempts to use statins   Thyroid nodule     Past Surgical History:  Procedure Laterality Date   ABDOMINAL HYSTERECTOMY     BACK SURGERY     CHOLECYSTECTOMY     FOOT SURGERY     HERNIA REPAIR     KNEE SURGERY Right 08/08/2021   total knee replacement   NASAL SINUS SURGERY      Prior to Admission medications   Medication Sig Start Date End Date Taking? Authorizing Provider  levothyroxine (SYNTHROID) 50 MCG tablet TAKE 1 TABLET BY MOUTH EVERY MORNING WITH WATER ON AN EMPTY STOMACH. NO OTHER FOODS/MEDS FOR 30 MINS 12/09/21  Yes Doreene Nest, NP  metoprolol tartrate (LOPRESSOR) 25 MG tablet TAKE 1 TAB BY MOUTH TWICE A DAY AND EXTRA TABLET AS NEEDED FOR BREAKTHROUGH PALPITATIONS AS DIRECTED 05/20/22  Yes Gollan, Tollie Pizza, MD  ALPHA LIPOIC ACID PO Take 1 tablet by mouth daily. Daily    [provider]  calcium carbonate (OS-CAL) 600 MG TABS Take 1,200 mg by mouth 2 (two)  times daily with a meal.    [provider]  cholecalciferol (VITAMIN D) 1000 UNITS tablet Take 1,000 Units by mouth daily.    [provider]  ciclopirox (LOPROX) 0.77 % cream Apply 1 g/mL topically 2 (two) times daily. 05/27/22   [provider]  clotrimazole-betamethasone (LOTRISONE) cream APPLY TOPICALLY 2 (TWO) TIMES DAILY AS NEEDED. 03/08/20   Doreene Nest, NP  diclofenac sodium (VOLTAREN) 1 % GEL Apply 2-4 grams to affected area up to 4 times daily 05/27/18   Pollyann Savoy, MD  famotidine (PEPCID) 20 MG tablet TAKE 1 TABLET (20 MG TOTAL) BY MOUTH DAILY. FOR HEARTBURN. 05/27/22   Doreene Nest, NP  fenofibrate micronized (LOFIBRA) 134 MG capsule TAKE 1 CAPSULE (134 MG TOTAL) BY MOUTH AT BEDTIME. FOR CHOLESTEROL. 12/09/21   Doreene Nest, NP  fish oil-omega-3 fatty acids 1000 MG capsule Take 2 g by mouth 2 (two) times daily.    [provider]  gabapentin (NEURONTIN) 100 MG capsule TAKE 1 CAPSULE BY MOUTH THREE TIMES A DAY 05/27/22   Kathryne Hitch, MD  Ginger, Zingiber officinalis, (GINGER PO) Take 1 tablet by mouth daily.    [provider]  Multiple Vitamins-Minerals (MULTIVITAMIN WITH MINERALS) tablet Take 1 tablet by mouth daily.    [provider]  NON FORMULARY Take 1 capsule by mouth daily. Tumeric      Daily    [provider]  omeprazole (PRILOSEC) 20 MG capsule TAKE 1 CAPSULE (20 MG TOTAL) BY MOUTH DAILY. FOR HEARTBURN. 12/09/21   Doreene Nest, NP  predniSONE (DELTASONE) 20 MG tablet Take 2 tablets by mouth once daily in the morning for 5 days. 01/08/22   Doreene Nest, NP  Sodium Sulfate-Mag Sulfate-KCl (SUTAB) [phone] MG TABS At 5 PM take 12 tablets using the 8 oz cup provided in the kit drinking 5 cups of water and 5 hours before your procedure repeat the same process. 08/01/22   [name], MD  sulfamethoxazole-trimethoprim (BACTRIM DS) 800-160 MG tablet TAKE 1 TABLET (160 MG OF  TRIMETHOPRIM TOTAL) BY MOUTH 2 (TWO) TIMES DAILY 04/11/22   Doreene Nest, NP  tretinoin (RETIN-A) 0.05 % cream Apply 1 application topically daily as needed (as directed per dermatologist).  12/14/13   [provider]    Allergies as of 07/24/2022 - Review Complete 07/15/2022  Allergen Reaction Noted   Other Other (See Comments) 07/08/2017   Fenofibric acid Other (See Comments) 09/19/2014   Nsaids  12/15/2009   Statins Other (See Comments) 08/13/2011   Trilipix [choline fenofibrate]  08/13/2011   Wasp venom Swelling 10/27/2017    Family History  Problem Relation Age of Onset   GER disease Mother    Dementia Mother    Breast cancer Mother    Heart disease Father        MI at 11   Scoliosis Daughter    Colon cancer Neg Hx     Social History   Socioeconomic History   Marital status: Married    Spouse name: Not on file   Number of children: Not on file   Years of education: Not on file   Highest education level: Not on file  Occupational History   Not on file  Tobacco Use   Smoking status: Never    Passive exposure: Never   Smokeless tobacco: Never  Vaping Use   Vaping Use: Never used  Substance and Sexual Activity   Alcohol use: No   Drug use: No   Sexual activity: Not on file  Other Topics Concern   Not on file  Social History Narrative   From [place]   Married (second [phone]   3 kids, 2 of whom are local   Retired, Tree surgeon   Social Determinants of Health   Financial Resource Strain: Low Risk  (10/02/2021)   Overall Financial Resource Strain (CARDIA)    Difficulty of Paying Living Expenses: Not hard at all  Food Insecurity: No Food Insecurity (10/02/2021)   Hunger Vital Sign    Worried About Running Out of Food in the Last Year: Never true    Ran Out of Food in the Last Year: Never true  Transportation Needs: No Transportation Needs (10/02/2021)   PRAPARE - Administrator, Civil Service (Medical): No    Lack of  Transportation (Non-Medical): No  Physical Activity: Insufficiently Active (10/02/2021)   Exercise Vital Sign    Days of Exercise per Week: 2 days    Minutes of Exercise per Session: 60 min  Stress: No Stress Concern Present (10/02/2021)   [employer] of Occupational Health - Occupational Stress Questionnaire    Feeling of Stress : Not at  all  Social Connections: Socially Integrated (10/02/2021)   Social Connection and Isolation Panel [NHANES]    Frequency of Communication with Friends and Family: More than three times a week    Frequency of Social Gatherings with Friends and Family: More than three times a week    Attends Religious Services: More than 4 times per year    Active Member of [employer] or Organizations: Yes    Attends Engineer, structural: More than 4 times per year    Marital Status: Married  Catering manager Violence: Not At Risk (10/02/2021)   Humiliation, Afraid, Rape, and Kick questionnaire    Fear of Current or Ex-Partner: No    Emotionally Abused: No    Physically Abused: No    Sexually Abused: No    Review of Systems: See HPI, otherwise negative ROS  Physical Exam: There were no vitals taken for this visit. General:   Alert,  pleasant and cooperative in NAD Head:  Normocephalic and atraumatic. Neck:  Supple; no masses or thyromegaly. Lungs:  Clear throughout to auscultation, normal respiratory effort.    Heart:  +S1, +S2, Regular rate and rhythm, No edema. Abdomen:  Soft, nontender and nondistended. Normal bowel sounds, without guarding, and without rebound.   Neurologic:  Alert and  oriented x4;  grossly normal neurologically.  Impression/Plan: [name] [name] is here for an endoscopy and colonoscopy  to be performed for  evaluation of iron deficiency anemia    Risks, benefits, limitations, and alternatives regarding endoscopy have been reviewed with the patient.  Questions have been answered.  All parties agreeable.   [name], MD  08/07/2022,  9:31 AM

## 2022-08-07 NOTE — Op Note (Addendum)
[facility] Gastroenterology Patient Name: [name] [name] Procedure Date: 08/07/2022 9:55 AM MRN: [id number] Account #: [id number] Date of Birth: May 24, 1935 Admit Type: Outpatient Age: 87 Room: 3 Gender: Female Note Status: Finalized Instrument Name: Upper Endoscope [id number] Procedure:             Upper GI endoscopy Indications:           Iron deficiency anemia Providers:             [name] MD, MD Referring MD:          [name] MD, MD (Referring MD), Doreene Nest                         (Referring MD) Medicines:             Monitored Anesthesia Care Complications:         No immediate complications. Procedure:             Pre-Anesthesia Assessment:                        - Prior to the procedure, a History and Physical was                         performed, and patient medications, allergies and                         sensitivities were reviewed. The patient's tolerance                         of previous anesthesia was reviewed.                        - The risks and benefits of the procedure and the                         sedation options and risks were discussed with the                         patient. All questions were answered and informed                         consent was obtained.                        - ASA Grade Assessment: II - A patient with mild                         systemic disease.                        After obtaining informed consent, the endoscope was                         passed under direct vision. Throughout the procedure,                         the patient's blood pressure, pulse, and oxygen                         saturations were monitored continuously.  The Endoscope                         was introduced through the mouth, and advanced to the                         third part of duodenum. The upper GI endoscopy was                         accomplished with ease. The patient tolerated the                          procedure well. Findings:      The esophagus was normal.      The examined duodenum was normal.      Hematin (altered blood/coffee-ground-like material) was found on the       greater curvature of the stomach.      Two non-bleeding superficial gastric ulcers with a flat pigmented spot       (Forrest Class IIc) were found on the greater curvature of the stomach.       The largest lesion was 10 mm in largest dimension. Biopsies were taken       with a cold forceps for histology.      The cardia and gastric fundus were normal on retroflexion. Impression:            - Normal esophagus.                        - Normal examined duodenum.                        - Hematin (altered blood/coffee-ground-like material)                         in the greater curvature of the stomach.                        - Non-bleeding gastric ulcers with a flat pigmented                         spot (Forrest Class IIc). Biopsied. Recommendation:        - Await pathology results.                        - Use Prilosec (omeprazole) 40 mg PO BID for 3 months.                        - Perform a colonoscopy today. Procedure Code(s):     --- Professional ---                        [phone], Esophagogastroduodenoscopy, flexible,                         transoral; with biopsy, single or multiple Diagnosis Code(s):     --- Professional ---                        K92.2, Gastrointestinal hemorrhage, unspecified  K25.9, Gastric ulcer, unspecified as acute or chronic,                         without hemorrhage or perforation                        D50.9, Iron deficiency anemia, unspecified CPT copyright 2022 American Medical Association. All rights reserved. The codes documented in this report are preliminary and upon coder review may  be revised to meet current compliance requirements. [name], MD [name] MD, MD 08/07/2022 10:29:44 AM This report has been signed electronically. Number of Addenda:  0 Note Initiated On: 08/07/2022 9:55 AM Estimated Blood Loss:  Estimated blood loss: none.      [facility]

## 2022-08-07 NOTE — Op Note (Signed)
[facility] Gastroenterology Patient Name: [name] [name] Procedure Date: 08/07/2022 9:54 AM MRN: [id number] Account #: [id number] Date of Birth: 27-Sep-1935 Admit Type: Outpatient Age: 87 Room: [facility] ENDO ROOM 3 Gender: Female Note Status: Finalized Instrument Name: Peds Colonoscope [id number] Procedure:             Colonoscopy Indications:           Iron deficiency anemia Providers:             [name] MD, MD Referring MD:          [name] MD, MD (Referring MD), Doreene Nest                         (Referring MD) Medicines:             Monitored Anesthesia Care Complications:         No immediate complications. Procedure:             Pre-Anesthesia Assessment:                        - Prior to the procedure, a History and Physical was                         performed, and patient medications, allergies and                         sensitivities were reviewed. The patient's tolerance                         of previous anesthesia was reviewed.                        - The risks and benefits of the procedure and the                         sedation options and risks were discussed with the                         patient. All questions were answered and informed                         consent was obtained.                        - ASA Grade Assessment: II - A patient with mild                         systemic disease.                        After obtaining informed consent, the colonoscope was                         passed under direct vision. Throughout the procedure,                         the patient's blood pressure, pulse, and oxygen                         saturations were monitored  continuously. The                         Colonoscope was introduced through the anus and                         advanced to the the cecum, identified by the                         appendiceal orifice. The colonoscopy was performed                         with ease. The  patient tolerated the procedure well.                         The quality of the bowel preparation was fair. Findings:      The perianal and digital rectal examinations were normal.      Five sessile polyps were found in the ascending colon. The polyps were 5       to 6 mm in size. These polyps were removed with a cold snare. Resection       and retrieval were complete.      Two sessile polyps were found in the sigmoid colon. The polyps were 5 to       7 mm in size. These polyps were removed with a cold snare. Resection and       retrieval were complete. To prevent bleeding after the polypectomy, one       hemostatic clip was successfully placed. There was no bleeding at the       end of the procedure.      A moderate amount of semi-solid stool was found in the ascending colon       and in the cecum, making visualization difficult. Impression:            - Preparation of the colon was fair.                        - Five 5 to 6 mm polyps in the ascending colon,                         removed with a cold snare. Resected and retrieved.                        - Two 5 to 7 mm polyps in the sigmoid colon, removed                         with a cold snare. Resected and retrieved. Clip was                         placed.                        - Stool in the ascending colon and in the cecum. Recommendation:        - Discharge patient to home (with escort).                        - Resume previous diet.                        -  Continue present medications.                        - Await pathology results.                        - Repeat colonoscopy is not recommended due to current                         age (93 years or older) for surveillance.                        - Return to GI office as previously scheduled. Procedure Code(s):     --- Professional ---                        [phone], Colonoscopy, flexible; with removal of                         tumor(s), polyp(s), or other lesion(s) by  snare                         technique Diagnosis Code(s):     --- Professional ---                        D12.2, Benign neoplasm of ascending colon                        D12.5, Benign neoplasm of sigmoid colon                        D50.9, Iron deficiency anemia, unspecified CPT copyright 2022 American Medical Association. All rights reserved. The codes documented in this report are preliminary and upon coder review may  be revised to meet current compliance requirements. [name], MD [name] MD, MD 08/07/2022 10:53:11 AM This report has been signed electronically. Number of Addenda: 0 Note Initiated On: 08/07/2022 9:54 AM Scope Withdrawal Time: 0 hours 12 minutes 20 seconds  Total Procedure Duration: 0 hours 19 minutes 53 seconds  Estimated Blood Loss:  Estimated blood loss: none.      [facility]

## 2022-08-07 NOTE — Anesthesia Preprocedure Evaluation (Signed)
Anesthesia Evaluation  Patient identified by MRN, date of birth, ID band Patient awake    Reviewed: Allergy & Precautions, NPO status , Patient's Chart, lab work & pertinent test results  History of Anesthesia Complications (+) PONV and history of anesthetic complications  Airway Mallampati: II  TM Distance: >3 FB Neck ROM: Full    Dental no notable dental hx. (+) Teeth Intact   Pulmonary neg pulmonary ROS, neg sleep apnea, neg COPD, Patient abstained from smoking.Not current smoker   Pulmonary exam normal breath sounds clear to auscultation       Cardiovascular Exercise Tolerance: Good METS(-) hypertension(-) CAD and (-) Past MI negative cardio ROS (-) dysrhythmias  Rhythm:Regular Rate:Normal - Systolic murmurs    Neuro/Psych  Headaches PSYCHIATRIC DISORDERS Anxiety        GI/Hepatic hiatal hernia,GERD  Medicated,,(+)     (-) substance abuse    Endo/Other  neg diabetesHypothyroidism    Renal/GU negative Renal ROS     Musculoskeletal  (+) Arthritis ,  Fibromyalgia -  Abdominal   Peds  Hematology   Anesthesia Other Findings Past Medical History: No date: Anxiety     Comment:  prev on prozac No date: Chest pain     Comment:  Felt to be noncardiac in the past No date: Chronic rectal fissure No date: Colon polyp No date: Dyslipidemia No date: Fibromyalgia No date: GERD (gastroesophageal reflux disease)     Comment:  Barrett's esophagus No date: Hiatal hernia No date: Lumbar spine pain     Comment:  Complex lumbar spine surgery at [facility], 2012, complicated,              MRSA, much of the appliance is removed, patient on               chronic antibiotics No date: Melanoma No date: Mitral regurgitation     Comment:  a. 2007 Echo Mild MR; b. 10/2016 Echo: EF 55-60%, no               rwma, GrI DD, Mild MR. No date: Osteoarthritis No date: Scoliosis No date: Sinusitis     Comment:  Multiple sinus operations in  the past No date: Statin intolerance     Comment:  As of 2009, no further attempts to use statins No date: Thyroid nodule  Reproductive/Obstetrics                              Anesthesia Physical Anesthesia Plan  ASA: 2  Anesthesia Plan: General   Post-op Pain Management: Minimal or no pain anticipated   Induction: Intravenous  PONV Risk Score and Plan: 4 or greater and Propofol infusion, TIVA and Ondansetron  Airway Management Planned: Nasal Cannula  Additional Equipment: None  Intra-op Plan:   Post-operative Plan:   Informed Consent: I have reviewed the patients History and Physical, chart, labs and discussed the procedure including the risks, benefits and alternatives for the proposed anesthesia with the patient or authorized representative who has indicated his/her understanding and acceptance.     Dental advisory given  Plan Discussed with: CRNA and Surgeon  Anesthesia Plan Comments: (Discussed risks of anesthesia with patient, including possibility of difficulty with spontaneous ventilation under anesthesia necessitating airway intervention, PONV, and rare risks such as cardiac or respiratory or neurological events, and allergic reactions. Discussed the role of CRNA in patient's perioperative care. Patient understands.)         Anesthesia Quick Evaluation

## 2022-08-08 ENCOUNTER — Encounter: Payer: Self-pay | Admitting: Gastroenterology

## 2022-08-08 LAB — SURGICAL PATHOLOGY

## 2022-08-12 ENCOUNTER — Encounter: Payer: Self-pay | Admitting: Gastroenterology

## 2022-08-19 DIAGNOSIS — K080 Exfoliation of teeth due to systemic causes: Secondary | ICD-10-CM | POA: Diagnosis not present

## 2022-08-20 ENCOUNTER — Encounter: Payer: Self-pay | Admitting: Gastroenterology

## 2022-08-28 DIAGNOSIS — L570 Actinic keratosis: Secondary | ICD-10-CM | POA: Diagnosis not present

## 2022-08-28 DIAGNOSIS — L820 Inflamed seborrheic keratosis: Secondary | ICD-10-CM | POA: Diagnosis not present

## 2022-08-28 DIAGNOSIS — Z85820 Personal history of malignant melanoma of skin: Secondary | ICD-10-CM | POA: Diagnosis not present

## 2022-08-28 DIAGNOSIS — D224 Melanocytic nevi of scalp and neck: Secondary | ICD-10-CM | POA: Diagnosis not present

## 2022-08-28 DIAGNOSIS — D225 Melanocytic nevi of trunk: Secondary | ICD-10-CM | POA: Diagnosis not present

## 2022-08-28 DIAGNOSIS — L821 Other seborrheic keratosis: Secondary | ICD-10-CM | POA: Diagnosis not present

## 2022-08-31 ENCOUNTER — Other Ambulatory Visit: Payer: Self-pay | Admitting: Primary Care

## 2022-08-31 DIAGNOSIS — E039 Hypothyroidism, unspecified: Secondary | ICD-10-CM

## 2022-09-01 NOTE — Telephone Encounter (Signed)
Patient is due for CPE/follow up in mid July. Please schedule, thank you!

## 2022-09-02 NOTE — Telephone Encounter (Signed)
Spoke to pt, scheduled cpe for 10/22/22

## 2022-09-16 DIAGNOSIS — K080 Exfoliation of teeth due to systemic causes: Secondary | ICD-10-CM | POA: Diagnosis not present

## 2022-09-19 ENCOUNTER — Telehealth: Payer: Self-pay

## 2022-09-19 ENCOUNTER — Encounter: Payer: Self-pay | Admitting: Internal Medicine

## 2022-09-19 ENCOUNTER — Other Ambulatory Visit: Payer: Self-pay

## 2022-09-19 ENCOUNTER — Inpatient Hospital Stay (HOSPITAL_BASED_OUTPATIENT_CLINIC_OR_DEPARTMENT_OTHER): Payer: Medicare Other | Admitting: Internal Medicine

## 2022-09-19 ENCOUNTER — Other Ambulatory Visit: Payer: Self-pay | Admitting: *Deleted

## 2022-09-19 ENCOUNTER — Inpatient Hospital Stay: Payer: Medicare Other | Attending: Internal Medicine

## 2022-09-19 VITALS — BP 116/67 | HR 80 | Temp 97.8°F | Wt 158.8 lb

## 2022-09-19 DIAGNOSIS — Z791 Long term (current) use of non-steroidal anti-inflammatories (NSAID): Secondary | ICD-10-CM | POA: Diagnosis not present

## 2022-09-19 DIAGNOSIS — K219 Gastro-esophageal reflux disease without esophagitis: Secondary | ICD-10-CM | POA: Diagnosis not present

## 2022-09-19 DIAGNOSIS — Z79890 Hormone replacement therapy (postmenopausal): Secondary | ICD-10-CM | POA: Diagnosis not present

## 2022-09-19 DIAGNOSIS — K922 Gastrointestinal hemorrhage, unspecified: Secondary | ICD-10-CM | POA: Diagnosis not present

## 2022-09-19 DIAGNOSIS — Z79899 Other long term (current) drug therapy: Secondary | ICD-10-CM | POA: Insufficient documentation

## 2022-09-19 DIAGNOSIS — R0602 Shortness of breath: Secondary | ICD-10-CM | POA: Insufficient documentation

## 2022-09-19 DIAGNOSIS — D508 Other iron deficiency anemias: Secondary | ICD-10-CM

## 2022-09-19 DIAGNOSIS — D509 Iron deficiency anemia, unspecified: Secondary | ICD-10-CM

## 2022-09-19 DIAGNOSIS — E538 Deficiency of other specified B group vitamins: Secondary | ICD-10-CM | POA: Diagnosis not present

## 2022-09-19 DIAGNOSIS — M1990 Unspecified osteoarthritis, unspecified site: Secondary | ICD-10-CM | POA: Insufficient documentation

## 2022-09-19 DIAGNOSIS — M797 Fibromyalgia: Secondary | ICD-10-CM | POA: Insufficient documentation

## 2022-09-19 DIAGNOSIS — Z85820 Personal history of malignant melanoma of skin: Secondary | ICD-10-CM | POA: Insufficient documentation

## 2022-09-19 DIAGNOSIS — E785 Hyperlipidemia, unspecified: Secondary | ICD-10-CM | POA: Diagnosis not present

## 2022-09-19 DIAGNOSIS — E041 Nontoxic single thyroid nodule: Secondary | ICD-10-CM | POA: Insufficient documentation

## 2022-09-19 DIAGNOSIS — Z803 Family history of malignant neoplasm of breast: Secondary | ICD-10-CM | POA: Insufficient documentation

## 2022-09-19 DIAGNOSIS — Z7952 Long term (current) use of systemic steroids: Secondary | ICD-10-CM | POA: Insufficient documentation

## 2022-09-19 DIAGNOSIS — Z8719 Personal history of other diseases of the digestive system: Secondary | ICD-10-CM | POA: Diagnosis not present

## 2022-09-19 LAB — CBC WITH DIFFERENTIAL (CANCER CENTER ONLY)
Abs Immature Granulocytes: 0.01 10*3/uL (ref 0.00–0.07)
Basophils Absolute: 0 10*3/uL (ref 0.0–0.1)
Basophils Relative: 1 %
Eosinophils Absolute: 0.1 10*3/uL (ref 0.0–0.5)
Eosinophils Relative: 2 %
HCT: 23.8 % — ABNORMAL LOW (ref 36.0–46.0)
Hemoglobin: 6.9 g/dL — CL (ref 12.0–15.0)
Immature Granulocytes: 0 %
Lymphocytes Relative: 31 %
Lymphs Abs: 1.3 10*3/uL (ref 0.7–4.0)
MCH: 23.7 pg — ABNORMAL LOW (ref 26.0–34.0)
MCHC: 29 g/dL — ABNORMAL LOW (ref 30.0–36.0)
MCV: 81.8 fL (ref 80.0–100.0)
Monocytes Absolute: 0.4 10*3/uL (ref 0.1–1.0)
Monocytes Relative: 10 %
Neutro Abs: 2.3 10*3/uL (ref 1.7–7.7)
Neutrophils Relative %: 56 %
Platelet Count: 282 10*3/uL (ref 150–400)
RBC: 2.91 MIL/uL — ABNORMAL LOW (ref 3.87–5.11)
RDW: 18.2 % — ABNORMAL HIGH (ref 11.5–15.5)
WBC Count: 4.1 10*3/uL (ref 4.0–10.5)
nRBC: 0 % (ref 0.0–0.2)

## 2022-09-19 LAB — CBC WITH DIFFERENTIAL/PLATELET
Abs Immature Granulocytes: 0.01 10*3/uL (ref 0.00–0.07)
Basophils Absolute: 0 10*3/uL (ref 0.0–0.1)
Basophils Relative: 1 %
Eosinophils Absolute: 0.1 10*3/uL (ref 0.0–0.5)
Eosinophils Relative: 1 %
HCT: 23.2 % — ABNORMAL LOW (ref 36.0–46.0)
Hemoglobin: 6.7 g/dL — CL (ref 12.0–15.0)
Immature Granulocytes: 0 %
Lymphocytes Relative: 32 %
Lymphs Abs: 1.2 10*3/uL (ref 0.7–4.0)
MCH: 23.6 pg — ABNORMAL LOW (ref 26.0–34.0)
MCHC: 28.9 g/dL — ABNORMAL LOW (ref 30.0–36.0)
MCV: 81.7 fL (ref 80.0–100.0)
Monocytes Absolute: 0.4 10*3/uL (ref 0.1–1.0)
Monocytes Relative: 10 %
Neutro Abs: 2 10*3/uL (ref 1.7–7.7)
Neutrophils Relative %: 56 %
Platelets: 251 10*3/uL (ref 150–400)
RBC: 2.84 MIL/uL — ABNORMAL LOW (ref 3.87–5.11)
RDW: 17.9 % — ABNORMAL HIGH (ref 11.5–15.5)
WBC: 3.6 10*3/uL — ABNORMAL LOW (ref 4.0–10.5)
nRBC: 0 % (ref 0.0–0.2)

## 2022-09-19 LAB — PREPARE RBC (CROSSMATCH)

## 2022-09-19 LAB — IRON AND TIBC
Iron: 20 ug/dL — ABNORMAL LOW (ref 28–170)
Saturation Ratios: 3 % — ABNORMAL LOW (ref 10.4–31.8)
TIBC: 626 ug/dL — ABNORMAL HIGH (ref 250–450)
UIBC: 606 ug/dL

## 2022-09-19 LAB — BPAM RBC: Unit Type and Rh: 5100

## 2022-09-19 LAB — FERRITIN: Ferritin: 7 ng/mL — ABNORMAL LOW (ref 11–307)

## 2022-09-19 LAB — ABO/RH: ABO/RH(D): O POS

## 2022-09-19 LAB — TYPE AND SCREEN
ABO/RH(D): O POS
Antibody Screen: NEGATIVE

## 2022-09-19 NOTE — Progress Notes (Signed)
[facility]  Telephone:([phone] [phone]  ID: [name] [name] OB: Jul 29, 1935  MR#: [id number]  AOZ#:[id number]  Patient Care Team: Doreene Nest, NP as PCP - General (Internal Medicine) Antonieta Iba, MD as PCP - Cardiology (Cardiology)  HPI: [name] [name] is a 87 y.o. female with past medical history of hyperlipidemia, GERD, anxiety, fibromyalgia, osteoarthritis melanoma, MR was referred to hematology clinic for further management of iron deficiency anemia.  Patient reports shortness of breath on exertion for 1 month.  She is scheduled for echocardiogram.  She has occasional dizziness.  She has constant headache for 2 weeks.  She has visual disturbance with seeing wavy lines however that is chronic.  She was started on prednisone for 5 days by her primary.  Denies any nausea, vomiting, blurry vision or double vision.  Reports mild improvement with prednisone.  She denies any bleeding in stools or urine, gum or nose bleeding.  She has history of hysterectomy due to excessive bleeding in the past.  Denies any gastric bypass surgery.  She had right knee replacement in June 2023 when she required 1 unit of blood transfusion.  She was also started on oral iron which could not tolerate because of severe nausea.  She last had colonoscopy and endoscopy more than 15 years ago.  CBC from 01/08/2022 showed WBC 3.8, hemoglobin 7.7 and platelets 234.  From 10/10/2021 hemoglobin 10.7.  Iron panel showed ferritin 29, saturation 8.7%, TIBC elevated 599.  Last received IV Feraheme in December 2023.  Interval history Patient was seen today accompanied by daughter for labs and follow-up. Recently she has been feeling more short of breath.  Denies any bleeding in urine or stools.  Had endoscopy and colonoscopy with Dr. Tobi Bastos in April 2024, which showed hematin in the gastric antrum and 2 nonbleeding superficial gastric ulcers.  She was started on Prilosec 40 mg twice daily  which she reports compliance to.  Denies any recent NSAID use.  REVIEW OF SYSTEMS:   Review of Systems  Respiratory:  Positive for shortness of breath.     As per HPI. Otherwise, a complete review of systems is negative.  PAST MEDICAL HISTORY: Past Medical History:  Diagnosis Date   Anxiety    prev on prozac   Chest pain    Felt to be noncardiac in the past   Chronic rectal fissure    Colon polyp    Dyslipidemia    Fibromyalgia    GERD (gastroesophageal reflux disease)    Barrett's esophagus   Hiatal hernia    Lumbar spine pain    Complex lumbar spine surgery at [facility], 2012, complicated, MRSA, much of the appliance is removed, patient on chronic antibiotics   Melanoma (HCC)    Mitral regurgitation    a. 2007 Echo Mild MR; b. 10/2016 Echo: EF 55-60%, no rwma, GrI DD, Mild MR.   Osteoarthritis    Scoliosis    Sinusitis    Multiple sinus operations in the past   Statin intolerance    As of 2009, no further attempts to use statins   Thyroid nodule     PAST SURGICAL HISTORY: Past Surgical History:  Procedure Laterality Date   ABDOMINAL HYSTERECTOMY     BACK SURGERY     CHOLECYSTECTOMY     COLONOSCOPY WITH PROPOFOL N/A 08/07/2022   Procedure: COLONOSCOPY WITH PROPOFOL;  Surgeon: [name], MD;  Location: [facility] ENDOSCOPY;  Service: Gastroenterology;  Laterality: N/A;   ESOPHAGOGASTRODUODENOSCOPY (EGD) WITH PROPOFOL N/A  08/07/2022   Procedure: ESOPHAGOGASTRODUODENOSCOPY (EGD) WITH PROPOFOL;  Surgeon: [name], MD;  Location: [facility] ENDOSCOPY;  Service: Gastroenterology;  Laterality: N/A;   FOOT SURGERY     HERNIA REPAIR     KNEE SURGERY Right 08/08/2021   total knee replacement   NASAL SINUS SURGERY      FAMILY HISTORY: Family History  Problem Relation Age of Onset   GER disease Mother    Dementia Mother    Breast cancer Mother    Heart disease Father        MI at 58   Scoliosis Daughter    Colon cancer Neg Hx     HEALTH MAINTENANCE: Social History   Tobacco  Use   Smoking status: Never    Passive exposure: Never   Smokeless tobacco: Never  Vaping Use   Vaping Use: Never used  Substance Use Topics   Alcohol use: No   Drug use: No     Allergies  Allergen Reactions   Other Other (See Comments)    Other Reaction: when taking for an extended period of time causes mouth ulcers and esophagus irritation Other Reaction: when taking for an extended period of time causes mouth ulcers and esophagus irritation    Fenofibric Acid Other (See Comments)    GI upset   Nsaids     REACTION: No long term use   Statins Other (See Comments)    Intolerant, myalgias Intolerant, myalgias Intolerant, myalgias Intolerant, myalgias Intolerant, myalgias Intolerant, myalgias    Trilipix [Choline Fenofibrate]     GI upset   Wasp Venom Swelling    Current Outpatient Medications  Medication Sig Dispense Refill   ALPHA LIPOIC ACID PO Take 1 tablet by mouth daily. Daily     cholecalciferol (VITAMIN D) 1000 UNITS tablet Take 1,000 Units by mouth daily.     ciclopirox (LOPROX) 0.77 % cream Apply 1 g/mL topically 2 (two) times daily.     clotrimazole-betamethasone (LOTRISONE) cream APPLY TOPICALLY 2 (TWO) TIMES DAILY AS NEEDED. 30 g 0   fenofibrate micronized (LOFIBRA) 134 MG capsule TAKE 1 CAPSULE (134 MG TOTAL) BY MOUTH AT BEDTIME. FOR CHOLESTEROL. 90 capsule 2   fish oil-omega-3 fatty acids 1000 MG capsule Take 2 g by mouth 2 (two) times daily.     gabapentin (NEURONTIN) 100 MG capsule TAKE 1 CAPSULE BY MOUTH THREE TIMES A DAY 270 capsule 0   Ginger, Zingiber officinalis, (GINGER PO) Take 1 tablet by mouth daily.     levothyroxine (SYNTHROID) 50 MCG tablet TAKE 1 TABLET BY MOUTH EVERY MORNING WITH WATER ON AN EMPTY STOMACH. NO OTHER FOODS/MEDS FOR 30 MINS 90 tablet 0   metoprolol tartrate (LOPRESSOR) 25 MG tablet TAKE 1 TAB BY MOUTH TWICE A DAY AND EXTRA TABLET AS NEEDED FOR BREAKTHROUGH PALPITATIONS AS DIRECTED 180 tablet 3   Multiple Vitamins-Minerals  (MULTIVITAMIN WITH MINERALS) tablet Take 1 tablet by mouth daily.     NON FORMULARY Take 1 capsule by mouth daily. Tumeric      Daily     omeprazole (PRILOSEC OTC) 20 MG tablet Take 2 tablets (40 mg total) by mouth in the morning and at bedtime. 360 tablet 0   predniSONE (DELTASONE) 20 MG tablet Take 2 tablets by mouth once daily in the morning for 5 days. 10 tablet 0   sulfamethoxazole-trimethoprim (BACTRIM DS) 800-160 MG tablet TAKE 1 TABLET (160 MG OF TRIMETHOPRIM TOTAL) BY MOUTH 2 (TWO) TIMES DAILY 180 tablet 1   tretinoin (RETIN-A) 0.05 % cream Apply  1 application topically daily as needed (as directed per dermatologist).   3   calcium carbonate (OS-CAL) 600 MG TABS Take 1,200 mg by mouth 2 (two) times daily with a meal. (Patient not taking: Reported on 09/19/2022)     diclofenac sodium (VOLTAREN) 1 % GEL Apply 2-4 grams to affected area up to 4 times daily (Patient not taking: Reported on 09/19/2022) 4 Tube 2   No current facility-administered medications for this visit.    OBJECTIVE: Vitals:   09/19/22 1051  BP: 116/67  Pulse: 80  Temp: 97.8 F (36.6 C)  SpO2: 100%     Body mass index is 35.6 kg/m.      General: Well-developed, well-nourished, no acute distress. Eyes: Pink conjunctiva, anicteric sclera. HEENT: Normocephalic, moist mucous membranes, clear oropharnyx. Lungs: Clear to auscultation bilaterally. Heart: Regular rate and rhythm. No rubs, murmurs, or gallops. Abdomen: Soft, nontender, nondistended. No organomegaly noted, normoactive bowel sounds. Musculoskeletal: No edema, cyanosis, or clubbing. Neuro: Alert, answering all questions appropriately. Cranial nerves grossly intact. Skin: No rashes or petechiae noted. Psych: Normal affect. Lymphatics: No cervical, calvicular, axillary or inguinal LAD.   LAB RESULTS:  Lab Results  Component Value Date   NA 140 10/10/2021   K 4.2 10/10/2021   CL 104 10/10/2021   CO2 26 10/10/2021   GLUCOSE 101 (H) 10/10/2021   BUN 23  10/10/2021   CREATININE 0.97 10/10/2021   CALCIUM 9.1 10/10/2021   PROT 6.4 09/19/2020   ALBUMIN 4.5 09/19/2020   AST 18 09/19/2020   ALT 12 09/19/2020   ALKPHOS 50 09/19/2020   BILITOT 0.5 09/19/2020    Lab Results  Component Value Date   WBC 4.1 09/19/2022   NEUTROABS 2.3 09/19/2022   HGB 6.9 (LL) 09/19/2022   HCT 23.8 (L) 09/19/2022   MCV 81.8 09/19/2022   PLT 282 09/19/2022    Lab Results  Component Value Date   TIBC 626 (H) 09/19/2022   TIBC 536 (H) 06/10/2022   TIBC 498 (H) 03/11/2022   FERRITIN 7 (L) 09/19/2022   FERRITIN 35 06/10/2022   FERRITIN 37 03/11/2022   IRONPCTSAT 3 (L) 09/19/2022   IRONPCTSAT 16 06/10/2022   IRONPCTSAT 13 03/11/2022     STUDIES: No results found.  ASSESSMENT AND PLAN:   [name] [name] is a 87 y.o. female with pmh of hyperlipidemia, GERD, anxiety, fibromyalgia, osteoarthritis melanoma, MR was referred to hematology clinic for further management of iron deficiency anemia.  #Iron deficiency anemia # GI bleed -Could not tolerate oral iron due to nausea.  Completed IV Feraheme x 3 in December 2023.  Hemoglobin level has improved to 11.4.  Patient recently experiencing progressive shortness of breath.  Hemoglobin today down to 6.7.  Denies any bleeding in urine or stool.  Colonoscopy from April 2024 by Dr. Tobi Bastos showed few polyps with pathology showing tubular adenoma.  No repeat advised due to age.  Upper endoscopy showed hematin in gastric antrum and 2 superficial nonbleeding gastric ulcer.  She was started on Prilosec 40 mg twice daily which she reports compliance to.  I reached out to Dr. Tobi Bastos who advised capsule endoscopy to rule out any small bowel bleeding.  His office will call the patient to schedule the procedure.  -We will obtain type and screen today.  Scheduled for 1 unit PRBC tomorrow.  I will follow-up with her again on Monday to recheck CBC and possible blood transfusion.  She will be scheduled for IV Feraheme 550 mg weekly x 2  doses.  Iron panel showed ferritin of 7 today.  Patient was advised that if she notices any active bleeding in in stool or has worsening shortness of breath, dizziness, chest pain or feeling unwell in general should report to emergency room.  #Shortness of breath -Likely secondary to acute anemia  -Echo showed normal EF.  Chest x-ray was unremarkable.  CT chest was done which was unremarkable.  # Vitamin B12 deficiency -B12 from September 2023 165. S/p IM cyanocobalamin 1000 mcg X 4 -On B12 supplements 1000 mcg.  B12 level improved to 1100.  Orders Placed This Encounter  Procedures   CBC with Differential (Cancer Center Only)   CBC with Differential (Cancer Center Only)   Informed Consent Details: Physician/Practitioner Attestation; Transcribe to consent form and obtain patient signature   Care order/instruction   Type and screen   ABO/Rh   Type and screen   Prepare RBC (crossmatch)   Scheduled for 1 unit PRBC tomorrow RTC for MD visit, labs, possible blood transfusion on Monday Schedule for IV Feraheme weekly x 2 doses  Patient expressed understanding and was in agreement with this plan. She also understands that She can call clinic at any time with any questions, concerns, or complaints.   I spent a total of 30 minutes reviewing chart data, face-to-face evaluation with the patient, counseling and coordination of care as detailed above.  [name], MD   09/19/2022 1:09 PM

## 2022-09-19 NOTE — Telephone Encounter (Signed)
Dr A made aware of critical lab Hgb of 6.7.

## 2022-09-20 ENCOUNTER — Inpatient Hospital Stay: Payer: Medicare Other

## 2022-09-20 DIAGNOSIS — E538 Deficiency of other specified B group vitamins: Secondary | ICD-10-CM | POA: Diagnosis not present

## 2022-09-20 DIAGNOSIS — M1990 Unspecified osteoarthritis, unspecified site: Secondary | ICD-10-CM | POA: Diagnosis not present

## 2022-09-20 DIAGNOSIS — Z7952 Long term (current) use of systemic steroids: Secondary | ICD-10-CM | POA: Diagnosis not present

## 2022-09-20 DIAGNOSIS — Z85820 Personal history of malignant melanoma of skin: Secondary | ICD-10-CM | POA: Diagnosis not present

## 2022-09-20 DIAGNOSIS — Z79890 Hormone replacement therapy (postmenopausal): Secondary | ICD-10-CM | POA: Diagnosis not present

## 2022-09-20 DIAGNOSIS — M797 Fibromyalgia: Secondary | ICD-10-CM | POA: Diagnosis not present

## 2022-09-20 DIAGNOSIS — D509 Iron deficiency anemia, unspecified: Secondary | ICD-10-CM

## 2022-09-20 DIAGNOSIS — Z8719 Personal history of other diseases of the digestive system: Secondary | ICD-10-CM | POA: Diagnosis not present

## 2022-09-20 DIAGNOSIS — K219 Gastro-esophageal reflux disease without esophagitis: Secondary | ICD-10-CM | POA: Diagnosis not present

## 2022-09-20 DIAGNOSIS — K922 Gastrointestinal hemorrhage, unspecified: Secondary | ICD-10-CM

## 2022-09-20 DIAGNOSIS — R0602 Shortness of breath: Secondary | ICD-10-CM | POA: Diagnosis not present

## 2022-09-20 DIAGNOSIS — Z79899 Other long term (current) drug therapy: Secondary | ICD-10-CM | POA: Diagnosis not present

## 2022-09-20 DIAGNOSIS — Z803 Family history of malignant neoplasm of breast: Secondary | ICD-10-CM | POA: Diagnosis not present

## 2022-09-20 DIAGNOSIS — E785 Hyperlipidemia, unspecified: Secondary | ICD-10-CM | POA: Diagnosis not present

## 2022-09-20 DIAGNOSIS — E041 Nontoxic single thyroid nodule: Secondary | ICD-10-CM | POA: Diagnosis not present

## 2022-09-20 DIAGNOSIS — Z791 Long term (current) use of non-steroidal anti-inflammatories (NSAID): Secondary | ICD-10-CM | POA: Diagnosis not present

## 2022-09-20 LAB — BPAM RBC: Blood Product Expiration Date: 202407122359

## 2022-09-20 LAB — TYPE AND SCREEN: Unit division: 0

## 2022-09-20 MED ORDER — SODIUM CHLORIDE 0.9% IV SOLUTION
250.0000 mL | Freq: Once | INTRAVENOUS | Status: AC
  Administered 2022-09-20: 250 mL via INTRAVENOUS
  Filled 2022-09-20: qty 250

## 2022-09-20 NOTE — Addendum Note (Signed)
Addended byMichaelyn Barter on: 09/20/2022 08:35 AM   Modules accepted: Orders

## 2022-09-21 LAB — TYPE AND SCREEN

## 2022-09-21 LAB — BPAM RBC: Unit Type and Rh: 5100

## 2022-09-23 ENCOUNTER — Inpatient Hospital Stay: Payer: Medicare Other

## 2022-09-23 ENCOUNTER — Encounter: Payer: Self-pay | Admitting: Internal Medicine

## 2022-09-23 ENCOUNTER — Other Ambulatory Visit: Payer: Self-pay

## 2022-09-23 ENCOUNTER — Inpatient Hospital Stay (HOSPITAL_BASED_OUTPATIENT_CLINIC_OR_DEPARTMENT_OTHER): Payer: Medicare Other | Admitting: Internal Medicine

## 2022-09-23 VITALS — BP 116/50 | HR 75 | Temp 97.6°F | Wt 158.0 lb

## 2022-09-23 DIAGNOSIS — Z791 Long term (current) use of non-steroidal anti-inflammatories (NSAID): Secondary | ICD-10-CM | POA: Diagnosis not present

## 2022-09-23 DIAGNOSIS — Z85820 Personal history of malignant melanoma of skin: Secondary | ICD-10-CM | POA: Diagnosis not present

## 2022-09-23 DIAGNOSIS — E538 Deficiency of other specified B group vitamins: Secondary | ICD-10-CM | POA: Diagnosis not present

## 2022-09-23 DIAGNOSIS — D509 Iron deficiency anemia, unspecified: Secondary | ICD-10-CM

## 2022-09-23 DIAGNOSIS — Z803 Family history of malignant neoplasm of breast: Secondary | ICD-10-CM | POA: Diagnosis not present

## 2022-09-23 DIAGNOSIS — K219 Gastro-esophageal reflux disease without esophagitis: Secondary | ICD-10-CM | POA: Diagnosis not present

## 2022-09-23 DIAGNOSIS — Z8719 Personal history of other diseases of the digestive system: Secondary | ICD-10-CM | POA: Diagnosis not present

## 2022-09-23 DIAGNOSIS — E785 Hyperlipidemia, unspecified: Secondary | ICD-10-CM | POA: Diagnosis not present

## 2022-09-23 DIAGNOSIS — D508 Other iron deficiency anemias: Secondary | ICD-10-CM

## 2022-09-23 DIAGNOSIS — D500 Iron deficiency anemia secondary to blood loss (chronic): Secondary | ICD-10-CM | POA: Diagnosis not present

## 2022-09-23 DIAGNOSIS — Z7952 Long term (current) use of systemic steroids: Secondary | ICD-10-CM | POA: Diagnosis not present

## 2022-09-23 DIAGNOSIS — E041 Nontoxic single thyroid nodule: Secondary | ICD-10-CM | POA: Diagnosis not present

## 2022-09-23 DIAGNOSIS — M797 Fibromyalgia: Secondary | ICD-10-CM | POA: Diagnosis not present

## 2022-09-23 DIAGNOSIS — Z79899 Other long term (current) drug therapy: Secondary | ICD-10-CM | POA: Diagnosis not present

## 2022-09-23 DIAGNOSIS — M1990 Unspecified osteoarthritis, unspecified site: Secondary | ICD-10-CM | POA: Diagnosis not present

## 2022-09-23 DIAGNOSIS — R0602 Shortness of breath: Secondary | ICD-10-CM | POA: Diagnosis not present

## 2022-09-23 DIAGNOSIS — Z79890 Hormone replacement therapy (postmenopausal): Secondary | ICD-10-CM | POA: Diagnosis not present

## 2022-09-23 LAB — CBC WITH DIFFERENTIAL (CANCER CENTER ONLY)
Abs Immature Granulocytes: 0.01 10*3/uL (ref 0.00–0.07)
Basophils Absolute: 0 10*3/uL (ref 0.0–0.1)
Basophils Relative: 1 %
Eosinophils Absolute: 0.1 10*3/uL (ref 0.0–0.5)
Eosinophils Relative: 4 %
HCT: 28.3 % — ABNORMAL LOW (ref 36.0–46.0)
Hemoglobin: 8.2 g/dL — ABNORMAL LOW (ref 12.0–15.0)
Immature Granulocytes: 0 %
Lymphocytes Relative: 29 %
Lymphs Abs: 0.8 10*3/uL (ref 0.7–4.0)
MCH: 23.7 pg — ABNORMAL LOW (ref 26.0–34.0)
MCHC: 29 g/dL — ABNORMAL LOW (ref 30.0–36.0)
MCV: 81.8 fL (ref 80.0–100.0)
Monocytes Absolute: 0.3 10*3/uL (ref 0.1–1.0)
Monocytes Relative: 11 %
Neutro Abs: 1.5 10*3/uL — ABNORMAL LOW (ref 1.7–7.7)
Neutrophils Relative %: 55 %
Platelet Count: 233 10*3/uL (ref 150–400)
RBC: 3.46 MIL/uL — ABNORMAL LOW (ref 3.87–5.11)
RDW: 18.3 % — ABNORMAL HIGH (ref 11.5–15.5)
WBC Count: 2.8 10*3/uL — ABNORMAL LOW (ref 4.0–10.5)
nRBC: 0 % (ref 0.0–0.2)

## 2022-09-23 LAB — TYPE AND SCREEN
ABO/RH(D): O POS
Antibody Screen: NEGATIVE

## 2022-09-23 NOTE — Progress Notes (Signed)
Patient had a blood transfusion on 09/19/2022 and since then she says that she was able to sleep a little better, plus she felt better all in all.

## 2022-09-23 NOTE — Progress Notes (Signed)
[facility]  Telephone:(336) [phone] Fax:(336) [phone]  ID: [name] [name] OB: 07-Jul-1935  MR#: [id number]  AOZ#:[id number]  Patient Care Team: Doreene Nest, NP as PCP - General (Internal Medicine) Antonieta Iba, MD as PCP - Cardiology (Cardiology)  HPI: [name] [name] is a 87 y.o. female with past medical history of hyperlipidemia, GERD, anxiety, fibromyalgia, osteoarthritis melanoma, MR was referred to hematology clinic for further management of iron deficiency anemia.  Patient reports shortness of breath on exertion for 1 month.  She is scheduled for echocardiogram.  She has occasional dizziness.  She has constant headache for 2 weeks.  She has visual disturbance with seeing wavy lines however that is chronic.  She was started on prednisone for 5 days by her primary.  Denies any nausea, vomiting, blurry vision or double vision.  Reports mild improvement with prednisone.  She denies any bleeding in stools or urine, gum or nose bleeding.  She has history of hysterectomy due to excessive bleeding in the past.  Denies any gastric bypass surgery.  She had right knee replacement in June 2023 when she required 1 unit of blood transfusion.  She was also started on oral iron which could not tolerate because of severe nausea.  She last had colonoscopy and endoscopy more than 15 years ago.  CBC from 01/08/2022 showed WBC 3.8, hemoglobin 7.7 and platelets 234.  From 10/10/2021 hemoglobin 10.7.  Iron panel showed ferritin 29, saturation 8.7%, TIBC elevated 599.  Last received IV Feraheme in December 2023.  Interval history Patient was seen today accompanied by daughter for labs and follow-up.  She received 1 unit of PRBC last Thursday.  Her hemoglobin has improved from 6.7-8.2.  She also reports feeling better.  Her shortness of breath has mildly improved and able to sleep better.  Color has improved.  Has not scheduled capsule endoscopy yet.  Continues to deny any visible  bleeding in urine or stool.  REVIEW OF SYSTEMS:   Review of Systems  Respiratory:  Positive for shortness of breath.     As per HPI. Otherwise, a complete review of systems is negative.  PAST MEDICAL HISTORY: Past Medical History:  Diagnosis Date   Anxiety    prev on prozac   Chest pain    Felt to be noncardiac in the past   Chronic rectal fissure    Colon polyp    Dyslipidemia    Fibromyalgia    GERD (gastroesophageal reflux disease)    Barrett's esophagus   Hiatal hernia    Lumbar spine pain    Complex lumbar spine surgery at [facility], 2012, complicated, MRSA, much of the appliance is removed, patient on chronic antibiotics   Melanoma (HCC)    Mitral regurgitation    a. 2007 Echo Mild MR; b. 10/2016 Echo: EF 55-60%, no rwma, GrI DD, Mild MR.   Osteoarthritis    Scoliosis    Sinusitis    Multiple sinus operations in the past   Statin intolerance    As of 2009, no further attempts to use statins   Thyroid nodule     PAST SURGICAL HISTORY: Past Surgical History:  Procedure Laterality Date   ABDOMINAL HYSTERECTOMY     BACK SURGERY     CHOLECYSTECTOMY     COLONOSCOPY WITH PROPOFOL N/A 08/07/2022   Procedure: COLONOSCOPY WITH PROPOFOL;  Surgeon: [name], MD;  Location: [facility] ENDOSCOPY;  Service: Gastroenterology;  Laterality: N/A;   ESOPHAGOGASTRODUODENOSCOPY (EGD) WITH PROPOFOL N/A 08/07/2022   Procedure: ESOPHAGOGASTRODUODENOSCOPY (  EGD) WITH PROPOFOL;  Surgeon: [name], MD;  Location: [facility] ENDOSCOPY;  Service: Gastroenterology;  Laterality: N/A;   FOOT SURGERY     HERNIA REPAIR     KNEE SURGERY Right 08/08/2021   total knee replacement   NASAL SINUS SURGERY      FAMILY HISTORY: Family History  Problem Relation Age of Onset   GER disease Mother    Dementia Mother    Breast cancer Mother    Heart disease Father        MI at 87   Scoliosis Daughter    Colon cancer Neg Hx     HEALTH MAINTENANCE: Social History   Tobacco Use   Smoking status: Never     Passive exposure: Never   Smokeless tobacco: Never  Vaping Use   Vaping Use: Never used  Substance Use Topics   Alcohol use: No   Drug use: No     Allergies  Allergen Reactions   Other Other (See Comments)    Other Reaction: when taking for an extended period of time causes mouth ulcers and esophagus irritation Other Reaction: when taking for an extended period of time causes mouth ulcers and esophagus irritation    Fenofibric Acid Other (See Comments)    GI upset   Nsaids     REACTION: No long term use   Statins Other (See Comments)    Intolerant, myalgias Intolerant, myalgias Intolerant, myalgias Intolerant, myalgias Intolerant, myalgias Intolerant, myalgias    Trilipix [Choline Fenofibrate]     GI upset   Wasp Venom Swelling    Current Outpatient Medications  Medication Sig Dispense Refill   ALPHA LIPOIC ACID PO Take 1 tablet by mouth daily. Daily     amoxicillin (AMOXIL) 500 MG capsule Take 500 mg by mouth 3 (three) times daily.     cholecalciferol (VITAMIN D) 1000 UNITS tablet Take 1,000 Units by mouth daily.     ciclopirox (LOPROX) 0.77 % cream Apply 1 g/mL topically 2 (two) times daily.     clotrimazole-betamethasone (LOTRISONE) cream APPLY TOPICALLY 2 (TWO) TIMES DAILY AS NEEDED. 30 g 0   diclofenac sodium (VOLTAREN) 1 % GEL Apply 2-4 grams to affected area up to 4 times daily 4 Tube 2   fenofibrate micronized (LOFIBRA) 134 MG capsule TAKE 1 CAPSULE (134 MG TOTAL) BY MOUTH AT BEDTIME. FOR CHOLESTEROL. 90 capsule 2   fish oil-omega-3 fatty acids 1000 MG capsule Take 2 g by mouth 2 (two) times daily.     gabapentin (NEURONTIN) 100 MG capsule TAKE 1 CAPSULE BY MOUTH THREE TIMES A DAY 270 capsule 0   Ginger, Zingiber officinalis, (GINGER PO) Take 1 tablet by mouth daily.     levothyroxine (SYNTHROID) 50 MCG tablet TAKE 1 TABLET BY MOUTH EVERY MORNING WITH WATER ON AN EMPTY STOMACH. NO OTHER FOODS/MEDS FOR 30 MINS 90 tablet 0   metoprolol tartrate (LOPRESSOR) 25 MG  tablet TAKE 1 TAB BY MOUTH TWICE A DAY AND EXTRA TABLET AS NEEDED FOR BREAKTHROUGH PALPITATIONS AS DIRECTED 180 tablet 3   Multiple Vitamins-Minerals (MULTIVITAMIN WITH MINERALS) tablet Take 1 tablet by mouth daily.     NON FORMULARY Take 1 capsule by mouth daily. Tumeric      Daily     omeprazole (PRILOSEC OTC) 20 MG tablet Take 2 tablets (40 mg total) by mouth in the morning and at bedtime. 360 tablet 0   predniSONE (DELTASONE) 20 MG tablet Take 2 tablets by mouth once daily in the morning for 5 days. 10  tablet 0   sulfamethoxazole-trimethoprim (BACTRIM DS) 800-160 MG tablet TAKE 1 TABLET (160 MG OF TRIMETHOPRIM TOTAL) BY MOUTH 2 (TWO) TIMES DAILY 180 tablet 1   tretinoin (RETIN-A) 0.05 % cream Apply 1 application topically daily as needed (as directed per dermatologist).   3   calcium carbonate (OS-CAL) 600 MG TABS Take 1,200 mg by mouth 2 (two) times daily with a meal. (Patient not taking: Reported on 09/19/2022)     No current facility-administered medications for this visit.    OBJECTIVE: Vitals:   09/23/22 1019  BP: (!) 116/50  Pulse: 75  Temp: 97.6 F (36.4 C)  SpO2: 96%     Body mass index is 35.42 kg/m.      General: Well-developed, well-nourished, no acute distress. Eyes: Pink conjunctiva, anicteric sclera. HEENT: Normocephalic, moist mucous membranes, clear oropharnyx. Lungs: Clear to auscultation bilaterally. Heart: Regular rate and rhythm. No rubs, murmurs, or gallops. Abdomen: Soft, nontender, nondistended. No organomegaly noted, normoactive bowel sounds. Musculoskeletal: No edema, cyanosis, or clubbing. Neuro: Alert, answering all questions appropriately. Cranial nerves grossly intact. Skin: No rashes or petechiae noted. Psych: Normal affect. Lymphatics: No cervical, calvicular, axillary or inguinal LAD.   LAB RESULTS:  Lab Results  Component Value Date   NA 140 10/10/2021   K 4.2 10/10/2021   CL 104 10/10/2021   CO2 26 10/10/2021   GLUCOSE 101 (H) 10/10/2021    BUN 23 10/10/2021   CREATININE 0.97 10/10/2021   CALCIUM 9.1 10/10/2021   PROT 6.4 09/19/2020   ALBUMIN 4.5 09/19/2020   AST 18 09/19/2020   ALT 12 09/19/2020   ALKPHOS 50 09/19/2020   BILITOT 0.5 09/19/2020    Lab Results  Component Value Date   WBC 2.8 (L) 09/23/2022   NEUTROABS 1.5 (L) 09/23/2022   HGB 8.2 (L) 09/23/2022   HCT 28.3 (L) 09/23/2022   MCV 81.8 09/23/2022   PLT 233 09/23/2022    Lab Results  Component Value Date   TIBC 626 (H) 09/19/2022   TIBC 536 (H) 06/10/2022   TIBC 498 (H) 03/11/2022   FERRITIN 7 (L) 09/19/2022   FERRITIN 35 06/10/2022   FERRITIN 37 03/11/2022   IRONPCTSAT 3 (L) 09/19/2022   IRONPCTSAT 16 06/10/2022   IRONPCTSAT 13 03/11/2022     STUDIES: No results found.  ASSESSMENT AND PLAN:   [name] [name] is a 87 y.o. female with pmh of hyperlipidemia, GERD, anxiety, fibromyalgia, osteoarthritis melanoma, MR was referred to hematology clinic for further management of iron deficiency anemia.  #Iron deficiency anemia # GI bleed -Could not tolerate oral iron due to nausea.  Completed IV Feraheme x 3 in December 2023.  Hemoglobin level has improved to 11.4.  Patient recently experiencing progressive shortness of breath.  Hemoglobin today down to 6.7.  Denies any bleeding in urine or stool.  Colonoscopy from April 2024 by Dr. Tobi Bastos showed few polyps with pathology showing tubular adenoma.  No repeat advised due to age.  Upper endoscopy showed hematin in gastric antrum and 2 superficial nonbleeding gastric ulcer.  She was started on Prilosec 40 mg twice daily which she reports compliance to.    - s/p 1 unit PRBC on 09/20/2022.  Hemoglobin has improved from 6.7-8.2 today.  Patient reports some improvement in her symptoms.  Will proceed with IV Feraheme weekly x 2 doses which is already scheduled.  I reached out to GI again about scheduling for capsule endoscopy.  Dr. Johnney Killian office did call patient last week but she did not answer.  Patient was informed  to expect a phone call from the office later today.  #Shortness of breath -Likely secondary to acute anemia.  Improved -Echo showed normal EF.  Chest x-ray was unremarkable.  CT chest was done which was unremarkable.  # Vitamin B12 deficiency -B12 from September 2023 165. S/p IM cyanocobalamin 1000 mcg X 4 -On B12 supplements 1000 mcg.  B12 level improved to 1100.  Orders Placed This Encounter  Procedures   CBC with Differential/Platelet   Iron and TIBC   Ferritin   RTC in 1 month for MD visit, labs, possible Feraheme  Patient expressed understanding and was in agreement with this plan. She also understands that She can call clinic at any time with any questions, concerns, or complaints.   I spent a total of 25 minutes reviewing chart data, face-to-face evaluation with the patient, counseling and coordination of care as detailed above.  [name], MD   09/23/2022 10:56 AM

## 2022-09-24 ENCOUNTER — Inpatient Hospital Stay: Payer: Medicare Other

## 2022-09-24 ENCOUNTER — Encounter: Payer: Self-pay | Admitting: Internal Medicine

## 2022-09-24 ENCOUNTER — Ambulatory Visit: Payer: BC Managed Care – PPO

## 2022-09-25 ENCOUNTER — Inpatient Hospital Stay: Payer: Medicare Other

## 2022-09-25 VITALS — BP 120/58 | HR 71 | Temp 99.0°F | Resp 18

## 2022-09-25 DIAGNOSIS — Z791 Long term (current) use of non-steroidal anti-inflammatories (NSAID): Secondary | ICD-10-CM | POA: Diagnosis not present

## 2022-09-25 DIAGNOSIS — Z7952 Long term (current) use of systemic steroids: Secondary | ICD-10-CM | POA: Diagnosis not present

## 2022-09-25 DIAGNOSIS — Z79899 Other long term (current) drug therapy: Secondary | ICD-10-CM | POA: Diagnosis not present

## 2022-09-25 DIAGNOSIS — Z803 Family history of malignant neoplasm of breast: Secondary | ICD-10-CM | POA: Diagnosis not present

## 2022-09-25 DIAGNOSIS — M1990 Unspecified osteoarthritis, unspecified site: Secondary | ICD-10-CM | POA: Diagnosis not present

## 2022-09-25 DIAGNOSIS — Z79890 Hormone replacement therapy (postmenopausal): Secondary | ICD-10-CM | POA: Diagnosis not present

## 2022-09-25 DIAGNOSIS — D509 Iron deficiency anemia, unspecified: Secondary | ICD-10-CM | POA: Diagnosis not present

## 2022-09-25 DIAGNOSIS — E785 Hyperlipidemia, unspecified: Secondary | ICD-10-CM | POA: Diagnosis not present

## 2022-09-25 DIAGNOSIS — M797 Fibromyalgia: Secondary | ICD-10-CM | POA: Diagnosis not present

## 2022-09-25 DIAGNOSIS — E538 Deficiency of other specified B group vitamins: Secondary | ICD-10-CM | POA: Diagnosis not present

## 2022-09-25 DIAGNOSIS — D649 Anemia, unspecified: Secondary | ICD-10-CM

## 2022-09-25 DIAGNOSIS — K219 Gastro-esophageal reflux disease without esophagitis: Secondary | ICD-10-CM | POA: Diagnosis not present

## 2022-09-25 DIAGNOSIS — Z8719 Personal history of other diseases of the digestive system: Secondary | ICD-10-CM | POA: Diagnosis not present

## 2022-09-25 DIAGNOSIS — Z85820 Personal history of malignant melanoma of skin: Secondary | ICD-10-CM | POA: Diagnosis not present

## 2022-09-25 DIAGNOSIS — E041 Nontoxic single thyroid nodule: Secondary | ICD-10-CM | POA: Diagnosis not present

## 2022-09-25 DIAGNOSIS — R0602 Shortness of breath: Secondary | ICD-10-CM | POA: Diagnosis not present

## 2022-09-25 MED ORDER — SODIUM CHLORIDE 0.9 % IV SOLN
Freq: Once | INTRAVENOUS | Status: AC
  Filled 2022-09-25: qty 250

## 2022-09-25 MED ORDER — SODIUM CHLORIDE 0.9 % IV SOLN
510.0000 mg | Freq: Once | INTRAVENOUS | Status: AC
  Administered 2022-09-25: 510 mg via INTRAVENOUS
  Filled 2022-09-25: qty 510

## 2022-09-25 MED ORDER — CYANOCOBALAMIN 1000 MCG/ML IJ SOLN: 1000.0000 ug | INTRAMUSCULAR | Status: DC

## 2022-09-29 ENCOUNTER — Other Ambulatory Visit: Payer: Self-pay | Admitting: Primary Care

## 2022-09-29 ENCOUNTER — Other Ambulatory Visit: Payer: Self-pay | Admitting: Orthopaedic Surgery

## 2022-09-29 DIAGNOSIS — E039 Hypothyroidism, unspecified: Secondary | ICD-10-CM

## 2022-09-29 DIAGNOSIS — E785 Hyperlipidemia, unspecified: Secondary | ICD-10-CM

## 2022-10-01 ENCOUNTER — Inpatient Hospital Stay: Payer: Medicare Other

## 2022-10-01 ENCOUNTER — Other Ambulatory Visit: Payer: Self-pay | Admitting: Internal Medicine

## 2022-10-01 VITALS — BP 129/57 | HR 75 | Temp 97.7°F | Resp 16

## 2022-10-01 DIAGNOSIS — E538 Deficiency of other specified B group vitamins: Secondary | ICD-10-CM | POA: Diagnosis not present

## 2022-10-01 DIAGNOSIS — M797 Fibromyalgia: Secondary | ICD-10-CM | POA: Diagnosis not present

## 2022-10-01 DIAGNOSIS — Z803 Family history of malignant neoplasm of breast: Secondary | ICD-10-CM | POA: Diagnosis not present

## 2022-10-01 DIAGNOSIS — E785 Hyperlipidemia, unspecified: Secondary | ICD-10-CM | POA: Diagnosis not present

## 2022-10-01 DIAGNOSIS — Z7952 Long term (current) use of systemic steroids: Secondary | ICD-10-CM | POA: Diagnosis not present

## 2022-10-01 DIAGNOSIS — E041 Nontoxic single thyroid nodule: Secondary | ICD-10-CM | POA: Diagnosis not present

## 2022-10-01 DIAGNOSIS — M1990 Unspecified osteoarthritis, unspecified site: Secondary | ICD-10-CM | POA: Diagnosis not present

## 2022-10-01 DIAGNOSIS — Z85820 Personal history of malignant melanoma of skin: Secondary | ICD-10-CM | POA: Diagnosis not present

## 2022-10-01 DIAGNOSIS — R0602 Shortness of breath: Secondary | ICD-10-CM | POA: Diagnosis not present

## 2022-10-01 DIAGNOSIS — K219 Gastro-esophageal reflux disease without esophagitis: Secondary | ICD-10-CM | POA: Diagnosis not present

## 2022-10-01 DIAGNOSIS — Z8719 Personal history of other diseases of the digestive system: Secondary | ICD-10-CM | POA: Diagnosis not present

## 2022-10-01 DIAGNOSIS — D509 Iron deficiency anemia, unspecified: Secondary | ICD-10-CM | POA: Diagnosis not present

## 2022-10-01 DIAGNOSIS — Z791 Long term (current) use of non-steroidal anti-inflammatories (NSAID): Secondary | ICD-10-CM | POA: Diagnosis not present

## 2022-10-01 DIAGNOSIS — D649 Anemia, unspecified: Secondary | ICD-10-CM

## 2022-10-01 DIAGNOSIS — Z79890 Hormone replacement therapy (postmenopausal): Secondary | ICD-10-CM | POA: Diagnosis not present

## 2022-10-01 DIAGNOSIS — Z79899 Other long term (current) drug therapy: Secondary | ICD-10-CM | POA: Diagnosis not present

## 2022-10-01 MED ORDER — SODIUM CHLORIDE 0.9 % IV SOLN
510.0000 mg | Freq: Once | INTRAVENOUS | Status: AC
  Administered 2022-10-01: 510 mg via INTRAVENOUS
  Filled 2022-10-01: qty 17

## 2022-10-01 MED ORDER — SODIUM CHLORIDE 0.9 % IV SOLN
Freq: Once | INTRAVENOUS | Status: AC
  Filled 2022-10-01: qty 250

## 2022-10-07 ENCOUNTER — Ambulatory Visit (INDEPENDENT_AMBULATORY_CARE_PROVIDER_SITE_OTHER): Payer: Medicare Other

## 2022-10-07 VITALS — Ht <= 58 in | Wt 150.0 lb

## 2022-10-07 DIAGNOSIS — Z0000 Encounter for general adult medical examination without abnormal findings: Secondary | ICD-10-CM

## 2022-10-07 NOTE — Progress Notes (Signed)
Subjective:   [name] [name] is a 87 y.o. female who presents for Medicare Annual (Subsequent) preventive examination.  Visit Complete: Virtual  I connected with  [name] on 10/07/22 by a audio enabled telemedicine application and verified that I am speaking with the correct person using two identifiers.  Patient Location: Home  Provider Location: Home Office  I discussed the limitations of evaluation and management by telemedicine. The patient expressed understanding and agreed to proceed.   Review of Systems      Cardiac Risk Factors include: advanced age (>21men, >34 women);dyslipidemia     Objective:    Today's Vitals   10/07/22 0911  Weight: 150 lb (68 kg)  Height: 4\' 8"  (1.422 m)  PainSc: 6    Body mass index is 33.63 kg/m.     10/07/2022    9:23 AM 09/25/2022    3:38 PM 09/23/2022   10:17 AM 09/23/2022   10:12 AM 09/19/2022   10:43 AM 06/10/2022    3:26 PM 03/11/2022   10:23 AM  Advanced Directives  Does Patient Have a Medical Advance Directive? No No No No No No No  Would patient like information on creating a medical advance directive? No - Patient declined No - Patient declined    No - Patient declined No - Patient declined    Current Medications (verified) Outpatient Encounter Medications as of 10/07/2022  Medication Sig   ALPHA LIPOIC ACID PO Take 1 tablet by mouth daily. Daily   cholecalciferol (VITAMIN D) 1000 UNITS tablet Take 1,000 Units by mouth daily.   ciclopirox (LOPROX) 0.77 % cream Apply 1 g/mL topically 2 (two) times daily.   clotrimazole-betamethasone (LOTRISONE) cream APPLY TOPICALLY 2 (TWO) TIMES DAILY AS NEEDED.   fenofibrate micronized (LOFIBRA) 134 MG capsule TAKE 1 CAPSULE (134 MG TOTAL) BY MOUTH AT BEDTIME. FOR CHOLESTEROL.   fish oil-omega-3 fatty acids 1000 MG capsule Take 2 g by mouth 2 (two) times daily.   gabapentin (NEURONTIN) 100 MG capsule TAKE 1 CAPSULE BY MOUTH THREE TIMES A DAY   Ginger, Zingiber officinalis, (GINGER PO)  Take 1 tablet by mouth daily.   levothyroxine (SYNTHROID) 50 MCG tablet TAKE 1 TABLET BY MOUTH EVERY MORNING WITH WATER ON AN EMPTY STOMACH. NO OTHER FOODS/MEDS FOR 30 MINS   metoprolol tartrate (LOPRESSOR) 25 MG tablet TAKE 1 TAB BY MOUTH TWICE A DAY AND EXTRA TABLET AS NEEDED FOR BREAKTHROUGH PALPITATIONS AS DIRECTED   Multiple Vitamins-Minerals (MULTIVITAMIN WITH MINERALS) tablet Take 1 tablet by mouth daily.   NON FORMULARY Take 1 capsule by mouth daily. Tumeric      Daily   omeprazole (PRILOSEC OTC) 20 MG tablet Take 2 tablets (40 mg total) by mouth in the morning and at bedtime.   sulfamethoxazole-trimethoprim (BACTRIM DS) 800-160 MG tablet TAKE 1 TABLET (160 MG OF TRIMETHOPRIM TOTAL) BY MOUTH 2 (TWO) TIMES DAILY   tretinoin (RETIN-A) 0.05 % cream Apply 1 application topically daily as needed (as directed per dermatologist).    amoxicillin (AMOXIL) 500 MG capsule Take 500 mg by mouth 3 (three) times daily. (Patient not taking: Reported on 10/07/2022)   calcium carbonate (OS-CAL) 600 MG TABS Take 1,200 mg by mouth 2 (two) times daily with a meal. (Patient not taking: Reported on 09/19/2022)   diclofenac sodium (VOLTAREN) 1 % GEL Apply 2-4 grams to affected area up to 4 times daily (Patient not taking: Reported on 10/07/2022)   predniSONE (DELTASONE) 20 MG tablet Take 2 tablets by mouth once daily in  the morning for 5 days. (Patient not taking: Reported on 10/07/2022)   No facility-administered encounter medications on file as of 10/07/2022.    Allergies (verified) Other, Fenofibric acid, Nsaids, Statins, Trilipix [choline fenofibrate], and Wasp venom   History: Past Medical History:  Diagnosis Date   Anxiety    prev on prozac   Chest pain    Felt to be noncardiac in the past   Chronic rectal fissure    Colon polyp    Dyslipidemia    Fibromyalgia    GERD (gastroesophageal reflux disease)    Barrett's esophagus   Hiatal hernia    Lumbar spine pain    Complex lumbar spine surgery at  [facility], 2012, complicated, MRSA, much of the appliance is removed, patient on chronic antibiotics   Melanoma (HCC)    Mitral regurgitation    a. 2007 Echo Mild MR; b. 10/2016 Echo: EF 55-60%, no rwma, GrI DD, Mild MR.   Osteoarthritis    Scoliosis    Sinusitis    Multiple sinus operations in the past   Statin intolerance    As of 2009, no further attempts to use statins   Thyroid nodule    Past Surgical History:  Procedure Laterality Date   ABDOMINAL HYSTERECTOMY     BACK SURGERY     CHOLECYSTECTOMY     COLONOSCOPY WITH PROPOFOL N/A 08/07/2022   Procedure: COLONOSCOPY WITH PROPOFOL;  Surgeon: [name], MD;  Location: [facility] ENDOSCOPY;  Service: Gastroenterology;  Laterality: N/A;   ESOPHAGOGASTRODUODENOSCOPY (EGD) WITH PROPOFOL N/A 08/07/2022   Procedure: ESOPHAGOGASTRODUODENOSCOPY (EGD) WITH PROPOFOL;  Surgeon: [name], MD;  Location: [facility] ENDOSCOPY;  Service: Gastroenterology;  Laterality: N/A;   FOOT SURGERY     HERNIA REPAIR     KNEE SURGERY Right 08/08/2021   total knee replacement   NASAL SINUS SURGERY     Family History  Problem Relation Age of Onset   GER disease Mother    Dementia Mother    Breast cancer Mother    Heart disease Father        MI at 28   Scoliosis Daughter    Colon cancer Neg Hx    Social History   Socioeconomic History   Marital status: Married    Spouse name: Not on file   Number of children: Not on file   Years of education: Not on file   Highest education level: Not on file  Occupational History   Not on file  Tobacco Use   Smoking status: Never    Passive exposure: Never   Smokeless tobacco: Never  Vaping Use   Vaping Use: Never used  Substance and Sexual Activity   Alcohol use: No   Drug use: No   Sexual activity: Not on file  Other Topics Concern   Not on file  Social History Narrative   From [place]   Married (second [phone]   3 kids, 2 of whom are local   Retired, Tree surgeon   Social Determinants of  Health   Financial Resource Strain: Low Risk  (10/07/2022)   Overall Financial Resource Strain (CARDIA)    Difficulty of Paying Living Expenses: Not hard at all  Food Insecurity: No Food Insecurity (10/07/2022)   Hunger Vital Sign    Worried About Running Out of Food in the Last Year: Never true    Ran Out of Food in the Last Year: Never true  Transportation Needs: No Transportation Needs (10/07/2022)   PRAPARE - Transportation  Lack of Transportation (Medical): No    Lack of Transportation (Non-Medical): No  Physical Activity: Sufficiently Active (10/07/2022)   Exercise Vital Sign    Days of Exercise per Week: 3 days    Minutes of Exercise per Session: 50 min  Stress: No Stress Concern Present (10/07/2022)   [employer] of Occupational Health - Occupational Stress Questionnaire    Feeling of Stress : Not at all  Social Connections: Moderately Isolated (10/07/2022)   Social Connection and Isolation Panel [NHANES]    Frequency of Communication with Friends and Family: More than three times a week    Frequency of Social Gatherings with Friends and Family: More than three times a week    Attends Religious Services: Never    Database administrator or Organizations: No    Attends Engineer, structural: Never    Marital Status: Married    Tobacco Counseling Counseling given: Not Answered   Clinical Intake:  Pre-visit preparation completed: Yes  Pain : 0-10 Pain Score: 6  Pain Location: Generalized Pain Descriptors / Indicators: Aching Pain Onset: Other (comment) (years)     BMI - recorded: 33.63 Nutritional Risks: None Diabetes: No  How often do you need to have someone help you when you read instructions, pamphlets, or other written materials from your doctor or pharmacy?: 1 - Never  Interpreter Needed?: No  Information entered by :: C.Natoya Viscomi LPN   Activities of Daily Living    10/07/2022    9:24 AM 10/17/2021    9:28 AM  In your present state of  health, do you have any difficulty performing the following activities:  Hearing? 0 0  Vision? 0 0  Difficulty concentrating or making decisions? 1 0  Comment occasionally   Walking or climbing stairs? 1 0  Comment Knee and back pain   Dressing or bathing? 0 0  Doing errands, shopping? 0 0  Preparing Food and eating ? N   Using the Toilet? N   In the past six months, have you accidently leaked urine? N   Do you have problems with loss of bowel control? N   Managing your Medications? N   Managing your Finances? N   Housekeeping or managing your Housekeeping? N     Patient Care Team: Doreene Nest, NP as PCP - General (Internal Medicine) Antonieta Iba, MD as PCP - Cardiology (Cardiology)  Indicate any recent Medical Services you may have received from other than [facility] providers in the past year (date may be approximate).     Assessment:   This is a routine wellness examination for Kyndal.  Hearing/Vision screen Hearing Screening - Comments:: No hearing difficulty Vision Screening - Comments:: [facility] on eye exams  Dietary issues and exercise activities discussed:     Goals Addressed             This Visit's Progress    Patient Stated       Lose weight       Depression Screen    10/07/2022    9:22 AM 10/17/2021    9:28 AM 10/02/2021   11:23 AM 09/19/2020   10:35 AM 03/01/2020    9:59 AM 02/05/2019    9:05 AM 02/03/2018    9:02 AM  PHQ 2/9 Scores  PHQ - 2 Score 0 0 0 0 0 0 0  PHQ- 9 Score  0  0 0 0     Fall Risk    10/07/2022    9:24 AM  10/17/2021    9:29 AM 10/02/2021   11:26 AM 09/26/2020    9:32 AM 09/19/2020   10:33 AM  Fall Risk   Falls in the past year? 0 0 0 1 1  Number falls in past yr: 0 0 0 1 0  Injury with Fall? 0  0 1 1  Risk for fall due to : No Fall Risks  No Fall Risks  Medication side effect  Follow up Falls prevention discussed;Falls evaluation completed    Falls evaluation completed;Falls prevention discussed     MEDICARE RISK AT HOME:  Medicare Risk at Home - 10/07/22 0925     Any stairs in or around the home? Yes    If so, are there any without handrails? No   has chair lift   Home free of loose throw rugs in walkways, pet beds, electrical cords, etc? Yes    Adequate lighting in your home to reduce risk of falls? Yes    Life alert? No    Use of a cane, walker or w/c? Yes   walker   Grab bars in the bathroom? Yes    Shower chair or bench in shower? Yes    Elevated toilet seat or a handicapped toilet? No                Cognitive Function:    09/19/2020   10:39 AM 02/05/2019    9:07 AM 01/27/2017   10:14 AM  MMSE - Mini Mental State Exam  Not completed: Refused    Orientation to time  5 5  Orientation to Place  5 5  Registration  3 3  Attention/ Calculation  5 0  Recall  3 3  Language- name 2 objects   0  Language- repeat  1 1  Language- follow 3 step command   3  Language- read & follow direction   0  Write a sentence   0  Copy design   0  Total score   20        10/07/2022    9:28 AM 10/02/2021   11:28 AM  6CIT Screen  What Year? 0 points 0 points  What month? 0 points 0 points  What time? 0 points 0 points  Count back from 20 0 points 0 points  Months in reverse 0 points 0 points  Repeat phrase 0 points 0 points  Total Score 0 points 0 points    Immunizations Immunization History  Administered Date(s) Administered   Fluad Quad(high Dose 65+) 03/01/2020, 01/08/2022   Influenza Split 02/03/2012   Influenza,inj,Quad PF,6+ Mos 02/20/2017, 02/03/2018, 02/10/2019   Influenza-Unspecified 02/02/2016, 01/13/2017, 02/02/2018, 01/08/2022   Moderna Covid-19 Vaccine Bivalent Booster 35yrs & up 01/23/2021   Moderna Sars-Covid-2 Vaccination 04/26/2019, 05/25/2019, 02/11/2020, 08/01/2020   Pneumococcal Conjugate-13 02/10/2019   Pneumococcal Polysaccharide-23 02/03/2018   Td 02/03/2016   Tdap 10/22/2017    TDAP status: Up to date  Flu Vaccine status: Up to  date  Pneumococcal vaccine status: Up to date  Covid-19 vaccine status: Completed vaccines  Qualifies for Shingles Vaccine? Yes   Zostavax completed  unknown   Shingrix Completed?: No.    Education has been provided regarding the importance of this vaccine. Patient has been advised to call insurance company to determine out of pocket expense if they have not yet received this vaccine. Advised may also receive vaccine at local pharmacy or Health Dept. Verbalized acceptance and understanding.  Screening Tests Health Maintenance  Topic Date Due  Zoster Vaccines- Shingrix (1 of 2) Never done   COVID-19 Vaccine (6 - 2023-24 season) 12/14/2021   INFLUENZA VACCINE  11/14/2022   Medicare Annual Wellness (AWV)  10/07/2023   DTaP/Tdap/Td (3 - Td or Tdap) 10/23/2027   Pneumonia Vaccine 47+ Years old  Completed   DEXA SCAN  Completed   HPV VACCINES  Aged Out    Health Maintenance  Health Maintenance Due  Topic Date Due   Zoster Vaccines- Shingrix (1 of 2) Never done   COVID-19 Vaccine (6 - 2023-24 season) 12/14/2021    Colorectal cancer screening: No longer required.   Mammogram status: Completed 04/17/22. Repeat every year  Bone Density status: Completed 01/15/21. Results reflect: Bone density results: OSTEOPENIA. Repeat every 2 years. Followed by rheumatology.  Lung Cancer Screening: (Low Dose CT Chest recommended if Age 69-80 years, 20 pack-year currently smoking OR have quit w/in 15years.) does not qualify.   Lung Cancer Screening Referral: N/A  Additional Screening:  Hepatitis C Screening: does not qualify; Completed N/A  Vision Screening: Recommended annual ophthalmology exams for early detection of glaucoma and other disorders of the eye. Is the patient up to date with their annual eye exam?  Yes  Who is the provider or what is the name of the office in which the patient attends annual eye exams? [facility] If pt is not established with a provider, would they like to be  referred to a provider to establish care? Yes .   Dental Screening: Recommended annual dental exams for proper oral hygiene   Community Resource Referral / Chronic Care Management: CRR required this visit?  Yes   CCM required this visit?  No     Plan:     I have personally reviewed and noted the following in the patient's chart:   Medical and social history Use of alcohol, tobacco or illicit drugs  Current medications and supplements including opioid prescriptions. Patient is not currently taking opioid prescriptions. Functional ability and status Nutritional status Physical activity Advanced directives List of other physicians Hospitalizations, surgeries, and ER visits in previous 12 months Vitals Screenings to include cognitive, depression, and falls Referrals and appointments  In addition, I have reviewed and discussed with patient certain preventive protocols, quality metrics, and best practice recommendations. A written personalized care plan for preventive services as well as general preventive health recommendations were provided to patient.     Maryan Puls, LPN   [id number]   After Visit Summary: (MyChart) Due to this being a telephonic visit, the after visit summary with patients personalized plan was offered to patient via MyChart   Nurse Notes: none

## 2022-10-07 NOTE — Patient Instructions (Signed)
[name] [name] , Thank you for taking time to come for your Medicare Wellness Visit. I appreciate your ongoing commitment to your health goals. Please review the following plan we discussed and let me know if I can assist you in the future.   These are the goals we discussed:  Goals       Health management      Starting 01/27/2017, I will continue to exercise for 60 min 3 days per week, to drink 5-6 glasses of water daily, and to take medications as prescribed.       Patient Stated      02/05/2019, I will exercise more daily and go to the gym.       Patient Stated      09/19/2020, I will continue to ride my recumbent bike 3 days a week for 30 minutes.       Patient stated (pt-stated)      I would like to lose a little weight.      Patient Stated      Lose weight        This is a list of the screening recommended for you and due dates:  Health Maintenance  Topic Date Due   Zoster (Shingles) Vaccine (1 of 2) Never done   COVID-19 Vaccine (6 - 2023-24 season) 12/14/2021   Flu Shot  11/14/2022   Medicare Annual Wellness Visit  10/07/2023   DTaP/Tdap/Td vaccine (3 - Td or Tdap) 10/23/2027   Pneumonia Vaccine  Completed   DEXA scan (bone density measurement)  Completed   HPV Vaccine  Aged Out    Advanced directives: none  Conditions/risks identified: none  Next appointment: Follow up in one year for your annual wellness visit 10/08/23 @ 8:45 telephone   Preventive Care 65 Years and Older, Female Preventive care refers to lifestyle choices and visits with your health care provider that can promote health and wellness. What does preventive care include? A yearly physical exam. This is also called an annual well check. Dental exams once or twice a year. Routine eye exams. Ask your health care provider how often you should have your eyes checked. Personal lifestyle choices, including: Daily care of your teeth and gums. Regular physical activity. Eating a healthy diet. Avoiding  tobacco and drug use. Limiting alcohol use. Practicing safe sex. Taking low-dose aspirin every day. Taking vitamin and mineral supplements as recommended by your health care provider. What happens during an annual well check? The services and screenings done by your health care provider during your annual well check will depend on your age, overall health, lifestyle risk factors, and family history of disease. Counseling  Your health care provider may ask you questions about your: Alcohol use. Tobacco use. Drug use. Emotional well-being. Home and relationship well-being. Sexual activity. Eating habits. History of falls. Memory and ability to understand (cognition). Work and work Astronomer. Reproductive health. Screening  You may have the following tests or measurements: Height, weight, and BMI. Blood pressure. Lipid and cholesterol levels. These may be checked every 5 years, or more frequently if you are over 69 years old. Skin check. Lung cancer screening. You may have this screening every year starting at age 58 if you have a 30-pack-year history of smoking and currently smoke or have quit within the past 15 years. Fecal occult blood test (FOBT) of the stool. You may have this test every year starting at age 68. Flexible sigmoidoscopy or colonoscopy. You may have a sigmoidoscopy every 5 years or  a colonoscopy every 10 years starting at age 20. Hepatitis C blood test. Hepatitis B blood test. Sexually transmitted disease (STD) testing. Diabetes screening. This is done by checking your blood sugar (glucose) after you have not eaten for a while (fasting). You may have this done every 1-3 years. Bone density scan. This is done to screen for osteoporosis. You may have this done starting at age 67. Mammogram. This may be done every 1-2 years. Talk to your health care provider about how often you should have regular mammograms. Talk with your health care provider about your test  results, treatment options, and if necessary, the need for more tests. Vaccines  Your health care provider may recommend certain vaccines, such as: Influenza vaccine. This is recommended every year. Tetanus, diphtheria, and acellular pertussis (Tdap, Td) vaccine. You may need a Td booster every 10 years. Zoster vaccine. You may need this after age 74. Pneumococcal 13-valent conjugate (PCV13) vaccine. One dose is recommended after age 25. Pneumococcal polysaccharide (PPSV23) vaccine. One dose is recommended after age 85. Talk to your health care provider about which screenings and vaccines you need and how often you need them. This information is not intended to replace advice given to you by your health care provider. Make sure you discuss any questions you have with your health care provider. Document Released: 04/28/2015 Document Revised: 12/20/2015 Document Reviewed: 01/31/2015 Elsevier Interactive Patient Education  2017 [employer].  Fall Prevention in the Home Falls can cause injuries. They can happen to people of all ages. There are many things you can do to make your home safe and to help prevent falls. What can I do on the outside of my home? Regularly fix the edges of walkways and driveways and fix any cracks. Remove anything that might make you trip as you walk through a door, such as a raised step or threshold. Trim any bushes or trees on the path to your home. Use bright outdoor lighting. Clear any walking paths of anything that might make someone trip, such as rocks or tools. Regularly check to see if handrails are loose or broken. Make sure that both sides of any steps have handrails. Any raised decks and porches should have guardrails on the edges. Have any leaves, snow, or ice cleared regularly. Use sand or salt on walking paths during winter. Clean up any spills in your garage right away. This includes oil or grease spills. What can I do in the bathroom? Use night  lights. Install grab bars by the toilet and in the tub and shower. Do not use towel bars as grab bars. Use non-skid mats or decals in the tub or shower. If you need to sit down in the shower, use a plastic, non-slip stool. Keep the floor dry. Clean up any water that spills on the floor as soon as it happens. Remove soap buildup in the tub or shower regularly. Attach bath mats securely with double-sided non-slip rug tape. Do not have throw rugs and other things on the floor that can make you trip. What can I do in the bedroom? Use night lights. Make sure that you have a light by your bed that is easy to reach. Do not use any sheets or blankets that are too big for your bed. They should not hang down onto the floor. Have a firm chair that has side arms. You can use this for support while you get dressed. Do not have throw rugs and other things on the floor that  can make you trip. What can I do in the kitchen? Clean up any spills right away. Avoid walking on wet floors. Keep items that you use a lot in easy-to-reach places. If you need to reach something above you, use a strong step stool that has a grab bar. Keep electrical cords out of the way. Do not use floor polish or wax that makes floors slippery. If you must use wax, use non-skid floor wax. Do not have throw rugs and other things on the floor that can make you trip. What can I do with my stairs? Do not leave any items on the stairs. Make sure that there are handrails on both sides of the stairs and use them. Fix handrails that are broken or loose. Make sure that handrails are as long as the stairways. Check any carpeting to make sure that it is firmly attached to the stairs. Fix any carpet that is loose or worn. Avoid having throw rugs at the top or bottom of the stairs. If you do have throw rugs, attach them to the floor with carpet tape. Make sure that you have a light switch at the top of the stairs and the bottom of the stairs. If  you do not have them, ask someone to add them for you. What else can I do to help prevent falls? Wear shoes that: Do not have high heels. Have rubber bottoms. Are comfortable and fit you well. Are closed at the toe. Do not wear sandals. If you use a stepladder: Make sure that it is fully opened. Do not climb a closed stepladder. Make sure that both sides of the stepladder are locked into place. Ask someone to hold it for you, if possible. Clearly mark and make sure that you can see: Any grab bars or handrails. First and last steps. Where the edge of each step is. Use tools that help you move around (mobility aids) if they are needed. These include: Canes. Walkers. Scooters. Crutches. Turn on the lights when you go into a dark area. Replace any light bulbs as soon as they burn out. Set up your furniture so you have a clear path. Avoid moving your furniture around. If any of your floors are uneven, fix them. If there are any pets around you, be aware of where they are. Review your medicines with your doctor. Some medicines can make you feel dizzy. This can increase your chance of falling. Ask your doctor what other things that you can do to help prevent falls. This information is not intended to replace advice given to you by your health care provider. Make sure you discuss any questions you have with your health care provider. Document Released: 01/26/2009 Document Revised: 09/07/2015 Document Reviewed: 05/06/2014 Elsevier Interactive Patient Education  2017 [employer].

## 2022-10-08 ENCOUNTER — Other Ambulatory Visit: Payer: BC Managed Care – PPO

## 2022-10-08 ENCOUNTER — Ambulatory Visit: Payer: BC Managed Care – PPO | Admitting: Internal Medicine

## 2022-10-08 ENCOUNTER — Other Ambulatory Visit: Payer: Self-pay | Admitting: Primary Care

## 2022-10-08 DIAGNOSIS — E039 Hypothyroidism, unspecified: Secondary | ICD-10-CM

## 2022-10-08 DIAGNOSIS — E785 Hyperlipidemia, unspecified: Secondary | ICD-10-CM

## 2022-10-09 ENCOUNTER — Ambulatory Visit
Admission: RE | Admit: 2022-10-09 | Discharge: 2022-10-09 | Disposition: A | Discharge status: Home / Self Care | Payer: Medicare Other | Attending: Gastroenterology | Admitting: Gastroenterology

## 2022-10-09 ENCOUNTER — Encounter
Admission: RE | Disposition: A | Discharge status: Home / Self Care | Payer: Self-pay | Source: Home / Self Care | Attending: Gastroenterology

## 2022-10-09 DIAGNOSIS — K922 Gastrointestinal hemorrhage, unspecified: Secondary | ICD-10-CM | POA: Diagnosis not present

## 2022-10-09 DIAGNOSIS — D509 Iron deficiency anemia, unspecified: Secondary | ICD-10-CM | POA: Insufficient documentation

## 2022-10-09 DIAGNOSIS — D500 Iron deficiency anemia secondary to blood loss (chronic): Secondary | ICD-10-CM | POA: Diagnosis not present

## 2022-10-09 HISTORY — PX: GIVENS CAPSULE STUDY: SHX5432

## 2022-10-09 SURGERY — IMAGING PROCEDURE, GI TRACT, INTRALUMINAL, VIA CAPSULE

## 2022-10-10 ENCOUNTER — Encounter: Payer: Self-pay | Admitting: Gastroenterology

## 2022-10-15 ENCOUNTER — Other Ambulatory Visit (INDEPENDENT_AMBULATORY_CARE_PROVIDER_SITE_OTHER): Payer: Medicare Other

## 2022-10-15 DIAGNOSIS — E039 Hypothyroidism, unspecified: Secondary | ICD-10-CM | POA: Diagnosis not present

## 2022-10-15 DIAGNOSIS — E785 Hyperlipidemia, unspecified: Secondary | ICD-10-CM | POA: Diagnosis not present

## 2022-10-15 LAB — COMPREHENSIVE METABOLIC PANEL
ALT: 14 U/L (ref 0–35)
AST: 20 U/L (ref 0–37)
Albumin: 4.3 g/dL (ref 3.5–5.2)
Alkaline Phosphatase: 52 U/L (ref 39–117)
BUN: 19 mg/dL (ref 6–23)
CO2: 27 mEq/L (ref 19–32)
Calcium: 9.4 mg/dL (ref 8.4–10.5)
Chloride: 104 mEq/L (ref 96–112)
Creatinine, Ser: 0.9 mg/dL (ref 0.40–1.20)
GFR: 58.32 mL/min — ABNORMAL LOW (ref >60.00)
Glucose, Bld: 95 mg/dL (ref 70–99)
Potassium: 4.2 mEq/L (ref 3.5–5.1)
Sodium: 140 mEq/L (ref 135–145)
Total Bilirubin: 0.4 mg/dL (ref 0.2–1.2)
Total Protein: 6.2 g/dL (ref 6.0–8.3)

## 2022-10-15 LAB — LIPID PANEL
Cholesterol: 152 mg/dL (ref 0–200)
HDL: 48.6 mg/dL (ref >39.00)
LDL Cholesterol: 67 mg/dL (ref 0–99)
NonHDL: 103.34
Total CHOL/HDL Ratio: 3
Triglycerides: 181 mg/dL — ABNORMAL HIGH (ref 0.0–149.0)
VLDL: 36.2 mg/dL (ref 0.0–40.0)

## 2022-10-15 LAB — HEMOGLOBIN A1C: Hgb A1c MFr Bld: 4 % — ABNORMAL LOW (ref 4.6–6.5)

## 2022-10-15 LAB — TSH: TSH: 3.55 u[IU]/mL (ref 0.35–5.50)

## 2022-10-21 ENCOUNTER — Inpatient Hospital Stay: Payer: Medicare Other

## 2022-10-21 ENCOUNTER — Inpatient Hospital Stay: Payer: Medicare Other | Admitting: Internal Medicine

## 2022-10-21 MED FILL — Ferumoxytol Inj 510 MG/17ML (30 MG/ML) (Elemental Fe): INTRAVENOUS | Qty: 17 | Status: AC

## 2022-10-22 ENCOUNTER — Ambulatory Visit (INDEPENDENT_AMBULATORY_CARE_PROVIDER_SITE_OTHER): Payer: Medicare Other | Admitting: Primary Care

## 2022-10-22 ENCOUNTER — Telehealth: Payer: Self-pay

## 2022-10-22 ENCOUNTER — Encounter: Payer: Self-pay | Admitting: Primary Care

## 2022-10-22 ENCOUNTER — Ambulatory Visit (INDEPENDENT_AMBULATORY_CARE_PROVIDER_SITE_OTHER)
Admission: RE | Admit: 2022-10-22 | Discharge: 2022-10-22 | Disposition: A | Discharge status: Home / Self Care | Payer: Medicare Other | Source: Ambulatory Visit | Attending: Primary Care | Admitting: Primary Care

## 2022-10-22 VITALS — BP 104/60 | HR 71 | Temp 97.4°F | Ht <= 58 in | Wt 155.0 lb

## 2022-10-22 DIAGNOSIS — M19072 Primary osteoarthritis, left ankle and foot: Secondary | ICD-10-CM | POA: Diagnosis not present

## 2022-10-22 DIAGNOSIS — M150 Primary generalized (osteo)arthritis: Secondary | ICD-10-CM

## 2022-10-22 DIAGNOSIS — M7989 Other specified soft tissue disorders: Secondary | ICD-10-CM | POA: Diagnosis not present

## 2022-10-22 DIAGNOSIS — Z0001 Encounter for general adult medical examination with abnormal findings: Secondary | ICD-10-CM

## 2022-10-22 DIAGNOSIS — R519 Headache, unspecified: Secondary | ICD-10-CM

## 2022-10-22 DIAGNOSIS — Z8614 Personal history of Methicillin resistant Staphylococcus aureus infection: Secondary | ICD-10-CM

## 2022-10-22 DIAGNOSIS — M79672 Pain in left foot: Secondary | ICD-10-CM

## 2022-10-22 DIAGNOSIS — M2042 Other hammer toe(s) (acquired), left foot: Secondary | ICD-10-CM | POA: Diagnosis not present

## 2022-10-22 DIAGNOSIS — R0609 Other forms of dyspnea: Secondary | ICD-10-CM

## 2022-10-22 DIAGNOSIS — E039 Hypothyroidism, unspecified: Secondary | ICD-10-CM | POA: Diagnosis not present

## 2022-10-22 DIAGNOSIS — D649 Anemia, unspecified: Secondary | ICD-10-CM

## 2022-10-22 DIAGNOSIS — R002 Palpitations: Secondary | ICD-10-CM

## 2022-10-22 DIAGNOSIS — Z0000 Encounter for general adult medical examination without abnormal findings: Secondary | ICD-10-CM

## 2022-10-22 DIAGNOSIS — E785 Hyperlipidemia, unspecified: Secondary | ICD-10-CM

## 2022-10-22 DIAGNOSIS — N289 Disorder of kidney and ureter, unspecified: Secondary | ICD-10-CM

## 2022-10-22 DIAGNOSIS — M2012 Hallux valgus (acquired), left foot: Secondary | ICD-10-CM | POA: Diagnosis not present

## 2022-10-22 DIAGNOSIS — Z792 Long term (current) use of antibiotics: Secondary | ICD-10-CM

## 2022-10-22 DIAGNOSIS — M159 Polyosteoarthritis, unspecified: Secondary | ICD-10-CM

## 2022-10-22 DIAGNOSIS — K219 Gastro-esophageal reflux disease without esophagitis: Secondary | ICD-10-CM

## 2022-10-22 DIAGNOSIS — F411 Generalized anxiety disorder: Secondary | ICD-10-CM

## 2022-10-22 DIAGNOSIS — M797 Fibromyalgia: Secondary | ICD-10-CM

## 2022-10-22 MED ORDER — SULFAMETHOXAZOLE-TRIMETHOPRIM 800-160 MG PO TABS: ORAL_TABLET | ORAL | 3 refills | Status: DC

## 2022-10-22 MED ORDER — AMITRIPTYLINE HCL 10 MG PO TABS
10.0000 mg | ORAL_TABLET | Freq: Every day | ORAL | 0 refills | Status: DC
Start: 2022-10-22 — End: 2023-01-19

## 2022-10-22 NOTE — Telephone Encounter (Signed)
Pt called requesting results of capsule study... I explained to pt that Dr Tobi Bastos has been out of the office... Pt expressed understanding

## 2022-10-22 NOTE — Progress Notes (Addendum)
Subjective:    Patient ID: [name] [name], female    DOB: [birth year], 87 y.o.   MRN: [id number]  HPI  [name] [name] is a very pleasant 87 y.o. female who presents today for complete physical and follow up of chronic conditions.  She would also like to discuss acute foot pain. This morning she was in the laundry room and a towel twisted around her left foot which hyperextended her 2nd toe. Since then she's noticed bruising to the 2nd toe and now to the MTP area of the 2nd toe.   She continues to experience frequent headaches which are located to the mid occipital lobe with radiation occasionally to the mid parietal lobes.  She was once initiated on Topamax for headache prevention, but this caused diarrhea.  She is currently managed on metoprolol for palpitations.  She has yet to try amitriptyline.  Immunizations: -Tetanus: Completed in 2019 -Shingles: Never completed  -Pneumonia: Completed Prevnar 13 in 2020, pneumovax in 2019  Diet: Fair diet.  Exercise: No regular exercise.  Eye exam: Completes annually  Dental exam: Completes semi-annually    Mammogram: January 2024 Bone Density Scan: 2022  Colonoscopy: Completed in 2024  BP Readings from Last 3 Encounters:  10/22/22 104/60  10/01/22 (!) 129/57  09/25/22 (!) 120/58       Review of Systems  Constitutional:  Negative for unexpected weight change.  HENT:  Negative for rhinorrhea.   Respiratory:  Negative for cough and shortness of breath.   Cardiovascular:  Negative for chest pain.  Gastrointestinal:  Negative for constipation and diarrhea.  Genitourinary:  Negative for difficulty urinating and menstrual problem.  Musculoskeletal:  Positive for arthralgias.  Skin:  Positive for color change. Negative for rash.  Allergic/Immunologic: Negative for environmental allergies.  Neurological:  Negative for dizziness, numbness and headaches.         Past Medical History:  Diagnosis Date   Anxiety    prev on prozac    Chest pain    Felt to be noncardiac in the past   Chronic rectal fissure    Colon polyp    Dyslipidemia    Fibromyalgia    GERD (gastroesophageal reflux disease)    Barrett's esophagus   Hiatal hernia    Lumbar spine pain    Complex lumbar spine surgery at [facility], 2012, complicated, MRSA, much of the appliance is removed, patient on chronic antibiotics   Melanoma (HCC)    Mitral regurgitation    a. 2007 Echo Mild MR; b. 10/2016 Echo: EF 55-60%, no rwma, GrI DD, Mild MR.   Nausea and vomiting 08/22/2021   Osteoarthritis    Scoliosis    Sinusitis    Multiple sinus operations in the past   Statin intolerance    As of 2009, no further attempts to use statins   Thyroid nodule     Social History   Socioeconomic History   Marital status: Married    Spouse name: Not on file   Number of children: Not on file   Years of education: Not on file   Highest education level: Not on file  Occupational History   Not on file  Tobacco Use   Smoking status: Never    Passive exposure: Never   Smokeless tobacco: Never  Vaping Use   Vaping Use: Never used  Substance and Sexual Activity   Alcohol use: No   Drug use: No   Sexual activity: Not on file  Other Topics Concern   Not on  file  Social History Narrative   From [place]   Married (second [phone]   3 kids, 2 of whom are local   Retired, Tree surgeon   Social Determinants of Health   Financial Resource Strain: Low Risk  (10/07/2022)   Overall Financial Resource Strain (CARDIA)    Difficulty of Paying Living Expenses: Not hard at all  Food Insecurity: No Food Insecurity (10/07/2022)   Hunger Vital Sign    Worried About Running Out of Food in the Last Year: Never true    Ran Out of Food in the Last Year: Never true  Transportation Needs: No Transportation Needs (10/07/2022)   PRAPARE - Administrator, Civil Service (Medical): No    Lack of Transportation (Non-Medical): No  Physical Activity: Sufficiently  Active (10/07/2022)   Exercise Vital Sign    Days of Exercise per Week: 3 days    Minutes of Exercise per Session: 50 min  Stress: No Stress Concern Present (10/07/2022)   [employer] of Occupational Health - Occupational Stress Questionnaire    Feeling of Stress : Not at all  Social Connections: Moderately Isolated (10/07/2022)   Social Connection and Isolation Panel [NHANES]    Frequency of Communication with Friends and Family: More than three times a week    Frequency of Social Gatherings with Friends and Family: More than three times a week    Attends Religious Services: Never    Database administrator or Organizations: No    Attends Banker Meetings: Never    Marital Status: Married  Catering manager Violence: Not At Risk (10/07/2022)   Humiliation, Afraid, Rape, and Kick questionnaire    Fear of Current or Ex-Partner: No    Emotionally Abused: No    Physically Abused: No    Sexually Abused: No    Past Surgical History:  Procedure Laterality Date   ABDOMINAL HYSTERECTOMY     BACK SURGERY     CHOLECYSTECTOMY     COLONOSCOPY WITH PROPOFOL N/A 08/07/2022   Procedure: COLONOSCOPY WITH PROPOFOL;  Surgeon: [name], MD;  Location: [facility] ENDOSCOPY;  Service: Gastroenterology;  Laterality: N/A;   ESOPHAGOGASTRODUODENOSCOPY (EGD) WITH PROPOFOL N/A 08/07/2022   Procedure: ESOPHAGOGASTRODUODENOSCOPY (EGD) WITH PROPOFOL;  Surgeon: [name], MD;  Location: [facility] ENDOSCOPY;  Service: Gastroenterology;  Laterality: N/A;   FOOT SURGERY     GIVENS CAPSULE STUDY N/A 10/09/2022   Procedure: GIVENS CAPSULE STUDY;  Surgeon: [name], MD;  Location: [facility] ENDOSCOPY;  Service: Gastroenterology;  Laterality: N/A;   HERNIA REPAIR     KNEE SURGERY Right 08/08/2021   total knee replacement   NASAL SINUS SURGERY      Family History  Problem Relation Age of Onset   GER disease Mother    Dementia Mother    Breast cancer Mother    Heart disease Father        MI at 32    Scoliosis Daughter    Colon cancer Neg Hx     Allergies  Allergen Reactions   Other Other (See Comments)    Other Reaction: when taking for an extended period of time causes mouth ulcers and esophagus irritation Other Reaction: when taking for an extended period of time causes mouth ulcers and esophagus irritation    Fenofibric Acid Other (See Comments)    GI upset   Nsaids     REACTION: No long term use   Statins Other (See Comments)    Intolerant, myalgias Intolerant, myalgias Intolerant,  myalgias Intolerant, myalgias Intolerant, myalgias Intolerant, myalgias    Trilipix [Choline Fenofibrate]     GI upset   Wasp Venom Swelling    Current Outpatient Medications on File Prior to Visit  Medication Sig Dispense Refill   ALPHA LIPOIC ACID PO Take 1 tablet by mouth daily. Daily     cholecalciferol (VITAMIN D) 1000 UNITS tablet Take 1,000 Units by mouth daily.     ciclopirox (LOPROX) 0.77 % cream Apply 1 g/mL topically 2 (two) times daily.     clotrimazole-betamethasone (LOTRISONE) cream APPLY TOPICALLY 2 (TWO) TIMES DAILY AS NEEDED. 30 g 0   fenofibrate micronized (LOFIBRA) 134 MG capsule TAKE 1 CAPSULE (134 MG TOTAL) BY MOUTH AT BEDTIME. FOR CHOLESTEROL. 90 capsule 0   fish oil-omega-3 fatty acids 1000 MG capsule Take 2 g by mouth 2 (two) times daily.     gabapentin (NEURONTIN) 100 MG capsule TAKE 1 CAPSULE BY MOUTH THREE TIMES A DAY 270 capsule 0   Ginger, Zingiber officinalis, (GINGER PO) Take 1 tablet by mouth daily.     levothyroxine (SYNTHROID) 50 MCG tablet TAKE 1 TABLET BY MOUTH EVERY MORNING WITH WATER ON AN EMPTY STOMACH. NO OTHER FOODS/MEDS FOR 30 MINS 90 tablet 0   metoprolol tartrate (LOPRESSOR) 25 MG tablet TAKE 1 TAB BY MOUTH TWICE A DAY AND EXTRA TABLET AS NEEDED FOR BREAKTHROUGH PALPITATIONS AS DIRECTED 180 tablet 3   Multiple Vitamins-Minerals (MULTIVITAMIN WITH MINERALS) tablet Take 1 tablet by mouth daily.     NON FORMULARY Take 1 capsule by mouth daily.  Tumeric      Daily     omeprazole (PRILOSEC OTC) 20 MG tablet Take 2 tablets (40 mg total) by mouth in the morning and at bedtime. 360 tablet 0   tretinoin (RETIN-A) 0.05 % cream Apply 1 application topically daily as needed (as directed per dermatologist).   3   calcium carbonate (OS-CAL) 600 MG TABS Take 1,200 mg by mouth 2 (two) times daily with a meal. (Patient not taking: Reported on 09/19/2022)     diclofenac sodium (VOLTAREN) 1 % GEL Apply 2-4 grams to affected area up to 4 times daily (Patient not taking: Reported on 10/07/2022) 4 Tube 2   No current facility-administered medications on file prior to visit.    BP 104/60   Pulse 71   Temp (!) 97.4 F (36.3 C) (Temporal)   Ht 4\' 8"  (1.422 m)   Wt 155 lb (70.3 kg)   SpO2 97%   BMI 34.75 kg/m  Objective:   Physical Exam HENT:     Right Ear: Tympanic membrane and ear canal normal.     Left Ear: Tympanic membrane and ear canal normal.     Nose: Nose normal.  Eyes:     Conjunctiva/sclera: Conjunctivae normal.     Pupils: Pupils are equal, round, and reactive to light.  Neck:     Thyroid: No thyromegaly.  Cardiovascular:     Rate and Rhythm: Normal rate and regular rhythm.     Heart sounds: No murmur heard. Pulmonary:     Effort: Pulmonary effort is normal.     Breath sounds: Normal breath sounds. No rales.  Abdominal:     General: Bowel sounds are normal.     Palpations: Abdomen is soft.     Tenderness: There is no abdominal tenderness.  Musculoskeletal:        General: Normal range of motion.     Cervical back: Neck supple.       Feet:  Feet:     Left foot:     Skin integrity: Skin integrity normal.     Comments: Decrease ROM due to pain to left 2nd toe and MTP joint. Moderate soft tissue swelling to 2nd left toe and dorsal foot Lymphadenopathy:     Cervical: No cervical adenopathy.  Skin:    General: Skin is warm and dry.     Findings: Bruising present. No rash.     Comments: Dark blue bruising to left dorsal 2nd  toe and left dorsal foot at MTP joint.   Neurological:     Mental Status: She is alert and oriented to person, place, and time.     Cranial Nerves: No cranial nerve deficit.     Deep Tendon Reflexes: Reflexes are normal and symmetric.           Assessment & Plan:  Encounter for annual general medical examination with abnormal findings in adult Assessment & Plan: Immunizations UTD. Bone density scan due in Fall 2024, discussed with patient today. Mammogram UTD Colonoscopy UTD.  Discussed the importance of a healthy diet and regular exercise in order for weight loss, and to reduce the risk of further co-morbidity.  Exam stable. Labs pending.  Follow up in 1 year for repeat physical.    Symptomatic anemia Assessment & Plan: Symptoms improved.  Following with hematology, reviewed office notes from June 2024. Repeat iron studies pending.   Hypothyroidism, unspecified type Assessment & Plan: She is taking levothyroxine correctly. Continue levothyroxine 50 mcg daily. Recent TSH level controlled.   Gastroesophageal reflux disease, unspecified whether esophagitis present Assessment & Plan: Following with GI. Reviewed upper endoscopy from April 2024.  Continue omeprazole 40 mg BID.   Primary osteoarthritis involving multiple joints Assessment & Plan: Controlled.  Continue gabapentin 300 mg HS.    Exertional dyspnea Assessment & Plan: Improved with blood transfusion. Following with hematology.    Fibromyalgia Assessment & Plan: Controlled.  Continue gabapentin 300 mg HS   Frequent headaches Assessment & Plan: Improved overall, but she continues to experience frequent headaches.   Trial of amitriptyline 10 mg HS.  She will update.   Orders: -     Amitriptyline HCl; Take 1 tablet (10 mg total) by mouth at bedtime. For headache prevention  Dispense: 90 tablet; Refill: 0  GAD (generalized anxiety disorder) Assessment & Plan: Controlled. No  concerns today.  Continue off Zoloft.    History of MRSA infection Assessment & Plan: No recurrence.  Continue Bactrim DS 800-160 mg BID indefinitely.   Orders: -     Sulfamethoxazole-Trimethoprim; TAKE 1 TABLET (160 MG OF TRIMETHOPRIM TOTAL) BY MOUTH 2 (TWO) TIMES DAILY  Dispense: 180 tablet; Refill: 3  Long term current use of antibiotics Assessment & Plan: For MRSA prevention. Continue Bactrim DS 800-160 mg BID.   Decreased renal function Assessment & Plan: Improved! Continue to monitor.    Dyslipidemia Assessment & Plan: Controlled.  Continue to monitor.   Palpitations Assessment & Plan: Controlled.  Continue metoprolol tartrate 25 mg BID and extra 25 mg PRN.   Acute foot pain, left Assessment & Plan: Mildly posttraumatic. Need to rule out fracture.  X-rays ordered and pending.  Orders: -     DG Foot Complete Left        Doreene Nest, NP

## 2022-10-22 NOTE — Assessment & Plan Note (Signed)
Controlled.   Continue gabapentin 300 mg HS.  

## 2022-10-22 NOTE — Assessment & Plan Note (Signed)
Improved with blood transfusion. Following with hematology.

## 2022-10-22 NOTE — Assessment & Plan Note (Signed)
Controlled.  Continue to monitor.  

## 2022-10-22 NOTE — Assessment & Plan Note (Signed)
Immunizations UTD. Bone density scan due in Fall 2024, discussed with patient today. Mammogram UTD Colonoscopy UTD.  Discussed the importance of a healthy diet and regular exercise in order for weight loss, and to reduce the risk of further co-morbidity.  Exam stable. Labs pending.  Follow up in 1 year for repeat physical.

## 2022-10-22 NOTE — Assessment & Plan Note (Signed)
Symptoms improved.  Following with hematology, reviewed office notes from June 2024. Repeat iron studies pending.

## 2022-10-22 NOTE — Patient Instructions (Signed)
Complete xray(s) prior to leaving today. I will notify you of your results once received.  Start amitriptyline 10 mg every evening at bedtime for headache prevention.   It was a pleasure to see you today!

## 2022-10-22 NOTE — Assessment & Plan Note (Signed)
Mildly posttraumatic. Need to rule out fracture.  X-rays ordered and pending.

## 2022-10-22 NOTE — Assessment & Plan Note (Signed)
Improved. Continue to monitor. 

## 2022-10-22 NOTE — Assessment & Plan Note (Signed)
No recurrence.  Continue Bactrim DS 800-160 mg BID indefinitely.

## 2022-10-22 NOTE — Assessment & Plan Note (Signed)
Controlled.  Continue metoprolol tartrate 25 mg BID and extra 25 mg PRN.

## 2022-10-22 NOTE — Assessment & Plan Note (Signed)
Following with GI. Reviewed upper endoscopy from April 2024.  Continue omeprazole 40 mg BID.

## 2022-10-22 NOTE — Assessment & Plan Note (Signed)
For MRSA prevention. Continue Bactrim DS 800-160 mg BID.

## 2022-10-22 NOTE — Assessment & Plan Note (Signed)
Improved overall, but she continues to experience frequent headaches.   Trial of amitriptyline 10 mg HS.  She will update.

## 2022-10-22 NOTE — Assessment & Plan Note (Addendum)
She is taking levothyroxine correctly. Continue levothyroxine 50 mcg daily. Recent TSH level controlled.

## 2022-10-22 NOTE — Assessment & Plan Note (Signed)
Controlled. No concerns today.  Continue off Zoloft.

## 2022-10-24 ENCOUNTER — Telehealth: Payer: Self-pay

## 2022-10-24 NOTE — Telephone Encounter (Signed)
Results reviewed and sent for scanning

## 2022-10-24 NOTE — Telephone Encounter (Signed)
Left detailed msg on VM per HIPAA  Letting pt know recent capsule study results per Dr Tobi Bastos...   Pt has upcoming OV to f/u and pt advised to keep so that she can have repeat labs

## 2022-10-28 ENCOUNTER — Inpatient Hospital Stay: Payer: Medicare Other | Attending: Internal Medicine

## 2022-10-28 ENCOUNTER — Inpatient Hospital Stay: Payer: Medicare Other | Admitting: Internal Medicine

## 2022-10-28 ENCOUNTER — Inpatient Hospital Stay: Payer: Medicare Other

## 2022-10-28 ENCOUNTER — Encounter: Payer: Self-pay | Admitting: Internal Medicine

## 2022-10-28 VITALS — BP 114/51 | HR 72 | Temp 97.6°F | Resp 19 | Wt 155.5 lb

## 2022-10-28 VITALS — BP 138/67 | HR 68 | Resp 16

## 2022-10-28 DIAGNOSIS — Z85820 Personal history of malignant melanoma of skin: Secondary | ICD-10-CM | POA: Insufficient documentation

## 2022-10-28 DIAGNOSIS — E785 Hyperlipidemia, unspecified: Secondary | ICD-10-CM | POA: Diagnosis not present

## 2022-10-28 DIAGNOSIS — Z79899 Other long term (current) drug therapy: Secondary | ICD-10-CM | POA: Insufficient documentation

## 2022-10-28 DIAGNOSIS — K219 Gastro-esophageal reflux disease without esophagitis: Secondary | ICD-10-CM | POA: Insufficient documentation

## 2022-10-28 DIAGNOSIS — Z791 Long term (current) use of non-steroidal anti-inflammatories (NSAID): Secondary | ICD-10-CM | POA: Insufficient documentation

## 2022-10-28 DIAGNOSIS — M1990 Unspecified osteoarthritis, unspecified site: Secondary | ICD-10-CM | POA: Insufficient documentation

## 2022-10-28 DIAGNOSIS — Z8719 Personal history of other diseases of the digestive system: Secondary | ICD-10-CM | POA: Insufficient documentation

## 2022-10-28 DIAGNOSIS — E041 Nontoxic single thyroid nodule: Secondary | ICD-10-CM | POA: Diagnosis not present

## 2022-10-28 DIAGNOSIS — M797 Fibromyalgia: Secondary | ICD-10-CM | POA: Diagnosis not present

## 2022-10-28 DIAGNOSIS — D508 Other iron deficiency anemias: Secondary | ICD-10-CM | POA: Diagnosis not present

## 2022-10-28 DIAGNOSIS — K259 Gastric ulcer, unspecified as acute or chronic, without hemorrhage or perforation: Secondary | ICD-10-CM | POA: Diagnosis not present

## 2022-10-28 DIAGNOSIS — Z803 Family history of malignant neoplasm of breast: Secondary | ICD-10-CM | POA: Insufficient documentation

## 2022-10-28 DIAGNOSIS — Z79890 Hormone replacement therapy (postmenopausal): Secondary | ICD-10-CM | POA: Diagnosis not present

## 2022-10-28 DIAGNOSIS — D500 Iron deficiency anemia secondary to blood loss (chronic): Secondary | ICD-10-CM

## 2022-10-28 DIAGNOSIS — D509 Iron deficiency anemia, unspecified: Secondary | ICD-10-CM | POA: Diagnosis not present

## 2022-10-28 DIAGNOSIS — D649 Anemia, unspecified: Secondary | ICD-10-CM

## 2022-10-28 LAB — CBC WITH DIFFERENTIAL/PLATELET
Abs Immature Granulocytes: 0.01 K/uL (ref 0.00–0.07)
Basophils Absolute: 0 K/uL (ref 0.0–0.1)
Basophils Relative: 1 %
Eosinophils Absolute: 0.1 K/uL (ref 0.0–0.5)
Eosinophils Relative: 2 %
HCT: 34.5 % — ABNORMAL LOW (ref 36.0–46.0)
Hemoglobin: 10.9 g/dL — ABNORMAL LOW (ref 12.0–15.0)
Immature Granulocytes: 0 %
Lymphocytes Relative: 27 %
Lymphs Abs: 1.2 K/uL (ref 0.7–4.0)
MCH: 28.9 pg (ref 26.0–34.0)
MCHC: 31.6 g/dL (ref 30.0–36.0)
MCV: 91.5 fL (ref 80.0–100.0)
Monocytes Absolute: 0.4 K/uL (ref 0.1–1.0)
Monocytes Relative: 8 %
Neutro Abs: 2.9 K/uL (ref 1.7–7.7)
Neutrophils Relative %: 62 %
Platelets: 188 K/uL (ref 150–400)
RBC: 3.77 MIL/uL — ABNORMAL LOW (ref 3.87–5.11)
RDW: 22.5 % — ABNORMAL HIGH (ref 11.5–15.5)
WBC: 4.7 K/uL (ref 4.0–10.5)
nRBC: 0 % (ref 0.0–0.2)

## 2022-10-28 LAB — IRON AND TIBC
Iron: 68 ug/dL (ref 28–170)
Saturation Ratios: 15 % (ref 10.4–31.8)
TIBC: 469 ug/dL — ABNORMAL HIGH (ref 250–450)
UIBC: 401 ug/dL

## 2022-10-28 LAB — FERRITIN: Ferritin: 108 ng/mL (ref 11–307)

## 2022-10-28 MED ORDER — SODIUM CHLORIDE 0.9 % IV SOLN
510.0000 mg | Freq: Once | INTRAVENOUS | Status: AC
  Administered 2022-10-28: 510 mg via INTRAVENOUS
  Filled 2022-10-28: qty 510

## 2022-10-28 MED ORDER — SODIUM CHLORIDE 0.9 % IV SOLN
Freq: Once | INTRAVENOUS | Status: AC
  Filled 2022-10-28: qty 250

## 2022-10-28 NOTE — Progress Notes (Signed)
[facility]  Telephone:(336) [phone] Fax:(336) [phone]  ID: [name] [name] OB: 1935-08-24  MR#: [id number]  AOZ#:[id number]  Patient Care Team: Doreene Nest, NP as PCP - General (Internal Medicine) Antonieta Iba, MD as PCP - Cardiology (Cardiology)  HPI: [name] [name] is a 87 y.o. female with past medical history of hyperlipidemia, GERD, anxiety, fibromyalgia, osteoarthritis melanoma, MR was referred to hematology clinic for further management of iron deficiency anemia.   he has history of hysterectomy due to excessive bleeding in the past.  Denies any gastric bypass surgery.  She had right knee replacement in June 2023 when she required 1 unit of blood transfusion.  She was also started on oral iron which could not tolerate because of severe nausea.  She last had colonoscopy and endoscopy more than 15 years ago.  Colonoscopy from April 2024 by Dr. Tobi Bastos showed few polyps with pathology showing tubular adenoma.  No repeat advised due to age.  Upper endoscopy showed hematin in gastric antrum and 2 superficial nonbleeding gastric ulcer.  She was started on Prilosec 40 mg twice daily which she reports compliance to.    She received 2 doses of IV Ferraheme in December 2023.  Her hemoglobin was stable until June 2024 when it dropped to 6.9. s/p 1 unit PRBC. S/p capsule endoscopy on 10/25/2022 which showed minimal blood-tinged material seen at 50-minute 6-second amount but only a few seconds.  Was recommended close monitoring of iron level and hemoglobin and if it drops then would need balloon enteroscopy to rule out small underlying AVM.  Interval history Patient was seen today accompanied by daughter for labs and follow-up.  Recently, patient underwent capsule endoscopy procedure.  Denies any bleeding in urine or stool.  REVIEW OF SYSTEMS:   Review of Systems  Respiratory:  Positive for shortness of breath.     As per HPI. Otherwise, a complete review of systems is  negative.  PAST MEDICAL HISTORY: Past Medical History:  Diagnosis Date   Anxiety    prev on prozac   Chest pain    Felt to be noncardiac in the past   Chronic rectal fissure    Colon polyp    Dyslipidemia    Fibromyalgia    GERD (gastroesophageal reflux disease)    Barrett's esophagus   Hiatal hernia    Lumbar spine pain    Complex lumbar spine surgery at [facility], 2012, complicated, MRSA, much of the appliance is removed, patient on chronic antibiotics   Melanoma (HCC)    Mitral regurgitation    a. 2007 Echo Mild MR; b. 10/2016 Echo: EF 55-60%, no rwma, GrI DD, Mild MR.   Nausea and vomiting 08/22/2021   Osteoarthritis    Scoliosis    Sinusitis    Multiple sinus operations in the past   Statin intolerance    As of 2009, no further attempts to use statins   Thyroid nodule     PAST SURGICAL HISTORY: Past Surgical History:  Procedure Laterality Date   ABDOMINAL HYSTERECTOMY     BACK SURGERY     CHOLECYSTECTOMY     COLONOSCOPY WITH PROPOFOL N/A 08/07/2022   Procedure: COLONOSCOPY WITH PROPOFOL;  Surgeon: [name], MD;  Location: [facility] ENDOSCOPY;  Service: Gastroenterology;  Laterality: N/A;   ESOPHAGOGASTRODUODENOSCOPY (EGD) WITH PROPOFOL N/A 08/07/2022   Procedure: ESOPHAGOGASTRODUODENOSCOPY (EGD) WITH PROPOFOL;  Surgeon: [name], MD;  Location: [facility] ENDOSCOPY;  Service: Gastroenterology;  Laterality: N/A;   FOOT SURGERY     GIVENS CAPSULE  STUDY N/A 10/09/2022   Procedure: GIVENS CAPSULE STUDY;  Surgeon: [name], MD;  Location: [facility] ENDOSCOPY;  Service: Gastroenterology;  Laterality: N/A;   HERNIA REPAIR     KNEE SURGERY Right 08/08/2021   total knee replacement   NASAL SINUS SURGERY      FAMILY HISTORY: Family History  Problem Relation Age of Onset   GER disease Mother    Dementia Mother    Breast cancer Mother    Heart disease Father        MI at 51   Scoliosis Daughter    Colon cancer Neg Hx     HEALTH MAINTENANCE: Social History   Tobacco Use    Smoking status: Never    Passive exposure: Never   Smokeless tobacco: Never  Vaping Use   Vaping status: Never Used  Substance Use Topics   Alcohol use: No   Drug use: No     Allergies  Allergen Reactions   Other Other (See Comments)    Other Reaction: when taking for an extended period of time causes mouth ulcers and esophagus irritation Other Reaction: when taking for an extended period of time causes mouth ulcers and esophagus irritation    Fenofibric Acid Other (See Comments)    GI upset   Nsaids     REACTION: No long term use   Statins Other (See Comments)    Intolerant, myalgias Intolerant, myalgias Intolerant, myalgias Intolerant, myalgias Intolerant, myalgias Intolerant, myalgias    Trilipix [Choline Fenofibrate]     GI upset   Wasp Venom Swelling    Current Outpatient Medications  Medication Sig Dispense Refill   ALPHA LIPOIC ACID PO Take 1 tablet by mouth daily. Daily     amitriptyline (ELAVIL) 10 MG tablet Take 1 tablet (10 mg total) by mouth at bedtime. For headache prevention 90 tablet 0   cholecalciferol (VITAMIN D) 1000 UNITS tablet Take 1,000 Units by mouth daily.     ciclopirox (LOPROX) 0.77 % cream Apply 1 g/mL topically 2 (two) times daily.     clotrimazole-betamethasone (LOTRISONE) cream APPLY TOPICALLY 2 (TWO) TIMES DAILY AS NEEDED. 30 g 0   fenofibrate micronized (LOFIBRA) 134 MG capsule TAKE 1 CAPSULE (134 MG TOTAL) BY MOUTH AT BEDTIME. FOR CHOLESTEROL. 90 capsule 0   fish oil-omega-3 fatty acids 1000 MG capsule Take 2 g by mouth 2 (two) times daily.     gabapentin (NEURONTIN) 100 MG capsule TAKE 1 CAPSULE BY MOUTH THREE TIMES A DAY 270 capsule 0   Ginger, Zingiber officinalis, (GINGER PO) Take 1 tablet by mouth daily.     levothyroxine (SYNTHROID) 50 MCG tablet TAKE 1 TABLET BY MOUTH EVERY MORNING WITH WATER ON AN EMPTY STOMACH. NO OTHER FOODS/MEDS FOR 30 MINS 90 tablet 0   metoprolol tartrate (LOPRESSOR) 25 MG tablet TAKE 1 TAB BY MOUTH TWICE A  DAY AND EXTRA TABLET AS NEEDED FOR BREAKTHROUGH PALPITATIONS AS DIRECTED 180 tablet 3   Multiple Vitamins-Minerals (MULTIVITAMIN WITH MINERALS) tablet Take 1 tablet by mouth daily.     NON FORMULARY Take 1 capsule by mouth daily. Tumeric      Daily     omeprazole (PRILOSEC OTC) 20 MG tablet Take 2 tablets (40 mg total) by mouth in the morning and at bedtime. 360 tablet 0   sulfamethoxazole-trimethoprim (BACTRIM DS) 800-160 MG tablet TAKE 1 TABLET (160 MG OF TRIMETHOPRIM TOTAL) BY MOUTH 2 (TWO) TIMES DAILY 180 tablet 3   tretinoin (RETIN-A) 0.05 % cream Apply 1 application topically daily as  needed (as directed per dermatologist).   3   calcium carbonate (OS-CAL) 600 MG TABS Take 1,200 mg by mouth 2 (two) times daily with a meal. (Patient not taking: Reported on 09/19/2022)     diclofenac sodium (VOLTAREN) 1 % GEL Apply 2-4 grams to affected area up to 4 times daily (Patient not taking: Reported on 10/07/2022) 4 Tube 2   No current facility-administered medications for this visit.    OBJECTIVE: Vitals:   10/28/22 1457  BP: (!) 114/51  Pulse: 72  Resp: 19  Temp: 97.6 F (36.4 C)  SpO2: 97%     Body mass index is 34.86 kg/m.      General: Well-developed, well-nourished, no acute distress. Eyes: Pink conjunctiva, anicteric sclera. HEENT: Normocephalic, moist mucous membranes, clear oropharnyx. Lungs: Clear to auscultation bilaterally. Heart: Regular rate and rhythm. No rubs, murmurs, or gallops. Abdomen: Soft, nontender, nondistended. No organomegaly noted, normoactive bowel sounds. Musculoskeletal: No edema, cyanosis, or clubbing. Neuro: Alert, answering all questions appropriately. Cranial nerves grossly intact. Skin: No rashes or petechiae noted. Psych: Normal affect. Lymphatics: No cervical, calvicular, axillary or inguinal LAD.   LAB RESULTS:  Lab Results  Component Value Date   NA 140 10/15/2022   K 4.2 10/15/2022   CL 104 10/15/2022   CO2 27 10/15/2022   GLUCOSE 95  10/15/2022   BUN 19 10/15/2022   CREATININE 0.90 10/15/2022   CALCIUM 9.4 10/15/2022   PROT 6.2 10/15/2022   ALBUMIN 4.3 10/15/2022   AST 20 10/15/2022   ALT 14 10/15/2022   ALKPHOS 52 10/15/2022   BILITOT 0.4 10/15/2022    Lab Results  Component Value Date   WBC 4.7 10/28/2022   NEUTROABS 2.9 10/28/2022   HGB 10.9 (L) 10/28/2022   HCT 34.5 (L) 10/28/2022   MCV 91.5 10/28/2022   PLT 188 10/28/2022    Lab Results  Component Value Date   TIBC 469 (H) 10/28/2022   TIBC 626 (H) 09/19/2022   TIBC 536 (H) 06/10/2022   FERRITIN 108 10/28/2022   FERRITIN 7 (L) 09/19/2022   FERRITIN 35 06/10/2022   IRONPCTSAT 15 10/28/2022   IRONPCTSAT 3 (L) 09/19/2022   IRONPCTSAT 16 06/10/2022     STUDIES: DG Foot Complete Left  Result Date: 10/22/2022 CLINICAL DATA:  Acute second toe pain. Acute dorsal foot pain and swelling EXAM: LEFT FOOT - COMPLETE 3 VIEW COMPARISON:  None Available. FINDINGS: Degenerative spurring at the Lisfranc joint and at the interphalangeal and first MTP joints. Hammertoe deformities and hallux valgus. Prior osteotomy with pins at the first metatarsal. Lateral cortical thickening at the second proximal phalanx which is likely reactive, no underlying fracture is seen. Subjective osteopenia. Nonspecific soft tissue swelling especially at the dorsal forefoot. IMPRESSION: No acute finding. Interphalangeal and Lisfranc joint osteoarthritis. Electronically Signed   By: Tiburcio Pea M.D.   On: 10/22/2022 16:00    ASSESSMENT AND PLAN:   [name] [name] is a 87 y.o. female with pmh of hyperlipidemia, GERD, anxiety, fibromyalgia, osteoarthritis melanoma, MR was referred to hematology clinic for further management of iron deficiency anemia.  #Iron deficiency anemia # GI bleed -Could not tolerate oral iron due to nausea.  Completed IV Feraheme x 3 in December 2023.  Hemoglobin level has improved to 11.4. Colonoscopy from April 2024 by Dr. Tobi Bastos showed few polyps with pathology  showing tubular adenoma.  No repeat advised due to age.  Upper endoscopy showed hematin in gastric antrum and 2 superficial nonbleeding gastric ulcer.  She was started  on Prilosec 40 mg twice daily which she reports compliance to.    -Hemoglobin decreased to 6.9.  S/p 1 unit PRBC on 09/20/2022. Was restarted on IV Feraheme.  s/p capsule endoscopy on 10/25/2022 which showed minimal blood-tinged material seen at 50-minute 6-second amount but only a few seconds.  Was recommended close monitoring of iron level and hemoglobin and if it drops then would need balloon enteroscopy to rule out small underlying AVM.  Hemoglobin has improved to 10.9.  Iron level is pending.  I will proceed with IV Feraheme today.  I will closely monitor her with follow-up in 8 weeks.   # Vitamin B12 deficiency -B12 from September 2023 165. S/p IM cyanocobalamin 1000 mcg X 4 -On B12 supplements 1000 mcg.  B12 level improved to 1100.  Orders Placed This Encounter  Procedures   CBC with Differential/Platelet   Iron and TIBC   Ferritin   RTC in 2 month for MD visit, labs, possible Feraheme  Patient expressed understanding and was in agreement with this plan. She also understands that She can call clinic at any time with any questions, concerns, or complaints.   I spent a total of 25 minutes reviewing chart data, face-to-face evaluation with the patient, counseling and coordination of care as detailed above.  [name], MD   10/28/2022 4:06 PM

## 2022-11-03 ENCOUNTER — Other Ambulatory Visit: Payer: Self-pay | Admitting: Gastroenterology

## 2022-11-04 NOTE — Telephone Encounter (Signed)
Last EGD 08/07/2022 supposed to use Omperozole BID for 3 months  Last office visit 07/15/2022 IDA  Last refill 08/07/2022 0 refills

## 2022-11-05 ENCOUNTER — Ambulatory Visit: Payer: BC Managed Care – PPO | Admitting: Orthopaedic Surgery

## 2022-11-06 ENCOUNTER — Encounter: Payer: Self-pay | Admitting: Orthopaedic Surgery

## 2022-11-06 ENCOUNTER — Ambulatory Visit (INDEPENDENT_AMBULATORY_CARE_PROVIDER_SITE_OTHER): Payer: Medicare Other | Admitting: Orthopaedic Surgery

## 2022-11-06 DIAGNOSIS — M25562 Pain in left knee: Secondary | ICD-10-CM

## 2022-11-06 DIAGNOSIS — M1712 Unilateral primary osteoarthritis, left knee: Secondary | ICD-10-CM

## 2022-11-06 DIAGNOSIS — G8929 Other chronic pain: Secondary | ICD-10-CM | POA: Diagnosis not present

## 2022-11-06 MED ORDER — LIDOCAINE HCL 1 % IJ SOLN
3.0000 mL | INTRAMUSCULAR | Status: AC | PRN
Start: 2022-11-06 — End: 2022-11-06
  Administered 2022-11-06: 3 mL

## 2022-11-06 MED ORDER — METHYLPREDNISOLONE ACETATE 40 MG/ML IJ SUSP
40.0000 mg | INTRAMUSCULAR | Status: AC | PRN
Start: 2022-11-06 — End: 2022-11-06
  Administered 2022-11-06: 40 mg via INTRA_ARTICULAR

## 2022-11-06 NOTE — Progress Notes (Signed)
The patient is an 87 year old female well-known to me.  She comes in about every 3 months for steroid injection in her left knee to treat the pain from osteoarthritis.  She says the injections last several months.  She would like to have 1 again today.  She is still battling chronic anemia and the workup for anemia.  She has had to have several infusions as well.  This has been symptomatic for her.  She has had no acute changes with her knee.  Examination of her left knee shows no effusion.  There is slight varus malalignment and medial lateral joint line tenderness as well as patellofemoral crepitation and tenderness.  Per her request I did place a steroid injection in her left knee today without difficulty.  She would like [place] to do this again in 3 months.    Procedure Note  Patient: [name] [name]             Date of Birth: [birth year]           MRN: [id number]             Visit Date: 11/06/2022  Procedures: Visit Diagnoses:  1. Chronic pain of left knee   2. Unilateral primary osteoarthritis, left knee     Large Joint Inj: L knee on 11/06/2022 10:27 AM Indications: diagnostic evaluation and pain Details: 22 G 1.5 in needle, superolateral approach  Arthrogram: No  Medications: 3 mL lidocaine 1 %; 40 mg methylPREDNISolone acetate 40 MG/ML Outcome: tolerated well, no immediate complications Procedure, treatment alternatives, risks and benefits explained, specific risks discussed. Consent was given by the patient. Immediately prior to procedure a time out was called to verify the correct patient, procedure, equipment, support staff and site/side marked as required. Patient was prepped and draped in the usual sterile fashion.

## 2022-11-11 ENCOUNTER — Ambulatory Visit: Payer: BC Managed Care – PPO | Admitting: Gastroenterology

## 2022-12-06 ENCOUNTER — Other Ambulatory Visit: Payer: Self-pay | Admitting: Orthopaedic Surgery

## 2022-12-06 ENCOUNTER — Other Ambulatory Visit: Payer: Self-pay | Admitting: Primary Care

## 2022-12-06 DIAGNOSIS — E785 Hyperlipidemia, unspecified: Secondary | ICD-10-CM

## 2022-12-06 DIAGNOSIS — E039 Hypothyroidism, unspecified: Secondary | ICD-10-CM

## 2022-12-26 ENCOUNTER — Inpatient Hospital Stay: Payer: Medicare Other

## 2022-12-26 ENCOUNTER — Inpatient Hospital Stay: Payer: Medicare Other | Attending: Internal Medicine

## 2022-12-26 ENCOUNTER — Inpatient Hospital Stay (HOSPITAL_BASED_OUTPATIENT_CLINIC_OR_DEPARTMENT_OTHER): Payer: Medicare Other | Admitting: Internal Medicine

## 2022-12-26 VITALS — BP 117/57 | HR 73 | Temp 98.0°F | Resp 18

## 2022-12-26 VITALS — BP 117/49 | HR 76 | Temp 98.5°F | Wt 155.0 lb

## 2022-12-26 DIAGNOSIS — D508 Other iron deficiency anemias: Secondary | ICD-10-CM | POA: Diagnosis not present

## 2022-12-26 DIAGNOSIS — E538 Deficiency of other specified B group vitamins: Secondary | ICD-10-CM | POA: Diagnosis not present

## 2022-12-26 DIAGNOSIS — Z79899 Other long term (current) drug therapy: Secondary | ICD-10-CM | POA: Diagnosis not present

## 2022-12-26 DIAGNOSIS — D509 Iron deficiency anemia, unspecified: Secondary | ICD-10-CM | POA: Diagnosis not present

## 2022-12-26 DIAGNOSIS — D649 Anemia, unspecified: Secondary | ICD-10-CM

## 2022-12-26 LAB — CBC WITH DIFFERENTIAL/PLATELET
Abs Immature Granulocytes: 0.02 10*3/uL (ref 0.00–0.07)
Basophils Absolute: 0 10*3/uL (ref 0.0–0.1)
Basophils Relative: 1 %
Eosinophils Absolute: 0.1 10*3/uL (ref 0.0–0.5)
Eosinophils Relative: 2 %
HCT: 31.6 % — ABNORMAL LOW (ref 36.0–46.0)
Hemoglobin: 9.8 g/dL — ABNORMAL LOW (ref 12.0–15.0)
Immature Granulocytes: 0 %
Lymphocytes Relative: 26 %
Lymphs Abs: 1.2 10*3/uL (ref 0.7–4.0)
MCH: 30.9 pg (ref 26.0–34.0)
MCHC: 31 g/dL (ref 30.0–36.0)
MCV: 99.7 fL (ref 80.0–100.0)
Monocytes Absolute: 0.4 10*3/uL (ref 0.1–1.0)
Monocytes Relative: 8 %
Neutro Abs: 2.9 10*3/uL (ref 1.7–7.7)
Neutrophils Relative %: 63 %
Platelets: 206 10*3/uL (ref 150–400)
RBC: 3.17 MIL/uL — ABNORMAL LOW (ref 3.87–5.11)
RDW: 14.4 % (ref 11.5–15.5)
WBC: 4.6 10*3/uL (ref 4.0–10.5)
nRBC: 0 % (ref 0.0–0.2)

## 2022-12-26 LAB — IRON AND TIBC
Iron: 70 ug/dL (ref 28–170)
Saturation Ratios: 14 % (ref 10.4–31.8)
TIBC: 487 ug/dL — ABNORMAL HIGH (ref 250–450)
UIBC: 417 ug/dL

## 2022-12-26 LAB — FERRITIN: Ferritin: 54 ng/mL (ref 11–307)

## 2022-12-26 MED ORDER — SODIUM CHLORIDE 0.9 % IV SOLN
510.0000 mg | Freq: Once | INTRAVENOUS | Status: AC
  Administered 2022-12-26: 510 mg via INTRAVENOUS
  Filled 2022-12-26: qty 17

## 2022-12-26 MED ORDER — SODIUM CHLORIDE 0.9 % IV SOLN
Freq: Once | INTRAVENOUS | Status: AC
  Filled 2022-12-26: qty 250

## 2022-12-26 NOTE — Progress Notes (Signed)
[facility]  Telephone:(336) [phone] Fax:(336) [phone]  ID: AMII AMBROSI OB: 08-16-1935  MR#: [id number]  EPP#:[id number]  Patient Care Team: Doreene Nest, NP as PCP - General (Internal Medicine) Antonieta Iba, MD as PCP - Cardiology (Cardiology)  HPI: CARROL HUNKE is a 87 y.o. female with past medical history of hyperlipidemia, GERD, anxiety, fibromyalgia, osteoarthritis melanoma, MR was referred to hematology clinic for further management of iron deficiency anemia.   he has history of hysterectomy due to excessive bleeding in the past.  Denies any gastric bypass surgery.  She had right knee replacement in June 2023 when she required 1 unit of blood transfusion.  She was also started on oral iron which could not tolerate because of severe nausea.  She last had colonoscopy and endoscopy more than 15 years ago.  Colonoscopy from April 2024 by Dr. Tobi Bastos showed few polyps with pathology showing tubular adenoma.  No repeat advised due to age.  Upper endoscopy showed hematin in gastric antrum and 2 superficial nonbleeding gastric ulcer.  She was started on Prilosec 40 mg twice daily which she reports compliance to.    She received 2 doses of IV Ferraheme in December 2023.  Her hemoglobin was stable until June 2024 when it dropped to 6.9. s/p 1 unit PRBC. S/p capsule endoscopy on 10/25/2022 which showed minimal blood-tinged material seen at 50-minute 6-second amount but only a few seconds.  Was recommended close monitoring of iron level and hemoglobin and if it drops then would need balloon enteroscopy to rule out small underlying AVM.  Interval history Patient was seen today accompanied by husband for labs and follow-up.  Reports improvement in shortness of breath.  But does get tired easily.  Denies any bleeding in urine or stools.  REVIEW OF SYSTEMS:   Review of Systems  Respiratory:  Positive for shortness of breath.     As per HPI. Otherwise, a complete review  of systems is negative.  PAST MEDICAL HISTORY: Past Medical History:  Diagnosis Date   Anxiety    prev on prozac   Chest pain    Felt to be noncardiac in the past   Chronic rectal fissure    Colon polyp    Dyslipidemia    Fibromyalgia    GERD (gastroesophageal reflux disease)    Barrett's esophagus   Hiatal hernia    Lumbar spine pain    Complex lumbar spine surgery at [facility], 2012, complicated, MRSA, much of the appliance is removed, patient on chronic antibiotics   Melanoma (HCC)    Mitral regurgitation    a. 2007 Echo Mild MR; b. 10/2016 Echo: EF 55-60%, no rwma, GrI DD, Mild MR.   Nausea and vomiting 08/22/2021   Osteoarthritis    Scoliosis    Sinusitis    Multiple sinus operations in the past   Statin intolerance    As of 2009, no further attempts to use statins   Thyroid nodule     PAST SURGICAL HISTORY: Past Surgical History:  Procedure Laterality Date   ABDOMINAL HYSTERECTOMY     BACK SURGERY     CHOLECYSTECTOMY     COLONOSCOPY WITH PROPOFOL N/A 08/07/2022   Procedure: COLONOSCOPY WITH PROPOFOL;  Surgeon: [name], MD;  Location: [facility] ENDOSCOPY;  Service: Gastroenterology;  Laterality: N/A;   ESOPHAGOGASTRODUODENOSCOPY (EGD) WITH PROPOFOL N/A 08/07/2022   Procedure: ESOPHAGOGASTRODUODENOSCOPY (EGD) WITH PROPOFOL;  Surgeon: [name], MD;  Location: [facility] ENDOSCOPY;  Service: Gastroenterology;  Laterality: N/A;   FOOT SURGERY  GIVENS CAPSULE STUDY N/A 10/09/2022   Procedure: GIVENS CAPSULE STUDY;  Surgeon: [name], MD;  Location: [facility] ENDOSCOPY;  Service: Gastroenterology;  Laterality: N/A;   HERNIA REPAIR     KNEE SURGERY Right 08/08/2021   total knee replacement   NASAL SINUS SURGERY      FAMILY HISTORY: Family History  Problem Relation Age of Onset   GER disease Mother    Dementia Mother    Breast cancer Mother    Heart disease Father        MI at 36   Scoliosis Daughter    Colon cancer Neg Hx     HEALTH MAINTENANCE: Social History    Tobacco Use   Smoking status: Never    Passive exposure: Never   Smokeless tobacco: Never  Vaping Use   Vaping status: Never Used  Substance Use Topics   Alcohol use: No   Drug use: No     Allergies  Allergen Reactions   Other Other (See Comments)    Other Reaction: when taking for an extended period of time causes mouth ulcers and esophagus irritation Other Reaction: when taking for an extended period of time causes mouth ulcers and esophagus irritation    Fenofibric Acid Other (See Comments)    GI upset   Nsaids     REACTION: No long term use   Statins Other (See Comments)    Intolerant, myalgias Intolerant, myalgias Intolerant, myalgias Intolerant, myalgias Intolerant, myalgias Intolerant, myalgias    Trilipix [Choline Fenofibrate]     GI upset   Wasp Venom Swelling    Current Outpatient Medications  Medication Sig Dispense Refill   ALPHA LIPOIC ACID PO Take 1 tablet by mouth daily. Daily     amitriptyline (ELAVIL) 10 MG tablet Take 1 tablet (10 mg total) by mouth at bedtime. For headache prevention 90 tablet 0   cholecalciferol (VITAMIN D) 1000 UNITS tablet Take 1,000 Units by mouth daily.     ciclopirox (LOPROX) 0.77 % cream Apply 1 g/mL topically 2 (two) times daily.     clotrimazole-betamethasone (LOTRISONE) cream APPLY TOPICALLY 2 (TWO) TIMES DAILY AS NEEDED. 30 g 0   diclofenac sodium (VOLTAREN) 1 % GEL Apply 2-4 grams to affected area up to 4 times daily 4 Tube 2   fenofibrate micronized (LOFIBRA) 134 MG capsule TAKE 1 CAPSULE (134 MG TOTAL) BY MOUTH AT BEDTIME FOR CHOLESTEROL. 90 capsule 2   fish oil-omega-3 fatty acids 1000 MG capsule Take 2 g by mouth 2 (two) times daily.     gabapentin (NEURONTIN) 100 MG capsule TAKE 1 CAPSULE BY MOUTH THREE TIMES A DAY 270 capsule 0   Ginger, Zingiber officinalis, (GINGER PO) Take 1 tablet by mouth daily.     levothyroxine (SYNTHROID) 50 MCG tablet TAKE 1 TABLET BY MOUTH EVERY MORNING WITH WATER ON AN EMPTY STOMACH.  NO OTHER FOODS/MEDS FOR 30 MINS 90 tablet 2   metoprolol tartrate (LOPRESSOR) 25 MG tablet TAKE 1 TAB BY MOUTH TWICE A DAY AND EXTRA TABLET AS NEEDED FOR BREAKTHROUGH PALPITATIONS AS DIRECTED 180 tablet 3   Multiple Vitamins-Minerals (MULTIVITAMIN WITH MINERALS) tablet Take 1 tablet by mouth daily.     NON FORMULARY Take 1 capsule by mouth daily. Tumeric      Daily     omeprazole (PRILOSEC) 20 MG capsule TAKE 2 CAPS (40 MG TOTAL) BY MOUTH IN THE MORNING AND AT BEDTIME. 360 capsule 0   sulfamethoxazole-trimethoprim (BACTRIM DS) 800-160 MG tablet TAKE 1 TABLET (160 MG OF TRIMETHOPRIM  TOTAL) BY MOUTH 2 (TWO) TIMES DAILY 180 tablet 3   tretinoin (RETIN-A) 0.05 % cream Apply 1 application topically daily as needed (as directed per dermatologist).   3   calcium carbonate (OS-CAL) 600 MG TABS Take 1,200 mg by mouth 2 (two) times daily with a meal. (Patient not taking: Reported on 12/26/2022)     No current facility-administered medications for this visit.   Facility-Administered Medications Ordered in Other Visits  Medication Dose Route Frequency Provider Last Rate Last Admin   ferumoxytol (FERAHEME) 510 mg in sodium chloride 0.9 % 100 mL IVPB  510 mg Intravenous Once [name], MD        OBJECTIVE: Vitals:   12/26/22 1328  BP: (!) 117/49  Pulse: 76  Temp: 98.5 F (36.9 C)  SpO2: 96%     Body mass index is 34.75 kg/m.      General: Well-developed, well-nourished, no acute distress. Eyes: Pink conjunctiva, anicteric sclera. HEENT: Normocephalic, moist mucous membranes, clear oropharnyx. Lungs: Clear to auscultation bilaterally. Heart: Regular rate and rhythm. No rubs, murmurs, or gallops. Abdomen: Soft, nontender, nondistended. No organomegaly noted, normoactive bowel sounds. Musculoskeletal: No edema, cyanosis, or clubbing. Neuro: Alert, answering all questions appropriately. Cranial nerves grossly intact. Skin: No rashes or petechiae noted. Psych: Normal affect. Lymphatics: No  cervical, calvicular, axillary or inguinal LAD.   LAB RESULTS:  Lab Results  Component Value Date   NA 140 10/15/2022   K 4.2 10/15/2022   CL 104 10/15/2022   CO2 27 10/15/2022   GLUCOSE 95 10/15/2022   BUN 19 10/15/2022   CREATININE 0.90 10/15/2022   CALCIUM 9.4 10/15/2022   PROT 6.2 10/15/2022   ALBUMIN 4.3 10/15/2022   AST 20 10/15/2022   ALT 14 10/15/2022   ALKPHOS 52 10/15/2022   BILITOT 0.4 10/15/2022    Lab Results  Component Value Date   WBC 4.6 12/26/2022   NEUTROABS 2.9 12/26/2022   HGB 9.8 (L) 12/26/2022   HCT 31.6 (L) 12/26/2022   MCV 99.7 12/26/2022   PLT 206 12/26/2022    Lab Results  Component Value Date   TIBC 469 (H) 10/28/2022   TIBC 626 (H) 09/19/2022   TIBC 536 (H) 06/10/2022   FERRITIN 108 10/28/2022   FERRITIN 7 (L) 09/19/2022   FERRITIN 35 06/10/2022   IRONPCTSAT 15 10/28/2022   IRONPCTSAT 3 (L) 09/19/2022   IRONPCTSAT 16 06/10/2022     STUDIES: No results found.  ASSESSMENT AND PLAN:   MIKIRA MCELMURRAY is a 87 y.o. female with pmh of hyperlipidemia, GERD, anxiety, fibromyalgia, osteoarthritis melanoma, MR was referred to hematology clinic for further management of iron deficiency anemia.  #Iron deficiency anemia -Of unclear source.  Could not tolerate oral iron due to nausea.  Completed IV Feraheme x 3 in December 2023.  Hemoglobin level has improved to 11.4. Colonoscopy from April 2024 by Dr. Tobi Bastos showed few polyps with pathology showing tubular adenoma.  No repeat advised due to age.  Upper endoscopy showed hematin in gastric antrum and 2 superficial nonbleeding gastric ulcer.  She was started on Prilosec 40 mg twice daily which she reports compliance to.    -Hemoglobin decreased to 6.9.  S/p 1 unit PRBC on 09/20/2022. Was restarted on IV Feraheme.  s/p capsule endoscopy on 10/25/2022 which showed minimal blood-tinged material seen at 50-minute 6-second amount but only a few seconds.  Was recommended close monitoring of iron level and  hemoglobin and if it drops then would need balloon enteroscopy to rule out  small underlying AVM.    -Hemoglobin today is slightly lower from 10.9-9.8.  Iron panel is pending.  I will proceed with Feraheme today and schedule for second dose next week.  I did discuss about advanced GI testing with [facility] or [facility] which she is hesitant to at this time.  Reports she has already been through multiple tests.  At this time, I plan to support her with iron infusions and recheck labs in 6 weeks and rediscuss about further GI workup.  Her UA was checked previously with no hemoglobin noted.  # Vitamin B12 deficiency -B12 from September 2023 165. S/p IM cyanocobalamin 1000 mcg X 4 -Continue with B12 1000 mcg oral daily  Orders Placed This Encounter  Procedures   CBC with Differential (Cancer Center Only)   Iron and TIBC   Ferritin   RTC in 6 weeks for MD visit, labs, possible Feraheme  Patient expressed understanding and was in agreement with this plan. She also understands that She can call clinic at any time with any questions, concerns, or complaints.   I spent a total of 25 minutes reviewing chart data, face-to-face evaluation with the patient, counseling and coordination of care as detailed above.  [name], MD   12/26/2022 2:16 PM

## 2022-12-26 NOTE — Progress Notes (Signed)
Patient wants to know if there are any other tests or options to help treat or cure the issue. Her shortness of breath has improved, which she thinks its due to the blood transfusion.

## 2023-01-02 ENCOUNTER — Inpatient Hospital Stay: Payer: Medicare Other

## 2023-01-02 VITALS — BP 120/56 | HR 80 | Temp 99.1°F | Resp 18

## 2023-01-02 DIAGNOSIS — D649 Anemia, unspecified: Secondary | ICD-10-CM

## 2023-01-02 DIAGNOSIS — Z79899 Other long term (current) drug therapy: Secondary | ICD-10-CM | POA: Diagnosis not present

## 2023-01-02 DIAGNOSIS — D509 Iron deficiency anemia, unspecified: Secondary | ICD-10-CM | POA: Diagnosis not present

## 2023-01-02 DIAGNOSIS — E538 Deficiency of other specified B group vitamins: Secondary | ICD-10-CM | POA: Diagnosis not present

## 2023-01-02 MED ORDER — CYANOCOBALAMIN 1000 MCG/ML IJ SOLN: 1000.0000 ug | INTRAMUSCULAR | Status: DC

## 2023-01-02 MED ORDER — SODIUM CHLORIDE 0.9 % IV SOLN
INTRAVENOUS | Status: DC
  Filled 2023-01-02: qty 250

## 2023-01-02 MED ORDER — SODIUM CHLORIDE 0.9 % IV SOLN
510.0000 mg | Freq: Once | INTRAVENOUS | Status: AC
  Administered 2023-01-02: 510 mg via INTRAVENOUS
  Filled 2023-01-02: qty 510

## 2023-01-02 NOTE — Patient Instructions (Signed)
[facility]  Discharge Instructions: Thank you for choosing [facility] to provide your oncology and hematology care.  If you have a lab appointment with the Cancer Center, please go directly to the Cancer Center and check in at the registration area.  Wear comfortable clothing and clothing appropriate for easy access to any Portacath or PICC line.   We strive to give you quality time with your provider. You may need to reschedule your appointment if you arrive late (15 or more minutes).  Arriving late affects you and other patients whose appointments are after yours.  Also, if you miss three or more appointments without notifying the office, you may be dismissed from the clinic at the provider's discretion.      For prescription refill requests, have your pharmacy contact our office and allow 72 hours for refills to be completed.    Today you received the following chemotherapy and/or immunotherapy agents Feraheme.      To help prevent nausea and vomiting after your treatment, we encourage you to take your nausea medication as directed.  BELOW ARE SYMPTOMS THAT SHOULD BE REPORTED IMMEDIATELY: *FEVER GREATER THAN 100.4 F (38 C) OR HIGHER *CHILLS OR SWEATING *NAUSEA AND VOMITING THAT IS NOT CONTROLLED WITH YOUR NAUSEA MEDICATION *UNUSUAL SHORTNESS OF BREATH *UNUSUAL BRUISING OR BLEEDING *URINARY PROBLEMS (pain or burning when urinating, or frequent urination) *BOWEL PROBLEMS (unusual diarrhea, constipation, pain near the anus) TENDERNESS IN MOUTH AND THROAT WITH OR WITHOUT PRESENCE OF ULCERS (sore throat, sores in mouth, or a toothache) UNUSUAL RASH, SWELLING OR PAIN  UNUSUAL VAGINAL DISCHARGE OR ITCHING   Items with * indicate a potential emergency and should be followed up as soon as possible or go to the Emergency Department if any problems should occur.  Please show the CHEMOTHERAPY ALERT CARD or IMMUNOTHERAPY ALERT CARD at check-in to  the Emergency Department and triage nurse.  Should you have questions after your visit or need to cancel or reschedule your appointment, please contact [facility]  [phone] and follow the prompts.  Office hours are 8:00 a.m. to 4:30 p.m. Monday - Friday. Please note that voicemails left after 4:00 p.m. may not be returned until the following business day.  We are closed weekends and major holidays. You have access to a nurse at all times for urgent questions. Please call the main number to the clinic [phone] and follow the prompts.  For any non-urgent questions, you may also contact your provider using MyChart. We now offer e-Visits for anyone 44 and older to request care online for non-urgent symptoms. For details visit [email or web address].   Also download the MyChart app! Go to the app store, search "MyChart", open the app, select , and log in with your MyChart username and password.

## 2023-01-09 DIAGNOSIS — K080 Exfoliation of teeth due to systemic causes: Secondary | ICD-10-CM | POA: Diagnosis not present

## 2023-01-18 ENCOUNTER — Other Ambulatory Visit: Payer: Self-pay | Admitting: Primary Care

## 2023-01-18 DIAGNOSIS — R519 Headache, unspecified: Secondary | ICD-10-CM

## 2023-02-05 ENCOUNTER — Ambulatory Visit: Payer: Medicare Other | Admitting: Orthopaedic Surgery

## 2023-02-07 ENCOUNTER — Inpatient Hospital Stay: Payer: Medicare Other | Attending: Internal Medicine

## 2023-02-07 ENCOUNTER — Inpatient Hospital Stay (HOSPITAL_BASED_OUTPATIENT_CLINIC_OR_DEPARTMENT_OTHER): Payer: Medicare Other | Admitting: Internal Medicine

## 2023-02-07 ENCOUNTER — Inpatient Hospital Stay: Payer: Medicare Other

## 2023-02-07 ENCOUNTER — Other Ambulatory Visit: Payer: Self-pay

## 2023-02-07 VITALS — BP 136/70 | HR 70 | Temp 97.8°F | Wt 155.0 lb

## 2023-02-07 DIAGNOSIS — R0602 Shortness of breath: Secondary | ICD-10-CM | POA: Insufficient documentation

## 2023-02-07 DIAGNOSIS — D509 Iron deficiency anemia, unspecified: Secondary | ICD-10-CM | POA: Diagnosis not present

## 2023-02-07 DIAGNOSIS — E538 Deficiency of other specified B group vitamins: Secondary | ICD-10-CM

## 2023-02-07 DIAGNOSIS — D649 Anemia, unspecified: Secondary | ICD-10-CM | POA: Diagnosis not present

## 2023-02-07 DIAGNOSIS — D508 Other iron deficiency anemias: Secondary | ICD-10-CM | POA: Diagnosis not present

## 2023-02-07 DIAGNOSIS — Z803 Family history of malignant neoplasm of breast: Secondary | ICD-10-CM | POA: Insufficient documentation

## 2023-02-07 LAB — COMPREHENSIVE METABOLIC PANEL
ALT: 20 U/L (ref 0–44)
AST: 27 U/L (ref 15–41)
Albumin: 4 g/dL (ref 3.5–5.0)
Alkaline Phosphatase: 52 U/L (ref 38–126)
Anion gap: 9 (ref 5–15)
BUN: 23 mg/dL (ref 8–23)
CO2: 27 mmol/L (ref 22–32)
Calcium: 8.9 mg/dL (ref 8.9–10.3)
Chloride: 103 mmol/L (ref 98–111)
Creatinine, Ser: 0.91 mg/dL (ref 0.44–1.00)
GFR, Estimated: >60 mL/min (ref >60)
Glucose, Bld: 106 mg/dL — ABNORMAL HIGH (ref 70–99)
Potassium: 4.1 mmol/L (ref 3.5–5.1)
Sodium: 139 mmol/L (ref 135–145)
Total Bilirubin: 0.3 mg/dL (ref 0.3–1.2)
Total Protein: 6 g/dL — ABNORMAL LOW (ref 6.5–8.1)

## 2023-02-07 LAB — IRON AND TIBC
Iron: 77 ug/dL (ref 28–170)
Saturation Ratios: 17 % (ref 10.4–31.8)
TIBC: 462 ug/dL — ABNORMAL HIGH (ref 250–450)
UIBC: 385 ug/dL

## 2023-02-07 LAB — CBC WITH DIFFERENTIAL (CANCER CENTER ONLY)
Abs Immature Granulocytes: 0.03 10*3/uL (ref 0.00–0.07)
Basophils Absolute: 0 10*3/uL (ref 0.0–0.1)
Basophils Relative: 1 %
Eosinophils Absolute: 0.1 10*3/uL (ref 0.0–0.5)
Eosinophils Relative: 2 %
HCT: 29.3 % — ABNORMAL LOW (ref 36.0–46.0)
Hemoglobin: 9.3 g/dL — ABNORMAL LOW (ref 12.0–15.0)
Immature Granulocytes: 1 %
Lymphocytes Relative: 28 %
Lymphs Abs: 1.1 10*3/uL (ref 0.7–4.0)
MCH: 32.7 pg (ref 26.0–34.0)
MCHC: 31.7 g/dL (ref 30.0–36.0)
MCV: 103.2 fL — ABNORMAL HIGH (ref 80.0–100.0)
Monocytes Absolute: 0.3 10*3/uL (ref 0.1–1.0)
Monocytes Relative: 8 %
Neutro Abs: 2.5 10*3/uL (ref 1.7–7.7)
Neutrophils Relative %: 60 %
Platelet Count: 212 10*3/uL (ref 150–400)
RBC: 2.84 MIL/uL — ABNORMAL LOW (ref 3.87–5.11)
RDW: 14.5 % (ref 11.5–15.5)
WBC Count: 4.1 10*3/uL (ref 4.0–10.5)
nRBC: 0 % (ref 0.0–0.2)

## 2023-02-07 LAB — FERRITIN: Ferritin: 236 ng/mL (ref 11–307)

## 2023-02-07 NOTE — Addendum Note (Signed)
Addended byMichaelyn Barter on: 02/07/2023 03:23 PM   Modules accepted: Orders

## 2023-02-07 NOTE — Progress Notes (Signed)
[facility]  [phone] [phone]  ID: [name] [name] OB: 03-31-1936  MR#: [id number]  VOJ#:[id number]  Patient Care Team: Doreene Nest, NP as PCP - General (Internal Medicine) Antonieta Iba, MD as PCP - Cardiology (Cardiology) [name], MD as Consulting Physician (Oncology)  HPI: [name] [name] is a 87 y.o. female with past medical history of hyperlipidemia, GERD, anxiety, fibromyalgia, osteoarthritis melanoma, MR was referred to hematology clinic for further management of iron deficiency anemia.   he has history of hysterectomy due to excessive bleeding in the past.  Denies any gastric bypass surgery.  She had right knee replacement in June 2023 when she required 1 unit of blood transfusion.  She was also started on oral iron which could not tolerate because of severe nausea.  She last had colonoscopy and endoscopy more than 15 years ago.  Colonoscopy from April 2024 by Dr. Tobi Bastos showed few polyps with pathology showing tubular adenoma.  No repeat advised due to age.  Upper endoscopy showed hematin in gastric antrum and 2 superficial nonbleeding gastric ulcer.  She was started on Prilosec 40 mg twice daily which she reports compliance to.    She received 2 doses of IV Ferraheme in December 2023.  Her hemoglobin was stable until June 2024 when it dropped to 6.9. s/p 1 unit PRBC. S/p capsule endoscopy on 10/25/2022 which showed minimal blood-tinged material seen at 50-minute 6-second amount but only a few seconds.  Was recommended close monitoring of iron level and hemoglobin and if it drops then would need balloon enteroscopy to rule out small underlying AVM.  Last Feraheme in September 2024.  Interval history Patient was seen today as follow-up accompanied with daughter for anemia and labs. Overall has been feeling good.  Continues to have shortness of breath but not as intense.  Has been taking beef iron supplements.  Reports dark stools  for past 1 week.  REVIEW OF SYSTEMS:   Review of Systems  Respiratory:  Positive for shortness of breath.     As per HPI. Otherwise, a complete review of systems is negative.  PAST MEDICAL HISTORY: Past Medical History:  Diagnosis Date   Anxiety    prev on prozac   Chest pain    Felt to be noncardiac in the past   Chronic rectal fissure    Colon polyp    Dyslipidemia    Fibromyalgia    GERD (gastroesophageal reflux disease)    Barrett's esophagus   Hiatal hernia    Lumbar spine pain    Complex lumbar spine surgery at [facility], 2012, complicated, MRSA, much of the appliance is removed, patient on chronic antibiotics   Melanoma (HCC)    Mitral regurgitation    a. 2007 Echo Mild MR; b. 10/2016 Echo: EF 55-60%, no rwma, GrI DD, Mild MR.   Nausea and vomiting 08/22/2021   Osteoarthritis    Scoliosis    Sinusitis    Multiple sinus operations in the past   Statin intolerance    As of 2009, no further attempts to use statins   Thyroid nodule     PAST SURGICAL HISTORY: Past Surgical History:  Procedure Laterality Date   ABDOMINAL HYSTERECTOMY     BACK SURGERY     CHOLECYSTECTOMY     COLONOSCOPY WITH PROPOFOL N/A 08/07/2022   Procedure: COLONOSCOPY WITH PROPOFOL;  Surgeon: [name], MD;  Location: [facility] ENDOSCOPY;  Service: Gastroenterology;  Laterality: N/A;   ESOPHAGOGASTRODUODENOSCOPY (EGD) WITH PROPOFOL N/A 08/07/2022  Procedure: ESOPHAGOGASTRODUODENOSCOPY (EGD) WITH PROPOFOL;  Surgeon: [name], MD;  Location: [facility] ENDOSCOPY;  Service: Gastroenterology;  Laterality: N/A;   FOOT SURGERY     GIVENS CAPSULE STUDY N/A 10/09/2022   Procedure: GIVENS CAPSULE STUDY;  Surgeon: [name], MD;  Location: [facility] ENDOSCOPY;  Service: Gastroenterology;  Laterality: N/A;   HERNIA REPAIR     KNEE SURGERY Right 08/08/2021   total knee replacement   NASAL SINUS SURGERY      FAMILY HISTORY: Family History  Problem Relation Age of Onset   GER disease Mother    Dementia Mother     Breast cancer Mother    Heart disease Father        MI at 35   Scoliosis Daughter    Colon cancer Neg Hx     HEALTH MAINTENANCE: Social History   Tobacco Use   Smoking status: Never    Passive exposure: Never   Smokeless tobacco: Never  Vaping Use   Vaping status: Never Used  Substance Use Topics   Alcohol use: No   Drug use: No     Allergies  Allergen Reactions   Other Other (See Comments)    Other Reaction: when taking for an extended period of time causes mouth ulcers and esophagus irritation Other Reaction: when taking for an extended period of time causes mouth ulcers and esophagus irritation    Fenofibric Acid Other (See Comments)    GI upset   Nsaids     REACTION: No long term use   Statins Other (See Comments)    Intolerant, myalgias Intolerant, myalgias Intolerant, myalgias Intolerant, myalgias Intolerant, myalgias Intolerant, myalgias    Trilipix [Choline Fenofibrate]     GI upset   Wasp Venom Swelling    Current Outpatient Medications  Medication Sig Dispense Refill   ALPHA LIPOIC ACID PO Take 1 tablet by mouth daily. Daily     amitriptyline (ELAVIL) 10 MG tablet TAKE 1 TABLET (10 MG TOTAL) BY MOUTH AT BEDTIME FOR HEADACHE PREVENTION 90 tablet 2   calcium carbonate (OS-CAL) 600 MG TABS Take 1,200 mg by mouth 2 (two) times daily with a meal. (Patient not taking: Reported on 12/26/2022)     cholecalciferol (VITAMIN D) 1000 UNITS tablet Take 1,000 Units by mouth daily.     ciclopirox (LOPROX) 0.77 % cream Apply 1 g/mL topically 2 (two) times daily.     clotrimazole-betamethasone (LOTRISONE) cream APPLY TOPICALLY 2 (TWO) TIMES DAILY AS NEEDED. 30 g 0   diclofenac sodium (VOLTAREN) 1 % GEL Apply 2-4 grams to affected area up to 4 times daily 4 Tube 2   fenofibrate micronized (LOFIBRA) 134 MG capsule TAKE 1 CAPSULE (134 MG TOTAL) BY MOUTH AT BEDTIME FOR CHOLESTEROL. 90 capsule 2   fish oil-omega-3 fatty acids 1000 MG capsule Take 2 g by mouth 2 (two) times  daily.     gabapentin (NEURONTIN) 100 MG capsule TAKE 1 CAPSULE BY MOUTH THREE TIMES A DAY 270 capsule 0   Ginger, Zingiber officinalis, (GINGER PO) Take 1 tablet by mouth daily.     levothyroxine (SYNTHROID) 50 MCG tablet TAKE 1 TABLET BY MOUTH EVERY MORNING WITH WATER ON AN EMPTY STOMACH. NO OTHER FOODS/MEDS FOR 30 MINS 90 tablet 2   metoprolol tartrate (LOPRESSOR) 25 MG tablet TAKE 1 TAB BY MOUTH TWICE A DAY AND EXTRA TABLET AS NEEDED FOR BREAKTHROUGH PALPITATIONS AS DIRECTED 180 tablet 3   Multiple Vitamins-Minerals (MULTIVITAMIN WITH MINERALS) tablet Take 1 tablet by mouth daily.  NON FORMULARY Take 1 capsule by mouth daily. Tumeric      Daily     omeprazole (PRILOSEC) 20 MG capsule TAKE 2 CAPS (40 MG TOTAL) BY MOUTH IN THE MORNING AND AT BEDTIME. 360 capsule 0   sulfamethoxazole-trimethoprim (BACTRIM DS) 800-160 MG tablet TAKE 1 TABLET (160 MG OF TRIMETHOPRIM TOTAL) BY MOUTH 2 (TWO) TIMES DAILY 180 tablet 3   tretinoin (RETIN-A) 0.05 % cream Apply 1 application topically daily as needed (as directed per dermatologist).   3   No current facility-administered medications for this visit.    OBJECTIVE: Vitals:   02/07/23 1341  BP: 136/70  Pulse: 70  Temp: 97.8 F (36.6 C)  SpO2: 95%     Body mass index is 34.75 kg/m.      General: Well-developed, well-nourished, no acute distress. Eyes: Pink conjunctiva, anicteric sclera. HEENT: Normocephalic, moist mucous membranes, clear oropharnyx. Lungs: Clear to auscultation bilaterally. Heart: Regular rate and rhythm. No rubs, murmurs, or gallops. Abdomen: Soft, nontender, nondistended. No organomegaly noted, normoactive bowel sounds. Musculoskeletal: No edema, cyanosis, or clubbing. Neuro: Alert, answering all questions appropriately. Cranial nerves grossly intact. Skin: No rashes or petechiae noted. Psych: Normal affect. Lymphatics: No cervical, calvicular, axillary or inguinal LAD.   LAB RESULTS:  Lab Results  Component Value  Date   NA 140 10/15/2022   K 4.2 10/15/2022   CL 104 10/15/2022   CO2 27 10/15/2022   GLUCOSE 95 10/15/2022   BUN 19 10/15/2022   CREATININE 0.90 10/15/2022   CALCIUM 9.4 10/15/2022   PROT 6.2 10/15/2022   ALBUMIN 4.3 10/15/2022   AST 20 10/15/2022   ALT 14 10/15/2022   ALKPHOS 52 10/15/2022   BILITOT 0.4 10/15/2022    Lab Results  Component Value Date   WBC 4.1 02/07/2023   NEUTROABS 2.5 02/07/2023   HGB 9.3 (L) 02/07/2023   HCT 29.3 (L) 02/07/2023   MCV 103.2 (H) 02/07/2023   PLT 212 02/07/2023    Lab Results  Component Value Date   TIBC 462 (H) 02/07/2023   TIBC 487 (H) 12/26/2022   TIBC 469 (H) 10/28/2022   FERRITIN 236 02/07/2023   FERRITIN 54 12/26/2022   FERRITIN 108 10/28/2022   IRONPCTSAT 17 02/07/2023   IRONPCTSAT 14 12/26/2022   IRONPCTSAT 15 10/28/2022     STUDIES: No results found.  ASSESSMENT AND PLAN:   [name] [name] is a 87 y.o. female with pmh of hyperlipidemia, GERD, anxiety, fibromyalgia, osteoarthritis melanoma, MR was referred to hematology clinic for further management of iron deficiency anemia.  #Iron deficiency anemia -Of unclear source.  Could not tolerate oral iron due to nausea.  Completed IV Feraheme x 3 in December 2023.  Hemoglobin level has improved to 11.4. Colonoscopy from April 2024 by Dr. Tobi Bastos showed few polyps with pathology showing tubular adenoma.  No repeat advised due to age.  Upper endoscopy showed hematin in gastric antrum and 2 superficial nonbleeding gastric ulcer.  She was started on Prilosec 40 mg twice daily which she reports compliance to.    -Hemoglobin decreased to 6.9 and low ferritin.  S/p 1 unit PRBC on 09/20/2022. Was restarted on IV Feraheme.  s/p capsule endoscopy on 10/25/2022 which showed minimal blood-tinged material seen at 50-minute 6-second amount but only a few seconds.  Was recommended close monitoring of iron level and hemoglobin and if it drops then would need balloon enteroscopy to rule out small  underlying AVM.    -Last [name] Dull was in September 2024.  Hemoglobin improved  to 10.9.  Now maintained ~9. Ferritin is 236.  MCV elevated today to 103.6. Ca normal.WBC and platelets normal. Discussed with the patient and the daughter that her iron levels have been corrected but she continues to have anemia so there may be other etiologies involved such as chronic kidney disease or bone marrow disorder such as MDS.  eGFR has been in 50s.  We discussed about EPO injections if hemoglobin goes below 9.  Side effects such as hypertension, increased risk of thromboembolic event and cardiovascular event with hemoglobin more than 11 was discussed.  Also discussed about the possibility of bone marrow biopsy. Plan is to closely monitor labs in 2 months and proceed with BMBx if anemia worsened. I will also check B12, folate, myeloma panel and EPO level.  # Vitamin B12 deficiency -B12 from September 2023 165. S/p IM cyanocobalamin 1000 mcg X 4 -Continue with B12 1000 mcg oral daily  Orders Placed This Encounter  Procedures   CBC with Differential/Platelet   Ferritin   Iron and TIBC([facility])   Vitamin B12   Folate   Comprehensive metabolic panel   Multiple Myeloma Panel (SPEP&IFE w/QIG)   Kappa/lambda light chains   Erythropoietin   RTC in about 2 months for MD visit, labs.  Patient expressed understanding and was in agreement with this plan. She also understands that She can call clinic at any time with any questions, concerns, or complaints.   I spent a total of 25 minutes reviewing chart data, face-to-face evaluation with the patient, counseling and coordination of care as detailed above.  [name], MD   02/07/2023 3:10 PM

## 2023-02-07 NOTE — Progress Notes (Signed)
Patient has a couple of questions for Dr. Alena Bills.

## 2023-02-26 ENCOUNTER — Other Ambulatory Visit: Payer: Medicare Other

## 2023-02-26 ENCOUNTER — Encounter: Payer: Self-pay | Admitting: Orthopaedic Surgery

## 2023-02-26 ENCOUNTER — Ambulatory Visit (INDEPENDENT_AMBULATORY_CARE_PROVIDER_SITE_OTHER): Payer: Medicare Other | Admitting: Orthopaedic Surgery

## 2023-02-26 DIAGNOSIS — M25562 Pain in left knee: Secondary | ICD-10-CM | POA: Diagnosis not present

## 2023-02-26 DIAGNOSIS — M1712 Unilateral primary osteoarthritis, left knee: Secondary | ICD-10-CM

## 2023-02-26 DIAGNOSIS — G8929 Other chronic pain: Secondary | ICD-10-CM | POA: Diagnosis not present

## 2023-02-26 MED ORDER — METHYLPREDNISOLONE ACETATE 40 MG/ML IJ SUSP
40.0000 mg | INTRAMUSCULAR | Status: AC | PRN
  Administered 2023-02-26: 40 mg via INTRA_ARTICULAR

## 2023-02-26 MED ORDER — LIDOCAINE HCL 1 % IJ SOLN
3.0000 mL | INTRAMUSCULAR | Status: AC | PRN
  Administered 2023-02-26: 3 mL

## 2023-02-26 NOTE — Progress Notes (Signed)
The patient is an 87 year old female who comes in every 3 months for steroid injection in her left knee to treat the pain from osteoarthritis.  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  She walks significant leaned over.  She is not interested in any type of surgical intervention for her left knee and I agree with this as well.  She said the injections last for about 6 to 8 weeks.  She has had no acute change recently in her medical status.  Examination of her left knee shows no effusion.  There is medial and lateral joint line tenderness and patellofemoral crepitation.  The knee has varus malalignment that is correctable but pain throughout the arc of motion of the left knee.  I did place a steroid injection per her request in her left knee today which she tolerated well.  She knows to wait at least 3 months between steroid injections.    Procedure Note  Patient: [name] [name]             Date of Birth: [birth year]           MRN: [id number]             Visit Date: 02/26/2023  Procedures: Visit Diagnoses:  1. Chronic pain of left knee   2. Unilateral primary osteoarthritis, left knee     Large Joint Inj: L knee on 02/26/2023 4:16 PM Indications: diagnostic evaluation and pain Details: 22 G 1.5 in needle, superolateral approach  Arthrogram: No  Medications: 3 mL lidocaine 1 %; 40 mg methylPREDNISolone acetate 40 MG/ML Outcome: tolerated well, no immediate complications Procedure, treatment alternatives, risks and benefits explained, specific risks discussed. Consent was given by the patient. Immediately prior to procedure a time out was called to verify the correct patient, procedure, equipment, support staff and site/side marked as required. Patient was prepped and draped in the usual sterile fashion.

## 2023-03-05 DIAGNOSIS — H52223 Regular astigmatism, bilateral: Secondary | ICD-10-CM | POA: Diagnosis not present

## 2023-03-05 DIAGNOSIS — H5203 Hypermetropia, bilateral: Secondary | ICD-10-CM | POA: Diagnosis not present

## 2023-03-05 DIAGNOSIS — Z961 Presence of intraocular lens: Secondary | ICD-10-CM | POA: Diagnosis not present

## 2023-03-05 DIAGNOSIS — H35371 Puckering of macula, right eye: Secondary | ICD-10-CM | POA: Diagnosis not present

## 2023-03-05 DIAGNOSIS — H35363 Drusen (degenerative) of macula, bilateral: Secondary | ICD-10-CM | POA: Diagnosis not present

## 2023-04-02 ENCOUNTER — Other Ambulatory Visit: Payer: Self-pay | Admitting: Orthopaedic Surgery

## 2023-04-14 ENCOUNTER — Encounter: Payer: Self-pay | Admitting: Internal Medicine

## 2023-04-21 ENCOUNTER — Encounter: Payer: Self-pay | Admitting: Internal Medicine

## 2023-04-21 ENCOUNTER — Inpatient Hospital Stay: Payer: Medicare Other | Attending: Internal Medicine

## 2023-04-21 ENCOUNTER — Inpatient Hospital Stay (HOSPITAL_BASED_OUTPATIENT_CLINIC_OR_DEPARTMENT_OTHER): Payer: Medicare Other | Admitting: Internal Medicine

## 2023-04-21 VITALS — BP 126/59 | HR 90 | Temp 98.4°F | Resp 14 | Wt 157.0 lb

## 2023-04-21 DIAGNOSIS — D508 Other iron deficiency anemias: Secondary | ICD-10-CM

## 2023-04-21 DIAGNOSIS — Z8719 Personal history of other diseases of the digestive system: Secondary | ICD-10-CM | POA: Diagnosis not present

## 2023-04-21 DIAGNOSIS — Z803 Family history of malignant neoplasm of breast: Secondary | ICD-10-CM | POA: Diagnosis not present

## 2023-04-21 DIAGNOSIS — D509 Iron deficiency anemia, unspecified: Secondary | ICD-10-CM | POA: Insufficient documentation

## 2023-04-21 DIAGNOSIS — Z79899 Other long term (current) drug therapy: Secondary | ICD-10-CM | POA: Insufficient documentation

## 2023-04-21 DIAGNOSIS — E538 Deficiency of other specified B group vitamins: Secondary | ICD-10-CM

## 2023-04-21 DIAGNOSIS — Z85820 Personal history of malignant melanoma of skin: Secondary | ICD-10-CM | POA: Insufficient documentation

## 2023-04-21 DIAGNOSIS — K259 Gastric ulcer, unspecified as acute or chronic, without hemorrhage or perforation: Secondary | ICD-10-CM | POA: Diagnosis not present

## 2023-04-21 DIAGNOSIS — Z90710 Acquired absence of both cervix and uterus: Secondary | ICD-10-CM | POA: Insufficient documentation

## 2023-04-21 DIAGNOSIS — E785 Hyperlipidemia, unspecified: Secondary | ICD-10-CM | POA: Diagnosis not present

## 2023-04-21 DIAGNOSIS — M1990 Unspecified osteoarthritis, unspecified site: Secondary | ICD-10-CM | POA: Insufficient documentation

## 2023-04-21 DIAGNOSIS — M419 Scoliosis, unspecified: Secondary | ICD-10-CM | POA: Insufficient documentation

## 2023-04-21 DIAGNOSIS — Z79890 Hormone replacement therapy (postmenopausal): Secondary | ICD-10-CM | POA: Diagnosis not present

## 2023-04-21 DIAGNOSIS — R5383 Other fatigue: Secondary | ICD-10-CM | POA: Diagnosis not present

## 2023-04-21 DIAGNOSIS — R0609 Other forms of dyspnea: Secondary | ICD-10-CM | POA: Diagnosis not present

## 2023-04-21 DIAGNOSIS — Z791 Long term (current) use of non-steroidal anti-inflammatories (NSAID): Secondary | ICD-10-CM | POA: Diagnosis not present

## 2023-04-21 DIAGNOSIS — R0602 Shortness of breath: Secondary | ICD-10-CM | POA: Diagnosis not present

## 2023-04-21 DIAGNOSIS — Z860100 Personal history of colon polyps, unspecified: Secondary | ICD-10-CM | POA: Insufficient documentation

## 2023-04-21 DIAGNOSIS — D649 Anemia, unspecified: Secondary | ICD-10-CM

## 2023-04-21 DIAGNOSIS — K219 Gastro-esophageal reflux disease without esophagitis: Secondary | ICD-10-CM | POA: Diagnosis not present

## 2023-04-21 DIAGNOSIS — M797 Fibromyalgia: Secondary | ICD-10-CM | POA: Diagnosis not present

## 2023-04-21 LAB — CBC WITH DIFFERENTIAL/PLATELET
Abs Immature Granulocytes: 0.01 10*3/uL (ref 0.00–0.07)
Basophils Absolute: 0 10*3/uL (ref 0.0–0.1)
Basophils Relative: 1 %
Eosinophils Absolute: 0.1 10*3/uL (ref 0.0–0.5)
Eosinophils Relative: 2 %
HCT: 33.6 % — ABNORMAL LOW (ref 36.0–46.0)
Hemoglobin: 10.6 g/dL — ABNORMAL LOW (ref 12.0–15.0)
Immature Granulocytes: 0 %
Lymphocytes Relative: 25 %
Lymphs Abs: 1.1 10*3/uL (ref 0.7–4.0)
MCH: 31.5 pg (ref 26.0–34.0)
MCHC: 31.5 g/dL (ref 30.0–36.0)
MCV: 99.7 fL (ref 80.0–100.0)
Monocytes Absolute: 0.5 10*3/uL (ref 0.1–1.0)
Monocytes Relative: 10 %
Neutro Abs: 2.7 10*3/uL (ref 1.7–7.7)
Neutrophils Relative %: 62 %
Platelets: 190 10*3/uL (ref 150–400)
RBC: 3.37 MIL/uL — ABNORMAL LOW (ref 3.87–5.11)
RDW: 13.7 % (ref 11.5–15.5)
WBC: 4.3 10*3/uL (ref 4.0–10.5)
nRBC: 0 % (ref 0.0–0.2)

## 2023-04-21 LAB — IRON AND TIBC
Iron: 57 ug/dL (ref 28–170)
Saturation Ratios: 11 % (ref 10.4–31.8)
TIBC: 503 ug/dL — ABNORMAL HIGH (ref 250–450)
UIBC: 446 ug/dL

## 2023-04-21 LAB — FERRITIN: Ferritin: 66 ng/mL (ref 11–307)

## 2023-04-21 NOTE — Progress Notes (Signed)
 Patient would like to discuss maybe going to [facility] for further testing, and maybe having a bone density.

## 2023-04-21 NOTE — Progress Notes (Signed)
 [facility]  Telephone:([phone]) [phone] Fax:(336) [phone]  ID: [name] [name] OB: 11-30-1935  MR#: [id number]  RDW#:[id number]  Patient Care Team: Gretta Comer POUR, NP as PCP - General (Internal Medicine) Perla Evalene PARAS, [name] [name] as PCP - Cardiology (Cardiology) Clista Bimler, [name] [name] as Consulting Physician (Oncology)  HPI: [name] [name] is a 88 y.o. female with past medical history of hyperlipidemia, GERD, anxiety, fibromyalgia, osteoarthritis melanoma, MR was referred to hematology clinic for further management of iron deficiency anemia.   he has history of hysterectomy due to excessive bleeding in the past.  Denies any gastric bypass surgery.  She had right knee replacement in June 2023 when she required 1 unit of blood transfusion.  She was also started on oral iron which could not tolerate because of severe nausea.  She last had colonoscopy and endoscopy more than 15 years ago.  Colonoscopy from April 2024 by Dr. Therisa showed few polyps with pathology showing tubular adenoma.  No repeat advised due to age.  Upper endoscopy showed hematin in gastric antrum and 2 superficial nonbleeding gastric ulcer.  She was started on Prilosec  40 mg twice daily which she reports compliance to.    She received 2 doses of IV Ferraheme in December 2023.  Her hemoglobin was stable until June 2024 when it dropped to 6.9. s/p 1 unit PRBC. S/p capsule endoscopy on 10/25/2022 which showed minimal blood-tinged material seen at 50-minute 6-second amount but only a few seconds.  Was recommended close monitoring of iron level and hemoglobin and if it drops then would need balloon enteroscopy to rule out small underlying AVM.  Last Feraheme  in September 2024.  Interval history Patient seen today as follow-up accompanied with her husband for follow-up of anemia and labs. She has been feeling fatigued.  Reports shortness of breath at rest and on exertion.  REVIEW OF SYSTEMS:   Review of Systems   Respiratory:  Positive for shortness of breath.     As per HPI. Otherwise, a complete review of systems is negative.  PAST MEDICAL HISTORY: Past Medical History:  Diagnosis Date   Anxiety    prev on prozac   Chest pain    Felt to be noncardiac in the past   Chronic rectal fissure    Colon polyp    Dyslipidemia    Fibromyalgia    GERD (gastroesophageal reflux disease)    Barrett's esophagus   Hiatal hernia    Lumbar spine pain    Complex lumbar spine surgery at [facility], 2012, complicated, MRSA, much of the appliance is removed, patient on chronic antibiotics   Melanoma (HCC)    Mitral regurgitation    a. 2007 Echo Mild MR; b. 10/2016 Echo: EF 55-60%, no rwma, GrI DD, Mild MR.   Nausea and vomiting 08/22/2021   Osteoarthritis    Scoliosis    Sinusitis    Multiple sinus operations in the past   Statin intolerance    As of 2009, no further attempts to use statins   Thyroid  nodule     PAST SURGICAL HISTORY: Past Surgical History:  Procedure Laterality Date   ABDOMINAL HYSTERECTOMY     BACK SURGERY     CHOLECYSTECTOMY     COLONOSCOPY WITH PROPOFOL  N/A 08/07/2022   Procedure: COLONOSCOPY WITH PROPOFOL ;  Surgeon: Therisa Bi, [name] [name];  Location: [facility] ENDOSCOPY;  Service: Gastroenterology;  Laterality: N/A;   ESOPHAGOGASTRODUODENOSCOPY (EGD) WITH PROPOFOL  N/A 08/07/2022   Procedure: ESOPHAGOGASTRODUODENOSCOPY (EGD) WITH PROPOFOL ;  Surgeon: Therisa Bi, [name] [name];  Location: [facility]  ENDOSCOPY;  Service: Gastroenterology;  Laterality: N/A;   FOOT SURGERY     GIVENS CAPSULE STUDY N/A 10/09/2022   Procedure: GIVENS CAPSULE STUDY;  Surgeon: Therisa Bi, [name] [name];  Location: [facility] ENDOSCOPY;  Service: Gastroenterology;  Laterality: N/A;   HERNIA REPAIR     KNEE SURGERY Right 08/08/2021   total knee replacement   NASAL SINUS SURGERY      FAMILY HISTORY: Family History  Problem Relation Age of Onset   GER disease Mother    Dementia Mother    Breast cancer Mother    Heart disease Father        MI at  54   Scoliosis Daughter    Colon cancer Neg Hx     HEALTH MAINTENANCE: Social History   Tobacco Use   Smoking status: Never    Passive exposure: Never   Smokeless tobacco: Never  Vaping Use   Vaping status: Never Used  Substance Use Topics   Alcohol use: No   Drug use: No     Allergies  Allergen Reactions   Other Other (See Comments)    Other Reaction: when taking for an extended period of time causes mouth ulcers and esophagus irritation Other Reaction: when taking for an extended period of time causes mouth ulcers and esophagus irritation    Fenofibric Acid Other (See Comments)    GI upset   Nsaids     REACTION: No long term use   Statins Other (See Comments)    Intolerant, myalgias Intolerant, myalgias Intolerant, myalgias Intolerant, myalgias Intolerant, myalgias Intolerant, myalgias    Trilipix [Choline Fenofibrate ]     GI upset   Wasp Venom Swelling    Current Outpatient Medications  Medication Sig Dispense Refill   ALPHA LIPOIC ACID PO Take 1 tablet by mouth daily. Daily     amitriptyline  (ELAVIL ) 10 MG tablet TAKE 1 TABLET (10 MG TOTAL) BY MOUTH AT BEDTIME FOR HEADACHE PREVENTION 90 tablet 2   calcium carbonate (OS-CAL) 600 MG TABS Take 1,200 mg by mouth 2 (two) times daily with a meal. (Patient not taking: Reported on 12/26/2022)     cholecalciferol (VITAMIN D) 1000 UNITS tablet Take 1,000 Units by mouth daily.     ciclopirox (LOPROX) 0.77 % cream Apply 1 g/mL topically 2 (two) times daily.     clotrimazole -betamethasone  (LOTRISONE ) cream APPLY TOPICALLY 2 (TWO) TIMES DAILY AS NEEDED. 30 g 0   diclofenac  sodium (VOLTAREN ) 1 % GEL Apply 2-4 grams to affected area up to 4 times daily 4 Tube 2   fenofibrate  micronized (LOFIBRA) 134 MG capsule TAKE 1 CAPSULE (134 MG TOTAL) BY MOUTH AT BEDTIME FOR CHOLESTEROL. 90 capsule 2   fish oil-omega-3 fatty acids 1000 MG capsule Take 2 g by mouth 2 (two) times daily.     gabapentin  (NEURONTIN ) 100 MG capsule TAKE 1  CAPSULE BY MOUTH THREE TIMES A DAY 270 capsule 0   Ginger, Zingiber officinalis, (GINGER PO) Take 1 tablet by mouth daily.     levothyroxine  (SYNTHROID ) 50 MCG tablet TAKE 1 TABLET BY MOUTH EVERY MORNING WITH WATER ON AN EMPTY STOMACH. NO OTHER FOODS/MEDS FOR 30 MINS 90 tablet 2   metoprolol  tartrate (LOPRESSOR ) 25 MG tablet TAKE 1 TAB BY MOUTH TWICE A DAY AND EXTRA TABLET AS NEEDED FOR BREAKTHROUGH PALPITATIONS AS DIRECTED 180 tablet 3   Multiple Vitamins-Minerals (MULTIVITAMIN WITH MINERALS) tablet Take 1 tablet by mouth daily.     NON FORMULARY Take 1 capsule by mouth daily. Tumeric  Daily     omeprazole  (PRILOSEC ) 20 MG capsule TAKE 2 CAPS (40 MG TOTAL) BY MOUTH IN THE MORNING AND AT BEDTIME. 360 capsule 0   sulfamethoxazole -trimethoprim  (BACTRIM  DS) 800-160 MG tablet TAKE 1 TABLET (160 MG OF TRIMETHOPRIM  TOTAL) BY MOUTH 2 (TWO) TIMES DAILY 180 tablet 3   tretinoin (RETIN-A) 0.05 % cream Apply 1 application topically daily as needed (as directed per dermatologist).   3   No current facility-administered medications for this visit.    OBJECTIVE: Vitals:   04/21/23 1314  BP: (!) 126/59  Pulse: 90  Resp: 14  Temp: 98.4 F (36.9 C)  SpO2: 96%     Body mass index is 35.2 kg/m.      General: Well-developed, well-nourished, no acute distress. Eyes: Pink conjunctiva, anicteric sclera. HEENT: Normocephalic, moist mucous membranes, clear oropharnyx. Lungs: Clear to auscultation bilaterally. Heart: Regular rate and rhythm. No rubs, murmurs, or gallops. Abdomen: Soft, nontender, nondistended. No organomegaly noted, normoactive bowel sounds. Musculoskeletal: No edema, cyanosis, or clubbing. Neuro: Alert, answering all questions appropriately. Cranial nerves grossly intact. Skin: No rashes or petechiae noted. Psych: Normal affect. Lymphatics: No cervical, calvicular, axillary or inguinal LAD.   LAB RESULTS:  Lab Results  Component Value Date   NA 139 02/07/2023   K 4.1 02/07/2023    CL 103 02/07/2023   CO2 27 02/07/2023   GLUCOSE 106 (H) 02/07/2023   BUN 23 02/07/2023   CREATININE 0.91 02/07/2023   CALCIUM 8.9 02/07/2023   PROT 6.0 (L) 02/07/2023   ALBUMIN 4.0 02/07/2023   AST 27 02/07/2023   ALT 20 02/07/2023   ALKPHOS 52 02/07/2023   BILITOT 0.3 02/07/2023   GFRNONAA >60 02/07/2023    Lab Results  Component Value Date   WBC 4.3 04/21/2023   NEUTROABS 2.7 04/21/2023   HGB 10.6 (L) 04/21/2023   HCT 33.6 (L) 04/21/2023   MCV 99.7 04/21/2023   PLT 190 04/21/2023    Lab Results  Component Value Date   TIBC 503 (H) 04/21/2023   TIBC 462 (H) 02/07/2023   TIBC 487 (H) 12/26/2022   FERRITIN 66 04/21/2023   FERRITIN 236 02/07/2023   FERRITIN 54 12/26/2022   IRONPCTSAT 11 04/21/2023   IRONPCTSAT 17 02/07/2023   IRONPCTSAT 14 12/26/2022     STUDIES: No results found.  ASSESSMENT AND PLAN:   [name] [name] is a 88 y.o. female with pmh of hyperlipidemia, GERD, anxiety, fibromyalgia, osteoarthritis melanoma, MR was referred to hematology clinic for further management of iron deficiency anemia.  #Iron deficiency anemia -Of unclear source.  Could not tolerate oral iron due to nausea.  Colonoscopy from April 2024 by Dr. Therisa showed few polyps with pathology showing tubular adenoma.  No repeat advised due to age.  Upper endoscopy showed hematin in gastric antrum and 2 superficial nonbleeding gastric ulcer.  She was started on Prilosec  40 mg twice daily which she reports compliance to.    -Hemoglobin decreased to 6.9 and low ferritin.  S/p 1 unit PRBC on 09/20/2022. Was restarted on IV Feraheme .  s/p capsule endoscopy on 10/25/2022 which showed minimal blood-tinged material seen at 50-minute 6-second amount but only a few seconds.  Was recommended close monitoring of iron level and hemoglobin and if it drops then would need balloon enteroscopy to rule out small underlying AVM.    -Last Feraheme  was in September 2024.  Hemoglobin today 10.6. Ferritin is normal at  66.  Saturation is slightly low at 11%.  Considering her hemoglobin is improved  to 10.6 from prior 9.3 and normal ferritin we will hold off on iron infusion at this time.  She can continue with oral iron supplementation.  Her MCV is borderline high.  We did discuss about possibility of MDS because she has mild anemia despite normal iron level in the past.  Discussed about rationale for bone marrow biopsy.  She would like to hold off at this time which is reasonable considering she is maintaining her hemoglobin well and it would not change the management.  I will recheck myeloma panel, EPO level, B12 and folate next time along with iron studies to complete the workup.  Patient prefers repeat labs in 2 months.  Will schedule.  # Vitamin B12 deficiency -S/p IM cyanocobalamin  1000 mcg X 4 -Continue with B12 1000 mcg oral daily -Recheck B12 level next time.  # Shortness of breath on exertion -Discussed with the patient that it is less likely to be related to her mild anemia. -Referral to pulmonary  Orders Placed This Encounter  Procedures   CBC with Differential (Cancer Center Only)   Ferritin   Iron and TIBC ([facility] ONLY)   Ambulatory referral to Pulmonology   RTC in about 2 months for [name] [name] visit, labs.  Patient expressed understanding and was in agreement with this plan. She also understands that She can call clinic at any time with any questions, concerns, or complaints.   I spent a total of 25 minutes reviewing chart data, face-to-face evaluation with the patient, counseling and coordination of care as detailed above.  [name] Rous, [name] [name]   04/24/2023 3:22 PM

## 2023-04-22 DIAGNOSIS — K080 Exfoliation of teeth due to systemic causes: Secondary | ICD-10-CM | POA: Diagnosis not present

## 2023-04-24 ENCOUNTER — Encounter: Payer: Self-pay | Admitting: Internal Medicine

## 2023-04-30 ENCOUNTER — Encounter: Payer: Self-pay | Admitting: Student in an Organized Health Care Education/Training Program

## 2023-05-14 ENCOUNTER — Encounter: Payer: Self-pay | Admitting: Internal Medicine

## 2023-05-16 ENCOUNTER — Other Ambulatory Visit: Payer: Self-pay | Admitting: Primary Care

## 2023-05-16 DIAGNOSIS — Z1231 Encounter for screening mammogram for malignant neoplasm of breast: Secondary | ICD-10-CM

## 2023-05-19 ENCOUNTER — Telehealth: Payer: Self-pay | Admitting: Internal Medicine

## 2023-05-19 DIAGNOSIS — D649 Anemia, unspecified: Secondary | ICD-10-CM

## 2023-05-19 NOTE — Telephone Encounter (Signed)
Pt daughter, Trula Ore, called and staetd that she's been trying to get in touch without office. She wants to proceed with the bone marrow biopsy. She would like a call back at [phone]

## 2023-05-20 ENCOUNTER — Encounter: Payer: Self-pay | Admitting: Internal Medicine

## 2023-05-20 ENCOUNTER — Telehealth: Payer: Self-pay

## 2023-05-20 NOTE — Telephone Encounter (Signed)
 Patient's daughter Tawni reached out that patient would like to proceed with bone marrow biopsy to rule out any bone marrow pathologies which may be contributing to her anemia despite improvement in iron levels.  I spoke with Tawni again to confirm the plan.  Will place the order and schedule.

## 2023-05-20 NOTE — Telephone Encounter (Signed)
Called and spoke with patient's daughter Trula Ore about her mother [name] [name], having the bone marrow biopsy done on 06/02/2023 at 9:30 am with arrival time at 8:30 am. They understood and agreed with this plan.

## 2023-05-20 NOTE — Telephone Encounter (Signed)
Dr. Mervyn Skeeters spoke with daughter. Ok to proceed with bone marrow biopsy. Order placed. Msg sent to scheduling to arrange.

## 2023-05-22 ENCOUNTER — Ambulatory Visit: Payer: Medicare Other | Admitting: Medical

## 2023-05-23 ENCOUNTER — Ambulatory Visit: Payer: Medicare Other | Admitting: Student

## 2023-05-28 ENCOUNTER — Ambulatory Visit (INDEPENDENT_AMBULATORY_CARE_PROVIDER_SITE_OTHER): Payer: Medicare Other | Admitting: Physician Assistant

## 2023-05-28 ENCOUNTER — Other Ambulatory Visit: Payer: Self-pay | Admitting: Cardiovascular Disease

## 2023-05-28 DIAGNOSIS — M1712 Unilateral primary osteoarthritis, left knee: Secondary | ICD-10-CM | POA: Diagnosis not present

## 2023-05-28 MED ORDER — METHYLPREDNISOLONE ACETATE 40 MG/ML IJ SUSP
40.0000 mg | INTRAMUSCULAR | Status: AC | PRN
  Administered 2023-05-28: 40 mg via INTRA_ARTICULAR

## 2023-05-28 MED ORDER — LIDOCAINE HCL 1 % IJ SOLN
3.0000 mL | INTRAMUSCULAR | Status: AC | PRN
  Administered 2023-05-28: 3 mL

## 2023-05-28 NOTE — Progress Notes (Signed)
   Procedure Note  Patient: [name] [name]             Date of Birth: 1936/03/23           MRN: [id number]             Visit Date: 05/28/2023  HPI: Mrs. [name] [name] is well-known to Dr. Raye Sorrow service comes in today requesting left knee injection.  Last injection was 02/26/2023.  She states the injection helped for about 6 to 8 weeks.  She notes she is having medial knee pain and some stiffness.  Denies any new injury.  Review of systems: Denies any fevers chills or ongoing infections.  General: Well-developed well-nourished female who walks with a antalgic gait.  No assistive device.  Significant kyphosis. Left knee: No abnormal warmth erythema or effusion.  Good range of motion.  Patellofemoral crepitus.  Procedures: Visit Diagnoses:  1. Unilateral primary osteoarthritis, left knee     Large Joint Inj: L knee on 05/28/2023 10:38 AM Indications: pain Details: 22 G 1.5 in needle, anterolateral approach  Arthrogram: No  Medications: 3 mL lidocaine 1 %; 40 mg methylPREDNISolone acetate 40 MG/ML Outcome: tolerated well, no immediate complications Procedure, treatment alternatives, risks and benefits explained, specific risks discussed. Consent was given by the patient. Immediately prior to procedure a time out was called to verify the correct patient, procedure, equipment, support staff and site/side marked as required. Patient was prepped and draped in the usual sterile fashion.     Plan: She will follow-up as needed.  She knows to wait at least 3 months between injections.  Questions encouraged and answered

## 2023-05-30 ENCOUNTER — Encounter: Payer: Self-pay | Admitting: Internal Medicine

## 2023-05-30 ENCOUNTER — Other Ambulatory Visit (HOSPITAL_COMMUNITY): Payer: Self-pay | Admitting: Student

## 2023-05-30 ENCOUNTER — Other Ambulatory Visit: Payer: Self-pay | Admitting: Radiology

## 2023-05-30 ENCOUNTER — Ambulatory Visit
Admission: RE | Admit: 2023-05-30 | Discharge: 2023-05-30 | Disposition: A | Discharge status: Home / Self Care | Payer: Medicare Other | Source: Ambulatory Visit | Attending: Primary Care | Admitting: Primary Care

## 2023-05-30 DIAGNOSIS — Z1231 Encounter for screening mammogram for malignant neoplasm of breast: Secondary | ICD-10-CM | POA: Diagnosis not present

## 2023-05-30 DIAGNOSIS — D508 Other iron deficiency anemias: Secondary | ICD-10-CM

## 2023-05-30 NOTE — Progress Notes (Signed)
Patient for IR Bone Marrow Biopsy on Monday 06/02/23, I called and LVM for the patient's daughter, Trula Ore on the phone and gave pre-procedure instructions. VM made Christina aware to have the patient here at 8:30a, NPO after MN prior to procedure as well as driver post procedure/recovery/discharge. Called 05/30/23

## 2023-06-01 NOTE — H&P (Signed)
Chief Complaint: Patient was seen in consultation today for iron deficiency anemia with consideration for bone marrow biopsy and aspiration.  Referring Provider(s): Dr. Cathey Endow, MD  Supervising Physician: Roanna Banning  Patient Status: [facility] - Out-pt  Patient is Full Code  History of Present Illness: [name] [name] is a 88 y.o. female with PMHx of HLD, GERD w/ Barrett's esophagus, anxiety, fibromyalgia, scoliosis, hiatal hernia, osteoarthritis, melanoma, and mitral regurgitation.  Per Dr. Neena Rhymes progress note on 04/21/23: #Iron deficiency anemia -Of unclear source.  Could not tolerate oral iron due to nausea.  Colonoscopy from April 2024 by Dr. Tobi Bastos showed few polyps with pathology showing tubular adenoma.  No repeat advised due to age.  Upper endoscopy showed hematin in gastric antrum and 2 superficial nonbleeding gastric ulcer.  She was started on Prilosec 40 mg twice daily which she reports compliance to.    -Hemoglobin decreased to 6.9 and low ferritin.  S/p 1 unit PRBC on 09/20/2022. Was restarted on IV Feraheme.  s/p capsule endoscopy on 10/25/2022 which showed minimal blood-tinged material seen at 50-minute 6-second amount but only a few seconds.  Was recommended close monitoring of iron level and hemoglobin and if it drops then would need balloon enteroscopy to rule out small underlying AVM.    -We did discuss about possibility of MDS because she has mild anemia despite normal iron level in the past. Discussed about rationale for bone marrow biopsy.   Interventional Radiology was requested for bone marrow biopsy and aspiration. Patient is scheduled for same in IR today.  All labs and medications are within acceptable parameters. No pertinent allergies. Patient has been NPO since midnight.    Currently without any significant complaints. Patient alert and laying in bed,calm. Denies any fevers, headache, chest pain, SOB, cough, abdominal pain, nausea, vomiting or bleeding.      Past Medical History:  Diagnosis Date   Anxiety    prev on prozac   Chest pain    Felt to be noncardiac in the past   Chronic rectal fissure    Colon polyp    Dyslipidemia    Fibromyalgia    GERD (gastroesophageal reflux disease)    Barrett's esophagus   Hiatal hernia    Lumbar spine pain    Complex lumbar spine surgery at [facility], 2012, complicated, MRSA, much of the appliance is removed, patient on chronic antibiotics   Melanoma (HCC)    Mitral regurgitation    a. 2007 Echo Mild MR; b. 10/2016 Echo: EF 55-60%, no rwma, GrI DD, Mild MR.   Nausea and vomiting 08/22/2021   Osteoarthritis    Scoliosis    Sinusitis    Multiple sinus operations in the past   Statin intolerance    As of 2009, no further attempts to use statins   Thyroid nodule     Past Surgical History:  Procedure Laterality Date   ABDOMINAL HYSTERECTOMY     BACK SURGERY     CHOLECYSTECTOMY     COLONOSCOPY WITH PROPOFOL N/A 08/07/2022   Procedure: COLONOSCOPY WITH PROPOFOL;  Surgeon: [name], MD;  Location: [facility] ENDOSCOPY;  Service: Gastroenterology;  Laterality: N/A;   ESOPHAGOGASTRODUODENOSCOPY (EGD) WITH PROPOFOL N/A 08/07/2022   Procedure: ESOPHAGOGASTRODUODENOSCOPY (EGD) WITH PROPOFOL;  Surgeon: [name], MD;  Location: [facility] ENDOSCOPY;  Service: Gastroenterology;  Laterality: N/A;   FOOT SURGERY     GIVENS CAPSULE STUDY N/A 10/09/2022   Procedure: GIVENS CAPSULE STUDY;  Surgeon: [name], MD;  Location: [facility] ENDOSCOPY;  Service: Gastroenterology;  Laterality: N/A;   HERNIA REPAIR     KNEE SURGERY Right 08/08/2021   total knee replacement   NASAL SINUS SURGERY      Allergies: Other, Fenofibric acid, Nsaids, Statins, Trilipix [choline fenofibrate], and Wasp venom  Medications: Prior to Admission medications   Medication Sig Start Date End Date Taking? Authorizing Provider  ALPHA LIPOIC ACID PO Take 1 tablet by mouth daily. Daily    [provider]  amitriptyline (ELAVIL) 10 MG  tablet TAKE 1 TABLET (10 MG TOTAL) BY MOUTH AT BEDTIME FOR HEADACHE PREVENTION 01/19/23   Doreene Nest, NP  calcium carbonate (OS-CAL) 600 MG TABS Take 1,200 mg by mouth 2 (two) times daily with a meal.    [provider]  cholecalciferol (VITAMIN D) 1000 UNITS tablet Take 1,000 Units by mouth daily.    [provider]  ciclopirox (LOPROX) 0.77 % cream Apply 1 g/mL topically 2 (two) times daily. 05/27/22   [provider]  clotrimazole-betamethasone (LOTRISONE) cream APPLY TOPICALLY 2 (TWO) TIMES DAILY AS NEEDED. 03/08/20   Doreene Nest, NP  diclofenac sodium (VOLTAREN) 1 % GEL Apply 2-4 grams to affected area up to 4 times daily 05/27/18   Pollyann Savoy, MD  fenofibrate micronized (LOFIBRA) 134 MG capsule TAKE 1 CAPSULE (134 MG TOTAL) BY MOUTH AT BEDTIME FOR CHOLESTEROL. 12/06/22   Doreene Nest, NP  fish oil-omega-3 fatty acids 1000 MG capsule Take 2 g by mouth 2 (two) times daily.    [provider]  gabapentin (NEURONTIN) 100 MG capsule TAKE 1 CAPSULE BY MOUTH THREE TIMES A DAY 04/02/23   Kathryne Hitch, MD  Ginger, Zingiber officinalis, (GINGER PO) Take 1 tablet by mouth daily.    [provider]  levothyroxine (SYNTHROID) 50 MCG tablet TAKE 1 TABLET BY MOUTH EVERY MORNING WITH WATER ON AN EMPTY STOMACH. NO OTHER FOODS/MEDS FOR 30 MINS 12/06/22   Doreene Nest, NP  metoprolol tartrate (LOPRESSOR) 25 MG tablet TAKE 1 TAB BY MOUTH TWICE A DAY AND EXTRA TABLET AS NEEDED FOR BREAKTHROUGH PALPITATIONS AS DIRECTED 05/28/23   Antonieta Iba, MD  Multiple Vitamins-Minerals (MULTIVITAMIN WITH MINERALS) tablet Take 1 tablet by mouth daily.    [provider]  NON FORMULARY Take 1 capsule by mouth daily. Tumeric      Daily    [provider]  omeprazole (PRILOSEC) 20 MG capsule TAKE 2 CAPS (40 MG TOTAL) BY MOUTH IN THE MORNING AND AT BEDTIME. 11/05/22 02/03/23  [name], MD  sulfamethoxazole-trimethoprim  (BACTRIM DS) 800-160 MG tablet TAKE 1 TABLET (160 MG OF TRIMETHOPRIM TOTAL) BY MOUTH 2 (TWO) TIMES DAILY 10/22/22   Doreene Nest, NP  tretinoin (RETIN-A) 0.05 % cream Apply 1 application topically daily as needed (as directed per dermatologist).  12/14/13   [provider]     Family History  Problem Relation Age of Onset   GER disease Mother    Dementia Mother    Breast cancer Mother 40   Heart disease Father        MI at 22   Scoliosis Daughter    Colon cancer Neg Hx     Social History   Socioeconomic History   Marital status: Married    Spouse name: Not on file   Number of children: Not on file   Years of education: Not on file   Highest education level: Not on file  Occupational History   Not on file  Tobacco Use  Smoking status: Never    Passive exposure: Never   Smokeless tobacco: Never  Vaping Use   Vaping status: Never Used  Substance and Sexual Activity   Alcohol use: No   Drug use: No   Sexual activity: Not on file  Other Topics Concern   Not on file  Social History Narrative   From [place]   Married (second [phone]   3 kids, 2 of whom are local   Retired, Tree surgeon   Social Drivers of Health   Financial Resource Strain: Low Risk  (10/07/2022)   Overall Financial Resource Strain (CARDIA)    Difficulty of Paying Living Expenses: Not hard at all  Food Insecurity: No Food Insecurity (10/07/2022)   Hunger Vital Sign    Worried About Running Out of Food in the Last Year: Never true    Ran Out of Food in the Last Year: Never true  Transportation Needs: No Transportation Needs (10/07/2022)   PRAPARE - Administrator, Civil Service (Medical): No    Lack of Transportation (Non-Medical): No  Physical Activity: Sufficiently Active (10/07/2022)   Exercise Vital Sign    Days of Exercise per Week: 3 days    Minutes of Exercise per Session: 50 min  Stress: No Stress Concern Present (10/07/2022)   [employer] of  Occupational Health - Occupational Stress Questionnaire    Feeling of Stress : Not at all  Social Connections: Moderately Isolated (10/07/2022)   Social Connection and Isolation Panel [NHANES]    Frequency of Communication with Friends and Family: More than three times a week    Frequency of Social Gatherings with Friends and Family: More than three times a week    Attends Religious Services: Never    Database administrator or Organizations: No    Attends Banker Meetings: Never    Marital Status: Married     Review of Systems: A 12 point ROS discussed and pertinent positives are indicated in the HPI above.  All other systems are negative.   Vital Signs: There were no vitals taken for this visit.  Advance Care Plan: The advanced care place/surrogate decision maker was discussed at the time of visit and the patient did not wish to discuss or was not able to name a surrogate decision maker or provide an advance care plan.  Physical Exam Constitutional:      General: She is not in acute distress.    Appearance: Normal appearance.  HENT:     Mouth/Throat:     Mouth: Mucous membranes are moist.  Cardiovascular:     Rate and Rhythm: Normal rate and regular rhythm.     Heart sounds: No murmur heard. Pulmonary:     Effort: Pulmonary effort is normal.     Breath sounds: Normal breath sounds. No wheezing.  Musculoskeletal:        General: Normal range of motion.     Comments: Lower back pain and right hip pain  Skin:    General: Skin is warm and dry.  Neurological:     Mental Status: She is alert and oriented to person, place, and time.  Psychiatric:        Mood and Affect: Mood normal.        Behavior: Behavior normal.        Thought Content: Thought content normal.        Judgment: Judgment normal.     Imaging: No results found.  Labs:  CBC: Recent  Labs    10/28/22 1442 12/26/22 1305 02/07/23 1320 04/21/23 1256  WBC 4.7 4.6 4.1 4.3  HGB 10.9* 9.8*  9.3* 10.6*  HCT 34.5* 31.6* 29.3* 33.6*  PLT 188 206 212 190    COAGS: No results for input(s): "INR", "APTT" in the last 8760 hours.  BMP: Recent Labs    10/15/22 0734 02/07/23 1320  NA 140 139  K 4.2 4.1  CL 104 103  CO2 27 27  GLUCOSE 95 106*  BUN 19 23  CALCIUM 9.4 8.9  CREATININE 0.90 0.91  GFRNONAA  --  >60    LIVER FUNCTION TESTS: Recent Labs    10/15/22 0734 02/07/23 1320  BILITOT 0.4 0.3  AST 20 27  ALT 14 20  ALKPHOS 52 52  PROT 6.2 6.0*  ALBUMIN 4.3 4.0    TUMOR MARKERS: No results for input(s): "AFPTM", "CEA", "CA199", "CHROMGRNA" in the last 8760 hours.  Assessment and Plan: Per Dr. Neena Rhymes progress note on 04/21/23: #Iron deficiency anemia -Of unclear source.  Could not tolerate oral iron due to nausea.  Colonoscopy from April 2024 by Dr. Tobi Bastos showed few polyps with pathology showing tubular adenoma.  No repeat advised due to age.  Upper endoscopy showed hematin in gastric antrum and 2 superficial nonbleeding gastric ulcer.  She was started on Prilosec 40 mg twice daily which she reports compliance to.    -Hemoglobin decreased to 6.9 and low ferritin.  S/p 1 unit PRBC on 09/20/2022. Was restarted on IV Feraheme.    -We did discuss about possibility of MDS because she has mild anemia despite normal iron level in the past. Discussed about rationale for bone marrow biopsy.    Patient presents for scheduled bone marrow biopsy and aspiration in IR today.  Risks and benefits of bone marrow biopsy and aspiration  was discussed with the patient and/or patient's family including, but not limited to bleeding, infection, damage to adjacent structures or low yield requiring additional tests.  All of the questions were answered and there is agreement to proceed.  Consent signed and in chart.   Thank you for allowing our service to participate in ARIJANA NARAYAN 's care.  Electronically Signed: Sable Feil, PA-C   06/01/2023, 2:31 PM      I spent a  total of  30 Minutes  in face to face in clinical consultation, greater than 50% of which was counseling/coordinating care for iron deficiency anemia with consideration for bone marrow biopsy and aspiration.

## 2023-06-02 ENCOUNTER — Encounter: Payer: Self-pay | Admitting: Radiology

## 2023-06-02 ENCOUNTER — Other Ambulatory Visit: Payer: Self-pay

## 2023-06-02 ENCOUNTER — Ambulatory Visit
Admission: RE | Admit: 2023-06-02 | Discharge: 2023-06-02 | Disposition: A | Discharge status: Home / Self Care | Payer: Medicare Other | Source: Ambulatory Visit | Attending: Internal Medicine | Admitting: Radiology

## 2023-06-02 DIAGNOSIS — D704 Cyclic neutropenia: Secondary | ICD-10-CM | POA: Diagnosis not present

## 2023-06-02 DIAGNOSIS — Z860100 Personal history of colon polyps, unspecified: Secondary | ICD-10-CM | POA: Insufficient documentation

## 2023-06-02 DIAGNOSIS — D649 Anemia, unspecified: Secondary | ICD-10-CM

## 2023-06-02 DIAGNOSIS — D509 Iron deficiency anemia, unspecified: Secondary | ICD-10-CM | POA: Insufficient documentation

## 2023-06-02 DIAGNOSIS — D508 Other iron deficiency anemias: Secondary | ICD-10-CM

## 2023-06-02 DIAGNOSIS — K259 Gastric ulcer, unspecified as acute or chronic, without hemorrhage or perforation: Secondary | ICD-10-CM | POA: Diagnosis not present

## 2023-06-02 DIAGNOSIS — Z8379 Family history of other diseases of the digestive system: Secondary | ICD-10-CM | POA: Diagnosis not present

## 2023-06-02 HISTORY — PX: IR BONE MARROW BIOPSY & ASPIRATION: IMG5727

## 2023-06-02 LAB — CBC WITH DIFFERENTIAL/PLATELET
Abs Immature Granulocytes: 0.02 10*3/uL (ref 0.00–0.07)
Basophils Absolute: 0 10*3/uL (ref 0.0–0.1)
Basophils Relative: 1 %
Eosinophils Absolute: 0.1 10*3/uL (ref 0.0–0.5)
Eosinophils Relative: 3 %
HCT: 36.4 % (ref 36.0–46.0)
Hemoglobin: 11.6 g/dL — ABNORMAL LOW (ref 12.0–15.0)
Immature Granulocytes: 0 %
Lymphocytes Relative: 29 %
Lymphs Abs: 1.3 10*3/uL (ref 0.7–4.0)
MCH: 31 pg (ref 26.0–34.0)
MCHC: 31.9 g/dL (ref 30.0–36.0)
MCV: 97.3 fL (ref 80.0–100.0)
Monocytes Absolute: 0.4 10*3/uL (ref 0.1–1.0)
Monocytes Relative: 10 %
Neutro Abs: 2.6 10*3/uL (ref 1.7–7.7)
Neutrophils Relative %: 57 %
Platelets: 221 10*3/uL (ref 150–400)
RBC: 3.74 MIL/uL — ABNORMAL LOW (ref 3.87–5.11)
RDW: 13.5 % (ref 11.5–15.5)
WBC: 4.5 10*3/uL (ref 4.0–10.5)
nRBC: 0 % (ref 0.0–0.2)

## 2023-06-02 MED ORDER — FENTANYL CITRATE (PF) 100 MCG/2ML IJ SOLN
INTRAMUSCULAR | Status: AC | PRN
  Administered 2023-06-02: 25 ug via INTRAVENOUS

## 2023-06-02 MED ORDER — HEPARIN SOD (PORK) LOCK FLUSH 100 UNIT/ML IV SOLN
INTRAVENOUS | Status: AC
  Filled 2023-06-02: qty 5

## 2023-06-02 MED ORDER — MIDAZOLAM HCL 2 MG/2ML IJ SOLN
INTRAMUSCULAR | Status: AC | PRN
  Administered 2023-06-02: .5 mg via INTRAVENOUS

## 2023-06-02 MED ORDER — FENTANYL CITRATE (PF) 100 MCG/2ML IJ SOLN
INTRAMUSCULAR | Status: AC
  Filled 2023-06-02: qty 2

## 2023-06-02 MED ORDER — MIDAZOLAM HCL 2 MG/2ML IJ SOLN
INTRAMUSCULAR | Status: AC
  Filled 2023-06-02: qty 2

## 2023-06-02 MED ORDER — LIDOCAINE 1 % OPTIME INJ - NO CHARGE
10.0000 mL | Freq: Once | INTRAMUSCULAR | Status: AC
  Administered 2023-06-02: 10 mL via INTRADERMAL
  Filled 2023-06-02: qty 10

## 2023-06-02 NOTE — Procedures (Signed)
Vascular and Interventional Radiology Procedure Note  Patient: [name] [name] DOB: 09-01-1935 Medical Record Number: [id number] Note Date/Time: 06/02/23 9:24 AM   Performing Physician: Roanna Banning, MD Assistant(s): None  Diagnosis: Anemia   Procedure: BONE MARROW ASPIRATION and BIOPSY  Anesthesia: Conscious Sedation Complications: None Estimated Blood Loss: Minimal Specimens: Sent for Pathology  Findings:  Successful Fluoroscopy-guided bone marrow aspiration and biopsy A total of 1 cores were obtained. Hemostasis of the tract was achieved using Manual Pressure.  Plan: Bed rest for 1 hours.  See detailed procedure note with images in PACS. The patient tolerated the procedure well without incident or complication and was returned to Recovery in stable condition.    Roanna Banning, MD [facility] Rad[facility]   Pager. [phone] Clinic. [phone]

## 2023-06-02 NOTE — Progress Notes (Signed)
Patient clinically stable post IR BMB per Dr Milford Cage, tolerated well. Vitals stable pre and post procedure. Received Versed 0.5 mg, along with Fentanyl 25 mcg IV  for procedure. Denies complaints immediately post procedure. Report given to Mar Daring RN post procedure/specials/3

## 2023-06-04 ENCOUNTER — Institutional Professional Consult (permissible substitution): Payer: Medicare Other | Admitting: Student in an Organized Health Care Education/Training Program

## 2023-06-04 LAB — SURGICAL PATHOLOGY

## 2023-06-04 NOTE — Progress Notes (Signed)
 Cardiology Clinic Note   Date: 06/09/2023 ID: Myla, Mauriello 1935-12-21, MRN [id number]  Primary Cardiologist:  Julien Nordmann, MD  Chief Complaint   [name] [name] is a 88 y.o. female who presents to the clinic today for routine follow up.   Patient Profile   [name] [name] is followed by Dr. Mariah Milling for the history outlined below.      Past medical history significant for: Mitral valve regurgitation. Echo 01/14/2022: EF 55 to 60%.  No RWMA.  Normal diastolic parameters.  Normal RV size/function.  Mitral valve is normal in structure.  Mild to moderate MR.  No mitral valve stenosis. Hyperlipidemia. Lipid panel 10/15/2022: LDL 67, HDL 49, TG 181, total 152. Palpitations. Iron deficiency anemia. Hiatal hernia. Fibromyalgia. RA.  In summary, patient has a history of stable mild to moderate mitral valve regurgitation.  Last echo October 2023 as detailed above. She has a long history of palpitations well controlled on metoprolol.   Patient was last seen in the office by Dr. Mariah Milling on 05/20/2022 for routine follow-up.  She complained of fatigue and dyspnea.  She had recently been diagnosed with iron deficiency anemia and PCP attributed symptoms to that.  She had an echo in October 2023 as above.     History of Present Illness    Today, patient is accompanied by her husband. She reports increased dyspnea with less exertion than previous. Her husband reports he has recently noticed patient seems to start "breathing hard" when she is doing activities around the house. Patient has been dealing with anemia and has been undergoing a full workup including a bone biopsy and pending evaluation with pulmonology. She is also receiving iron infusions. She has increased fatigue she attributes to the anemia. She has occasional reflux. She denies chest pain, pressure or tightness. No lower extremity edema, orthopnea or PND. She continues to have occasional palpitations described as feeling like her heart is  racing but this is stable and well controlled with metoprolol.    ROS: All other systems reviewed and are otherwise negative except as noted in History of Present Illness.  EKGs/Labs Reviewed    EKG Interpretation Date/Time:  Monday June 09 2023 09:23:11 EST Ventricular Rate:  91 PR Interval:  164 QRS Duration:  132 QT Interval:  394 QTC Calculation: 484 R Axis:   -41  Text Interpretation: Normal sinus rhythm Left axis deviation Left ventricular hypertrophy with QRS widening and repolarization abnormality ( R in aVL , Cornell product ) LBBB Left anterior fasicular block When compared with ECG of 05/20/2022 LAFB and LBBB new Reconfirmed by Carlos Levering [phone]) on 06/09/2023 11:26:08 AM   02/07/2023: ALT 20; AST 27; BUN 23; Creatinine, Ser 0.91; Potassium 4.1; Sodium 139   06/02/2023: Hemoglobin 11.6; WBC 4.5   10/15/2022: TSH 3.55    Physical Exam    VS:  BP 124/60   Pulse 91   Ht 4\' 8"  (1.422 m)   Wt 156 lb 6.4 oz (70.9 kg)   SpO2 93%   BMI 35.06 kg/m  , BMI Body mass index is 35.06 kg/m.  GEN: Well nourished, well developed, in no acute distress. Neck: No JVD or carotid bruits. Cardiac:  RRR. No murmurs. No rubs or gallops.   Respiratory:  Respirations regular and unlabored. Clear to auscultation without rales, wheezing or rhonchi. GI: Soft, nontender, nondistended. Extremities: Radials/DP/PT 2+ and equal bilaterally. No clubbing or cyanosis. No edema.  Skin: Warm and dry, no rash. Neuro: Strength intact.  Assessment &  Plan   EKG changes EKG today demonstrate NSR 91 bpm, LBBB, LAFB changed from prior EKG 05/20/2022. Patient denies chest pain, pressure or tightness. She is experiencing dyspnea with less exertion than usual. She attributes this to anemia.  -Schedule echo. If normal will obtain nuclear stress test.   Mitral valve regurgitation Echo October 2023 showed normal LV/RV function, mild to moderate MR.  Patient reports increased dyspnea with less  exertion recently. She has been undergoing workup for anemia. She has also been receiving iron infusions.  -Will repeat echo.   Palpitations Patient reports palpitations described as heart racing are unchanged from previous. EKG today NSR 91 bpm.  -Continue metoprolol.  Mixed hyperlipidemia/statin intolerance Lipid panel July 2024 LDL 67, TG 181.  Patient with history of myalgias on statins.  She was unable to tolerate Zetia secondary to diarrhea.  PCP started fenofibrate in August 2024. -Continue fenofibrate.  Disposition: Echo. Return in 3 months or sooner as needed.          Signed, Etta Grandchild. Kresha Abelson, DNP, NP-C

## 2023-06-06 ENCOUNTER — Telehealth: Payer: Self-pay | Admitting: *Deleted

## 2023-06-06 NOTE — Telephone Encounter (Signed)
The patient does not know what it says and I see that not all of the labs has been finalized. She wants Dr. Alena Bills to call her at [phone]

## 2023-06-06 NOTE — Telephone Encounter (Signed)
Pt contacted. Apt moved to 215 with Dr. Mervyn Skeeters on Monday. Lab at 2pm

## 2023-06-09 ENCOUNTER — Inpatient Hospital Stay (HOSPITAL_BASED_OUTPATIENT_CLINIC_OR_DEPARTMENT_OTHER): Payer: Medicare Other | Admitting: Internal Medicine

## 2023-06-09 ENCOUNTER — Other Ambulatory Visit: Payer: Self-pay

## 2023-06-09 ENCOUNTER — Ambulatory Visit: Payer: Medicare Other | Attending: Student | Admitting: Student

## 2023-06-09 ENCOUNTER — Inpatient Hospital Stay: Payer: Medicare Other | Attending: Internal Medicine

## 2023-06-09 ENCOUNTER — Encounter: Payer: Self-pay | Admitting: Internal Medicine

## 2023-06-09 ENCOUNTER — Encounter: Payer: Self-pay | Admitting: Student

## 2023-06-09 VITALS — BP 131/73 | HR 76 | Temp 97.9°F | Resp 14 | Wt 150.0 lb

## 2023-06-09 VITALS — BP 124/60 | HR 91 | Ht <= 58 in | Wt 156.4 lb

## 2023-06-09 DIAGNOSIS — R0602 Shortness of breath: Secondary | ICD-10-CM | POA: Diagnosis not present

## 2023-06-09 DIAGNOSIS — I447 Left bundle-branch block, unspecified: Secondary | ICD-10-CM

## 2023-06-09 DIAGNOSIS — I340 Nonrheumatic mitral (valve) insufficiency: Secondary | ICD-10-CM | POA: Diagnosis not present

## 2023-06-09 DIAGNOSIS — D509 Iron deficiency anemia, unspecified: Secondary | ICD-10-CM | POA: Insufficient documentation

## 2023-06-09 DIAGNOSIS — Z90710 Acquired absence of both cervix and uterus: Secondary | ICD-10-CM | POA: Insufficient documentation

## 2023-06-09 DIAGNOSIS — Z803 Family history of malignant neoplasm of breast: Secondary | ICD-10-CM | POA: Diagnosis not present

## 2023-06-09 DIAGNOSIS — E782 Mixed hyperlipidemia: Secondary | ICD-10-CM

## 2023-06-09 DIAGNOSIS — D649 Anemia, unspecified: Secondary | ICD-10-CM

## 2023-06-09 DIAGNOSIS — R002 Palpitations: Secondary | ICD-10-CM

## 2023-06-09 DIAGNOSIS — E538 Deficiency of other specified B group vitamins: Secondary | ICD-10-CM | POA: Diagnosis not present

## 2023-06-09 DIAGNOSIS — R0609 Other forms of dyspnea: Secondary | ICD-10-CM | POA: Diagnosis not present

## 2023-06-09 DIAGNOSIS — I444 Left anterior fascicular block: Secondary | ICD-10-CM | POA: Diagnosis not present

## 2023-06-09 DIAGNOSIS — K259 Gastric ulcer, unspecified as acute or chronic, without hemorrhage or perforation: Secondary | ICD-10-CM | POA: Diagnosis not present

## 2023-06-09 LAB — CMP (CANCER CENTER ONLY)
ALT: 17 U/L (ref 0–44)
AST: 21 U/L (ref 15–41)
Albumin: 4.2 g/dL (ref 3.5–5.0)
Alkaline Phosphatase: 56 U/L (ref 38–126)
Anion gap: 12 (ref 5–15)
BUN: 29 mg/dL — ABNORMAL HIGH (ref 8–23)
CO2: 24 mmol/L (ref 22–32)
Calcium: 9.1 mg/dL (ref 8.9–10.3)
Chloride: 103 mmol/L (ref 98–111)
Creatinine: 1.03 mg/dL — ABNORMAL HIGH (ref 0.44–1.00)
GFR, Estimated: 53 mL/min — ABNORMAL LOW (ref >60)
Glucose, Bld: 137 mg/dL — ABNORMAL HIGH (ref 70–99)
Potassium: 3.9 mmol/L (ref 3.5–5.1)
Sodium: 139 mmol/L (ref 135–145)
Total Bilirubin: 0.6 mg/dL (ref 0.0–1.2)
Total Protein: 6.4 g/dL — ABNORMAL LOW (ref 6.5–8.1)

## 2023-06-09 LAB — IRON AND TIBC
Iron: 194 ug/dL — ABNORMAL HIGH (ref 28–170)
Saturation Ratios: 41 % — ABNORMAL HIGH (ref 10.4–31.8)
TIBC: 475 ug/dL — ABNORMAL HIGH (ref 250–450)
UIBC: 281 ug/dL

## 2023-06-09 LAB — FOLATE: Folate: 8.4 ng/mL (ref >5.9)

## 2023-06-09 LAB — FERRITIN: Ferritin: 48 ng/mL (ref 11–307)

## 2023-06-09 LAB — VITAMIN B12: Vitamin B-12: 1945 pg/mL — ABNORMAL HIGH (ref 180–914)

## 2023-06-09 NOTE — Patient Instructions (Signed)
 Medication Instructions:  No changes at this time.   *If you need a refill on your cardiac medications before your next appointment, please call your pharmacy*   Lab Work: None  If you have labs (blood work) drawn today and your tests are completely normal, you will receive your results only by: MyChart Message (if you have MyChart) OR A paper copy in the mail If you have any lab test that is abnormal or we need to change your treatment, we will call you to review the results.   Testing/Procedures: Your physician has requested that you have an echocardiogram. Echocardiography is a painless test that uses sound waves to create images of your heart. It provides your doctor with information about the size and shape of your heart and how well your heart's chambers and valves are working. This procedure takes approximately one hour. There are no restrictions for this procedure. Please do NOT wear cologne, perfume, aftershave, or lotions (deodorant is allowed). Please arrive 15 minutes prior to your appointment time.  Please note: We ask at that you not bring children with you during ultrasound (echo/ vascular) testing. Due to room size and safety concerns, children are not allowed in the ultrasound rooms during exams. Our front office staff cannot provide observation of children in our lobby area while testing is being conducted. An adult accompanying a patient to their appointment will only be allowed in the ultrasound room at the discretion of the ultrasound technician under special circumstances. We apologize for any inconvenience.    Follow-Up: At [facility], you and your health needs are our priority.  As part of our continuing mission to provide you with exceptional heart care, we have created designated Provider Care Teams.  These Care Teams include your primary Cardiologist (physician) and Advanced Practice Providers (APPs -  Physician Assistants and Nurse Practitioners) who  all work together to provide you with the care you need, when you need it.   Your next appointment:   3 month(s)  Provider:   Julien Nordmann, MD or Carlos Levering, NP

## 2023-06-09 NOTE — Progress Notes (Signed)
 [facility]  [phone] [phone]  ID: CARRON MCMURRY OB: Mar 03, 1936  MR#: [id number]  WNU#:[id number]  Patient Care Team: Doreene Nest, NP as PCP - General (Internal Medicine) Antonieta Iba, MD as PCP - Cardiology (Cardiology) [name], MD as Consulting Physician (Oncology)  HPI: [name] [name] is a 88 y.o. female with past medical history of hyperlipidemia, GERD, anxiety, fibromyalgia, osteoarthritis melanoma, MR was referred to hematology clinic for further management of iron deficiency anemia.   he has history of hysterectomy due to excessive bleeding in the past.  Denies any gastric bypass surgery.  She had right knee replacement in June 2023 when she required 1 unit of blood transfusion.  She was also started on oral iron which could not tolerate because of severe nausea.  She last had colonoscopy and endoscopy more than 15 years ago.  Colonoscopy from April 2024 by Dr. Tobi Bastos showed few polyps with pathology showing tubular adenoma.  No repeat advised due to age.  Upper endoscopy showed hematin in gastric antrum and 2 superficial nonbleeding gastric ulcer.  She was started on Prilosec 40 mg twice daily which she reports compliance to.    She received 2 doses of IV Ferraheme in December 2023.  Her hemoglobin was stable until June 2024 when it dropped to 6.9. s/p 1 unit PRBC. S/p capsule endoscopy on 10/25/2022 which showed minimal blood-tinged material seen at 50-minute 6-second amount but only a few seconds.  Was recommended close monitoring of iron level and hemoglobin and if it drops then would need balloon enteroscopy to rule out small underlying AVM.  Last Feraheme in September 2024.  Patient continued to have mild anemia despite correction of iron.  Discussed about continued observation.  Patient preferred to have bone marrow biopsy done for further work up.  S/p BMBx on 06/02/2023 showed mildly hypercellular bone marrow 30% on  limited clot/biopsy specimen.  Otherwise orderly trilineage hematopoiesis.  No morphologic evidence of dysplasia.  Cytogenetics pending.   Interval history Patient seen today accompanied with daughter to discuss the bone marrow biopsy results. Did not like going through the procedure because she had to lie down on the belly.  Continues to have shortness of breath.  Saw her cardiologist who recommended to repeat echocardiogram.  She is scheduled for consultation with pulmonary.  Otherwise has been feeling okay.  REVIEW OF SYSTEMS:   Review of Systems  Respiratory:  Positive for shortness of breath.     As per HPI. Otherwise, a complete review of systems is negative.  PAST MEDICAL HISTORY: Past Medical History:  Diagnosis Date   Anxiety    prev on prozac   Chest pain    Felt to be noncardiac in the past   Chronic rectal fissure    Colon polyp    Dyslipidemia    Fibromyalgia    GERD (gastroesophageal reflux disease)    Barrett's esophagus   Hiatal hernia    Lumbar spine pain    Complex lumbar spine surgery at [facility], 2012, complicated, MRSA, much of the appliance is removed, patient on chronic antibiotics   Melanoma (HCC)    Mitral regurgitation    a. 2007 Echo Mild MR; b. 10/2016 Echo: EF 55-60%, no rwma, GrI DD, Mild MR.   Nausea and vomiting 08/22/2021   Osteoarthritis    Scoliosis    Sinusitis    Multiple sinus operations in the past   Statin intolerance    As of 2009, no further attempts to  use statins   Thyroid nodule     PAST SURGICAL HISTORY: Past Surgical History:  Procedure Laterality Date   ABDOMINAL HYSTERECTOMY     BACK SURGERY     CHOLECYSTECTOMY     COLONOSCOPY WITH PROPOFOL N/A 08/07/2022   Procedure: COLONOSCOPY WITH PROPOFOL;  Surgeon: [name], MD;  Location: [facility] ENDOSCOPY;  Service: Gastroenterology;  Laterality: N/A;   ESOPHAGOGASTRODUODENOSCOPY (EGD) WITH PROPOFOL N/A 08/07/2022   Procedure: ESOPHAGOGASTRODUODENOSCOPY (EGD) WITH PROPOFOL;   Surgeon: [name], MD;  Location: [facility] ENDOSCOPY;  Service: Gastroenterology;  Laterality: N/A;   FOOT SURGERY     GIVENS CAPSULE STUDY N/A 10/09/2022   Procedure: GIVENS CAPSULE STUDY;  Surgeon: [name], MD;  Location: [facility] ENDOSCOPY;  Service: Gastroenterology;  Laterality: N/A;   HERNIA REPAIR     IR BONE MARROW BIOPSY & ASPIRATION  06/02/2023   KNEE SURGERY Right 08/08/2021   total knee replacement   NASAL SINUS SURGERY      FAMILY HISTORY: Family History  Problem Relation Age of Onset   GER disease Mother    Dementia Mother    Breast cancer Mother 69   Heart disease Father        MI at 35   Scoliosis Daughter    Colon cancer Neg Hx     HEALTH MAINTENANCE: Social History   Tobacco Use   Smoking status: Never    Passive exposure: Never   Smokeless tobacco: Never  Vaping Use   Vaping status: Never Used  Substance Use Topics   Alcohol use: No   Drug use: No     Allergies  Allergen Reactions   Other Other (See Comments)    Other Reaction: when taking for an extended period of time causes mouth ulcers and esophagus irritation Other Reaction: when taking for an extended period of time causes mouth ulcers and esophagus irritation    Fenofibric Acid Other (See Comments)    GI upset   Nsaids     REACTION: No long term use   Statins Other (See Comments)    Intolerant, myalgias Intolerant, myalgias Intolerant, myalgias Intolerant, myalgias Intolerant, myalgias Intolerant, myalgias    Trilipix [Choline Fenofibrate]     GI upset   Wasp Venom Swelling    Current Outpatient Medications  Medication Sig Dispense Refill   ALPHA LIPOIC ACID PO Take 1 tablet by mouth daily. Daily     amitriptyline (ELAVIL) 10 MG tablet TAKE 1 TABLET (10 MG TOTAL) BY MOUTH AT BEDTIME FOR HEADACHE PREVENTION 90 tablet 2   calcium carbonate (OS-CAL) 600 MG TABS Take 1,200 mg by mouth 2 (two) times daily with a meal.     cholecalciferol (VITAMIN D) 1000 UNITS tablet Take 1,000 Units  by mouth daily.     ciclopirox (LOPROX) 0.77 % cream Apply 1 g/mL topically 2 (two) times daily.     clotrimazole-betamethasone (LOTRISONE) cream APPLY TOPICALLY 2 (TWO) TIMES DAILY AS NEEDED. 30 g 0   diclofenac sodium (VOLTAREN) 1 % GEL Apply 2-4 grams to affected area up to 4 times daily 4 Tube 2   fenofibrate micronized (LOFIBRA) 134 MG capsule TAKE 1 CAPSULE (134 MG TOTAL) BY MOUTH AT BEDTIME FOR CHOLESTEROL. 90 capsule 2   fish oil-omega-3 fatty acids 1000 MG capsule Take 2 g by mouth 2 (two) times daily.     gabapentin (NEURONTIN) 100 MG capsule TAKE 1 CAPSULE BY MOUTH THREE TIMES A DAY 270 capsule 0   Ginger, Zingiber officinalis, (GINGER PO) Take 1 tablet by mouth  daily.     levothyroxine (SYNTHROID) 50 MCG tablet TAKE 1 TABLET BY MOUTH EVERY MORNING WITH WATER ON AN EMPTY STOMACH. NO OTHER FOODS/MEDS FOR 30 MINS 90 tablet 2   metoprolol tartrate (LOPRESSOR) 25 MG tablet TAKE 1 TAB BY MOUTH TWICE A DAY AND EXTRA TABLET AS NEEDED FOR BREAKTHROUGH PALPITATIONS AS DIRECTED 180 tablet 0   Multiple Vitamins-Minerals (MULTIVITAMIN WITH MINERALS) tablet Take 1 tablet by mouth daily.     NON FORMULARY Take 1 capsule by mouth daily. Tumeric      Daily     omeprazole (PRILOSEC) 20 MG capsule TAKE 2 CAPS (40 MG TOTAL) BY MOUTH IN THE MORNING AND AT BEDTIME. 360 capsule 0   sulfamethoxazole-trimethoprim (BACTRIM DS) 800-160 MG tablet TAKE 1 TABLET (160 MG OF TRIMETHOPRIM TOTAL) BY MOUTH 2 (TWO) TIMES DAILY 180 tablet 3   tretinoin (RETIN-A) 0.05 % cream Apply 1 application topically daily as needed (as directed per dermatologist).   3   No current facility-administered medications for this visit.    OBJECTIVE: Vitals:   06/09/23 1428  BP: 131/73  Pulse: 76  Resp: 14  Temp: 97.9 F (36.6 C)  SpO2: 96%     Body mass index is 33.63 kg/m.      General: Well-developed, well-nourished, no acute distress. Eyes: Pink conjunctiva, anicteric sclera. HEENT: Normocephalic, moist mucous membranes,  clear oropharnyx. Lungs: Clear to auscultation bilaterally. Heart: Regular rate and rhythm. No rubs, murmurs, or gallops. Abdomen: Soft, nontender, nondistended. No organomegaly noted, normoactive bowel sounds. Musculoskeletal: No edema, cyanosis, or clubbing. Neuro: Alert, answering all questions appropriately. Cranial nerves grossly intact. Skin: No rashes or petechiae noted. Psych: Normal affect. Lymphatics: No cervical, calvicular, axillary or inguinal LAD.   LAB RESULTS:  Lab Results  Component Value Date   NA 139 06/09/2023   K 3.9 06/09/2023   CL 103 06/09/2023   CO2 24 06/09/2023   GLUCOSE 137 (H) 06/09/2023   BUN 29 (H) 06/09/2023   CREATININE 1.03 (H) 06/09/2023   CALCIUM 9.1 06/09/2023   PROT 6.4 (L) 06/09/2023   ALBUMIN 4.2 06/09/2023   AST 21 06/09/2023   ALT 17 06/09/2023   ALKPHOS 56 06/09/2023   BILITOT 0.6 06/09/2023   GFRNONAA 53 (L) 06/09/2023    Lab Results  Component Value Date   WBC 4.5 06/02/2023   NEUTROABS 2.6 06/02/2023   HGB 11.6 (L) 06/02/2023   HCT 36.4 06/02/2023   MCV 97.3 06/02/2023   PLT 221 06/02/2023    Lab Results  Component Value Date   TIBC 475 (H) 06/09/2023   TIBC 503 (H) 04/21/2023   TIBC 462 (H) 02/07/2023   FERRITIN 48 06/09/2023   FERRITIN 66 04/21/2023   FERRITIN 236 02/07/2023   IRONPCTSAT 41 (H) 06/09/2023   IRONPCTSAT 11 04/21/2023   IRONPCTSAT 17 02/07/2023     STUDIES: MM 3D SCREENING MAMMOGRAM BILATERAL BREAST Result Date: 06/04/2023 CLINICAL DATA:  Screening. EXAM: DIGITAL SCREENING BILATERAL MAMMOGRAM WITH TOMOSYNTHESIS AND CAD TECHNIQUE: Bilateral screening digital craniocaudal and mediolateral oblique mammograms were obtained. Bilateral screening digital breast tomosynthesis was performed. The images were evaluated with computer-aided detection. COMPARISON:  Previous exam(s). ACR Breast Density Category b: There are scattered areas of fibroglandular density. FINDINGS: There are no findings suspicious for  malignancy. IMPRESSION: No mammographic evidence of malignancy. A result letter of this screening mammogram will be mailed directly to the patient. RECOMMENDATION: Screening mammogram in one year. (Code:SM-B-01Y) BI-RADS CATEGORY  1: Negative. Electronically Signed   By: Baird Lyons  M.D.   On: 06/04/2023 10:55   IR BONE MARROW BIOPSY & ASPIRATION Result Date: 06/02/2023 INDICATION: anemia EXAM: FLUOROSCOPIC GUIDED BONE MARROW BIOPSY AND ASPIRATION MEDICATIONS: None FLUOROSCOPY TIME:  Fluoroscopic dose; 9.6 mGy ANESTHESIA/SEDATION: Moderate (conscious) sedation was employed during this procedure. A total of Versed 0.5 mg and Fentanyl 25 mcg was administered intravenously. Moderate Sedation Time: 10 minutes. The patient's level of consciousness and vital signs were monitored continuously by radiology nursing throughout the procedure under my direct supervision. COMPLICATIONS: None immediate. PROCEDURE: Informed consent was obtained from the patient following an explanation of the procedure, risks, benefits and alternatives. The patient understands, agrees and consents for the procedure. All questions were addressed. A time out was performed prior to the initiation of the procedure. The patient was positioned prone on the fluoroscopy table and the posterior aspect of the LEFT iliac crest was marked fluoroscopically. The operative site was prepped and draped in the usual sterile fashion. Under sterile conditions and local anesthesia, an 11 gauge coaxial bone biopsy needle was advanced into the posterior aspect of the LEFT iliac marrow space under intermittent fluoroscopic guidance. Multiple fluoroscopic images were saved procedural documentation purposes. Initially, a bone marrow aspiration was performed. Next, a bone marrow biopsy was obtained with the 11 gauge outer bone marrow device. The needle was removed and superficial hemostasis was obtained with manual compression. A dressing was applied. The patient  tolerated the procedure well without immediate post procedural complication. IMPRESSION: Successful fluoroscopic guided LEFT iliac bone marrow aspiration and core biopsy. Roanna Banning, MD [facility]iology Electronically Signed   By: Roanna Banning M.D.   On: 06/02/2023 10:38    ASSESSMENT AND PLAN:   [name] [name] is a 88 y.o. female with pmh of hyperlipidemia, GERD, anxiety, fibromyalgia, osteoarthritis melanoma, MR was referred to hematology clinic for further management of iron deficiency anemia.  #Iron deficiency anemia -Of unclear source.  Could not tolerate oral iron due to nausea.  Colonoscopy from April 2024 by Dr. Tobi Bastos showed few polyps with pathology showing tubular adenoma.  No repeat advised due to age.  Upper endoscopy showed hematin in gastric antrum and 2 superficial nonbleeding gastric ulcer.  She was started on Prilosec 40 mg twice daily which she reports compliance to.    -Hemoglobin decreased to 6.9.  S/p 1 unit PRBC on 09/20/2022. s/p capsule endoscopy on 10/25/2022 which showed minimal blood-tinged material seen at 50-minute 6-second amount but only a few seconds.  Was recommended close monitoring of iron level and hemoglobin and if it drops then would need balloon enteroscopy to rule out small underlying AVM.    -Had mild anemia despite improvement in iron levels. No chronic kidney disease. Discussed about observation (as it may not significantly change the management) versus bone marrow biopsy.  She decided to proceed with the latter.  - S/p BMBx on 06/02/2023 showed mildly hypercellular bone marrow 30% on limited clot/biopsy specimen.  Otherwise orderly trilineage hematopoiesis.  No morphologic evidence of dysplasia.  Cytogenetics pending.  - Consider gentle iron 3 times a week.   # Vitamin B12 deficiency -S/p IM cyanocobalamin 1000 mcg X 4 -Continue with B12 1000 mcg oral daily -B12 level pending from today   # Shortness of  breath on exertion -Discussed with the patient that it is unlikely to be related to her mild anemia. -Has consultation scheduled with Pulmonary next month.  - continue f/u with Cardiology. Planned for Echo.   Orders Placed This Encounter  Procedures   CMP (Cancer Center only)   CBC with Differential (Cancer Center Only)   Ferritin   Iron and TIBC([facility])   Vitamin B1   Vitamin B12   RTC in about 3 months for MD visit, labs.  Patient expressed understanding and was in agreement with this plan. She also understands that She can call clinic at any time with any questions, concerns, or complaints.   I spent a total of 30 minutes reviewing chart data, face-to-face evaluation with the patient, counseling and coordination of care as detailed above.  [name], MD   06/09/2023 4:06 PM

## 2023-06-09 NOTE — Progress Notes (Signed)
 Patient had a bone marrow and aspiration on 06/02/2023.  She is still having shortness of breath. She went and seen her cardiologist today, and they are wanting to have an echo cardiogram, which they have her scheduled in March to have it done.

## 2023-06-09 NOTE — Patient Instructions (Signed)
 Please consider 'Slow Fe' over the counter. You can take 3 times a week.

## 2023-06-10 ENCOUNTER — Encounter (HOSPITAL_COMMUNITY): Payer: Self-pay | Admitting: Internal Medicine

## 2023-06-19 ENCOUNTER — Encounter (HOSPITAL_COMMUNITY): Payer: Self-pay | Admitting: Internal Medicine

## 2023-06-20 ENCOUNTER — Ambulatory Visit: Payer: Medicare Other | Admitting: Internal Medicine

## 2023-06-20 ENCOUNTER — Other Ambulatory Visit: Payer: Medicare Other

## 2023-06-25 DIAGNOSIS — K080 Exfoliation of teeth due to systemic causes: Secondary | ICD-10-CM | POA: Diagnosis not present

## 2023-07-01 ENCOUNTER — Ambulatory Visit: Payer: Medicare Other | Attending: Student

## 2023-07-02 ENCOUNTER — Other Ambulatory Visit
Admission: RE | Admit: 2023-07-02 | Discharge: 2023-07-02 | Disposition: A | Discharge status: Home / Self Care | Source: Ambulatory Visit | Attending: Student in an Organized Health Care Education/Training Program | Admitting: Student in an Organized Health Care Education/Training Program

## 2023-07-02 ENCOUNTER — Encounter: Payer: Self-pay | Admitting: Student in an Organized Health Care Education/Training Program

## 2023-07-02 ENCOUNTER — Ambulatory Visit: Payer: Medicare Other | Admitting: Student in an Organized Health Care Education/Training Program

## 2023-07-02 VITALS — BP 124/70 | HR 68 | Temp 97.6°F | Ht <= 58 in | Wt 156.0 lb

## 2023-07-02 DIAGNOSIS — R0602 Shortness of breath: Secondary | ICD-10-CM

## 2023-07-02 LAB — CBC WITH DIFFERENTIAL/PLATELET
Abs Immature Granulocytes: 0.01 10*3/uL (ref 0.00–0.07)
Basophils Absolute: 0 10*3/uL (ref 0.0–0.1)
Basophils Relative: 1 %
Eosinophils Absolute: 0.1 10*3/uL (ref 0.0–0.5)
Eosinophils Relative: 1 %
HCT: 34.2 % — ABNORMAL LOW (ref 36.0–46.0)
Hemoglobin: 11.1 g/dL — ABNORMAL LOW (ref 12.0–15.0)
Immature Granulocytes: 0 %
Lymphocytes Relative: 27 %
Lymphs Abs: 1.1 10*3/uL (ref 0.7–4.0)
MCH: 31 pg (ref 26.0–34.0)
MCHC: 32.5 g/dL (ref 30.0–36.0)
MCV: 95.5 fL (ref 80.0–100.0)
Monocytes Absolute: 0.4 10*3/uL (ref 0.1–1.0)
Monocytes Relative: 10 %
Neutro Abs: 2.6 10*3/uL (ref 1.7–7.7)
Neutrophils Relative %: 61 %
Platelets: 191 10*3/uL (ref 150–400)
RBC: 3.58 MIL/uL — ABNORMAL LOW (ref 3.87–5.11)
RDW: 13.6 % (ref 11.5–15.5)
WBC: 4.2 10*3/uL (ref 4.0–10.5)
nRBC: 0 % (ref 0.0–0.2)

## 2023-07-02 LAB — D-DIMER, QUANTITATIVE: D-Dimer, Quant: 0.37 ug{FEU}/mL (ref 0.00–0.50)

## 2023-07-02 NOTE — Progress Notes (Signed)
 Synopsis: Referred in for shortness of breath by [name] Barter, MD  Assessment & Plan:   1. Shortness of breath (Primary)  She is presenting for the evaluation of shortness of breath in the setting of previous blood loss anemia as well as a previous history of venous thromboembolism. Physical exam today is unremarkable.  The differential diagnosis for her symptoms includes pulmonary hypertension, CHF, worsening mitral regurgitation, acute pulmonary embolism, restrictive lung disease and acute blood loss anemia. Previous CT/PE did not show signs of ILD and it is unlikely that this would develop this quickly with a normal exam.  For workup, I will obtain a CBC to assess her hemoglobin levels, D-dimer to assess for risk of venous embolism, and pulmonary function testing with spirometry, lung volumes, and DLCO.  Should her D-dimer be elevated, I will follow this with contrasted CT to assess for VTE.  Should that be negative and DLCO be decreased, will consider perfusion scans to rule out CTEPH pending the findings from her upcoming echocardiogram.  - CBC w/Diff; Future - D-Dimer, Quantitative; Future - Pulmonary Function Test [facility] Only; Future   Return in about 2 months (around 09/01/2023).  I spent 60 minutes caring for this patient today, including preparing to see the patient, obtaining a medical history , reviewing a separately obtained history, performing a medically appropriate examination and/or evaluation, counseling and educating the patient/family/caregiver, ordering medications, tests, or procedures, and documenting clinical information in the electronic health record  [name] Chute, MD [facility]  End of visit medications:  No orders of the defined types were placed in this encounter.    Current Outpatient Medications:    ALPHA LIPOIC ACID PO, Take 1 tablet by mouth daily. Daily, Disp: , Rfl:    amitriptyline (ELAVIL) 10 MG tablet, TAKE 1 TABLET (10  MG TOTAL) BY MOUTH AT BEDTIME FOR HEADACHE PREVENTION, Disp: 90 tablet, Rfl: 2   calcium carbonate (OS-CAL) 600 MG TABS, Take 1,200 mg by mouth 2 (two) times daily with a meal., Disp: , Rfl:    cholecalciferol (VITAMIN D) 1000 UNITS tablet, Take 1,000 Units by mouth daily., Disp: , Rfl:    ciclopirox (LOPROX) 0.77 % cream, Apply 1 g/mL topically 2 (two) times daily., Disp: , Rfl:    clotrimazole-betamethasone (LOTRISONE) cream, APPLY TOPICALLY 2 (TWO) TIMES DAILY AS NEEDED., Disp: 30 g, Rfl: 0   diclofenac sodium (VOLTAREN) 1 % GEL, Apply 2-4 grams to affected area up to 4 times daily, Disp: 4 Tube, Rfl: 2   fenofibrate micronized (LOFIBRA) 134 MG capsule, TAKE 1 CAPSULE (134 MG TOTAL) BY MOUTH AT BEDTIME FOR CHOLESTEROL., Disp: 90 capsule, Rfl: 2   fish oil-omega-3 fatty acids 1000 MG capsule, Take 2 g by mouth 2 (two) times daily., Disp: , Rfl:    gabapentin (NEURONTIN) 100 MG capsule, TAKE 1 CAPSULE BY MOUTH THREE TIMES A DAY, Disp: 270 capsule, Rfl: 0   Ginger, Zingiber officinalis, (GINGER PO), Take 1 tablet by mouth daily., Disp: , Rfl:    levothyroxine (SYNTHROID) 50 MCG tablet, TAKE 1 TABLET BY MOUTH EVERY MORNING WITH WATER ON AN EMPTY STOMACH. NO OTHER FOODS/MEDS FOR 30 MINS, Disp: 90 tablet, Rfl: 2   metoprolol tartrate (LOPRESSOR) 25 MG tablet, TAKE 1 TAB BY MOUTH TWICE A DAY AND EXTRA TABLET AS NEEDED FOR BREAKTHROUGH PALPITATIONS AS DIRECTED, Disp: 180 tablet, Rfl: 0   Multiple Vitamins-Minerals (MULTIVITAMIN WITH MINERALS) tablet, Take 1 tablet by mouth daily., Disp: , Rfl:    NON FORMULARY, Take  1 capsule by mouth daily. Tumeric      Daily, Disp: , Rfl:    sulfamethoxazole-trimethoprim (BACTRIM DS) 800-160 MG tablet, TAKE 1 TABLET (160 MG OF TRIMETHOPRIM TOTAL) BY MOUTH 2 (TWO) TIMES DAILY, Disp: 180 tablet, Rfl: 3   tretinoin (RETIN-A) 0.05 % cream, Apply 1 application topically daily as needed (as directed per dermatologist). , Disp: , Rfl: 3   omeprazole (PRILOSEC) 20 MG capsule,  TAKE 2 CAPS (40 MG TOTAL) BY MOUTH IN THE MORNING AND AT BEDTIME., Disp: 360 capsule, Rfl: 0   Subjective:   PATIENT ID: [name] [name] GENDER: female DOB: 1935/07/27, MRN: [id number]  Chief Complaint  Patient presents with   Consult    Shortness of breath on exertion. No cough or wheezing.     HPI  Patient is a pleasant 88 year old female with a past medical history of blood loss anemia and previous PE who presents to clinic for the evaluation of shortness of breath.  Patient reports symptoms of exertional dyspnea for the past few months.  Her husband was accompanying her also noticed that she is very short of breath with minimal exertion. She has felt no other respiratory symptoms and denies any cough, sputum production, wheezing, hemoptysis, chest tightness, fevers, or chills.  Patient has been followed closely by hematology for blood loss anemia with GI workup that was notable for gastric ulcers and possible AV malformations.  She was previously receiving iron infusions.  Despite improvement in her hemoglobin levels, her symptoms persisted and progressed prompting referral back to cardiology as well as to pulmonology for an evaluation.  Patient was recently seen by cardiology in February and a repeat echocardiogram was ordered.  Previous echocardiograms had shown normal right and left ventricular function though mitral valve was noted to have moderate amount of regurgitation.  Furthermore, patient reports history of venous thromboembolism after a knee replacement surgery.  She was seen at an outside hospital and a CT scan of the chest with PE protocol performed in April 2023 was notable for acute pulmonary emboli in the segmental branches of the bilateral upper lobes.  She was treated with apixaban for 3 months after which it was discontinued.  Other notable medical problems include osteoarthritis and fibromyalgia followed by rheumatology.  Patient denies any history of smoking and denies any  occupational exposures.  Ancillary information including prior medications, full medical/surgical/family/social histories, and PFTs (when available) are listed below and have been reviewed.   Review of Systems  Constitutional:  Negative for chills and fever.  Respiratory:  Positive for shortness of breath. Negative for cough, hemoptysis, sputum production and wheezing.   Cardiovascular:  Negative for chest pain.     Objective:   Vitals:   07/02/23 1311  BP: 124/70  Pulse: 68  Temp: 97.6 F (36.4 C)  TempSrc: Temporal  SpO2: 96%  Weight: 156 lb (70.8 kg)  Height: 4\' 8"  (1.422 m)   96% on RA  BMI Readings from Last 3 Encounters:  07/02/23 34.97 kg/m  06/09/23 33.63 kg/m  06/09/23 35.06 kg/m   Wt Readings from Last 3 Encounters:  07/02/23 156 lb (70.8 kg)  06/09/23 150 lb (68 kg)  06/09/23 156 lb 6.4 oz (70.9 kg)    Physical Exam Constitutional:      Appearance: Normal appearance.  Cardiovascular:     Rate and Rhythm: Normal rate and regular rhythm.     Pulses: Normal pulses.     Heart sounds: Normal heart sounds.  Pulmonary:  Effort: Pulmonary effort is normal.     Breath sounds: Normal breath sounds.  Neurological:     General: No focal deficit present.     Mental Status: She is alert and oriented to person, place, and time. Mental status is at baseline.       Ancillary Information    Past Medical History:  Diagnosis Date   Anxiety    prev on prozac   Chest pain    Felt to be noncardiac in the past   Chronic rectal fissure    Colon polyp    Dyslipidemia    Fibromyalgia    GERD (gastroesophageal reflux disease)    Barrett's esophagus   Hiatal hernia    Lumbar spine pain    Complex lumbar spine surgery at [facility], 2012, complicated, MRSA, much of the appliance is removed, patient on chronic antibiotics   Melanoma (HCC)    Mitral regurgitation    a. 2007 Echo Mild MR; b. 10/2016 Echo: EF 55-60%, no rwma, GrI DD, Mild MR.   Nausea and vomiting  08/22/2021   Osteoarthritis    Scoliosis    Sinusitis    Multiple sinus operations in the past   Statin intolerance    As of 2009, no further attempts to use statins   Thyroid nodule      Family History  Problem Relation Age of Onset   GER disease Mother    Dementia Mother    Breast cancer Mother 79   Heart disease Father        MI at 42   Scoliosis Daughter    Colon cancer Neg Hx      Past Surgical History:  Procedure Laterality Date   ABDOMINAL HYSTERECTOMY     BACK SURGERY     CHOLECYSTECTOMY     COLONOSCOPY WITH PROPOFOL N/A 08/07/2022   Procedure: COLONOSCOPY WITH PROPOFOL;  Surgeon: [name], MD;  Location: [facility] ENDOSCOPY;  Service: Gastroenterology;  Laterality: N/A;   ESOPHAGOGASTRODUODENOSCOPY (EGD) WITH PROPOFOL N/A 08/07/2022   Procedure: ESOPHAGOGASTRODUODENOSCOPY (EGD) WITH PROPOFOL;  Surgeon: [name], MD;  Location: [facility] ENDOSCOPY;  Service: Gastroenterology;  Laterality: N/A;   FOOT SURGERY     GIVENS CAPSULE STUDY N/A 10/09/2022   Procedure: GIVENS CAPSULE STUDY;  Surgeon: [name], MD;  Location: [facility] END[facility]COPY;  Service: Gastroenterology;  Laterality: N/A;   HERNIA REPAIR     IR BONE MARROW BIOPSY & ASPIRATION  06/02/2023   KNEE SURGERY Right 08/08/2021   total knee replacement   NASAL SINUS SURGERY      Social History   Socioeconomic History   Marital status: Married    Spouse name: Not on file   Number of children: Not on file   Years of education: Not on file   Highest education level: Not on file  Occupational History   Not on file  Tobacco Use   Smoking status: Never    Passive exposure: Never   Smokeless tobacco: Never  Vaping Use   Vaping status: Never Used  Substance and Sexual Activity   Alcohol use: No   Drug use: No   Sexual activity: Not on file  Other Topics Concern   Not on file  Social History Narrative   From [place]   Married (second [phone]   3 kids, 2 of whom are local   Retired, Tree surgeon    Social Drivers of Health   Financial Resource Strain: Low Risk  (10/07/2022)   Overall Financial Resource Strain (CARDIA)  Difficulty of Paying Living Expenses: Not hard at all  Food Insecurity: No Food Insecurity (10/07/2022)   Hunger Vital Sign    Worried About Running Out of Food in the Last Year: Never true    Ran Out of Food in the Last Year: Never true  Transportation Needs: No Transportation Needs (10/07/2022)   PRAPARE - Administrator, Civil Service (Medical): No    Lack of Transportation (Non-Medical): No  Physical Activity: Sufficiently Active (10/07/2022)   Exercise Vital Sign    Days of Exercise per Week: 3 days    Minutes of Exercise per Session: 50 min  Stress: No Stress Concern Present (10/07/2022)   [employer] of Occupational Health - Occupational Stress Questionnaire    Feeling of Stress : Not at all  Social Connections: Moderately Isolated (10/07/2022)   Social Connection and Isolation Panel [NHANES]    Frequency of Communication with Friends and Family: More than three times a week    Frequency of Social Gatherings with Friends and Family: More than three times a week    Attends Religious Services: Never    Database administrator or Organizations: No    Attends Banker Meetings: Never    Marital Status: Married  Catering manager Violence: Not At Risk (10/07/2022)   Humiliation, Afraid, Rape, and Kick questionnaire    Fear of Current or Ex-Partner: No    Emotionally Abused: No    Physically Abused: No    Sexually Abused: No     Allergies  Allergen Reactions   Other Other (See Comments)    Other Reaction: when taking for an extended period of time causes mouth ulcers and esophagus irritation Other Reaction: when taking for an extended period of time causes mouth ulcers and esophagus irritation    Fenofibric Acid Other (See Comments)    GI upset   Nsaids     REACTION: No long term use   Statins Other (See Comments)     Intolerant, myalgias Intolerant, myalgias Intolerant, myalgias Intolerant, myalgias Intolerant, myalgias Intolerant, myalgias    Trilipix [Choline Fenofibrate]     GI upset   Wasp Venom Swelling     CBC    Component Value Date/Time   WBC 4.5 06/02/2023 0938   RBC 3.74 (L) 06/02/2023 0938   HGB 11.6 (L) 06/02/2023 0938   HGB 9.3 (L) 02/07/2023 1320   HCT 36.4 06/02/2023 0938   PLT 221 06/02/2023 0938   PLT 212 02/07/2023 1320   MCV 97.3 06/02/2023 0938   MCH 31.0 06/02/2023 0938   MCHC 31.9 06/02/2023 0938   RDW 13.5 06/02/2023 0938   LYMPHSABS 1.3 06/02/2023 0938   MONOABS 0.4 06/02/2023 0938   EOSABS 0.1 06/02/2023 0938   BASOSABS 0.0 06/02/2023 4098    Pulmonary Functions Testing Results:     No data to display          Outpatient Medications Prior to Visit  Medication Sig Dispense Refill   ALPHA LIPOIC ACID PO Take 1 tablet by mouth daily. Daily     amitriptyline (ELAVIL) 10 MG tablet TAKE 1 TABLET (10 MG TOTAL) BY MOUTH AT BEDTIME FOR HEADACHE PREVENTION 90 tablet 2   calcium carbonate (OS-CAL) 600 MG TABS Take 1,200 mg by mouth 2 (two) times daily with a meal.     cholecalciferol (VITAMIN D) 1000 UNITS tablet Take 1,000 Units by mouth daily.     ciclopirox (LOPROX) 0.77 % cream Apply 1 g/mL topically 2 (two) times daily.  clotrimazole-betamethasone (LOTRISONE) cream APPLY TOPICALLY 2 (TWO) TIMES DAILY AS NEEDED. 30 g 0   diclofenac sodium (VOLTAREN) 1 % GEL Apply 2-4 grams to affected area up to 4 times daily 4 Tube 2   fenofibrate micronized (LOFIBRA) 134 MG capsule TAKE 1 CAPSULE (134 MG TOTAL) BY MOUTH AT BEDTIME FOR CHOLESTEROL. 90 capsule 2   fish oil-omega-3 fatty acids 1000 MG capsule Take 2 g by mouth 2 (two) times daily.     gabapentin (NEURONTIN) 100 MG capsule TAKE 1 CAPSULE BY MOUTH THREE TIMES A DAY 270 capsule 0   Ginger, Zingiber officinalis, (GINGER PO) Take 1 tablet by mouth daily.     levothyroxine (SYNTHROID) 50 MCG tablet TAKE 1 TABLET  BY MOUTH EVERY MORNING WITH WATER ON AN EMPTY STOMACH. NO OTHER FOODS/MEDS FOR 30 MINS 90 tablet 2   metoprolol tartrate (LOPRESSOR) 25 MG tablet TAKE 1 TAB BY MOUTH TWICE A DAY AND EXTRA TABLET AS NEEDED FOR BREAKTHROUGH PALPITATIONS AS DIRECTED 180 tablet 0   Multiple Vitamins-Minerals (MULTIVITAMIN WITH MINERALS) tablet Take 1 tablet by mouth daily.     NON FORMULARY Take 1 capsule by mouth daily. Tumeric      Daily     sulfamethoxazole-trimethoprim (BACTRIM DS) 800-160 MG tablet TAKE 1 TABLET (160 MG OF TRIMETHOPRIM TOTAL) BY MOUTH 2 (TWO) TIMES DAILY 180 tablet 3   tretinoin (RETIN-A) 0.05 % cream Apply 1 application topically daily as needed (as directed per dermatologist).   3   omeprazole (PRILOSEC) 20 MG capsule TAKE 2 CAPS (40 MG TOTAL) BY MOUTH IN THE MORNING AND AT BEDTIME. 360 capsule 0   No facility-administered medications prior to visit.

## 2023-07-09 ENCOUNTER — Other Ambulatory Visit: Payer: Self-pay | Admitting: Primary Care

## 2023-07-09 ENCOUNTER — Other Ambulatory Visit: Payer: Self-pay | Admitting: Cardiovascular Disease

## 2023-07-09 ENCOUNTER — Other Ambulatory Visit: Payer: Self-pay | Admitting: Orthopaedic Surgery

## 2023-07-09 DIAGNOSIS — E039 Hypothyroidism, unspecified: Secondary | ICD-10-CM

## 2023-07-09 DIAGNOSIS — E785 Hyperlipidemia, unspecified: Secondary | ICD-10-CM

## 2023-07-09 DIAGNOSIS — R519 Headache, unspecified: Secondary | ICD-10-CM

## 2023-07-10 NOTE — Telephone Encounter (Signed)
 Spoke with pt, scheduled cpe for 10/28/23

## 2023-07-10 NOTE — Telephone Encounter (Signed)
Patient is due for CPE/follow up in mid July, this will be required prior to any further refills.  Please schedule, thank you!   

## 2023-07-19 ENCOUNTER — Other Ambulatory Visit: Payer: Self-pay | Admitting: Primary Care

## 2023-07-19 DIAGNOSIS — E039 Hypothyroidism, unspecified: Secondary | ICD-10-CM

## 2023-07-19 DIAGNOSIS — E785 Hyperlipidemia, unspecified: Secondary | ICD-10-CM

## 2023-07-28 ENCOUNTER — Ambulatory Visit: Attending: Student

## 2023-07-28 DIAGNOSIS — R0609 Other forms of dyspnea: Secondary | ICD-10-CM | POA: Diagnosis not present

## 2023-07-29 LAB — ECHOCARDIOGRAM COMPLETE
AV Mean grad: 4 mmHg
AV Peak grad: 7.1 mmHg
Ao pk vel: 1.33 m/s
Area-P 1/2: 3.6 cm2

## 2023-08-01 ENCOUNTER — Ambulatory Visit: Payer: Self-pay

## 2023-08-01 NOTE — Telephone Encounter (Signed)
 Copied from CRM [id number]. Topic: Clinical - Red Word Triage >> Aug 01, 2023  7:48 AM [name] wrote: Red Word that prompted transfer to Nurse Triage: patient husband Norleen calling in patient fell around 6pm took to Urgent care, who said to call pcp to get Xray Right hand, last 2 fingers are black,    Chief Complaint: Hand injury, fell at home. Seen in UC. Symptoms: Pain, bruising Frequency: Yesterday Pertinent Negatives: Patient denies  Disposition: [] ED /[] Urgent Care (no appt availability in office) / [x] Appointment(In office/virtual)/ []  [facility]/ [] Home Care/ [] Refused Recommended Disposition /[] [facility]/ []  Follow-up with PCP Additional Notes: Agrees with appointment.   Reason for Disposition  Can't use injured hand normally (e.g., make a fist, open fully, hold a glass of water)  Answer Assessment - Initial Assessment Questions 1. MECHANISM: How did the injury happen?     Fell 2. ONSET: When did the injury happen? (Minutes or hours ago)      Yesterday 3. APPEARANCE of INJURY: What does the injury look like?      Bruised 4. SEVERITY: Can you use the hand normally? Can you bend your fingers into a ball and then fully open them?     Yes 5. SIZE: For cuts, bruises, or swelling, ask: How large is it? (e.g., inches or centimeters;  entire hand or wrist)      No 6. PAIN: Is there pain? If Yes, ask: How bad is the pain?  (Scale 1-10; or mild, moderate, severe)     Mild 7. TETANUS: For any breaks in the skin, ask: When was the last tetanus booster?     N/a 8. OTHER SYMPTOMS: Do you have any other symptoms?      no 9. PREGNANCY: Is there any chance you are pregnant? When was your last menstrual period?     no  Protocols used: Hand and Wrist Injury-A-AH

## 2023-08-04 NOTE — Telephone Encounter (Signed)
 Patient is scheduled to see PCP on 08/05/23.

## 2023-08-05 ENCOUNTER — Ambulatory Visit (HOSPITAL_BASED_OUTPATIENT_CLINIC_OR_DEPARTMENT_OTHER): Admitting: Student

## 2023-08-05 ENCOUNTER — Telehealth: Payer: Self-pay

## 2023-08-05 ENCOUNTER — Encounter: Payer: Self-pay | Admitting: Emergency Medicine

## 2023-08-05 ENCOUNTER — Ambulatory Visit: Admitting: General Practice

## 2023-08-05 ENCOUNTER — Emergency Department

## 2023-08-05 ENCOUNTER — Other Ambulatory Visit: Payer: Self-pay

## 2023-08-05 ENCOUNTER — Inpatient Hospital Stay: Admitting: Primary Care

## 2023-08-05 ENCOUNTER — Emergency Department
Admission: EM | Admit: 2023-08-05 | Discharge: 2023-08-05 | Disposition: A | Discharge status: Home / Self Care | Attending: Emergency Medicine | Admitting: Emergency Medicine

## 2023-08-05 DIAGNOSIS — S62644A Nondisplaced fracture of proximal phalanx of right ring finger, initial encounter for closed fracture: Secondary | ICD-10-CM | POA: Diagnosis not present

## 2023-08-05 DIAGNOSIS — R519 Headache, unspecified: Secondary | ICD-10-CM | POA: Diagnosis not present

## 2023-08-05 DIAGNOSIS — S62646A Nondisplaced fracture of proximal phalanx of right little finger, initial encounter for closed fracture: Secondary | ICD-10-CM | POA: Insufficient documentation

## 2023-08-05 DIAGNOSIS — S0083XA Contusion of other part of head, initial encounter: Secondary | ICD-10-CM | POA: Insufficient documentation

## 2023-08-05 DIAGNOSIS — W010XXA Fall on same level from slipping, tripping and stumbling without subsequent striking against object, initial encounter: Secondary | ICD-10-CM | POA: Diagnosis not present

## 2023-08-05 DIAGNOSIS — I6782 Cerebral ischemia: Secondary | ICD-10-CM | POA: Diagnosis not present

## 2023-08-05 DIAGNOSIS — G44309 Post-traumatic headache, unspecified, not intractable: Secondary | ICD-10-CM | POA: Diagnosis not present

## 2023-08-05 DIAGNOSIS — M154 Erosive (osteo)arthritis: Secondary | ICD-10-CM | POA: Diagnosis not present

## 2023-08-05 DIAGNOSIS — S62604A Fracture of unspecified phalanx of right ring finger, initial encounter for closed fracture: Secondary | ICD-10-CM

## 2023-08-05 DIAGNOSIS — S12120A Other displaced dens fracture, initial encounter for closed fracture: Secondary | ICD-10-CM | POA: Diagnosis not present

## 2023-08-05 DIAGNOSIS — S6991XA Unspecified injury of right wrist, hand and finger(s), initial encounter: Secondary | ICD-10-CM | POA: Diagnosis not present

## 2023-08-05 DIAGNOSIS — M4802 Spinal stenosis, cervical region: Secondary | ICD-10-CM | POA: Diagnosis not present

## 2023-08-05 DIAGNOSIS — S62614A Displaced fracture of proximal phalanx of right ring finger, initial encounter for closed fracture: Secondary | ICD-10-CM | POA: Insufficient documentation

## 2023-08-05 DIAGNOSIS — S12100A Unspecified displaced fracture of second cervical vertebra, initial encounter for closed fracture: Secondary | ICD-10-CM | POA: Diagnosis not present

## 2023-08-05 DIAGNOSIS — M47812 Spondylosis without myelopathy or radiculopathy, cervical region: Secondary | ICD-10-CM | POA: Diagnosis not present

## 2023-08-05 DIAGNOSIS — S62606A Fracture of unspecified phalanx of right little finger, initial encounter for closed fracture: Secondary | ICD-10-CM

## 2023-08-05 NOTE — ED Notes (Signed)
 Ocl splint applied to the right hand and forearm, pt tol well

## 2023-08-05 NOTE — Discharge Instructions (Addendum)
 Wear c collar when out of bed, do not wear while sleeping or eating Follow-up with Dr. Adriane Hora office on May 6 at 10 AM with Lucetta Russel Follow-up with Dr. Adriane Hora  office on June 3 at 11:30 AM with Dr. Mont Antis Call your regular orthopedic for an appointment due to the fractures of your fingers Return emergency department worsening Tylenol  for pain as needed

## 2023-08-05 NOTE — Telephone Encounter (Signed)
 Patient and husband came in office today for scheduled appointment with Polly Brink. Advised that Polly Brink is out of the office today. We had no available appointments open with any other provider. Patient had fall on 4/17. Reached out to triage nurse the next day and was advised that they needed to be seen. Patient was scheduled for today 4/22 with PCP. Patient husband was under the impression that our office was closed on Monday.   Patient tripped over something in the floor and fell hitting head and right hand. Hand has large amount of busing and selling. She has noticed change in her ability to move and grip. She is no longer able to straighten fingers to the extent before fall. All fingers have significant swelling. Fingers and hand are tender to touch. She has been taking over the counter pain medications but not helping much.  She has abrasion on center of fore head nose. She denies any loss of consciousness with fall or changes in vision. She is not on blood thinner. She has had headaches that started after fall. OTC meds help some but not much. She is taking tylenol  not any ibuprofen. She is having increased neck pain. Notes decreased mobility and pain in neck and shoulders.  Patient and husband are requesting CT. Advised that we are not able to do in office and recommended ED evaluation in order to have addressed and treated in a more timely manner. They have agreed.  After conversation patient and husband seamed much more understanding but wanted to make sure that we are aware of a few breakdowns in our system. One they are concerned with how the triage call was handled. It was his understanding that our office was closed from Friday to Monday. Apologized for miscommunication. Advised that our office was open however Polly Brink is not in the office on Mondays and that may have been what led to confustion. He was also upset with what appeared to be the lack of communication or concern by check in. Advised that is  never our goal as an office. Made patient aware that  they were in constant communication with clinical staff trying to work out a plan to have patients questions addressed while maintaining clinic flow. They appeared to be understanding of that after our conversation. They will reach out if any further questions or problems.

## 2023-08-05 NOTE — ED Triage Notes (Signed)
 Patient to ED via POV after a fall. Pt reports tripping over a board and falling hitting forehead. Pt c/o right hand pain- bruising noted, neck pain, headache. Denies dizziness or blurred vision. No LOC or blood thinners. Ambulatory to triage.

## 2023-08-05 NOTE — ED Notes (Signed)
 Pt states that she tripped over a board in her closet last Thursday, states that she had a hard time getting seen for eval due to the holiday weekend and states that she couldn't sit and wait in the ER. C-collar applied, pt states that her right hand and neck hurt the most, pt has an abrasion to the bridge of her nose and right forehead, pt denies loc

## 2023-08-05 NOTE — ED Provider Notes (Signed)
 [facility] Provider Note    Event Date/Time   First MD Initiated Contact with Patient 08/05/23 1017     (approximate)   History   Fall   HPI  [name] [name] is a 88 y.o. female with history of osteoarthritis, MRSA, fibromyalgia, and other chronic medical problems presents emergency department after a fall on Thursday which was 5 days ago.  Patient tripped over a board that was in her closet and fell forwards.  Complaining of hitting the forehead.  Complaining of neck pain.  No numbness or tingling.  No LOC      Physical Exam   Triage Vital Signs: ED Triage Vitals  Encounter Vitals Group     BP 08/05/23 0956 (!) 150/67     Systolic BP Percentile --      Diastolic BP Percentile --      Pulse Rate 08/05/23 0956 93     Resp 08/05/23 0956 17     Temp 08/05/23 0956 97.6 F (36.4 C)     Temp Source 08/05/23 0956 Oral     SpO2 08/05/23 0956 100 %     Weight 08/05/23 0955 145 lb (65.8 kg)     Height 08/05/23 0955 4\' 8"  (1.422 m)     Head Circumference --      Peak Flow --      Pain Score 08/05/23 0955 7     Pain Loc --      Pain Education --      Exclude from Growth Chart --     Most recent vital signs: Vitals:   08/05/23 0956  BP: (!) 150/67  Pulse: 93  Resp: 17  Temp: 97.6 F (36.4 C)  SpO2: 100%     General: Awake, no distress.   CV:  Good peripheral perfusion. regular rate and  rhythm Resp:  Normal effort. Abd:  No distention.   Other:  Bruise noted across the left side of forehead, C-spine is tender, T-spine has some tenderness but patient states it is the same as it normally is, right hand is bruised swollen and questionable deformity versus severe arthritis, neurovascular intact   ED Results / Procedures / Treatments   Labs (all labs ordered are listed, but only abnormal results are displayed) Labs Reviewed - No data to display   EKG     RADIOLOGY CT of the head cervical spine, x-ray of the right  hand    PROCEDURES:   Procedures Chief Complaint  Patient presents with   Fall      MEDICATIONS ORDERED IN ED: Medications - No data to display   IMPRESSION / MDM / ASSESSMENT AND PLAN / ED COURSE  I reviewed the triage vital signs and the nursing notes.                              Differential diagnosis includes, but is not limited to, fracture, contusion, strain, subdural, CVA, SAH  Patient's presentation is most consistent with acute illness / injury with system symptoms.   CT of cervical spine, radiology called to discuss.  Patient has a dens to fracture   X-ray of the right hand was independently interpreted by me as having a lot of arthritis, questionable fracture, fracture confirmed by radiology at the proximal 4th and 5th phalanx  CT of the head still pending, CT of the head, did independently review the images and the radiologist report, interpret this as  being negative for any acute abnormality  X-ray of the right hand does show fractures along the proximal 4th and 5th phalanxes, this was confirmed by radiology  Patient was placed in [place] c-collar, ulnar gutter splint.  Consult to neurosurgery, Dr. Jeris Montes states patient can follow-up outpatient, she is to wear the c-collar when out of bed.  She is to take it off to sleep and eat.  Appointments were confirmed for her with Dr. Adriane Hora office for May 6 at 10 AM with Lucetta Russel and with Dr. Jeris Montes on June 3.  All of this information was given to the patient.    Did give the patient information for general orthopedics for the fractures in her hand, however the patient does have a regular orthopedist and I encouraged her to follow-up with them.  I did offer pain medication.  Patient wants to only take Tylenol  as she does not do well with narcotics.  I feel that she is stable enough to be discharged to home.  She is in agreement with the treatment plan.  Discharged stable condition in the care of her  husband   FINAL CLINICAL IMPRESSION(S) / ED DIAGNOSES   Final diagnoses:  Closed odontoid fracture, initial encounter (HCC)  Closed displaced fracture of phalanx of right ring finger, unspecified phalanx, initial encounter  Closed nondisplaced fracture of phalanx of right little finger, unspecified phalanx, initial encounter     Rx / DC Orders   ED Discharge Orders     None        Note:  This document was prepared using Dragon voice recognition software and may include unintentional dictation errors.    Delsie Figures, PA-C 08/05/23 1258    Bradler, Evan K, MD 08/07/23 [phone]

## 2023-08-05 NOTE — Telephone Encounter (Signed)
Noted. Thanks. To PCP as FYI.

## 2023-08-06 NOTE — Telephone Encounter (Signed)
 Noted and will review ED notes.

## 2023-08-11 ENCOUNTER — Encounter: Payer: Self-pay | Admitting: Internal Medicine

## 2023-08-11 ENCOUNTER — Ambulatory Visit (INDEPENDENT_AMBULATORY_CARE_PROVIDER_SITE_OTHER): Admitting: Physician Assistant

## 2023-08-11 ENCOUNTER — Encounter: Payer: Self-pay | Admitting: Physician Assistant

## 2023-08-11 DIAGNOSIS — S62614A Displaced fracture of proximal phalanx of right ring finger, initial encounter for closed fracture: Secondary | ICD-10-CM

## 2023-08-11 DIAGNOSIS — S62646G Nondisplaced fracture of proximal phalanx of right little finger, subsequent encounter for fracture with delayed healing: Secondary | ICD-10-CM

## 2023-08-11 DIAGNOSIS — S62614D Displaced fracture of proximal phalanx of right ring finger, subsequent encounter for fracture with routine healing: Secondary | ICD-10-CM

## 2023-08-11 NOTE — Progress Notes (Signed)
 HPI: [name] returns today new complaint of right hand pain.  She states that on April 17 she had a fall whenever she tripped over a board that was in her closet and fell forward.  She hit her forehead had neck pain also pain in her right hand.  She was first evaluated in the ER on 08/05/2023.  There she was found to have an odontoid fracture which neurosurgery was consulted for.  Patient was placed in a [place] c-collar.  She was also found to have a right closed fourth finger proximal phalanx intra-articular fracture with slight displacement.  Nondisplaced shaft fracture involving the fifth proximal phalanx.  For the 4th and 5th proximal phalanx fracture she is placed in an ulnar gutter splint.  She presents here today for follow-up of the right hand. Radiographs reviewed from 08/05/2023 right hand multiple views show significant arthritis involving the PIP and DIP joints of the 2nd through 5th fingers.  Basilar fourth proximal phalanx intra-articular fracture with slight impaction and displacement.  Fifth proximal phalanx shaft fracture nondisplaced.  Nonarticular.  Review of systems: See HPI otherwise negative  Physical exam: General Well-developed well-nourished female who walks with a slow antalgic gait. Psych: Alert and oriented x 3 Respirations: Unlabored Cervical spine: [place] c-collar in place Right hand: Significant arthritic changes throughout all of the fingers.  Radial pulse intact.  Ecchymosis involving the hand particularly at the 4th and 5th metacarpal phalangeal joint regions dorsally.  Tenderness over the proximal fifth phalanx.  Minimal tenderness over the fourth proximal phalanx.  Impression: Right hand 4th and 5th proximal phalanx fractures  Plan: Placed her in a removable ulnar gutter splint.  See her back in 4 weeks obtain 3 views of the hand.  She can come out of the ulnar gutter splint for hygiene purposes.  Otherwise treat this as a cast.  Questions were encouraged and  answered at length.

## 2023-08-15 ENCOUNTER — Telehealth: Payer: Self-pay | Admitting: *Deleted

## 2023-08-15 DIAGNOSIS — D649 Anemia, unspecified: Secondary | ICD-10-CM

## 2023-08-15 NOTE — Telephone Encounter (Signed)
 Daughter called in saying that mother is not feeling well and she has gotten critical at times with the anemia and they wanted to see if maybe she  could move up appointment for labs and possible blood transfusion.

## 2023-08-15 NOTE — Telephone Encounter (Signed)
 Daughter called back - Dr. Alana Hoyle agreed to see patient on Monday. Msg sent to scheduling - add pt at 245 on Monday 5/5 and apt with Dr. Aris Bel at [facility]. daughter is aware.

## 2023-08-18 ENCOUNTER — Telehealth: Payer: Self-pay | Admitting: *Deleted

## 2023-08-18 ENCOUNTER — Encounter: Payer: Self-pay | Admitting: Internal Medicine

## 2023-08-18 ENCOUNTER — Telehealth: Payer: Self-pay

## 2023-08-18 ENCOUNTER — Other Ambulatory Visit: Payer: Self-pay | Admitting: *Deleted

## 2023-08-18 ENCOUNTER — Inpatient Hospital Stay

## 2023-08-18 ENCOUNTER — Inpatient Hospital Stay: Attending: Internal Medicine | Admitting: Internal Medicine

## 2023-08-18 ENCOUNTER — Other Ambulatory Visit: Payer: Self-pay | Admitting: Orthopedic Surgery

## 2023-08-18 VITALS — BP 120/47 | HR 88 | Temp 97.0°F | Resp 16 | Wt 156.0 lb

## 2023-08-18 DIAGNOSIS — Z85820 Personal history of malignant melanoma of skin: Secondary | ICD-10-CM | POA: Insufficient documentation

## 2023-08-18 DIAGNOSIS — M419 Scoliosis, unspecified: Secondary | ICD-10-CM | POA: Diagnosis not present

## 2023-08-18 DIAGNOSIS — E538 Deficiency of other specified B group vitamins: Secondary | ICD-10-CM | POA: Insufficient documentation

## 2023-08-18 DIAGNOSIS — Z79890 Hormone replacement therapy (postmenopausal): Secondary | ICD-10-CM | POA: Insufficient documentation

## 2023-08-18 DIAGNOSIS — D649 Anemia, unspecified: Secondary | ICD-10-CM

## 2023-08-18 DIAGNOSIS — M154 Erosive (osteo)arthritis: Secondary | ICD-10-CM | POA: Diagnosis not present

## 2023-08-18 DIAGNOSIS — Z860100 Personal history of colon polyps, unspecified: Secondary | ICD-10-CM | POA: Diagnosis not present

## 2023-08-18 DIAGNOSIS — D62 Acute posthemorrhagic anemia: Secondary | ICD-10-CM | POA: Insufficient documentation

## 2023-08-18 DIAGNOSIS — I081 Rheumatic disorders of both mitral and tricuspid valves: Secondary | ICD-10-CM | POA: Diagnosis not present

## 2023-08-18 DIAGNOSIS — D508 Other iron deficiency anemias: Secondary | ICD-10-CM

## 2023-08-18 DIAGNOSIS — R0602 Shortness of breath: Secondary | ICD-10-CM | POA: Diagnosis not present

## 2023-08-18 DIAGNOSIS — Z803 Family history of malignant neoplasm of breast: Secondary | ICD-10-CM | POA: Insufficient documentation

## 2023-08-18 DIAGNOSIS — D509 Iron deficiency anemia, unspecified: Secondary | ICD-10-CM | POA: Insufficient documentation

## 2023-08-18 DIAGNOSIS — E785 Hyperlipidemia, unspecified: Secondary | ICD-10-CM | POA: Insufficient documentation

## 2023-08-18 DIAGNOSIS — M797 Fibromyalgia: Secondary | ICD-10-CM | POA: Diagnosis not present

## 2023-08-18 DIAGNOSIS — I6523 Occlusion and stenosis of bilateral carotid arteries: Secondary | ICD-10-CM | POA: Insufficient documentation

## 2023-08-18 DIAGNOSIS — Z90710 Acquired absence of both cervix and uterus: Secondary | ICD-10-CM | POA: Insufficient documentation

## 2023-08-18 DIAGNOSIS — K254 Chronic or unspecified gastric ulcer with hemorrhage: Secondary | ICD-10-CM | POA: Insufficient documentation

## 2023-08-18 DIAGNOSIS — Z791 Long term (current) use of non-steroidal anti-inflammatories (NSAID): Secondary | ICD-10-CM | POA: Insufficient documentation

## 2023-08-18 DIAGNOSIS — M47812 Spondylosis without myelopathy or radiculopathy, cervical region: Secondary | ICD-10-CM | POA: Insufficient documentation

## 2023-08-18 DIAGNOSIS — S12100A Unspecified displaced fracture of second cervical vertebra, initial encounter for closed fracture: Secondary | ICD-10-CM

## 2023-08-18 DIAGNOSIS — K219 Gastro-esophageal reflux disease without esophagitis: Secondary | ICD-10-CM | POA: Diagnosis not present

## 2023-08-18 DIAGNOSIS — S12110A Anterior displaced Type II dens fracture, initial encounter for closed fracture: Secondary | ICD-10-CM | POA: Diagnosis not present

## 2023-08-18 DIAGNOSIS — Z79899 Other long term (current) drug therapy: Secondary | ICD-10-CM | POA: Insufficient documentation

## 2023-08-18 LAB — CBC WITH DIFFERENTIAL (CANCER CENTER ONLY)
Abs Immature Granulocytes: 0.02 10*3/uL (ref 0.00–0.07)
Basophils Absolute: 0 10*3/uL (ref 0.0–0.1)
Basophils Relative: 0 %
Eosinophils Absolute: 0.1 10*3/uL (ref 0.0–0.5)
Eosinophils Relative: 2 %
HCT: 21.4 % — ABNORMAL LOW (ref 36.0–46.0)
Hemoglobin: 6.3 g/dL — CL (ref 12.0–15.0)
Immature Granulocytes: 1 %
Lymphocytes Relative: 25 %
Lymphs Abs: 0.8 10*3/uL (ref 0.7–4.0)
MCH: 32 pg (ref 26.0–34.0)
MCHC: 29.4 g/dL — ABNORMAL LOW (ref 30.0–36.0)
MCV: 108.6 fL — ABNORMAL HIGH (ref 80.0–100.0)
Monocytes Absolute: 0.3 10*3/uL (ref 0.1–1.0)
Monocytes Relative: 9 %
Neutro Abs: 2 10*3/uL (ref 1.7–7.7)
Neutrophils Relative %: 63 %
Platelet Count: 243 10*3/uL (ref 150–400)
RBC: 1.97 MIL/uL — ABNORMAL LOW (ref 3.87–5.11)
RDW: 18.4 % — ABNORMAL HIGH (ref 11.5–15.5)
WBC Count: 3.1 10*3/uL — ABNORMAL LOW (ref 4.0–10.5)
nRBC: 0.6 % — ABNORMAL HIGH (ref 0.0–0.2)

## 2023-08-18 LAB — SAMPLE TO BLOOD BANK

## 2023-08-18 LAB — IRON AND TIBC
Iron: 52 ug/dL (ref 28–170)
Saturation Ratios: 9 % — ABNORMAL LOW (ref 10.4–31.8)
TIBC: 578 ug/dL — ABNORMAL HIGH (ref 250–450)
UIBC: 526 ug/dL

## 2023-08-18 LAB — PREPARE RBC (CROSSMATCH)

## 2023-08-18 LAB — FERRITIN: Ferritin: 25 ng/mL (ref 11–307)

## 2023-08-18 NOTE — Telephone Encounter (Signed)
 Per Dr. Randy Buttery- patient will reach out to [name] to determine what GI provider they refer [name] [name] vs [name]).  Also - when patient returns next week- ok to transfuse if hgb drops below 7

## 2023-08-18 NOTE — Telephone Encounter (Signed)
-----   Message from Luke Salaam sent at 08/18/2023  4:28 PM EDT ----- Regarding: RE: acute anemia [facility]   Please call patient and refer where she prefers to go . Inform am moving to [place] and if she has to see me or wishes to discuss would need to make appointment after July 1 .   Suggest to start process of referral to tertiary center   Kiran ----- Message ----- From: Agrawal, Kavita, MD Sent: 08/18/2023   4:25 PM EDT To: Luke Salaam, MD Subject: acute anemia                                   Hello Dr. Antony Baumgartner,   I just wanted to update you about this patient. Had drop in Hb again to 6.3. Declined ER eval. We have her scheduled for 2 units PRBC on Wed.   Had GI work up in the past with capsule endoscopy showed minimal blood tinged material and you had recommended higher center for balloon enteroscopy. She is contemplating between [facility] and [address].   Thanks  Kavita

## 2023-08-18 NOTE — Progress Notes (Addendum)
 [facility]  [phone] [phone]  ID: LAKAIYA AYNES OB: 1935-07-13  MR#: [id number]  VHQ#:[id number]  Patient Care Team: Gabriel John, NP as PCP - General (Internal Medicine) Devorah Fonder, MD as PCP - Cardiology (Cardiology) Loreatha Rodney, MD as Consulting Physician (Oncology)  HPI: [name] [name] is a 88 y.o. female with past medical history of hyperlipidemia, GERD, anxiety, fibromyalgia, osteoarthritis melanoma, MR was referred to hematology clinic for further management of iron deficiency anemia.   he has history of hysterectomy due to excessive bleeding in the past.  Denies any gastric bypass surgery.  She had right knee replacement in June 2023 when she required 1 unit of blood transfusion.  She was also started on oral iron which could not tolerate because of severe nausea.  She last had colonoscopy and endoscopy more than 15 years ago.  Colonoscopy from April 2024 by Dr. Antony Baumgartner showed few polyps with pathology showing tubular adenoma.  No repeat advised due to age.  Upper endoscopy showed hematin in gastric antrum and 2 superficial nonbleeding gastric ulcer.  She was started on Prilosec  40 mg twice daily which she reports compliance to.    She received 2 doses of IV Ferraheme in December 2023.  Her hemoglobin was stable until June 2024 when it dropped to 6.9. s/p 1 unit PRBC. S/p capsule endoscopy on 10/25/2022 which showed minimal blood-tinged material seen at 50-minute 6-second amount but only a few seconds.  Was recommended close monitoring of iron level and hemoglobin and if it drops then would need balloon enteroscopy to rule out small underlying AVM.  Patient continued to have mild anemia despite correction of iron.  Discussed about continued observation.  Patient preferred to have bone marrow biopsy done for further work up.  S/p BMBx on 06/02/2023 showed mildly hypercellular bone marrow 30% on limited clot/biopsy specimen.   Otherwise orderly trilineage hematopoiesis.  No morphologic evidence of dysplasia.  Cytogenetics normal.    Interval history Patient was seen today as acute visit accompanied by her daughter for iron deficiency anemia, labs.  She had a fall about 3 weeks ago tripped over a board that was in her closet.    CT cervical spine showed acute nondisplaced type II dens fracture with slight posterior subluxation of the left lateral atlantoaxial joint.  On x-ray right hand intra-articular impacted fracture involving the base of the proximal phalanx of the fourth digit.  Nondisplaced fracture involving the base of the fifth proximal phalanx.  She was in the cervical collar.  Scheduled to see neurosurgery tomorrow.  She has a brace on for finger fracture.  For the past couple of weeks, she has been feeling increasingly fatigued with shortness of breath.  Has been noticing dark stools.  She is not on blood thinners.  REVIEW OF SYSTEMS:   Review of Systems  Respiratory:  Positive for shortness of breath.     As per HPI. Otherwise, a complete review of systems is negative.  PAST MEDICAL HISTORY: Past Medical History:  Diagnosis Date   Anxiety    prev on prozac   Chest pain    Felt to be noncardiac in the past   Chronic rectal fissure    Colon polyp    Dyslipidemia    Fibromyalgia    GERD (gastroesophageal reflux disease)    Barrett's esophagus   Hiatal hernia    Lumbar spine pain    Complex lumbar spine surgery at [facility], 2012, complicated, MRSA, much of the  appliance is removed, patient on chronic antibiotics   Melanoma (HCC)    Mitral regurgitation    a. 2007 Echo Mild MR; b. 10/2016 Echo: EF 55-60%, no rwma, GrI DD, Mild MR.   Nausea and vomiting 08/22/2021   Osteoarthritis    Scoliosis    Sinusitis    Multiple sinus operations in the past   Statin intolerance    As of 2009, no further attempts to use statins   Thyroid  nodule     PAST SURGICAL HISTORY: Past Surgical History:   Procedure Laterality Date   ABDOMINAL HYSTERECTOMY     BACK SURGERY     CHOLECYSTECTOMY     COLONOSCOPY WITH PROPOFOL  N/A 08/07/2022   Procedure: COLONOSCOPY WITH PROPOFOL ;  Surgeon: Luke Salaam, MD;  Location: [facility] ENDOSCOPY;  Service: Gastroenterology;  Laterality: N/A;   ESOPHAGOGASTRODUODENOSCOPY (EGD) WITH PROPOFOL  N/A 08/07/2022   Procedure: ESOPHAGOGASTRODUODENOSCOPY (EGD) WITH PROPOFOL ;  Surgeon: Luke Salaam, MD;  Location: [facility] ENDOSCOPY;  Service: Gastroenterology;  Laterality: N/A;   FOOT SURGERY     GIVENS CAPSULE STUDY N/A 10/09/2022   Procedure: GIVENS CAPSULE STUDY;  Surgeon: Luke Salaam, MD;  Location: [facility] ENDOSCOPY;  Service: Gastroenterology;  Laterality: N/A;   HERNIA REPAIR     IR BONE MARROW BIOPSY & ASPIRATION  06/02/2023   KNEE SURGERY Right 08/08/2021   total knee replacement   NASAL SINUS SURGERY      FAMILY HISTORY: Family History  Problem Relation Age of Onset   GER disease Mother    Dementia Mother    Breast cancer Mother 19   Heart disease Father        MI at 69   Scoliosis Daughter    Colon cancer Neg Hx     HEALTH MAINTENANCE: Social History   Tobacco Use   Smoking status: Never    Passive exposure: Never   Smokeless tobacco: Never  Vaping Use   Vaping status: Never Used  Substance Use Topics   Alcohol use: No   Drug use: No     Allergies  Allergen Reactions   Other Other (See Comments)    Other Reaction: when taking for an extended period of time causes mouth ulcers and esophagus irritation Other Reaction: when taking for an extended period of time causes mouth ulcers and esophagus irritation    Fenofibric Acid Other (See Comments)    GI upset   Nsaids     REACTION: No long term use   Statins Other (See Comments)    Intolerant, myalgias Intolerant, myalgias Intolerant, myalgias Intolerant, myalgias Intolerant, myalgias Intolerant, myalgias    Trilipix [Choline Fenofibrate ]     GI upset   Wasp Venom Swelling    Current  Outpatient Medications  Medication Sig Dispense Refill   ALPHA LIPOIC ACID PO Take 1 tablet by mouth daily. Daily     amitriptyline  (ELAVIL ) 10 MG tablet TAKE 1 TABLET (10 MG TOTAL) BY MOUTH AT BEDTIME FOR HEADACHE PREVENTION 90 tablet 2   calcium carbonate (OS-CAL) 600 MG TABS Take 1,200 mg by mouth 2 (two) times daily with a meal.     cholecalciferol (VITAMIN D) 1000 UNITS tablet Take 1,000 Units by mouth daily.     ciclopirox (LOPROX) 0.77 % cream Apply 1 g/mL topically 2 (two) times daily.     clotrimazole -betamethasone  (LOTRISONE ) cream APPLY TOPICALLY 2 (TWO) TIMES DAILY AS NEEDED. 30 g 0   diclofenac  sodium (VOLTAREN ) 1 % GEL Apply 2-4 grams to affected area up to 4 times daily 4 Tube  2   fenofibrate  micronized (LOFIBRA) 134 MG capsule TAKE 1 CAPSULE (134 MG TOTAL) BY MOUTH AT BEDTIME FOR CHOLESTEROL. 90 capsule 2   fish oil-omega-3 fatty acids 1000 MG capsule Take 2 g by mouth 2 (two) times daily.     gabapentin  (NEURONTIN ) 100 MG capsule TAKE 1 CAPSULE BY MOUTH THREE TIMES A DAY 270 capsule 0   Ginger, Zingiber officinalis, (GINGER PO) Take 1 tablet by mouth daily.     levothyroxine  (SYNTHROID ) 50 MCG tablet TAKE 1 TABLET BY MOUTH EVERY MORNING WITH WATER ON AN EMPTY STOMACH. NO OTHER FOODS/MEDS FOR 30 MINS 90 tablet 2   metoprolol  tartrate (LOPRESSOR ) 25 MG tablet TAKE 1 TAB BY MOUTH TWICE A DAY AND EXTRA TABLET AS NEEDED FOR BREAKTHROUGH PALPITATIONS AS DIRECTED 180 tablet 3   Multiple Vitamins-Minerals (MULTIVITAMIN WITH MINERALS) tablet Take 1 tablet by mouth daily.     NON FORMULARY Take 1 capsule by mouth daily. Tumeric      Daily     sulfamethoxazole -trimethoprim  (BACTRIM  DS) 800-160 MG tablet TAKE 1 TABLET (160 MG OF TRIMETHOPRIM  TOTAL) BY MOUTH 2 (TWO) TIMES DAILY 180 tablet 3   tretinoin (RETIN-A) 0.05 % cream Apply 1 application topically daily as needed (as directed per dermatologist).   3   omeprazole  (PRILOSEC ) 20 MG capsule TAKE 2 CAPS (40 MG TOTAL) BY MOUTH IN THE MORNING  AND AT BEDTIME. 360 capsule 0   No current facility-administered medications for this visit.    OBJECTIVE: Vitals:   08/18/23 1503  BP: (!) 120/47  Pulse: 88  Resp: 16  Temp: (!) 97 F (36.1 C)  SpO2: 98%     Body mass index is 34.97 kg/m.      General: Well-developed, well-nourished, no acute distress. Eyes: Pink conjunctiva, anicteric sclera. HEENT: Normocephalic, moist mucous membranes, clear oropharnyx. Lungs: Clear to auscultation bilaterally. Heart: Regular rate and rhythm. No rubs, murmurs, or gallops. Abdomen: Soft, nontender, nondistended. No organomegaly noted, normoactive bowel sounds. Musculoskeletal: No edema, cyanosis, or clubbing. Neuro: Alert, answering all questions appropriately. Cranial nerves grossly intact. Skin: No rashes or petechiae noted. Psych: Normal affect. Lymphatics: No cervical, calvicular, axillary or inguinal LAD.   LAB RESULTS:  Lab Results  Component Value Date   NA 139 06/09/2023   K 3.9 06/09/2023   CL 103 06/09/2023   CO2 24 06/09/2023   GLUCOSE 137 (H) 06/09/2023   BUN 29 (H) 06/09/2023   CREATININE 1.03 (H) 06/09/2023   CALCIUM 9.1 06/09/2023   PROT 6.4 (L) 06/09/2023   ALBUMIN 4.2 06/09/2023   AST 21 06/09/2023   ALT 17 06/09/2023   ALKPHOS 56 06/09/2023   BILITOT 0.6 06/09/2023   GFRNONAA 53 (L) 06/09/2023    Lab Results  Component Value Date   WBC 3.1 (L) 08/18/2023   NEUTROABS 2.0 08/18/2023   HGB 6.3 (LL) 08/18/2023   HCT 21.4 (L) 08/18/2023   MCV 108.6 (H) 08/18/2023   PLT 243 08/18/2023    Lab Results  Component Value Date   TIBC 475 (H) 06/09/2023   TIBC 503 (H) 04/21/2023   TIBC 462 (H) 02/07/2023   FERRITIN 48 06/09/2023   FERRITIN 66 04/21/2023   FERRITIN 236 02/07/2023   IRONPCTSAT 41 (H) 06/09/2023   IRONPCTSAT 11 04/21/2023   IRONPCTSAT 17 02/07/2023     STUDIES: CT Head Wo Contrast Result Date: 08/05/2023 CLINICAL DATA:  Headache, posttraumatic EXAM: CT HEAD WITHOUT CONTRAST TECHNIQUE:  Contiguous axial images were obtained from the base of the skull through the  vertex without intravenous contrast. RADIATION DOSE REDUCTION: This exam was performed according to the departmental dose-optimization program which includes automated exposure control, adjustment of the mA and/or kV according to patient size and/or use of iterative reconstruction technique. COMPARISON:  02/21/2020 FINDINGS: Brain: No evidence of acute infarction, hemorrhage, hydrocephalus, extra-axial collection or mass lesion/mass effect. Extensive chronic small vessel ischemia in the deep cerebral white matter. Mild for age cerebral volume loss. Vascular: No hyperdense vessel or unexpected calcification. Skull: Normal. Negative for fracture or focal lesion. Sinuses/Orbits: No evidence of injury IMPRESSION: No evidence of intracranial injury. Electronically Signed   By: Ronnette Coke M.D.   On: 08/05/2023 11:57   DG Hand Complete Right Result Date: 08/05/2023 CLINICAL DATA:  Status post fall with right hand bruising EXAM: RIGHT HAND - COMPLETE 3+ VIEW COMPARISON:  March 29, 2020 FINDINGS: There is severe erosive osteoarthritis involving the PIP and D IP joints second through fifth digits and distal interphalangeal joint of the first digit. With significant flexion deformities. Intra-articular impacted fracture involving the base of the proximal phalanx of the fourth digit. Nondisplaced fracture involving the base of the fifth proximal phalanx Electronically Signed   By: Fredrich Jefferson M.D.   On: 08/05/2023 11:49   CT Cervical Spine Wo Contrast Addendum Date: 08/05/2023 ADDENDUM REPORT: 08/05/2023 10:39 ADDENDUM: Critical Value/emergent results were called by telephone at the time of interpretation on 08/05/2023 at 10:31 am to provider Aneta Keepers, PA, who verbally acknowledged these results. Electronically Signed   By: Elmon Hagedorn M.D.   On: 08/05/2023 10:39   Result Date: 08/05/2023 CLINICAL DATA:  Neck trauma. Neck pain  after falling. Neck pain and headache. EXAM: CT CERVICAL SPINE WITHOUT CONTRAST TECHNIQUE: Multidetector CT imaging of the cervical spine was performed without intravenous contrast. Multiplanar CT image reconstructions were also generated. RADIATION DOSE REDUCTION: This exam was performed according to the departmental dose-optimization program which includes automated exposure control, adjustment of the mA and/or kV according to patient size and/or use of iterative reconstruction technique. COMPARISON:  CT cervical spine 02/21/2020. FINDINGS: Alignment: Stable near anatomic cervical alignment. No focal angulation or significant listhesis. Skull base and vertebrae: There is an acute nondisplaced fracture through the base of the odontoid process, best seen on the sagittal and coronal images (type 2 dens fracture) resulting in slight posterior subluxation of the left lateral atlantoaxial joint. No other evidence of acute fracture or traumatic subluxation. Soft tissues and spinal canal: No prevertebral fluid or swelling. No visible canal hematoma. Synovial thickening around the odontoid process, similar to previous CT. No significant epidural hematoma identified. Disc levels: Multilevel spondylosis with disc space narrowing, uncinate spurring and facet hypertrophy, similar to previous CT. No large disc herniation or high-grade spinal stenosis demonstrated. Mild to moderate foraminal narrowing at multiple levels. Upper chest: No acute findings. Other: Bilateral carotid atherosclerosis. IMPRESSION: 1. Acute nondisplaced type 2 dens fracture with slight posterior subluxation of the left lateral atlantoaxial joint. 2. No other evidence of acute cervical spine fracture, traumatic subluxation or static signs of instability. 3. Multilevel cervical spondylosis, similar to previous CT. Electronically Signed: By: Elmon Hagedorn M.D. On: 08/05/2023 10:28   ECHOCARDIOGRAM COMPLETE Result Date: 07/29/2023    ECHOCARDIOGRAM  REPORT   Patient Name:   DEEPIKA JEFF [facility] Date of Exam: 07/28/2023 Medical Rec #:  [id number]    Height:       56.0 in Accession #:    [id number]   Weight:       156.0 lb Date of  Birth:  20-Feb-1936    BSA:          1.597 m Patient Age:    88 years     BP:           124/60 mmHg Patient Gender: F            HR:           85 bpm. Exam Location:  [place] Procedure: 2D Echo, Cardiac Doppler and Color Doppler (Both Spectral and Color            Flow Doppler were utilized during procedure). Indications:    R06.02 SOB; R06.9 DOE  History:        Patient has prior history of Echocardiogram examinations, most                 recent 01/14/2022. Mitral Valve Prolapse, Arrythmias:LBBB,                 Signs/Symptoms:Fatigue, Dyspnea and Shortness of Breath; Risk                 Factors:Dyslipidemia.  Sonographer:    Venson Ginger MHA, BS, RDCS Referring Phys: [id number] [facility]  Sonographer Comments: Technically difficult study due to poor echo windows and patient is obese. Image acquisition challenging due to patient body habitus and supine. Very TDS due to inability to turn on left side. IMPRESSIONS  1. Left ventricular ejection fraction, by estimation, is 55 to 60%. The left ventricle has normal function. The left ventricle has no regional wall motion abnormalities. Left ventricular diastolic parameters are consistent with Grade I diastolic dysfunction (impaired relaxation).  2. Right ventricular systolic function is normal. The right ventricular size is normal.  3. The mitral valve is normal in structure. Mild mitral valve regurgitation.  4. The aortic valve was not well visualized. Aortic valve regurgitation is not visualized.  5. The inferior vena cava is normal in size with greater than 50% respiratory variability, suggesting right atrial pressure of 3 mmHg. FINDINGS  Left Ventricle: Left ventricular ejection fraction, by estimation, is 55 to 60%. The left ventricle has normal function. The left ventricle has no  regional wall motion abnormalities. The left ventricular internal cavity size was normal in size. There is  no left ventricular hypertrophy. Left ventricular diastolic parameters are consistent with Grade I diastolic dysfunction (impaired relaxation). Right Ventricle: The right ventricular size is normal. No increase in right ventricular wall thickness. Right ventricular systolic function is normal. Left Atrium: Left atrial size was normal in size. Right Atrium: Right atrial size was normal in size. Pericardium: There is no evidence of pericardial effusion. Mitral Valve: The mitral valve is normal in structure. Mild mitral valve regurgitation. Tricuspid Valve: The tricuspid valve is normal in structure. Tricuspid valve regurgitation is mild. Aortic Valve: The aortic valve was not well visualized. Aortic valve regurgitation is not visualized. Aortic valve mean gradient measures 4.0 mmHg. Aortic valve peak gradient measures 7.1 mmHg. Pulmonic Valve: The pulmonic valve was not well visualized. Pulmonic valve regurgitation is not visualized. Aorta: The aortic root is normal in size and structure. Venous: The inferior vena cava is normal in size with greater than 50% respiratory variability, suggesting right atrial pressure of 3 mmHg. IAS/Shunts: No atrial level shunt detected by color flow Doppler.   Diastology LV e' medial:    7.29 cm/s LV E/e' medial:  12.8 LV e' lateral:   7.18 cm/s LV E/e' lateral: 13.0  RIGHT VENTRICLE RV Basal diam:  3.18  cm RV Mid diam:    1.98 cm RV S prime:     12.30 cm/s TAPSE (M-mode): 1.9 cm LEFT ATRIUM             Index        RIGHT ATRIUM           Index LA Vol (A2C):   22.3 ml 13.96 ml/m  RA Area:     11.00 cm LA Vol (A4C):   21.4 ml 13.40 ml/m  RA Volume:   24.90 ml  15.59 ml/m LA Biplane Vol: 22.8 ml 14.27 ml/m  AORTIC VALVE AV Vmax:           133.00 cm/s AV Vmean:          89.800 cm/s AV VTI:            0.264 m AV Peak Grad:      7.1 mmHg AV Mean Grad:      4.0 mmHg LVOT Vmax:          123.00 cm/s LVOT Vmean:        83.200 cm/s LVOT VTI:          0.234 m LVOT/AV VTI ratio: 0.89 MITRAL VALVE MV Area (PHT): 3.60 cm     SHUNTS MV Decel Time: 211 msec     Systemic VTI: 0.23 m MV E velocity: 93.40 cm/s MV A velocity: 117.00 cm/s MV E/A ratio:  0.80 Constancia Delton MD Electronically signed by Constancia Delton MD Signature Date/Time: 07/29/2023/8:15:31 AM    Final     ASSESSMENT AND PLAN:   SAYGE EARGLE is a 88 y.o. female with pmh of hyperlipidemia, GERD, anxiety, fibromyalgia, osteoarthritis melanoma, MR was referred to hematology clinic for further management of iron deficiency anemia.  #Iron deficiency anemia -Of unclear source.  Could not tolerate oral iron due to nausea.  Colonoscopy from April 2024 by Dr. Antony Baumgartner showed few polyps with pathology showing tubular adenoma.  No repeat advised due to age.  Upper endoscopy showed hematin in gastric antrum and 2 superficial nonbleeding gastric ulcer.  She was started on Prilosec  40 mg twice daily which she reports compliance to.    -Hemoglobin decreased to 6.9.  S/p 1 unit PRBC on 09/20/2022. s/p capsule endoscopy on 10/25/2022 which showed minimal blood-tinged material seen at 50-minute 6-second amount but only a few seconds.  Was recommended close monitoring of iron level and hemoglobin and if it drops then would need balloon enteroscopy to rule out small underlying AVM.    - Had mild anemia despite improvement in iron levels. Pt very concerned. No chronic kidney disease. Discussed about observation (as it may not significantly change the management) versus bone marrow biopsy.  She decided to proceed with the latter. S/p BMBx on 06/02/2023 showed mildly hypercellular bone marrow 30% on limited clot/biopsy specimen.  Otherwise orderly trilineage hematopoiesis.  No morphologic evidence of dysplasia.  Cytogenetics normal.  - Seen as an acute visit today with increasing fatigue, shortness of breath and dark stools.  Hemoglobin has dropped from  11.1 (6 weeks ago) to 6.3 today.  We do not have any infusion chair for blood transfusion tomorrow.  Considering she is symptomatic, recommended ER for blood transfusion.  Patient declined due to long wait time.  She understands the risk of delaying the blood transfusion with critically low hemoglobin at 6.3 which could be life-threatening. We have scheduled her for 2 units of PRBC on Wednesday.  Schedule for IV Feraheme  weekly x 2 doses.  Repeat CBC in 1 week with possible blood transfusion on day 2.  Previously, she saw Dr. Antony Baumgartner who had recommended to be referred to higher center for consideration of balloon enteroscopy to rule out small bowel AVM should her hemoglobin/ferritin drops again.  She is contemplating between [facility] or [facility].  Will let us  know.  Will follow-up with Dr. Randy Buttery in 4 weeks.  # Vitamin B12 deficiency -S/p IM cyanocobalamin  1000 mcg X 4 -Continue with B12 1000 mcg oral daily  # Shortness of breath on exertion - Patient continued to have shortness of breath despite improvement in hemoglobin previously. - Continue follow-up with Dr. Darnelle Elders, pulmonary.  Orders Placed This Encounter  Procedures   CBC with Differential (Cancer Center Only)   CBC with Differential (Cancer Center Only)   Ferritin   Iron and TIBC([facility])   Hold Tube- Blood Bank   Hold Tube- Blood Bank    Patient expressed understanding and was in agreement with this plan. She also understands that She can call clinic at any time with any questions, concerns, or complaints.   I spent a total of 30 minutes reviewing chart data, face-to-face evaluation with the patient, counseling and coordination of care as detailed above.  Krystie Leiter, MD   08/18/2023 4:08 PM

## 2023-08-18 NOTE — Progress Notes (Signed)
 1504- critical value hgb 6.3 called by Burdette Carolin in [facility] lab. Read back process performed. Dr. Aris Bel informed-[id number] of critical value- read back process performed.

## 2023-08-18 NOTE — Telephone Encounter (Signed)
 Called patient to let her know what Dr. Antony Baumgartner and Dr. Aris Bel recommended and she stated that she was aware as she had just left Dr. Loann Rily office. I let her know that I was calling to ask if she wanted to schedule an appointment with Dr. Antony Baumgartner to discuss her options or for us  to go ahead and refer her to either [facility] or [facility] to have the balloon enteroscopy done to find out why her hemoglobin keeps dropping. Patient stated that she would have to speak to her husband first and then will let us  know what she decides on doing. My name and number was provided so she could call back when she made a decision.

## 2023-08-18 NOTE — Progress Notes (Unsigned)
 Referring Physician:  Gabriel John, NP [address] [place],  [place] [address]  Primary Physician:  Gabriel John, NP  History of Present Illness: 08/19/2023 [name] has a history of MVP, bilateral PE, endocarditis, GERD, Barrett's esophagus, hypothyroidism, osteopenia, dyslipidemia, FM.   Had a fall on 07/31/23 and was seen in ED on 08/05/23 with neck pain. Was found to have dens fracture.   She was placed in cervical collar and was told to remove to sleep and eat.   She is here for follow up.   She has only occasional neck pain with no arm pain. It has improved since injury. No numbness, tingling, or weakness. She is not wearing collar she was given in ED. She is wearing a soft collar.   She is set up to have transfusion and iron infusion tomorrow due to anemia.   She is taking neurontin  and voltaren  gel. 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   She does not smoke.   Bowel/Bladder Dysfunction: none  Past Surgery: Back Surgery x 5 No neck surgery   The symptoms are causing a significant impact on the patient's life.   Review of Systems:  A 10 point review of systems is negative, except for the pertinent positives and negatives detailed in the HPI.  Past Medical History: Past Medical History:  Diagnosis Date   Anxiety    prev on prozac   Chest pain    Felt to be noncardiac in the past   Chronic rectal fissure    Colon polyp    Dyslipidemia    Fibromyalgia    GERD (gastroesophageal reflux disease)    Barrett's esophagus   Hiatal hernia    Lumbar spine pain    Complex lumbar spine surgery at [facility], 2012, complicated, MRSA, much of the appliance is removed, patient on chronic antibiotics   Melanoma (HCC)    Mitral regurgitation    a. 2007 Echo Mild MR; b. 10/2016 Echo: EF 55-60%, no rwma, GrI DD, Mild MR.   Nausea and vomiting 08/22/2021   Osteoarthritis    Scoliosis    Sinusitis    Multiple sinus operations in the past    Statin intolerance    As of 2009, no further attempts to use statins   Thyroid  nodule     Past Surgical History: Past Surgical History:  Procedure Laterality Date   ABDOMINAL HYSTERECTOMY     BACK SURGERY     CHOLECYSTECTOMY     COLONOSCOPY WITH PROPOFOL  N/A 08/07/2022   Procedure: COLONOSCOPY WITH PROPOFOL ;  Surgeon: Luke Salaam, MD;  Location: [facility] ENDOSCOPY;  Service: Gastroenterology;  Laterality: N/A;   ESOPHAGOGASTRODUODENOSCOPY (EGD) WITH PROPOFOL  N/A 08/07/2022   Procedure: ESOPHAGOGASTRODUODENOSCOPY (EGD) WITH PROPOFOL ;  Surgeon: Luke Salaam, MD;  Location: [facility] ENDOSCOPY;  Service: Gastroenterology;  Laterality: N/A;   FOOT SURGERY     GIVENS CAPSULE STUDY N/A 10/09/2022   Procedure: GIVENS CAPSULE STUDY;  Surgeon: Luke Salaam, MD;  Location: [facility] ENDOSCOPY;  Service: Gastroenterology;  Laterality: N/A;   HERNIA REPAIR     IR BONE MARROW BIOPSY & ASPIRATION  06/02/2023   KNEE SURGERY Right 08/08/2021   total knee replacement   NASAL SINUS SURGERY      Allergies: Allergies as of 08/19/2023 - Review Complete 08/19/2023  Allergen Reaction Noted   Other Other (See Comments) 07/08/2017   Fenofibric acid Other (See Comments) 09/19/2014   Nsaids  12/15/2009   Statins Other (See Comments) 08/13/2011  Trilipix [choline fenofibrate ]  08/13/2011   Wasp venom Swelling 10/27/2017    Medications: Outpatient Encounter Medications as of 08/19/2023  Medication Sig   ALPHA LIPOIC ACID PO Take 1 tablet by mouth daily. Daily   amitriptyline  (ELAVIL ) 10 MG tablet TAKE 1 TABLET (10 MG TOTAL) BY MOUTH AT BEDTIME FOR HEADACHE PREVENTION   calcium carbonate (OS-CAL) 600 MG TABS Take 1,200 mg by mouth 2 (two) times daily with a meal.   cholecalciferol (VITAMIN D) 1000 UNITS tablet Take 1,000 Units by mouth daily.   ciclopirox (LOPROX) 0.77 % cream Apply 1 g/mL topically 2 (two) times daily.   clotrimazole -betamethasone  (LOTRISONE ) cream APPLY TOPICALLY 2 (TWO) TIMES DAILY AS NEEDED.    diclofenac  sodium (VOLTAREN ) 1 % GEL Apply 2-4 grams to affected area up to 4 times daily   fenofibrate  micronized (LOFIBRA) 134 MG capsule TAKE 1 CAPSULE (134 MG TOTAL) BY MOUTH AT BEDTIME FOR CHOLESTEROL.   fish oil-omega-3 fatty acids 1000 MG capsule Take 2 g by mouth 2 (two) times daily.   gabapentin  (NEURONTIN ) 100 MG capsule TAKE 1 CAPSULE BY MOUTH THREE TIMES A DAY   Ginger, Zingiber officinalis, (GINGER PO) Take 1 tablet by mouth daily.   levothyroxine  (SYNTHROID ) 50 MCG tablet TAKE 1 TABLET BY MOUTH EVERY MORNING WITH WATER ON AN EMPTY STOMACH. NO OTHER FOODS/MEDS FOR 30 MINS   metoprolol  tartrate (LOPRESSOR ) 25 MG tablet TAKE 1 TAB BY MOUTH TWICE A DAY AND EXTRA TABLET AS NEEDED FOR BREAKTHROUGH PALPITATIONS AS DIRECTED   Multiple Vitamins-Minerals (MULTIVITAMIN WITH MINERALS) tablet Take 1 tablet by mouth daily.   NON FORMULARY Take 1 capsule by mouth daily. Tumeric      Daily   sulfamethoxazole -trimethoprim  (BACTRIM  DS) 800-160 MG tablet TAKE 1 TABLET (160 MG OF TRIMETHOPRIM  TOTAL) BY MOUTH 2 (TWO) TIMES DAILY   tretinoin (RETIN-A) 0.05 % cream Apply 1 application topically daily as needed (as directed per dermatologist).    omeprazole  (PRILOSEC ) 20 MG capsule TAKE 2 CAPS (40 MG TOTAL) BY MOUTH IN THE MORNING AND AT BEDTIME.   No facility-administered encounter medications on file as of 08/19/2023.    Social History: Social History   Tobacco Use   Smoking status: Never    Passive exposure: Never   Smokeless tobacco: Never  Vaping Use   Vaping status: Never Used  Substance Use Topics   Alcohol use: No   Drug use: No    Family Medical History: Family History  Problem Relation Age of Onset   GER disease Mother    Dementia Mother    Breast cancer Mother 4   Heart disease Father        MI at 7   Scoliosis Daughter    Colon cancer Neg Hx     Physical Examination: Vitals:   08/19/23 1015  BP: 120/68    General: Patient is well developed, well nourished, calm,  collected, and in no apparent distress. Attention to examination is appropriate.  Respiratory: Patient is breathing without any difficulty.   NEUROLOGICAL:     Awake, alert, oriented to person, place, and time.  Speech is clear and fluent. Fund of knowledge is appropriate.   Cranial Nerves: Pupils equal round and reactive to light.  Facial tone is symmetric.    Mild posterior cervical tenderness. Mild tenderness in bilateral trapezial region.   No abnormal lesions on exposed skin.   Strength: Side Biceps Triceps Deltoid Interossei Grip Wrist Ext. Wrist Flex.  R 5 5 5  --- --- --- ---  L 5 5 5 5 5 5 5    Side Iliopsoas Quads Hamstring PF DF EHL  R 5 5 5 5 5 5   L 5 5 5 5 5 5    Strength testing limited in right upper extremity due to splint.   Reflexes are 2+ and symmetric at the biceps, brachioradialis, patella and achilles, but brachioradialis not tested on right.   Hoffman's is absent on left, not tested on right.   Clonus is not present.    Bilateral upper and lower extremity sensation is intact to light touch.     Slow gait. She walks flexed forward.    Medical Decision Making  Imaging: CT of cervical spine dated 08/05/23:  FINDINGS: Alignment: Stable near anatomic cervical alignment. No focal angulation or significant listhesis.   Skull base and vertebrae: There is an acute nondisplaced fracture through the base of the odontoid process, best seen on the sagittal and coronal images (type 2 dens fracture) resulting in slight posterior subluxation of the left lateral atlantoaxial joint. No other evidence of acute fracture or traumatic subluxation.   Soft tissues and spinal canal: No prevertebral fluid or swelling. No visible canal hematoma. Synovial thickening around the odontoid process, similar to previous CT. No significant epidural hematoma identified.   Disc levels: Multilevel spondylosis with disc space narrowing, uncinate spurring and facet hypertrophy,  similar to previous CT. No large disc herniation or high-grade spinal stenosis demonstrated. Mild to moderate foraminal narrowing at multiple levels.   Upper chest: No acute findings.   Other: Bilateral carotid atherosclerosis.   IMPRESSION: 1. Acute nondisplaced type 2 dens fracture with slight posterior subluxation of the left lateral atlantoaxial joint. 2. No other evidence of acute cervical spine fracture, traumatic subluxation or static signs of instability. 3. Multilevel cervical spondylosis, similar to previous CT.   Electronically Signed: By: Elmon Hagedorn M.D. On: 08/05/2023 10:28  ADDENDUM REPORT: 08/05/2023 10:39   ADDENDUM: Critical Value/emergent results were called by telephone at the time of interpretation on 08/05/2023 at 10:31 am to provider Aneta Keepers, PA, who verbally acknowledged these results.     Electronically Signed   By: Elmon Hagedorn M.D.   On: 08/05/2023 10:39  I have personally reviewed the images and agree with the above interpretation.  Assessment and Plan: Ms. Mire had a fall on 07/31/23 and was found to have dens fracture.   She has only occasional neck pain with no arm pain. It has improved since injury. No numbness, tingling, or weakness. She is not wearing collar she was given in ED. She is wearing a soft collar.   She did not get updated cervical xrays.   Treatment options discussed with patient and following plan made:   - Discussed that she needs to be in hard cervical collar instead of soft collar. She had fit issues with the hard collar.   - She has appointment at [facility] today at 1:15 so they can try to get a better fit for her.  - She can remove collar to eat and sleep. No lifting with arms.  - She will get cervical xrays today after her visit.  - Will review xrays with Dr. Mont Antis and call her daughter Francetta Innocent with results.  - She will keep follow up with Dr. Mont Antis as scheduled.   I spent a total of 35  minutes in face-to-face and non-face-to-face activities related to this patient's care today including review of outside records, review of imaging, review of symptoms, physical exam, discussion  of differential diagnosis, discussion of treatment options, and documentation.   Thank you for involving me in the care of this patient.   Lucetta Russel PA-C Dept. of Neurosurgery

## 2023-08-19 ENCOUNTER — Encounter: Payer: Self-pay | Admitting: Orthopedic Surgery

## 2023-08-19 ENCOUNTER — Ambulatory Visit
Admission: RE | Admit: 2023-08-19 | Discharge: 2023-08-19 | Disposition: A | Discharge status: Home / Self Care | Attending: Orthopedic Surgery | Admitting: Orthopedic Surgery

## 2023-08-19 ENCOUNTER — Encounter: Payer: Self-pay | Admitting: Internal Medicine

## 2023-08-19 ENCOUNTER — Ambulatory Visit (INDEPENDENT_AMBULATORY_CARE_PROVIDER_SITE_OTHER): Admitting: Orthopedic Surgery

## 2023-08-19 ENCOUNTER — Ambulatory Visit
Admission: RE | Admit: 2023-08-19 | Discharge: 2023-08-19 | Disposition: A | Discharge status: Home / Self Care | Source: Ambulatory Visit | Attending: Orthopedic Surgery | Admitting: Orthopedic Surgery

## 2023-08-19 VITALS — BP 120/68 | Ht <= 58 in | Wt 155.0 lb

## 2023-08-19 DIAGNOSIS — S12100A Unspecified displaced fracture of second cervical vertebra, initial encounter for closed fracture: Secondary | ICD-10-CM

## 2023-08-19 DIAGNOSIS — S12112A Nondisplaced Type II dens fracture, initial encounter for closed fracture: Secondary | ICD-10-CM

## 2023-08-19 DIAGNOSIS — M47812 Spondylosis without myelopathy or radiculopathy, cervical region: Secondary | ICD-10-CM | POA: Diagnosis not present

## 2023-08-19 DIAGNOSIS — W19XXXA Unspecified fall, initial encounter: Secondary | ICD-10-CM | POA: Diagnosis not present

## 2023-08-19 DIAGNOSIS — M4802 Spinal stenosis, cervical region: Secondary | ICD-10-CM | POA: Diagnosis not present

## 2023-08-19 NOTE — Patient Instructions (Signed)
 It was so nice to see you today. Thank you so much for coming in.    You have a broken bone in your neck (dens/odontoid fracture). You need to be back in a hard cervical collar.   You have an appointment with [facility] for the brace today at 1:15. Their address is [address]. Their number is [phone].   Before you go to [facility], I want you to get xrays at  [facility] (building with the white pillars) off of [address]. The address is [address], [place], [place] [address]. You do not need any appointment.   I will review the xrays with Dr. Mont Antis and call your daughter with results. Should hear from me in next day or two.   You can remove collar to sleep and to eat. No lifting with your arms.   Keep your follow up with Dr. Mont Antis as scheduled.   Please do not hesitate to call if you have any questions or concerns. You can also message me in MyChart.   Lucetta Russel PA-C [phone]     The physicians and staff at [facility] Neurosurgery at [facility] are committed to providing excellent care. You may receive a survey asking for feedback about your experience at our office. We value you your feedback and appreciate you taking the time to to fill it out. The [facility] leadership team is also available to discuss your experience in person, feel free to contact us  [phone].

## 2023-08-20 ENCOUNTER — Other Ambulatory Visit: Payer: Self-pay | Admitting: Internal Medicine

## 2023-08-20 ENCOUNTER — Telehealth: Payer: Self-pay | Admitting: *Deleted

## 2023-08-20 ENCOUNTER — Inpatient Hospital Stay

## 2023-08-20 DIAGNOSIS — Z803 Family history of malignant neoplasm of breast: Secondary | ICD-10-CM | POA: Diagnosis not present

## 2023-08-20 DIAGNOSIS — D509 Iron deficiency anemia, unspecified: Secondary | ICD-10-CM | POA: Diagnosis not present

## 2023-08-20 DIAGNOSIS — M47812 Spondylosis without myelopathy or radiculopathy, cervical region: Secondary | ICD-10-CM | POA: Diagnosis not present

## 2023-08-20 DIAGNOSIS — E538 Deficiency of other specified B group vitamins: Secondary | ICD-10-CM | POA: Diagnosis not present

## 2023-08-20 DIAGNOSIS — Z791 Long term (current) use of non-steroidal anti-inflammatories (NSAID): Secondary | ICD-10-CM | POA: Diagnosis not present

## 2023-08-20 DIAGNOSIS — Z90710 Acquired absence of both cervix and uterus: Secondary | ICD-10-CM | POA: Diagnosis not present

## 2023-08-20 DIAGNOSIS — K219 Gastro-esophageal reflux disease without esophagitis: Secondary | ICD-10-CM | POA: Diagnosis not present

## 2023-08-20 DIAGNOSIS — Z860100 Personal history of colon polyps, unspecified: Secondary | ICD-10-CM | POA: Diagnosis not present

## 2023-08-20 DIAGNOSIS — I081 Rheumatic disorders of both mitral and tricuspid valves: Secondary | ICD-10-CM | POA: Diagnosis not present

## 2023-08-20 DIAGNOSIS — E785 Hyperlipidemia, unspecified: Secondary | ICD-10-CM | POA: Diagnosis not present

## 2023-08-20 DIAGNOSIS — R0602 Shortness of breath: Secondary | ICD-10-CM | POA: Diagnosis not present

## 2023-08-20 DIAGNOSIS — D649 Anemia, unspecified: Secondary | ICD-10-CM

## 2023-08-20 DIAGNOSIS — Z85820 Personal history of malignant melanoma of skin: Secondary | ICD-10-CM | POA: Diagnosis not present

## 2023-08-20 DIAGNOSIS — I6523 Occlusion and stenosis of bilateral carotid arteries: Secondary | ICD-10-CM | POA: Diagnosis not present

## 2023-08-20 DIAGNOSIS — M154 Erosive (osteo)arthritis: Secondary | ICD-10-CM | POA: Diagnosis not present

## 2023-08-20 DIAGNOSIS — S12110A Anterior displaced Type II dens fracture, initial encounter for closed fracture: Secondary | ICD-10-CM | POA: Diagnosis not present

## 2023-08-20 DIAGNOSIS — M419 Scoliosis, unspecified: Secondary | ICD-10-CM | POA: Diagnosis not present

## 2023-08-20 DIAGNOSIS — M797 Fibromyalgia: Secondary | ICD-10-CM | POA: Diagnosis not present

## 2023-08-20 DIAGNOSIS — Z79890 Hormone replacement therapy (postmenopausal): Secondary | ICD-10-CM | POA: Diagnosis not present

## 2023-08-20 DIAGNOSIS — K254 Chronic or unspecified gastric ulcer with hemorrhage: Secondary | ICD-10-CM | POA: Diagnosis not present

## 2023-08-20 DIAGNOSIS — Z79899 Other long term (current) drug therapy: Secondary | ICD-10-CM | POA: Diagnosis not present

## 2023-08-20 MED ORDER — DIPHENHYDRAMINE HCL 25 MG PO CAPS
25.0000 mg | ORAL_CAPSULE | Freq: Once | ORAL | Status: AC
  Administered 2023-08-20: 25 mg via ORAL
  Filled 2023-08-20: qty 1

## 2023-08-20 MED ORDER — ACETAMINOPHEN 325 MG PO TABS
650.0000 mg | ORAL_TABLET | Freq: Once | ORAL | Status: AC
  Administered 2023-08-20: 650 mg via ORAL
  Filled 2023-08-20: qty 2

## 2023-08-20 MED ORDER — SODIUM CHLORIDE 0.9% IV SOLUTION
250.0000 mL | INTRAVENOUS | Status: DC
  Administered 2023-08-20: 250 mL via INTRAVENOUS
  Filled 2023-08-20: qty 250

## 2023-08-20 NOTE — Telephone Encounter (Signed)
 Dr. Alana Hoyle is aware and has agreed to update the treatment plan for next week.

## 2023-08-20 NOTE — Progress Notes (Signed)
 Her insurance has changed. Ferraheme no longer covered.   Switch to venofer. With severe anemia, will do 2x per week for total 5 doses.

## 2023-08-20 NOTE — Telephone Encounter (Signed)
 Spoke with patients daughter, in regards to her mother's insurance change from [employer] and [address], and [employer] will not cover the [employer], but they will cover the Venefor iron infusions. Our conversation was cut short due to her getting a virtual phone call that she had to take, so she told me that she was going to call us  back.

## 2023-08-20 NOTE — Telephone Encounter (Signed)
 Per Monita Anon in Prior auth. Patient's new insurance is now [employer]. She had blue cross blue shield prior.  Raina Bunting will not cover fereheme infusions, even thought pt has had fereheme before (covered by blue cross).  Tiffany will you relay this information to the patient. This is the scheduling msg I just sent to the schedulers. We will need to make some adjustments to the patient's schedule next week.  attempting to reach - pt to explain.  insurance will not cover fereheme. need to switch to venofer. Dr. Alana Hoyle would like to change the treatment plan to venofer twice weekly x total 5 doses.    1. on 5/13 change this to venofer.  keep pt on schedule for possible blood on wed.5/14  2. if chair is available on Thurs 15th or Friday 16th . please add another venofer next week  3. chg to venofer on 5/20  4. please add venofer for either 5/22 or 5/23  5. chg to venofer on 5/28 when pt returns to see Randy Buttery

## 2023-08-21 ENCOUNTER — Telehealth: Payer: Self-pay | Admitting: Orthopedic Surgery

## 2023-08-21 ENCOUNTER — Other Ambulatory Visit: Payer: Self-pay | Admitting: *Deleted

## 2023-08-21 ENCOUNTER — Telehealth: Payer: Self-pay

## 2023-08-21 ENCOUNTER — Encounter: Payer: Self-pay | Admitting: Internal Medicine

## 2023-08-21 DIAGNOSIS — S12100A Unspecified displaced fracture of second cervical vertebra, initial encounter for closed fracture: Secondary | ICD-10-CM

## 2023-08-21 DIAGNOSIS — D649 Anemia, unspecified: Secondary | ICD-10-CM

## 2023-08-21 DIAGNOSIS — K922 Gastrointestinal hemorrhage, unspecified: Secondary | ICD-10-CM

## 2023-08-21 LAB — TYPE AND SCREEN
ABO/RH(D): O POS
Antibody Screen: NEGATIVE
Unit division: 0
Unit division: 0

## 2023-08-21 LAB — BPAM RBC
Blood Product Expiration Date: 202505312359
Blood Product Unit Number: 202505312359
ISSUE DATE / TIME: 202505070931
PRODUCT CODE: 202505071123
PRODUCT CODE: 202505312359
Unit Type and Rh: 5100
Unit Type and Rh: 202505312359
Unit Type and Rh: 5100
Unit Type and Rh: 5100

## 2023-08-21 NOTE — Telephone Encounter (Signed)
 Spoke with Dineen Francois, CMA at Dr. Lindsay Rho office. [facility] no longer performs the balloon enteroscopy. Pt will be referred to [facility] by Dr. Antony Baumgartner. Pt's daughter is aware.

## 2023-08-21 NOTE — Telephone Encounter (Signed)
 I did a DME prescription for lightweight WC.

## 2023-08-21 NOTE — Telephone Encounter (Addendum)
 Please call her daughter Francetta Innocent and let her know that I reviewed xrays with Dr. Mont Antis. No gross change compared to previous CT scan.   Continue with collar as we discussed. Did she get new hard collar at [facility]?   F/u with him as scheduled.  Let me know if she has questions and I can call her back.   Thanks.

## 2023-08-21 NOTE — Telephone Encounter (Signed)
 Patient called later in the day and Dr. Katrine Parody was not here yesterday.  The message from the patient was Dr. Antony Baumgartner that she can go to [facility]  or  [facility]. The patient wants [facility] and how does she get referral to go there. She wants a call back about going forward.

## 2023-08-21 NOTE — Addendum Note (Signed)
 Addended byLucetta Russel on: 08/21/2023 04:13 PM   Modules accepted: Orders

## 2023-08-21 NOTE — Telephone Encounter (Signed)
 Patient's daughter-Christina called wanting to know if we were able to fax the referral to [facility] for her mother to have a balloon-enteroscopy done. I told her that I had called [facility] hospital and they stated that they no longer do the balloon-enteroscopy there and that the closest places that do them is [facility] and the [facility]. However, I called [facility] and I was told that they did do them there. So, I told Craige Dixon that I would be sending the referral and that they would be calling them to schedule. I also provided her with their number in case she did not hear from them in a timely matter.  [facility] [phone]

## 2023-08-21 NOTE — Telephone Encounter (Signed)
 Craige Dixon indicated understanding about [name] [name]'s message.    She wanted to know if there was anyway we could help them get a Lightweight wheelchair to help with mother when out at appointments. She says that her mom is having difficulty walking far distances for appointments and her walker is hard. Craige Dixon herself has some mobility concerns so lightweight would be the best.

## 2023-08-21 NOTE — Telephone Encounter (Signed)
 Per pt daughter pt would like to go to [facility]. Pt daughter states she has been trying to call but for some reason the phone kept getting disconnected.

## 2023-08-21 NOTE — Telephone Encounter (Signed)
 Please follow up the call from 08/18/2023 with me.

## 2023-08-25 ENCOUNTER — Other Ambulatory Visit: Payer: Self-pay

## 2023-08-25 ENCOUNTER — Ambulatory Visit (INDEPENDENT_AMBULATORY_CARE_PROVIDER_SITE_OTHER): Admitting: Physician Assistant

## 2023-08-25 ENCOUNTER — Encounter: Payer: Self-pay | Admitting: Physician Assistant

## 2023-08-25 DIAGNOSIS — S62646G Nondisplaced fracture of proximal phalanx of right little finger, subsequent encounter for fracture with delayed healing: Secondary | ICD-10-CM

## 2023-08-25 DIAGNOSIS — M1712 Unilateral primary osteoarthritis, left knee: Secondary | ICD-10-CM | POA: Diagnosis not present

## 2023-08-25 DIAGNOSIS — M79641 Pain in right hand: Secondary | ICD-10-CM | POA: Diagnosis not present

## 2023-08-25 DIAGNOSIS — S62614D Displaced fracture of proximal phalanx of right ring finger, subsequent encounter for fracture with routine healing: Secondary | ICD-10-CM

## 2023-08-25 MED ORDER — LIDOCAINE HCL 1 % IJ SOLN
3.0000 mL | INTRAMUSCULAR | Status: AC | PRN
  Administered 2023-08-25: 3 mL

## 2023-08-25 MED ORDER — METHYLPREDNISOLONE ACETATE 40 MG/ML IJ SUSP
40.0000 mg | INTRAMUSCULAR | Status: AC | PRN
  Administered 2023-08-25: 40 mg via INTRA_ARTICULAR

## 2023-08-25 NOTE — Progress Notes (Signed)
 HPI: [name] returns today follow-up of her right hand 4th and 5th proximal phalanx fractures.  She has been in a ulnar gutter splint.  She feels she is overall improving.  She also would like to get a cortisone injection in her left knee which she has known osteoarthritis.  Review of systems: Denies any fevers or chills.  Physical exam: General Well-developed well-nourished female in no acute distress seated in a soft cervical collar. Left knee: Good range of motion no abnormal warmth erythema or effusion no instability valgus varus stressing. Right hand tenderness same for the proximal 4th and 5th phalanx region.  Chronic degenerative changes bilateral hands.  Radiographs right hand 3 views: 4th and 5th proximal phalanx fractures remain overall good position alignment.  Early consolidation of the 4th and 5th proximal phalanx fracture sites.  There are chronic degenerative changes throughout both hands.  Impression: 4th and 5th proximal phalanx fractures Left knee arthritis  Plan: She will continue to wear the ulnar gutter splint for another 2 weeks at all times except for hygiene purposes.  At 6 weeks post injury she can begin coming out of the splint and using the hand as tolerated.  However if she is up walking she needs to be in the splint.  Will see her back in 1 month and obtain 3 views of the right hand at that time.  In regards to the left knee per her request she was given a cortisone injection today which she tolerated.    Procedure Note  Patient: [name] [name]             Date of Birth: 10-Apr-1936           MRN: [id number]             Visit Date: 08/25/2023  Procedures: Visit Diagnoses:  1. Pain in right hand   2. Closed displaced fracture of proximal phalanx of right ring finger with routine healing, subsequent encounter   3. Closed nondisplaced fracture of proximal phalanx of right little finger with delayed healing, subsequent encounter   4. Unilateral primary  osteoarthritis, left knee     Large Joint Inj: L knee on 08/25/2023 6:11 PM Indications: pain Details: 22 G 1.5 in needle, anterolateral approach  Arthrogram: No  Medications: 3 mL lidocaine  1 %; 40 mg methylPREDNISolone  acetate 40 MG/ML Outcome: tolerated well, no immediate complications Procedure, treatment alternatives, risks and benefits explained, specific risks discussed. Consent was given by the patient. Immediately prior to procedure a time out was called to verify the correct patient, procedure, equipment, support staff and site/side marked as required. Patient was prepped and draped in the usual sterile fashion.

## 2023-08-26 ENCOUNTER — Telehealth: Payer: Self-pay | Admitting: *Deleted

## 2023-08-26 ENCOUNTER — Inpatient Hospital Stay

## 2023-08-26 ENCOUNTER — Encounter: Payer: Self-pay | Admitting: Internal Medicine

## 2023-08-26 ENCOUNTER — Other Ambulatory Visit: Payer: Self-pay | Admitting: Oncology

## 2023-08-26 VITALS — BP 113/88 | HR 72 | Temp 97.3°F | Resp 16

## 2023-08-26 DIAGNOSIS — S12110A Anterior displaced Type II dens fracture, initial encounter for closed fracture: Secondary | ICD-10-CM | POA: Diagnosis not present

## 2023-08-26 DIAGNOSIS — M797 Fibromyalgia: Secondary | ICD-10-CM | POA: Diagnosis not present

## 2023-08-26 DIAGNOSIS — D649 Anemia, unspecified: Secondary | ICD-10-CM

## 2023-08-26 DIAGNOSIS — Z803 Family history of malignant neoplasm of breast: Secondary | ICD-10-CM | POA: Diagnosis not present

## 2023-08-26 DIAGNOSIS — Z79899 Other long term (current) drug therapy: Secondary | ICD-10-CM | POA: Diagnosis not present

## 2023-08-26 DIAGNOSIS — Z85820 Personal history of malignant melanoma of skin: Secondary | ICD-10-CM | POA: Diagnosis not present

## 2023-08-26 DIAGNOSIS — R0602 Shortness of breath: Secondary | ICD-10-CM | POA: Diagnosis not present

## 2023-08-26 DIAGNOSIS — M47812 Spondylosis without myelopathy or radiculopathy, cervical region: Secondary | ICD-10-CM | POA: Diagnosis not present

## 2023-08-26 DIAGNOSIS — Z90710 Acquired absence of both cervix and uterus: Secondary | ICD-10-CM | POA: Diagnosis not present

## 2023-08-26 DIAGNOSIS — Z860100 Personal history of colon polyps, unspecified: Secondary | ICD-10-CM | POA: Diagnosis not present

## 2023-08-26 DIAGNOSIS — K254 Chronic or unspecified gastric ulcer with hemorrhage: Secondary | ICD-10-CM | POA: Diagnosis not present

## 2023-08-26 DIAGNOSIS — Z791 Long term (current) use of non-steroidal anti-inflammatories (NSAID): Secondary | ICD-10-CM | POA: Diagnosis not present

## 2023-08-26 DIAGNOSIS — E538 Deficiency of other specified B group vitamins: Secondary | ICD-10-CM | POA: Diagnosis not present

## 2023-08-26 DIAGNOSIS — I6523 Occlusion and stenosis of bilateral carotid arteries: Secondary | ICD-10-CM | POA: Diagnosis not present

## 2023-08-26 DIAGNOSIS — M419 Scoliosis, unspecified: Secondary | ICD-10-CM | POA: Diagnosis not present

## 2023-08-26 DIAGNOSIS — D509 Iron deficiency anemia, unspecified: Secondary | ICD-10-CM | POA: Diagnosis not present

## 2023-08-26 DIAGNOSIS — M154 Erosive (osteo)arthritis: Secondary | ICD-10-CM | POA: Diagnosis not present

## 2023-08-26 DIAGNOSIS — Z79890 Hormone replacement therapy (postmenopausal): Secondary | ICD-10-CM | POA: Diagnosis not present

## 2023-08-26 DIAGNOSIS — E785 Hyperlipidemia, unspecified: Secondary | ICD-10-CM | POA: Diagnosis not present

## 2023-08-26 DIAGNOSIS — I081 Rheumatic disorders of both mitral and tricuspid valves: Secondary | ICD-10-CM | POA: Diagnosis not present

## 2023-08-26 DIAGNOSIS — K219 Gastro-esophageal reflux disease without esophagitis: Secondary | ICD-10-CM | POA: Diagnosis not present

## 2023-08-26 LAB — CBC WITH DIFFERENTIAL (CANCER CENTER ONLY)
Abs Immature Granulocytes: 0.02 10*3/uL (ref 0.00–0.07)
Basophils Absolute: 0 10*3/uL (ref 0.0–0.1)
Basophils Relative: 0 %
Eosinophils Absolute: 0.1 10*3/uL (ref 0.0–0.5)
Eosinophils Relative: 1 %
HCT: 33.9 % — ABNORMAL LOW (ref 36.0–46.0)
Hemoglobin: 10.4 g/dL — ABNORMAL LOW (ref 12.0–15.0)
Immature Granulocytes: 0 %
Lymphocytes Relative: 18 %
Lymphs Abs: 1 10*3/uL (ref 0.7–4.0)
MCH: 29.4 pg (ref 26.0–34.0)
MCHC: 30.7 g/dL (ref 30.0–36.0)
MCV: 95.8 fL (ref 80.0–100.0)
Monocytes Absolute: 0.5 10*3/uL (ref 0.1–1.0)
Monocytes Relative: 9 %
Neutro Abs: 3.9 10*3/uL (ref 1.7–7.7)
Neutrophils Relative %: 72 %
Platelet Count: 243 10*3/uL (ref 150–400)
RBC: 3.54 MIL/uL — ABNORMAL LOW (ref 3.87–5.11)
RDW: 17.4 % — ABNORMAL HIGH (ref 11.5–15.5)
WBC Count: 5.5 10*3/uL (ref 4.0–10.5)
nRBC: 0 % (ref 0.0–0.2)

## 2023-08-26 LAB — SAMPLE TO BLOOD BANK

## 2023-08-26 MED ORDER — SODIUM CHLORIDE 0.9 % IV SOLN
INTRAVENOUS | Status: DC
Start: 2023-08-26 — End: 2023-08-26
  Filled 2023-08-26: qty 250

## 2023-08-26 MED ORDER — SODIUM CHLORIDE 0.9 % IV SOLN
510.0000 mg | INTRAVENOUS | Status: DC
  Administered 2023-08-26: 510 mg via INTRAVENOUS
  Filled 2023-08-26: qty 17

## 2023-08-26 NOTE — Patient Instructions (Signed)

## 2023-08-26 NOTE — Telephone Encounter (Signed)
 Spoke with patient. She does not need blood transfusion tomorrow. I informed patient. She thanked me for updating her.

## 2023-08-27 ENCOUNTER — Inpatient Hospital Stay

## 2023-08-27 ENCOUNTER — Telehealth: Payer: Self-pay | Admitting: Orthopedic Surgery

## 2023-08-27 ENCOUNTER — Ambulatory Visit: Admitting: Physician Assistant

## 2023-08-27 NOTE — Telephone Encounter (Signed)
 Spoke with daughter. She will be consulting with her mother and give us  a call back.

## 2023-08-27 NOTE — Telephone Encounter (Signed)
 Please let her daughter know that insurance is not wanting to cover lightweight WC.   I may be able to get it covered, but she will need to see me for in the office to fill out the paperwork- they require a face to face visit.   Other option is for her to get one OTC.   Let me know if she has questions.

## 2023-08-28 ENCOUNTER — Ambulatory Visit

## 2023-09-01 ENCOUNTER — Ambulatory Visit: Admitting: Student in an Organized Health Care Education/Training Program

## 2023-09-02 ENCOUNTER — Inpatient Hospital Stay

## 2023-09-02 VITALS — BP 116/59 | HR 79 | Temp 97.3°F | Resp 18

## 2023-09-02 DIAGNOSIS — I081 Rheumatic disorders of both mitral and tricuspid valves: Secondary | ICD-10-CM | POA: Diagnosis not present

## 2023-09-02 DIAGNOSIS — Z791 Long term (current) use of non-steroidal anti-inflammatories (NSAID): Secondary | ICD-10-CM | POA: Diagnosis not present

## 2023-09-02 DIAGNOSIS — Z85820 Personal history of malignant melanoma of skin: Secondary | ICD-10-CM | POA: Diagnosis not present

## 2023-09-02 DIAGNOSIS — D509 Iron deficiency anemia, unspecified: Secondary | ICD-10-CM | POA: Diagnosis not present

## 2023-09-02 DIAGNOSIS — M154 Erosive (osteo)arthritis: Secondary | ICD-10-CM | POA: Diagnosis not present

## 2023-09-02 DIAGNOSIS — S12110A Anterior displaced Type II dens fracture, initial encounter for closed fracture: Secondary | ICD-10-CM | POA: Diagnosis not present

## 2023-09-02 DIAGNOSIS — K254 Chronic or unspecified gastric ulcer with hemorrhage: Secondary | ICD-10-CM | POA: Diagnosis not present

## 2023-09-02 DIAGNOSIS — I6523 Occlusion and stenosis of bilateral carotid arteries: Secondary | ICD-10-CM | POA: Diagnosis not present

## 2023-09-02 DIAGNOSIS — E785 Hyperlipidemia, unspecified: Secondary | ICD-10-CM | POA: Diagnosis not present

## 2023-09-02 DIAGNOSIS — Z79890 Hormone replacement therapy (postmenopausal): Secondary | ICD-10-CM | POA: Diagnosis not present

## 2023-09-02 DIAGNOSIS — M47812 Spondylosis without myelopathy or radiculopathy, cervical region: Secondary | ICD-10-CM | POA: Diagnosis not present

## 2023-09-02 DIAGNOSIS — Z860100 Personal history of colon polyps, unspecified: Secondary | ICD-10-CM | POA: Diagnosis not present

## 2023-09-02 DIAGNOSIS — Z90710 Acquired absence of both cervix and uterus: Secondary | ICD-10-CM | POA: Diagnosis not present

## 2023-09-02 DIAGNOSIS — M797 Fibromyalgia: Secondary | ICD-10-CM | POA: Diagnosis not present

## 2023-09-02 DIAGNOSIS — Z803 Family history of malignant neoplasm of breast: Secondary | ICD-10-CM | POA: Diagnosis not present

## 2023-09-02 DIAGNOSIS — Z79899 Other long term (current) drug therapy: Secondary | ICD-10-CM | POA: Diagnosis not present

## 2023-09-02 DIAGNOSIS — E538 Deficiency of other specified B group vitamins: Secondary | ICD-10-CM | POA: Diagnosis not present

## 2023-09-02 DIAGNOSIS — R0602 Shortness of breath: Secondary | ICD-10-CM | POA: Diagnosis not present

## 2023-09-02 DIAGNOSIS — K219 Gastro-esophageal reflux disease without esophagitis: Secondary | ICD-10-CM | POA: Diagnosis not present

## 2023-09-02 DIAGNOSIS — M419 Scoliosis, unspecified: Secondary | ICD-10-CM | POA: Diagnosis not present

## 2023-09-02 DIAGNOSIS — D649 Anemia, unspecified: Secondary | ICD-10-CM

## 2023-09-02 MED ORDER — SODIUM CHLORIDE 0.9 % IV SOLN
510.0000 mg | INTRAVENOUS | Status: DC
  Administered 2023-09-02: 510 mg via INTRAVENOUS
  Filled 2023-09-02: qty 510

## 2023-09-02 MED ORDER — SODIUM CHLORIDE 0.9 % IV SOLN
INTRAVENOUS | Status: DC | PRN
  Filled 2023-09-02: qty 250

## 2023-09-04 ENCOUNTER — Ambulatory Visit

## 2023-09-05 NOTE — Telephone Encounter (Signed)
 Today we received a letter from [facility] stating that the patient declined the evaluation for the deep enteroscopy at this time. Letter will be in patient's chart.

## 2023-09-06 NOTE — Progress Notes (Deleted)
 Cardiology Clinic Note   Date: 09/06/2023 ID: [name] [name], DOB December 01, 1935, MRN [id number]  Primary Cardiologist:  Belva Boyden, MD  Chief Complaint   [name] [name] is a 88 y.o. female who presents to the clinic today for ***  Patient Profile   [name] [name] is followed by *** for the history outlined below.       Past medical history significant for: Mitral valve regurgitation. Echo 01/14/2022: EF 55 to 60%.  No RWMA.  Normal diastolic parameters.  Normal RV size/function.  Mitral valve is normal in structure.  Mild to moderate MR.  No mitral valve stenosis.*** Echo 07/28/2023: EF 55 to 60%.  No RWMA.  Grade I DD.  Normal RV size/function.  Mild MR. Hyperlipidemia. Lipid panel 10/15/2022: LDL 67, HDL 49, TG 181, total 152. Palpitations. Iron deficiency anemia. Hiatal hernia. Fibromyalgia. RA.  In summary, patient has a history of stable mild to moderate mitral valve regurgitation.  Last echo October 2023 as detailed above. She has a long history of palpitations well controlled on metoprolol .   Patient was seen in the office by Dr. Gollan on 05/20/2022 for routine follow-up.  She complained of fatigue and dyspnea.  She had recently been diagnosed with iron deficiency anemia and PCP attributed symptoms to that.  She had an echo in October 2023 as above.   Patient was last seen in the office by me on 06/09/2023 for routine follow-up.  She reported increased dyspnea with activities around the home.  She was undergoing a full workup for anemia including bone biopsy.  She was undergoing iron infusions.  She was also pending a visit to pulmonology.  She attributed dyspnea and fatigue to anemia.  She reported occasional palpitations described as heart racing that she felt were well-controlled with metoprolol .     History of Present Illness    Today, patient ***  EKG changes EKG today demonstrate NSR 91 bpm, LBBB, LAFB changed from prior EKG 05/20/2022.***Patient denies chest pain, pressure  or tightness. She is experiencing dyspnea with less exertion than usual. She attributes this to anemia.  - Schedule PET/CT***   Mitral valve regurgitation Echo April 2025 showed normal LV/RV function, Grade I DD, mild  MR.  Patient*** - Repeat echo when clinically indicated.  Palpitations Patient reports palpitations described as heart racing are unchanged from previous. EKG today*** -Continue metoprolol .   Mixed hyperlipidemia/statin intolerance Lipid panel July 2024 LDL 67, TG 181.  Patient with history of myalgias on statins.  She was unable to tolerate Zetia secondary to diarrhea.  PCP started fenofibrate  in August 2024.*** -Continue fenofibrate .  ROS: All other systems reviewed and are otherwise negative except as noted in History of Present Illness.  EKGs/Labs Reviewed        06/09/2023: ALT 17; AST 21; BUN 29; Creatinine 1.03; Potassium 3.9; Sodium 139   08/26/2023: Hemoglobin 10.4; WBC Count 5.5   10/15/2022: TSH 3.55   No results found for requested labs within last 365 days.  ***  Risk Assessment/Calculations    {Does this patient have ATRIAL FIBRILLATION?:[phone]} No BP recorded.  {Refresh Note OR Click here to enter BP  :1}***        Physical Exam    VS:  There were no vitals taken for this visit. , BMI There is no height or weight on file to calculate BMI.  GEN: Well nourished, well developed, in no acute distress. Neck: No JVD or carotid bruits. Cardiac: *** RRR. No murmurs. No rubs or  gallops.   Respiratory:  Respirations regular and unlabored. Clear to auscultation without rales, wheezing or rhonchi. GI: Soft, nontender, nondistended. Extremities: Radials/DP/PT 2+ and equal bilaterally. No clubbing or cyanosis. No edema ***  Skin: Warm and dry, no rash. Neuro: Strength intact.  Assessment & Plan   ***  Disposition: ***     {Are you ordering a CV Procedure (e.g. stress test, cath, DCCV, TEE, etc)?   Press F2        :[id number]}   Signed, Lonell Rives. Laksh Hinners, DNP, NP-C

## 2023-09-07 ENCOUNTER — Other Ambulatory Visit: Payer: Self-pay | Admitting: Primary Care

## 2023-09-07 DIAGNOSIS — E039 Hypothyroidism, unspecified: Secondary | ICD-10-CM

## 2023-09-07 DIAGNOSIS — R519 Headache, unspecified: Secondary | ICD-10-CM

## 2023-09-09 ENCOUNTER — Ambulatory Visit: Payer: Medicare Other | Admitting: Student

## 2023-09-09 ENCOUNTER — Other Ambulatory Visit: Payer: Self-pay

## 2023-09-09 DIAGNOSIS — D649 Anemia, unspecified: Secondary | ICD-10-CM

## 2023-09-09 DIAGNOSIS — S12100A Unspecified displaced fracture of second cervical vertebra, initial encounter for closed fracture: Secondary | ICD-10-CM | POA: Diagnosis not present

## 2023-09-10 ENCOUNTER — Inpatient Hospital Stay

## 2023-09-10 ENCOUNTER — Encounter: Payer: Self-pay | Admitting: Oncology

## 2023-09-10 ENCOUNTER — Other Ambulatory Visit: Payer: Self-pay | Admitting: Primary Care

## 2023-09-10 ENCOUNTER — Ambulatory Visit: Payer: Medicare Other | Admitting: Internal Medicine

## 2023-09-10 ENCOUNTER — Ambulatory Visit: Payer: Medicare Other

## 2023-09-10 ENCOUNTER — Inpatient Hospital Stay (HOSPITAL_BASED_OUTPATIENT_CLINIC_OR_DEPARTMENT_OTHER): Admitting: Oncology

## 2023-09-10 ENCOUNTER — Other Ambulatory Visit: Payer: Medicare Other

## 2023-09-10 VITALS — BP 149/58 | HR 69

## 2023-09-10 DIAGNOSIS — K219 Gastro-esophageal reflux disease without esophagitis: Secondary | ICD-10-CM | POA: Diagnosis not present

## 2023-09-10 DIAGNOSIS — D649 Anemia, unspecified: Secondary | ICD-10-CM

## 2023-09-10 DIAGNOSIS — Z860100 Personal history of colon polyps, unspecified: Secondary | ICD-10-CM | POA: Diagnosis not present

## 2023-09-10 DIAGNOSIS — E538 Deficiency of other specified B group vitamins: Secondary | ICD-10-CM | POA: Diagnosis not present

## 2023-09-10 DIAGNOSIS — Z79899 Other long term (current) drug therapy: Secondary | ICD-10-CM | POA: Diagnosis not present

## 2023-09-10 DIAGNOSIS — I6523 Occlusion and stenosis of bilateral carotid arteries: Secondary | ICD-10-CM | POA: Diagnosis not present

## 2023-09-10 DIAGNOSIS — I081 Rheumatic disorders of both mitral and tricuspid valves: Secondary | ICD-10-CM | POA: Diagnosis not present

## 2023-09-10 DIAGNOSIS — S12110A Anterior displaced Type II dens fracture, initial encounter for closed fracture: Secondary | ICD-10-CM | POA: Diagnosis not present

## 2023-09-10 DIAGNOSIS — K254 Chronic or unspecified gastric ulcer with hemorrhage: Secondary | ICD-10-CM | POA: Diagnosis not present

## 2023-09-10 DIAGNOSIS — E785 Hyperlipidemia, unspecified: Secondary | ICD-10-CM | POA: Diagnosis not present

## 2023-09-10 DIAGNOSIS — Z85820 Personal history of malignant melanoma of skin: Secondary | ICD-10-CM | POA: Diagnosis not present

## 2023-09-10 DIAGNOSIS — M47812 Spondylosis without myelopathy or radiculopathy, cervical region: Secondary | ICD-10-CM | POA: Diagnosis not present

## 2023-09-10 DIAGNOSIS — R0602 Shortness of breath: Secondary | ICD-10-CM | POA: Diagnosis not present

## 2023-09-10 DIAGNOSIS — Z90710 Acquired absence of both cervix and uterus: Secondary | ICD-10-CM | POA: Diagnosis not present

## 2023-09-10 DIAGNOSIS — R519 Headache, unspecified: Secondary | ICD-10-CM

## 2023-09-10 DIAGNOSIS — D509 Iron deficiency anemia, unspecified: Secondary | ICD-10-CM | POA: Diagnosis not present

## 2023-09-10 DIAGNOSIS — M154 Erosive (osteo)arthritis: Secondary | ICD-10-CM | POA: Diagnosis not present

## 2023-09-10 DIAGNOSIS — Z803 Family history of malignant neoplasm of breast: Secondary | ICD-10-CM | POA: Diagnosis not present

## 2023-09-10 DIAGNOSIS — M797 Fibromyalgia: Secondary | ICD-10-CM | POA: Diagnosis not present

## 2023-09-10 DIAGNOSIS — D508 Other iron deficiency anemias: Secondary | ICD-10-CM

## 2023-09-10 DIAGNOSIS — Z791 Long term (current) use of non-steroidal anti-inflammatories (NSAID): Secondary | ICD-10-CM | POA: Diagnosis not present

## 2023-09-10 DIAGNOSIS — M419 Scoliosis, unspecified: Secondary | ICD-10-CM | POA: Diagnosis not present

## 2023-09-10 DIAGNOSIS — Z79890 Hormone replacement therapy (postmenopausal): Secondary | ICD-10-CM | POA: Diagnosis not present

## 2023-09-10 LAB — CBC WITH DIFFERENTIAL (CANCER CENTER ONLY)
Abs Immature Granulocytes: 0.02 10*3/uL (ref 0.00–0.07)
Basophils Absolute: 0 10*3/uL (ref 0.0–0.1)
Basophils Relative: 1 %
Eosinophils Absolute: 0.1 10*3/uL (ref 0.0–0.5)
Eosinophils Relative: 3 %
HCT: 35.7 % — ABNORMAL LOW (ref 36.0–46.0)
Hemoglobin: 11.3 g/dL — ABNORMAL LOW (ref 12.0–15.0)
Immature Granulocytes: 1 %
Lymphocytes Relative: 27 %
Lymphs Abs: 1.1 10*3/uL (ref 0.7–4.0)
MCH: 30 pg (ref 26.0–34.0)
MCHC: 31.7 g/dL (ref 30.0–36.0)
MCV: 94.7 fL (ref 80.0–100.0)
Monocytes Absolute: 0.3 10*3/uL (ref 0.1–1.0)
Monocytes Relative: 8 %
Neutro Abs: 2.5 10*3/uL (ref 1.7–7.7)
Neutrophils Relative %: 60 %
Platelet Count: 175 10*3/uL (ref 150–400)
RBC: 3.77 MIL/uL — ABNORMAL LOW (ref 3.87–5.11)
RDW: 15.4 % (ref 11.5–15.5)
WBC Count: 4 10*3/uL (ref 4.0–10.5)
nRBC: 0 % (ref 0.0–0.2)

## 2023-09-10 LAB — CMP (CANCER CENTER ONLY)
ALT: 22 U/L (ref 0–44)
AST: 29 U/L (ref 15–41)
Albumin: 4.2 g/dL (ref 3.5–5.0)
Alkaline Phosphatase: 57 U/L (ref 38–126)
Anion gap: 7 (ref 5–15)
BUN: 26 mg/dL — ABNORMAL HIGH (ref 8–23)
CO2: 26 mmol/L (ref 22–32)
Calcium: 8.5 mg/dL — ABNORMAL LOW (ref 8.9–10.3)
Chloride: 104 mmol/L (ref 98–111)
Creatinine: 0.76 mg/dL (ref 0.44–1.00)
GFR, Estimated: >60 mL/min (ref >60)
Glucose, Bld: 142 mg/dL — ABNORMAL HIGH (ref 70–99)
Potassium: 4.3 mmol/L (ref 3.5–5.1)
Sodium: 137 mmol/L (ref 135–145)
Total Bilirubin: 0.6 mg/dL (ref 0.0–1.2)
Total Protein: 6 g/dL — ABNORMAL LOW (ref 6.5–8.1)

## 2023-09-10 LAB — SAMPLE TO BLOOD BANK

## 2023-09-10 LAB — IRON AND TIBC
Iron: 122 ug/dL (ref 28–170)
Saturation Ratios: 26 % (ref 10.4–31.8)
TIBC: 470 ug/dL — ABNORMAL HIGH (ref 250–450)
UIBC: 348 ug/dL

## 2023-09-10 LAB — FERRITIN: Ferritin: 778 ng/mL — ABNORMAL HIGH (ref 11–307)

## 2023-09-10 MED ORDER — SODIUM CHLORIDE 0.9 % IV SOLN
510.0000 mg | Freq: Once | INTRAVENOUS | Status: AC
  Administered 2023-09-10: 510 mg via INTRAVENOUS
  Filled 2023-09-10: qty 510

## 2023-09-10 MED ORDER — SODIUM CHLORIDE 0.9 % IV SOLN
Freq: Once | INTRAVENOUS | Status: AC
  Filled 2023-09-10: qty 250

## 2023-09-10 NOTE — Telephone Encounter (Signed)
 Please call patient:  We keep receiving a refill request for amitriptyline  from [facility].  It looks like she should have enough to last until early July.  This is the case?  Or is she running low?

## 2023-09-10 NOTE — Telephone Encounter (Signed)
 Unable to reach patient. Left voicemail to return call to our office.

## 2023-09-11 ENCOUNTER — Ambulatory Visit: Payer: Self-pay | Admitting: Oncology

## 2023-09-11 NOTE — Telephone Encounter (Signed)
 Contacted patient, she does not need a refill on that at this time. There was an issue with it being in stock at [facility] and something got confused. They gave her what they had in stock and will fill the remaining pills once its back in stock.

## 2023-09-14 ENCOUNTER — Encounter: Payer: Self-pay | Admitting: Oncology

## 2023-09-14 NOTE — Progress Notes (Signed)
 Hematology/Oncology Consult note [facility]  Telephone:([phone] [phone]  Patient Care Team: Gabriel John, NP as PCP - General (Internal Medicine) Devorah Fonder, MD as PCP - Cardiology (Cardiology) Avonne Boettcher, MD as Consulting Physician (Oncology)   Name of the patient: [name] [name]  [id number]  [birth year]   Date of visit: 09/14/23  Diagnosis-iron deficiency anemia  Chief complaint/ Reason for visit-routine follow-up of iron deficiency anemia  Heme/Onc history: Patient is a 88 year old female who was transferring her care from Dr. Katrine Parody for history of iron deficiency anemia.  Her past medical history significant for hypertension hyperlipidemia GERD anxiety fibromyalgia and osteoarthritis.  She has received Feraheme  in the past.  She had colonoscopy with Dr. Antony Baumgartner in April 2024 which showed colonic polyps that were consistent with tubular adenoma.  Upper endoscopy showed 2 superficial nonbleeding gastric ulcers for which she is on Prilosec .  Capsule endoscopy in July 2024 showed minimal blood-tinged material for a few seconds.  If she were to continue to have evidence of iron deficiency anemia consideration would need to be given for balloon enteroscopy.  Patient also had bone marrow biopsy in February 2025 which showed hypercellular bone marrow with orderly trilineage hematopoiesis and no evidence of dysplasia.  Interval history-patient has a cervical collar in place for dens fracture which was found in April 2025.  ECOG PS- 2 Pain scale- 0   Review of systems- Review of Systems  Constitutional:  Negative for chills, fever, malaise/fatigue and weight loss.  HENT:  Negative for congestion, ear discharge and nosebleeds.   Eyes:  Negative for blurred vision.  Respiratory:  Negative for cough, hemoptysis, sputum production, shortness of breath and wheezing.   Cardiovascular:  Negative for chest pain, palpitations, orthopnea and  claudication.  Gastrointestinal:  Negative for abdominal pain, blood in stool, constipation, diarrhea, heartburn, melena, nausea and vomiting.  Genitourinary:  Negative for dysuria, flank pain, frequency, hematuria and urgency.  Musculoskeletal:  Positive for neck pain. Negative for back pain, joint pain and myalgias.  Skin:  Negative for rash.  Neurological:  Negative for dizziness, tingling, focal weakness, seizures, weakness and headaches.  Endo/Heme/Allergies:  Does not bruise/bleed easily.  Psychiatric/Behavioral:  Negative for depression and suicidal ideas. The patient does not have insomnia.       Allergies  Allergen Reactions   Other Other (See Comments)    Other Reaction: when taking for an extended period of time causes mouth ulcers and esophagus irritation Other Reaction: when taking for an extended period of time causes mouth ulcers and esophagus irritation    Fenofibric Acid Other (See Comments)    GI upset   Nsaids     REACTION: No long term use   Statins Other (See Comments)    Intolerant, myalgias Intolerant, myalgias Intolerant, myalgias Intolerant, myalgias Intolerant, myalgias Intolerant, myalgias    Trilipix [Choline Fenofibrate ]     GI upset   Wasp Venom Swelling     Past Medical History:  Diagnosis Date   Anxiety    prev on prozac   Chest pain    Felt to be noncardiac in the past   Chronic rectal fissure    Colon polyp    Dyslipidemia    Fibromyalgia    GERD (gastroesophageal reflux disease)    Barrett's esophagus   Hiatal hernia    Lumbar spine pain    Complex lumbar spine surgery at [facility], 2012, complicated, MRSA, much of the appliance is removed, patient on chronic  antibiotics   Melanoma (HCC)    Mitral regurgitation    a. 2007 Echo Mild MR; b. 10/2016 Echo: EF 55-60%, no rwma, GrI DD, Mild MR.   Nausea and vomiting 08/22/2021   Osteoarthritis    Scoliosis    Sinusitis    Multiple sinus operations in the past   Statin intolerance    As  of 2009, no further attempts to use statins   Thyroid  nodule      Past Surgical History:  Procedure Laterality Date   ABDOMINAL HYSTERECTOMY     BACK SURGERY     CHOLECYSTECTOMY     COLONOSCOPY WITH PROPOFOL  N/A 08/07/2022   Procedure: COLONOSCOPY WITH PROPOFOL ;  Surgeon: Luke Salaam, MD;  Location: [facility] ENDOSCOPY;  Service: Gastroenterology;  Laterality: N/A;   ESOPHAGOGASTRODUODENOSCOPY (EGD) WITH PROPOFOL  N/A 08/07/2022   Procedure: ESOPHAGOGASTRODUODENOSCOPY (EGD) WITH PROPOFOL ;  Surgeon: Luke Salaam, MD;  Location: [facility] ENDOSCOPY;  Service: Gastroenterology;  Laterality: N/A;   FOOT SURGERY     GIVENS CAPSULE STUDY N/A 10/09/2022   Procedure: GIVENS CAPSULE STUDY;  Surgeon: Luke Salaam, MD;  Location: [facility] ENDOSCOPY;  Service: Gastroenterology;  Laterality: N/A;   HERNIA REPAIR     IR BONE MARROW BIOPSY & ASPIRATION  06/02/2023   KNEE SURGERY Right 08/08/2021   total knee replacement   NASAL SINUS SURGERY      Social History   Socioeconomic History   Marital status: Married    Spouse name: Not on file   Number of children: Not on file   Years of education: Not on file   Highest education level: Not on file  Occupational History   Not on file  Tobacco Use   Smoking status: Never    Passive exposure: Never   Smokeless tobacco: Never  Vaping Use   Vaping status: Never Used  Substance and Sexual Activity   Alcohol use: No   Drug use: No   Sexual activity: Not on file  Other Topics Concern   Not on file  Social History Narrative   From [place]   Married (second [phone]   3 kids, 2 of whom are local   Retired, Tree surgeon   Social Drivers of Health   Financial Resource Strain: Low Risk  (10/07/2022)   Overall Financial Resource Strain (CARDIA)    Difficulty of Paying Living Expenses: Not hard at all  Food Insecurity: No Food Insecurity (10/07/2022)   Hunger Vital Sign    Worried About Running Out of Food in the Last Year: Never true    Ran Out of  Food in the Last Year: Never true  Transportation Needs: No Transportation Needs (10/07/2022)   PRAPARE - Administrator, Civil Service (Medical): No    Lack of Transportation (Non-Medical): No  Physical Activity: Sufficiently Active (10/07/2022)   Exercise Vital Sign    Days of Exercise per Week: 3 days    Minutes of Exercise per Session: 50 min  Stress: No Stress Concern Present (10/07/2022)   [employer] of Occupational Health - Occupational Stress Questionnaire    Feeling of Stress : Not at all  Social Connections: Moderately Isolated (10/07/2022)   Social Connection and Isolation Panel [NHANES]    Frequency of Communication with Friends and Family: More than three times a week    Frequency of Social Gatherings with Friends and Family: More than three times a week    Attends Religious Services: Never    Database administrator or Organizations: No  Attends Banker Meetings: Never    Marital Status: Married  Catering manager Violence: Not At Risk (10/07/2022)   Humiliation, Afraid, Rape, and Kick questionnaire    Fear of Current or Ex-Partner: No    Emotionally Abused: No    Physically Abused: No    Sexually Abused: No    Family History  Problem Relation Age of Onset   GER disease Mother    Dementia Mother    Breast cancer Mother 54   Heart disease Father        MI at 76   Scoliosis Daughter    Colon cancer Neg Hx      Current Outpatient Medications:    ALPHA LIPOIC ACID PO, Take 1 tablet by mouth daily. Daily, Disp: , Rfl:    amitriptyline  (ELAVIL ) 10 MG tablet, TAKE 1 TABLET (10 MG TOTAL) BY MOUTH AT BEDTIME FOR HEADACHE PREVENTION, Disp: 90 tablet, Rfl: 2   calcium carbonate (OS-CAL) 600 MG TABS, Take 1,200 mg by mouth 2 (two) times daily with a meal., Disp: , Rfl:    cholecalciferol (VITAMIN D) 1000 UNITS tablet, Take 1,000 Units by mouth daily., Disp: , Rfl:    ciclopirox (LOPROX) 0.77 % cream, Apply 1 g/mL topically 2 (two) times daily.,  Disp: , Rfl:    clotrimazole -betamethasone  (LOTRISONE ) cream, APPLY TOPICALLY 2 (TWO) TIMES DAILY AS NEEDED., Disp: 30 g, Rfl: 0   diclofenac  sodium (VOLTAREN ) 1 % GEL, Apply 2-4 grams to affected area up to 4 times daily, Disp: 4 Tube, Rfl: 2   fenofibrate  micronized (LOFIBRA) 134 MG capsule, TAKE 1 CAPSULE (134 MG TOTAL) BY MOUTH AT BEDTIME FOR CHOLESTEROL., Disp: 90 capsule, Rfl: 2   fish oil-omega-3 fatty acids 1000 MG capsule, Take 2 g by mouth 2 (two) times daily., Disp: , Rfl:    gabapentin  (NEURONTIN ) 100 MG capsule, TAKE 1 CAPSULE BY MOUTH THREE TIMES A DAY, Disp: 270 capsule, Rfl: 0   Ginger, Zingiber officinalis, (GINGER PO), Take 1 tablet by mouth daily., Disp: , Rfl:    levothyroxine  (SYNTHROID ) 50 MCG tablet, TAKE 1 TABLET BY MOUTH EVERY MORNING WITH WATER ON AN EMPTY STOMACH. NO OTHER FOODS/MEDS FOR 30 MINS, Disp: 90 tablet, Rfl: 0   metoprolol  tartrate (LOPRESSOR ) 25 MG tablet, TAKE 1 TAB BY MOUTH TWICE A DAY AND EXTRA TABLET AS NEEDED FOR BREAKTHROUGH PALPITATIONS AS DIRECTED, Disp: 180 tablet, Rfl: 3   Multiple Vitamins-Minerals (MULTIVITAMIN WITH MINERALS) tablet, Take 1 tablet by mouth daily., Disp: , Rfl:    NON FORMULARY, Take 1 capsule by mouth daily. Tumeric      Daily, Disp: , Rfl:    omeprazole  (PRILOSEC ) 20 MG capsule, TAKE 2 CAPS (40 MG TOTAL) BY MOUTH IN THE MORNING AND AT BEDTIME., Disp: 360 capsule, Rfl: 0   sulfamethoxazole -trimethoprim  (BACTRIM  DS) 800-160 MG tablet, TAKE 1 TABLET (160 MG OF TRIMETHOPRIM  TOTAL) BY MOUTH 2 (TWO) TIMES DAILY, Disp: 180 tablet, Rfl: 3   tretinoin (RETIN-A) 0.05 % cream, Apply 1 application topically daily as needed (as directed per dermatologist). , Disp: , Rfl: 3  Physical exam:  Vitals:   09/10/23 1524  BP: (!) 127/55  Pulse: 72  Resp: 19  Temp: 98.8 F (37.1 C)  TempSrc: Tympanic  SpO2: 96%  Weight: 155 lb 8 oz (70.5 kg)  Height: 4\' 8"  (1.422 m)   Physical Exam Cardiovascular:     Rate and Rhythm: Normal rate and regular  rhythm.     Heart sounds: Normal heart sounds.  Pulmonary:  Effort: Pulmonary effort is normal.     Breath sounds: Normal breath sounds.  Skin:    General: Skin is warm and dry.  Neurological:     Mental Status: She is alert and oriented to person, place, and time.      I have personally reviewed labs listed below:    Latest Ref Rng & Units 09/10/2023    2:57 PM  CMP  Glucose 70 - 99 mg/dL 829   BUN 8 - 23 mg/dL 26   Creatinine 5.62 - 1.00 mg/dL 1.30   Sodium 865 - 784 mmol/L 137   Potassium 3.5 - 5.1 mmol/L 4.3   Chloride 98 - 111 mmol/L 104   CO2 22 - 32 mmol/L 26   Calcium 8.9 - 10.3 mg/dL 8.5   Total Protein 6.5 - 8.1 g/dL 6.0   Total Bilirubin 0.0 - 1.2 mg/dL 0.6   Alkaline Phos 38 - 126 U/L 57   AST 15 - 41 U/L 29   ALT 0 - 44 U/L 22       Latest Ref Rng & Units 09/10/2023    2:57 PM  CBC  WBC 4.0 - 10.5 K/uL 4.0   Hemoglobin 12.0 - 15.0 g/dL 69.6   Hematocrit 29.5 - 46.0 % 35.7   Platelets 150 - 400 K/uL 175      DG Cervical Spine 2 or 3 views Result Date: 08/29/2023 CLINICAL DATA:  Dense fracture EXAM: CERVICAL SPINE - 2-3 VIEW COMPARISON:  Cervical spine CT 08/05/2023 FINDINGS: Again seen is a transverse fractures of the mid dens. Spinal alignment appears stable from prior. No new fractures are seen. There is no prevertebral soft tissue swelling. Degenerative disc space narrowing is seen throughout the cervical spine similar to prior. IMPRESSION: 1. Stable alignment of the transverse fracture of the mid dens. No new fractures are seen. 2. Degenerative changes throughout the cervical spine. Electronically Signed   By: Tyron Gallon M.D.   On: 08/29/2023 21:23     Assessment and plan- Patient is a 88 y.o. female in follow-up of iron deficiency anemia   Patient's hemoglobin has improved with IV iron from 6.33 weeks ago presently to 11.3.  MCV is normal at 94.7.  Ferritin levels are elevated at 778 with an iron saturation of 26%.  Elevated ferritin likely an  acute phase reactant she does not require any IV iron at this time.  CBC ferritin and iron studies in 2 4 and 6 months and I will see her back in 6 months.   Visit Diagnosis 1. Other iron deficiency anemia      Dr. Seretha Dance, MD, MPH [facility] at [facility] [phone] 09/14/2023 8:33 PM

## 2023-09-15 ENCOUNTER — Other Ambulatory Visit: Payer: Self-pay

## 2023-09-15 DIAGNOSIS — S12100S Unspecified displaced fracture of second cervical vertebra, sequela: Secondary | ICD-10-CM

## 2023-09-15 NOTE — Progress Notes (Signed)
 Cardiology Clinic Note   Date: 09/24/2023 ID: [name] [name], DOB 1936-04-02, MRN [id number]  Primary Cardiologist:  Belva Boyden, MD  Chief Complaint   [name] [name] is a 88 y.o. female who presents to the clinic today for follow up after echo.   Patient Profile   [name] [name] is followed by Dr. Gollan for the history outlined below.       Past medical history significant for: Mitral valve regurgitation. Echo 07/28/2023: EF 55 to 60%.  No RWMA.  Grade I DD.  Normal RV size/function.  Mild MR. Hyperlipidemia. Lipid panel 10/15/2022: LDL 67, HDL 49, TG 181, total 152. Palpitations. Iron deficiency anemia. Hiatal hernia. Fibromyalgia. RA.  In summary, patient has a history of stable mild to moderate mitral valve regurgitation.  Last echo October 2023 as detailed above. She has a long history of palpitations well controlled on metoprolol .   Patient was seen in the office by Dr. Gollan on 05/20/2022 for routine follow-up.  She complained of fatigue and dyspnea.  She had recently been diagnosed with iron deficiency anemia and PCP attributed symptoms to that.  She had an echo in October 2023 as above.   Patient was last seen in the office by me on 06/09/2023 for routine follow-up.  She reported increased dyspnea with activities around the home.  She was undergoing a full workup for anemia including bone biopsy.  She was undergoing iron infusions.  She was also pending a visit to pulmonology.  She attributed dyspnea and fatigue to anemia.  She reported occasional palpitations described as heart racing that she felt were well-controlled with metoprolol .     History of Present Illness    Today, patient is accompanied by her husband. She reports on 4/17 she had a mechanical fall at home and fractured her cervical spine. She is in a soft cervical brace today. She is hoping it will heal with bracing and she will not need surgery. She is still dealing with chronic anemia. After her fall she began to  become even more fatigued and short of breath. Her hemoglobin dropped to 6.3 and she underwent transfusion of 2 units of PRBC. She has also continued iron infusions. She reports improved energy and breathing when hemoglobin is higher. They still do not know the cause of anemia. She reports episodes of central chest pain with no particular pattern. She is unsure how long the pain lasts. It is sometimes relieved with Tums or Pepcid . Her activity has been limited secondary to knee pain and cervical fracture. She reports palpitations are well controlled on metoprolol .     ROS: All other systems reviewed and are otherwise negative except as noted in History of Present Illness.  EKGs/Labs Reviewed    EKG Interpretation Date/Time:  Wednesday September 24 2023 10:36:57 EDT Ventricular Rate:  78 PR Interval:  174 QRS Duration:  124 QT Interval:  406 QTC Calculation: 462 R Axis:   -47  Text Interpretation: Normal sinus rhythm Left axis deviation Left ventricular hypertrophy with QRS widening and repolarization abnormality ( R in aVL , Cornell product ) Possible Lateral infarct , age undetermined LBBB When compared with ECG of 09-Jun-2023 09:23, No significant change was found Confirmed by Morey Ar [phone]) on 09/24/2023 10:46:57 AM   09/10/2023: ALT 22; AST 29; BUN 26; Creatinine 0.76; Potassium 4.3; Sodium 137   09/10/2023: Hemoglobin 11.3; WBC Count 4.0   10/15/2022: TSH 3.55   Physical Exam    VS:  BP 122/65  Pulse 78   Ht 4' 8 (1.422 m)   Wt 154 lb 12.8 oz (70.2 kg)   SpO2 93%   BMI 34.71 kg/m  , BMI Body mass index is 34.71 kg/m.  GEN: Well nourished, well developed, in no acute distress. Neck: No JVD or carotid bruits. Cardiac:  RRR. No murmurs. No rubs or gallops.   Respiratory:  Respirations regular and unlabored. Clear to auscultation without rales, wheezing or rhonchi. GI: Soft, nontender, nondistended. Extremities: Radials/DP/PT 2+ and equal bilaterally. No clubbing or  cyanosis. Mild nonpitting edema bilateral lower extremities.   Skin: Warm and dry, no rash. Neuro: Strength intact.  Assessment & Plan   Chest pain/EKG changes EKG today demonstrate NSR 91 bpm, LBBB changed from prior EKG 05/20/2022. EKG today shows continued LBBB. She continues to have dyspnea related to anemia. She reports episodes of central chest pain with no particular pattern. She is unable to describe how the pain feels or how long it lasts. It is sometimes relieved with Tums or Pepcid  but sometimes lasts longer and it worries me.  - Schedule coronary CTA.    Mitral valve regurgitation Echo April 2025 showed normal LV/RV function, Grade I DD, mild  MR.  Patient reports dyspnea that improves when hemoglobin improves. No dizziness or lightheadedness.  - Repeat echo when clinically indicated.  Palpitations Patient reports palpitations described as heart racing are unchanged from previous. EKG today shows NSR.  -Continue metoprolol .   Mixed hyperlipidemia/statin intolerance Lipid panel July 2024 LDL 67, TG 181.  Patient with history of myalgias on statins.  She was unable to tolerate Zetia secondary to diarrhea.  PCP started fenofibrate  in August 2024. -Continue fenofibrate .  Disposition: Coronary CTA. Return in 4 months or sooner as needed.          Signed, Lonell Rives. Moroni Nester, DNP, NP-C

## 2023-09-16 ENCOUNTER — Ambulatory Visit
Admission: RE | Admit: 2023-09-16 | Discharge: 2023-09-16 | Disposition: A | Discharge status: Home / Self Care | Attending: Neurosurgery | Admitting: Neurosurgery

## 2023-09-16 ENCOUNTER — Ambulatory Visit (INDEPENDENT_AMBULATORY_CARE_PROVIDER_SITE_OTHER): Admitting: Neurosurgery

## 2023-09-16 ENCOUNTER — Encounter: Payer: Self-pay | Admitting: Neurosurgery

## 2023-09-16 ENCOUNTER — Ambulatory Visit
Admission: RE | Admit: 2023-09-16 | Discharge: 2023-09-16 | Disposition: A | Discharge status: Home / Self Care | Source: Ambulatory Visit | Attending: Neurosurgery | Admitting: Neurosurgery

## 2023-09-16 VITALS — BP 128/82 | Ht <= 58 in | Wt 155.0 lb

## 2023-09-16 DIAGNOSIS — S12100D Unspecified displaced fracture of second cervical vertebra, subsequent encounter for fracture with routine healing: Secondary | ICD-10-CM

## 2023-09-16 DIAGNOSIS — S12100S Unspecified displaced fracture of second cervical vertebra, sequela: Secondary | ICD-10-CM

## 2023-09-16 DIAGNOSIS — M47812 Spondylosis without myelopathy or radiculopathy, cervical region: Secondary | ICD-10-CM | POA: Diagnosis not present

## 2023-09-16 NOTE — Progress Notes (Signed)
 Referring Physician:  Gabriel John, NP [address] [place],  [place] [address]  Primary Physician:  Gabriel John, NP  History of Present Illness: 09/16/2023 She has been compliant with her brace.  She has no neck pain currently.   08/19/2023 (Stacy Luna's note) Ms. Veleka Djordjevic has a history of MVP, bilateral PE, endocarditis, GERD, Barrett's esophagus, hypothyroidism, osteopenia, dyslipidemia, FM.   Had a fall on 07/31/23 and was seen in ED on 08/05/23 with neck pain. Was found to have dens fracture.   She was placed in cervical collar and was told to remove to sleep and eat.   She is here for follow up.   She has only occasional neck pain with no arm pain. It has improved since injury. No numbness, tingling, or weakness. She is not wearing collar she was given in ED. She is wearing a soft collar.   She is set up to have transfusion and iron infusion tomorrow due to anemia.   She is taking neurontin  and voltaren  gel. 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   She does not smoke.   Bowel/Bladder Dysfunction: none  Past Surgery: Back Surgery x 5 No neck surgery   The symptoms are causing a significant impact on the patient's life.   Review of Systems:  A 10 point review of systems is negative, except for the pertinent positives and negatives detailed in the HPI.  Past Medical History: Past Medical History:  Diagnosis Date   Anxiety    prev on prozac   Chest pain    Felt to be noncardiac in the past   Chronic rectal fissure    Colon polyp    Dyslipidemia    Fibromyalgia    GERD (gastroesophageal reflux disease)    Barrett's esophagus   Hiatal hernia    Lumbar spine pain    Complex lumbar spine surgery at [facility], 2012, complicated, MRSA, much of the appliance is removed, patient on chronic antibiotics   Melanoma (HCC)    Mitral regurgitation    a. 2007 Echo Mild MR; b. 10/2016 Echo: EF 55-60%, no rwma, GrI DD, Mild MR.   Nausea and  vomiting 08/22/2021   Osteoarthritis    Scoliosis    Sinusitis    Multiple sinus operations in the past   Statin intolerance    As of 2009, no further attempts to use statins   Thyroid  nodule     Past Surgical History: Past Surgical History:  Procedure Laterality Date   ABDOMINAL HYSTERECTOMY     BACK SURGERY     CHOLECYSTECTOMY     COLONOSCOPY WITH PROPOFOL  N/A 08/07/2022   Procedure: COLONOSCOPY WITH PROPOFOL ;  Surgeon: Luke Salaam, MD;  Location: [facility] ENDOSCOPY;  Service: Gastroenterology;  Laterality: N/A;   ESOPHAGOGASTRODUODENOSCOPY (EGD) WITH PROPOFOL  N/A 08/07/2022   Procedure: ESOPHAGOGASTRODUODENOSCOPY (EGD) WITH PROPOFOL ;  Surgeon: Luke Salaam, MD;  Location: [facility] ENDOSCOPY;  Service: Gastroenterology;  Laterality: N/A;   FOOT SURGERY     GIVENS CAPSULE STUDY N/A 10/09/2022   Procedure: GIVENS CAPSULE STUDY;  Surgeon: Luke Salaam, MD;  Location: [facility] ENDOSCOPY;  Service: Gastroenterology;  Laterality: N/A;   HERNIA REPAIR     IR BONE MARROW BIOPSY & ASPIRATION  06/02/2023   KNEE SURGERY Right 08/08/2021   total knee replacement   NASAL SINUS SURGERY      Allergies: Allergies as of 09/16/2023 - Review Complete 09/16/2023  Allergen Reaction Noted   Other Other (See Comments) 07/08/2017  Fenofibric acid Other (See Comments) 09/19/2014   Nsaids  12/15/2009   Statins Other (See Comments) 08/13/2011   Trilipix [choline fenofibrate ]  08/13/2011   Wasp venom Swelling 10/27/2017    Medications: Outpatient Encounter Medications as of 09/16/2023  Medication Sig   ALPHA LIPOIC ACID PO Take 1 tablet by mouth daily. Daily   amitriptyline  (ELAVIL ) 10 MG tablet TAKE 1 TABLET (10 MG TOTAL) BY MOUTH AT BEDTIME FOR HEADACHE PREVENTION   calcium carbonate (OS-CAL) 600 MG TABS Take 1,200 mg by mouth 2 (two) times daily with a meal.   cholecalciferol (VITAMIN D) 1000 UNITS tablet Take 1,000 Units by mouth daily.   ciclopirox (LOPROX) 0.77 % cream Apply 1 g/mL topically 2 (two) times  daily.   clotrimazole -betamethasone  (LOTRISONE ) cream APPLY TOPICALLY 2 (TWO) TIMES DAILY AS NEEDED.   diclofenac  sodium (VOLTAREN ) 1 % GEL Apply 2-4 grams to affected area up to 4 times daily   fenofibrate  micronized (LOFIBRA) 134 MG capsule TAKE 1 CAPSULE (134 MG TOTAL) BY MOUTH AT BEDTIME FOR CHOLESTEROL.   fish oil-omega-3 fatty acids 1000 MG capsule Take 2 g by mouth 2 (two) times daily.   gabapentin  (NEURONTIN ) 100 MG capsule TAKE 1 CAPSULE BY MOUTH THREE TIMES A DAY   Ginger, Zingiber officinalis, (GINGER PO) Take 1 tablet by mouth daily.   levothyroxine  (SYNTHROID ) 50 MCG tablet TAKE 1 TABLET BY MOUTH EVERY MORNING WITH WATER ON AN EMPTY STOMACH. NO OTHER FOODS/MEDS FOR 30 MINS   metoprolol  tartrate (LOPRESSOR ) 25 MG tablet TAKE 1 TAB BY MOUTH TWICE A DAY AND EXTRA TABLET AS NEEDED FOR BREAKTHROUGH PALPITATIONS AS DIRECTED   Multiple Vitamins-Minerals (MULTIVITAMIN WITH MINERALS) tablet Take 1 tablet by mouth daily.   NON FORMULARY Take 1 capsule by mouth daily. Tumeric      Daily   sulfamethoxazole -trimethoprim  (BACTRIM  DS) 800-160 MG tablet TAKE 1 TABLET (160 MG OF TRIMETHOPRIM  TOTAL) BY MOUTH 2 (TWO) TIMES DAILY   tretinoin (RETIN-A) 0.05 % cream Apply 1 application topically daily as needed (as directed per dermatologist).    omeprazole  (PRILOSEC ) 20 MG capsule TAKE 2 CAPS (40 MG TOTAL) BY MOUTH IN THE MORNING AND AT BEDTIME.   No facility-administered encounter medications on file as of 09/16/2023.    Social History: Social History   Tobacco Use   Smoking status: Never    Passive exposure: Never   Smokeless tobacco: Never  Vaping Use   Vaping status: Never Used  Substance Use Topics   Alcohol use: No   Drug use: No    Family Medical History: Family History  Problem Relation Age of Onset   GER disease Mother    Dementia Mother    Breast cancer Mother 60   Heart disease Father        MI at 4   Scoliosis Daughter    Colon cancer Neg Hx     Physical  Examination: Vitals:   09/16/23 1115  BP: 128/82    General: Patient is well developed, well nourished, calm, collected, and in no apparent distress. Attention to examination is appropriate.  Respiratory: Patient is breathing without any difficulty.   NEUROLOGICAL:     Awake, alert, oriented to person, place, and time.  Speech is clear and fluent. Fund of knowledge is appropriate.   Cranial Nerves: Pupils equal round and reactive to light.  Facial tone is symmetric.     Strength: MAEW  Slow gait. She walks flexed forward.    Medical Decision Making  Imaging: CT of cervical  spine dated 08/05/23:  FINDINGS: Alignment: Stable near anatomic cervical alignment. No focal angulation or significant listhesis.   Skull base and vertebrae: There is an acute nondisplaced fracture through the base of the odontoid process, best seen on the sagittal and coronal images (type 2 dens fracture) resulting in slight posterior subluxation of the left lateral atlantoaxial joint. No other evidence of acute fracture or traumatic subluxation.   Soft tissues and spinal canal: No prevertebral fluid or swelling. No visible canal hematoma. Synovial thickening around the odontoid process, similar to previous CT. No significant epidural hematoma identified.   Disc levels: Multilevel spondylosis with disc space narrowing, uncinate spurring and facet hypertrophy, similar to previous CT. No large disc herniation or high-grade spinal stenosis demonstrated. Mild to moderate foraminal narrowing at multiple levels.   Upper chest: No acute findings.   Other: Bilateral carotid atherosclerosis.   IMPRESSION: 1. Acute nondisplaced type 2 dens fracture with slight posterior subluxation of the left lateral atlantoaxial joint. 2. No other evidence of acute cervical spine fracture, traumatic subluxation or static signs of instability. 3. Multilevel cervical spondylosis, similar to previous CT.    Electronically Signed: By: Elmon Hagedorn M.D. On: 08/05/2023 10:28  ADDENDUM REPORT: 08/05/2023 10:39   ADDENDUM: Critical Value/emergent results were called by telephone at the time of interpretation on 08/05/2023 at 10:31 am to provider Aneta Keepers, PA, who verbally acknowledged these results.     Electronically Signed   By: Elmon Hagedorn M.D.   On: 08/05/2023 10:39  Xrays today reviewed - no change  I have personally reviewed the images and agree with the above interpretation.  Assessment and Plan: Ms. Przybysz had a fall on 07/31/23 and was found to have dens fracture.   She is doing well currently.  We will continue to follow conservatively.  She will return to clinic in 6 weeks and have flexion-extension x-rays.  [facility] Neurosurgery

## 2023-09-19 ENCOUNTER — Encounter: Payer: Self-pay | Admitting: Oncology

## 2023-09-22 ENCOUNTER — Ambulatory Visit: Admitting: Physician Assistant

## 2023-09-24 ENCOUNTER — Ambulatory Visit: Attending: Student | Admitting: Student

## 2023-09-24 ENCOUNTER — Encounter: Payer: Self-pay | Admitting: Student

## 2023-09-24 VITALS — BP 122/65 | HR 78 | Ht <= 58 in | Wt 154.8 lb

## 2023-09-24 DIAGNOSIS — I447 Left bundle-branch block, unspecified: Secondary | ICD-10-CM | POA: Diagnosis not present

## 2023-09-24 DIAGNOSIS — Z789 Other specified health status: Secondary | ICD-10-CM

## 2023-09-24 DIAGNOSIS — I340 Nonrheumatic mitral (valve) insufficiency: Secondary | ICD-10-CM | POA: Diagnosis not present

## 2023-09-24 DIAGNOSIS — R002 Palpitations: Secondary | ICD-10-CM

## 2023-09-24 DIAGNOSIS — R072 Precordial pain: Secondary | ICD-10-CM

## 2023-09-24 DIAGNOSIS — E782 Mixed hyperlipidemia: Secondary | ICD-10-CM

## 2023-09-24 MED ORDER — METOPROLOL TARTRATE 100 MG PO TABS
ORAL_TABLET | ORAL | 0 refills | Status: AC
Start: 2023-09-24 — End: ?

## 2023-09-24 NOTE — Patient Instructions (Signed)
 Medication Instructions:   Please take 100 mg Metoprolol  2 hours prior to your CTA procedure.  *If you need a refill on your cardiac medications before your next appointment, please call your pharmacy*  Lab Work: None ordered at this time  If you have labs (blood work) drawn today and your tests are completely normal, you will receive your results only by: MyChart Message (if you have MyChart) OR A paper copy in the mail If you have any lab test that is abnormal or we need to change your treatment, we will call you to review the results.  Testing/Procedures: Your cardiac CT will be scheduled at :   [facility] [address] [place], [place] [address] [phone]  When scheduled at  [facility], please arrive 15 mins early for check-in and test prep.  Please follow these instructions carefully (unless otherwise directed):  An IV will be required for this test and Nitroglycerin will be given.  Hold all erectile dysfunction medications at least 3 days (72 hrs) prior to test. (Ie viagra, cialis, sildenafil, tadalafil, etc)   On the Night Before the Test: Be sure to Drink plenty of water. Do not consume any caffeinated/decaffeinated beverages or chocolate 12 hours prior to your test. Do not take any antihistamines 12 hours prior to your test. If the patient has contrast allergy: Patient will need a prescription for Prednisone  and very clear instructions (as follows): Prednisone  50 mg - take 13 hours prior to test Take another Prednisone  50 mg 7 hours prior to test Take another Prednisone  50 mg 1 hour prior to test Take Benadryl  50 mg 1 hour prior to test Patient must complete all four doses of above prophylactic medications. Patient will need a ride after test due to Benadryl .  On the Day of the Test: Drink plenty of water until 1 hour prior to the test. Do not eat any food 1 hour prior to test. You may take your regular  medications prior to the test.  Take metoprolol  (Lopressor ) two hours prior to test. If you take Furosemide/Hydrochlorothiazide/Spironolactone/Chlorthalidone, please HOLD on the morning of the test. Patients who wear a continuous glucose monitor MUST remove the device prior to scanning. FEMALES- please wear underwire-free bra if available, avoid dresses & tight clothing       After the Test: Drink plenty of water. After receiving IV contrast, you may experience a mild flushed feeling. This is normal. On occasion, you may experience a mild rash up to 24 hours after the test. This is not dangerous. If this occurs, you can take Benadryl  25 mg, Zyrtec, Claritin, or Allegra and increase your fluid intake. (Patients taking Tikosyn should avoid Benadryl , and may take Zyrtec, Claritin, or Allegra) If you experience trouble breathing, this can be serious. If it is severe call 911 IMMEDIATELY. If it is mild, please call our office.  We will call to schedule your test 2-4 weeks out understanding that some insurance companies will need an authorization prior to the service being performed.   For more information and frequently asked questions, please visit our website : [email or web address]  For non-scheduling related questions, please contact the cardiac imaging nurse navigator should you have any questions/concerns: Cardiac Imaging Nurse Navigators Direct Office Dial: [phone]   For scheduling needs, including cancellations and rescheduling, please call [place], [phone].    Follow-Up: At [facility], you and your health needs are our priority.  As part of our continuing mission to provide you with  exceptional heart care, our providers are all part of one team.  This team includes your primary Cardiologist (physician) and Advanced Practice Providers or APPs (Physician Assistants and Nurse Practitioners) who all work together to provide you with the care you need, when  you need it.  Your next appointment:   3 month(s)  Provider:   You may see Timothy Gollan, MD or one of the following Advanced Practice Providers on your designated Care Team:   Laneta Pintos, NP Gildardo Labrador, PA-C Varney Gentleman, PA-C Cadence Westminster, PA-C Ronald Cockayne, NP Morey Ar, NP    We recommend signing up for the patient portal called MyChart.  Sign up information is provided on this After Visit Summary.  MyChart is used to connect with patients for Virtual Visits (Telemedicine).  Patients are able to view lab/test results, encounter notes, upcoming appointments, etc.  Non-urgent messages can be sent to your provider as well.   To learn more about what you can do with MyChart, go to [email or web address].

## 2023-10-01 ENCOUNTER — Other Ambulatory Visit: Payer: Self-pay | Admitting: Primary Care

## 2023-10-01 ENCOUNTER — Other Ambulatory Visit: Payer: Self-pay

## 2023-10-01 DIAGNOSIS — E785 Hyperlipidemia, unspecified: Secondary | ICD-10-CM

## 2023-10-01 DIAGNOSIS — E039 Hypothyroidism, unspecified: Secondary | ICD-10-CM

## 2023-10-01 DIAGNOSIS — L814 Other melanin hyperpigmentation: Secondary | ICD-10-CM | POA: Diagnosis not present

## 2023-10-01 DIAGNOSIS — D1801 Hemangioma of skin and subcutaneous tissue: Secondary | ICD-10-CM | POA: Diagnosis not present

## 2023-10-01 DIAGNOSIS — Z85820 Personal history of malignant melanoma of skin: Secondary | ICD-10-CM | POA: Diagnosis not present

## 2023-10-01 DIAGNOSIS — L821 Other seborrheic keratosis: Secondary | ICD-10-CM | POA: Diagnosis not present

## 2023-10-01 DIAGNOSIS — S12100S Unspecified displaced fracture of second cervical vertebra, sequela: Secondary | ICD-10-CM

## 2023-10-06 ENCOUNTER — Other Ambulatory Visit (INDEPENDENT_AMBULATORY_CARE_PROVIDER_SITE_OTHER): Payer: Self-pay

## 2023-10-06 ENCOUNTER — Ambulatory Visit (INDEPENDENT_AMBULATORY_CARE_PROVIDER_SITE_OTHER): Admitting: Physician Assistant

## 2023-10-06 ENCOUNTER — Encounter: Payer: Self-pay | Admitting: Physician Assistant

## 2023-10-06 DIAGNOSIS — S62614D Displaced fracture of proximal phalanx of right ring finger, subsequent encounter for fracture with routine healing: Secondary | ICD-10-CM

## 2023-10-06 DIAGNOSIS — M79641 Pain in right hand: Secondary | ICD-10-CM | POA: Diagnosis not present

## 2023-10-06 DIAGNOSIS — S62646G Nondisplaced fracture of proximal phalanx of right little finger, subsequent encounter for fracture with delayed healing: Secondary | ICD-10-CM

## 2023-10-06 NOTE — Progress Notes (Signed)
 HPI: [name] [name] returns today for follow-up of her right hand.  Status post fall on April 17 in which she sustained right hand fractures involving the proximal 4th and 5th phalanx.  She is overall trending towards improvement.  She does have significant arthritis in her hands and has pain in the hand with all times.  She is no longer using her brace.  Physical exam: Right hand she has significant qualities arthritic changes throughout the hand.  Minimal tenderness at the base of the 4th and 5th proximal phalanx.   Radiographs: 3 views right hand shows the base of the fourth fifth proximal phalanx fractures to be healed.  There is significant callus formation.  No acute findings otherwise.  Impression: Right hand 4th and 5th proximal phalanx fractures  Plan: She is activities as tolerated.  Questions were encouraged and answered at length. Follow up PRN.

## 2023-10-08 ENCOUNTER — Ambulatory Visit: Payer: Medicare Other

## 2023-10-08 VITALS — Ht <= 58 in | Wt 148.0 lb

## 2023-10-08 DIAGNOSIS — Z0000 Encounter for general adult medical examination without abnormal findings: Secondary | ICD-10-CM

## 2023-10-08 NOTE — Patient Instructions (Addendum)
 [name] , Thank you for taking time out of your busy schedule to complete your Annual Wellness Visit with me. I enjoyed our conversation and look forward to speaking with you again next year. I, as well as your care team,  appreciate your ongoing commitment to your health goals. Please review the following plan we discussed and let me know if I can assist you in the future. Your Game plan/ To Do List    Follow up Visits: Next Medicare AWV with our clinical staff: 10/08/24 @ 9:30am televisit   Have you seen your provider in the last 6 months (3 months if uncontrolled diabetes)? Yes Next Office Visit with your provider: 10/28/23  Clinician Recommendations:  Aim for 30 minutes of exercise or brisk walking, 6-8 glasses of water, and 5 servings of fruits and vegetables each day.       This is a list of the screening recommended for you and due dates:  Health Maintenance  Topic Date Due   Zoster (Shingles) Vaccine (1 of 2) Never done   COVID-19 Vaccine (6 - 2024-25 season) 12/15/2022   Flu Shot  11/14/2023   Medicare Annual Wellness Visit  10/07/2024   DTaP/Tdap/Td vaccine (3 - Td or Tdap) 10/23/2027   Pneumococcal Vaccine for age over 56  Completed   DEXA scan (bone density measurement)  Completed   Hepatitis B Vaccine  Aged Out   HPV Vaccine  Aged Out   Meningitis B Vaccine  Aged Out    Advanced directives: (Declined) Advance directive discussed with you today. Even though you declined this today, please call our office should you change your mind, and we can give you the proper paperwork for you to fill out. Advance Care Planning is important because it:  [x]  Makes sure you receive the medical care that is consistent with your values, goals, and preferences  [x]  It provides guidance to your family and loved ones and reduces their decisional burden about whether or not they are making the right decisions based on your wishes.  Follow the link provided in your after visit summary or read  over the paperwork we have mailed to you to help you started getting your Advance Directives in place. If you need assistance in completing these, please reach out to us  so that we can help you!

## 2023-10-08 NOTE — Progress Notes (Signed)
 Subjective:   [name] [name] is a 88 y.o. who presents for a Medicare Wellness preventive visit.  As a reminder, Annual Wellness Visits don't include a physical exam, and some assessments may be limited, especially if this visit is performed virtually. We may recommend an in-person follow-up visit with your provider if needed.  Visit Complete: Virtual I connected with  [name] [name] on 10/08/23 by a audio enabled telemedicine application and verified that I am speaking with the correct person using two identifiers.  Patient Location: Home  Provider Location: Home Office  I discussed the limitations of evaluation and management by telemedicine. The patient expressed understanding and agreed to proceed.  Vital Signs: Because this visit was a virtual/telehealth visit, some criteria may be missing or patient reported. Any vitals not documented were not able to be obtained and vitals that have been documented are patient reported.  VideoDeclined- This patient declined Librarian, academic. Therefore the visit was completed with audio only.  Persons Participating in Visit: Patient.  AWV Questionnaire: No: Patient Medicare AWV questionnaire was not completed prior to this visit.  Cardiac Risk Factors include: advanced age (>15men, >57 women);dyslipidemia;obesity (BMI >30kg/m2)     Objective:    Today's Vitals   10/08/23 0854 10/08/23 0856  Weight: 148 lb (67.1 kg)   Height: 4' 8 (1.422 m)   PainSc:  7    Body mass index is 33.18 kg/m.     10/08/2023    9:11 AM 09/10/2023    3:21 PM 08/18/2023    2:57 PM 08/05/2023    9:56 AM 06/09/2023    2:26 PM 06/09/2023    2:23 PM 04/21/2023   12:48 PM  Advanced Directives  Does Patient Have a Medical Advance Directive? No No No No No No No  Would patient like information on creating a medical advance directive?  No - Patient declined         Current Medications (verified) Outpatient Encounter Medications as of  10/08/2023  Medication Sig   ALPHA LIPOIC ACID PO Take 1 tablet by mouth daily. Daily   amitriptyline  (ELAVIL ) 10 MG tablet TAKE 1 TABLET (10 MG TOTAL) BY MOUTH AT BEDTIME FOR HEADACHE PREVENTION   calcium carbonate (OS-CAL) 600 MG TABS Take 1,200 mg by mouth 2 (two) times daily with a meal.   cholecalciferol (VITAMIN D) 1000 UNITS tablet Take 1,000 Units by mouth daily.   ciclopirox (LOPROX) 0.77 % cream Apply 1 g/mL topically 2 (two) times daily.   clotrimazole -betamethasone  (LOTRISONE ) cream APPLY TOPICALLY 2 (TWO) TIMES DAILY AS NEEDED.   diclofenac  sodium (VOLTAREN ) 1 % GEL Apply 2-4 grams to affected area up to 4 times daily   fenofibrate  micronized (LOFIBRA) 134 MG capsule TAKE 1 CAPSULE (134 MG TOTAL) BY MOUTH AT BEDTIME FOR CHOLESTEROL.   fish oil-omega-3 fatty acids 1000 MG capsule Take 2 g by mouth 2 (two) times daily.   gabapentin  (NEURONTIN ) 100 MG capsule TAKE 1 CAPSULE BY MOUTH THREE TIMES A DAY   Ginger, Zingiber officinalis, (GINGER PO) Take 1 tablet by mouth daily.   levothyroxine  (SYNTHROID ) 50 MCG tablet TAKE 1 TABLET BY MOUTH EVERY MORNING WITH WATER ON AN EMPTY STOMACH. NO OTHER FOODS/MEDS FOR 30 MINS   metoprolol  tartrate (LOPRESSOR ) 100 MG tablet TAKE 1 TABLET 2 HR PRIOR TO CARDIAC PROCEDURE   metoprolol  tartrate (LOPRESSOR ) 25 MG tablet TAKE 1 TAB BY MOUTH TWICE A DAY AND EXTRA TABLET AS NEEDED FOR BREAKTHROUGH PALPITATIONS AS DIRECTED   Multiple  Vitamins-Minerals (MULTIVITAMIN WITH MINERALS) tablet Take 1 tablet by mouth daily.   NON FORMULARY Take 1 capsule by mouth daily. Tumeric      Daily   sulfamethoxazole -trimethoprim  (BACTRIM  DS) 800-160 MG tablet TAKE 1 TABLET (160 MG OF TRIMETHOPRIM  TOTAL) BY MOUTH 2 (TWO) TIMES DAILY   tretinoin (RETIN-A) 0.05 % cream Apply 1 application topically daily as needed (as directed per dermatologist).    omeprazole  (PRILOSEC ) 20 MG capsule TAKE 2 CAPS (40 MG TOTAL) BY MOUTH IN THE MORNING AND AT BEDTIME. (Patient not taking: Reported  on 10/08/2023)   No facility-administered encounter medications on file as of 10/08/2023.    Allergies (verified) Other, Fenofibric acid, Nsaids, Statins, Trilipix [choline fenofibrate ], and Wasp venom   History: Past Medical History:  Diagnosis Date   Anxiety    prev on prozac   Chest pain    Felt to be noncardiac in the past   Chronic rectal fissure    Colon polyp    Dyslipidemia    Fibromyalgia    GERD (gastroesophageal reflux disease)    Barrett's esophagus   Hiatal hernia    Lumbar spine pain    Complex lumbar spine surgery at [facility], 2012, complicated, MRSA, much of the appliance is removed, patient on chronic antibiotics   Melanoma (HCC)    Mitral regurgitation    a. 2007 Echo Mild MR; b. 10/2016 Echo: EF 55-60%, no rwma, GrI DD, Mild MR.   Nausea and vomiting 08/22/2021   Osteoarthritis    Scoliosis    Sinusitis    Multiple sinus operations in the past   Statin intolerance    As of 2009, no further attempts to use statins   Thyroid  nodule    Past Surgical History:  Procedure Laterality Date   ABDOMINAL HYSTERECTOMY     BACK SURGERY     CHOLECYSTECTOMY     COLONOSCOPY WITH PROPOFOL  N/A 08/07/2022   Procedure: COLONOSCOPY WITH PROPOFOL ;  Surgeon: Therisa Bi, MD;  Location: [facility] ENDOSCOPY;  Service: Gastroenterology;  Laterality: N/A;   ESOPHAGOGASTRODUODENOSCOPY (EGD) WITH PROPOFOL  N/A 08/07/2022   Procedure: ESOPHAGOGASTRODUODENOSCOPY (EGD) WITH PROPOFOL ;  Surgeon: Therisa Bi, MD;  Location: [facility] ENDOSCOPY;  Service: Gastroenterology;  Laterality: N/A;   FOOT SURGERY     GIVENS CAPSULE STUDY N/A 10/09/2022   Procedure: GIVENS CAPSULE STUDY;  Surgeon: Therisa Bi, MD;  Location: [facility] ENDOSCOPY;  Service: Gastroenterology;  Laterality: N/A;   HERNIA REPAIR     IR BONE MARROW BIOPSY & ASPIRATION  06/02/2023   KNEE SURGERY Right 08/08/2021   total knee replacement   NASAL SINUS SURGERY     Family History  Problem Relation Age of Onset   GER disease Mother     Dementia Mother    Breast cancer Mother 6   Heart disease Father        MI at 44   Scoliosis Daughter    Colon cancer Neg Hx    Social History   Socioeconomic History   Marital status: Married    Spouse name: Not on file   Number of children: Not on file   Years of education: Not on file   Highest education level: Not on file  Occupational History   Not on file  Tobacco Use   Smoking status: Never    Passive exposure: Never   Smokeless tobacco: Never  Vaping Use   Vaping status: Never Used  Substance and Sexual Activity   Alcohol use: No   Drug use: No   Sexual activity:  Not on file  Other Topics Concern   Not on file  Social History Narrative   From [place]   Married (second [phone]   3 kids, 2 of whom are local   Retired, Tree surgeon   Social Drivers of Health   Financial Resource Strain: Low Risk  (10/08/2023)   Overall Financial Resource Strain (CARDIA)    Difficulty of Paying Living Expenses: Not hard at all  Food Insecurity: No Food Insecurity (10/08/2023)   Hunger Vital Sign    Worried About Running Out of Food in the Last Year: Never true    Ran Out of Food in the Last Year: Never true  Transportation Needs: No Transportation Needs (10/08/2023)   PRAPARE - Administrator, Civil Service (Medical): No    Lack of Transportation (Non-Medical): No  Physical Activity: Insufficiently Active (10/08/2023)   Exercise Vital Sign    Days of Exercise per Week: 3 days    Minutes of Exercise per Session: 30 min  Stress: No Stress Concern Present (10/08/2023)   [employer] of Occupational Health - Occupational Stress Questionnaire    Feeling of Stress: Not at all  Social Connections: Moderately Isolated (10/08/2023)   Social Connection and Isolation Panel    Frequency of Communication with Friends and Family: More than three times a week    Frequency of Social Gatherings with Friends and Family: More than three times a week    Attends  Religious Services: Never    Database administrator or Organizations: No    Attends Engineer, structural: Never    Marital Status: Married    Tobacco Counseling Counseling given: Not Answered    Clinical Intake:  Pre-visit preparation completed: Yes  Pain : 0-10 Pain Score: 7  Pain Type: Chronic pain Pain Location: Back Pain Orientation: Lower Pain Descriptors / Indicators: Aching Pain Onset: More than a month ago Pain Frequency: Constant Pain Relieving Factors: pain patches, gabapentin , tylenol  Effect of Pain on Daily Activities: rest alot and take breaks  Pain Relieving Factors: pain patches, gabapentin , tylenol   BMI - recorded: 33.18 Nutritional Status: BMI > 30  Obese Nutritional Risks: None Diabetes: No  Lab Results  Component Value Date   HGBA1C 4.0 Repeated and verified X2. (L) 10/15/2022   HGBA1C 4.3 (L) 02/05/2019     How often do you need to have someone help you when you read instructions, pamphlets, or other written materials from your doctor or pharmacy?: 1 - Never  Interpreter Needed?: No  Comments: lives with husband Information entered by :: [name] Krauss,LPN   Activities of Daily Living     10/08/2023    9:11 AM 06/02/2023    9:04 AM  In your present state of health, do you have any difficulty performing the following activities:  Hearing? 0 0  Vision? 0 0  Difficulty concentrating or making decisions? 0 0  Walking or climbing stairs? 0   Dressing or bathing? 0   Doing errands, shopping? 0   Preparing Food and eating ? N   Using the Toilet? N   In the past six months, have you accidently leaked urine? N   Do you have problems with loss of bowel control? N   Managing your Medications? N   Managing your Finances? N   Housekeeping or managing your Housekeeping? N     Patient Care Team: Gretta Comer POUR, NP as PCP - General (Internal Medicine) Gollan, Timothy J, MD as PCP - Cardiology (Cardiology)  Melanee Annah BROCKS, MD as  Consulting Physician (Oncology) Enola Feliciano Hugger, MD as Consulting Physician (Ophthalmology)  I have updated your Care Teams any recent Medical Services you may have received from other providers in the past year.     Assessment:   This is a routine wellness examination for [name] [name].  Hearing/Vision screen Hearing Screening - Comments:: Pt says she hears well Vision Screening - Comments:: Pt says her vision is good;readers only  Dr Enola   Goals Addressed               This Visit's Progress     Health management   On track     10/08/23-I will continue to exercise for 60 min 3 days per week, to drink 5-6 glasses of water daily, and to take medications as prescribed.       Patient stated (pt-stated)   On track     10/08/23-I would like to lose a little weight.       Depression Screen     10/08/2023    9:07 AM 10/07/2022    9:22 AM 10/17/2021    9:28 AM 10/02/2021   11:23 AM 09/19/2020   10:35 AM 03/01/2020    9:59 AM 02/05/2019    9:05 AM  PHQ 2/9 Scores  PHQ - 2 Score 0 0 0 0 0 0 0  PHQ- 9 Score   0  0 0 0    Fall Risk     10/08/2023    9:00 AM 10/07/2022    9:24 AM 10/17/2021    9:29 AM 10/02/2021   11:26 AM 09/26/2020    9:32 AM  Fall Risk   Falls in the past year? 1 0 0 0 1  Number falls in past yr: 0 0 0 0 1  Injury with Fall? 1 0  0 1  Risk for fall due to : No Fall Risks No Fall Risks  No Fall Risks   Follow up Education provided;Falls prevention discussed Falls prevention discussed;Falls evaluation completed       MEDICARE RISK AT HOME:  Medicare Risk at Home Any stairs in or around the home?: Yes If so, are there any without handrails?: Yes Home free of loose throw rugs in walkways, pet beds, electrical cords, etc?: Yes Adequate lighting in your home to reduce risk of falls?: Yes Life alert?: No Use of a cane, walker or w/c?: Yes (walker for distance) Grab bars in the bathroom?: Yes Shower chair or bench in shower?: Yes (does not use all the  time) Elevated toilet seat or a handicapped toilet?: Yes  TIMED UP AND GO:  Was the test performed?  No  Cognitive Function: 6CIT completed    09/19/2020   10:39 AM 02/05/2019    9:07 AM 01/27/2017   10:14 AM  MMSE - Mini Mental State Exam  Not completed: Refused    Orientation to time  5 5   Orientation to Place  5 5   Registration  3 3   Attention/ Calculation  5 0   Recall  3 3   Language- name 2 objects   0   Language- repeat  1 1  Language- follow 3 step command   3   Language- read & follow direction   0   Write a sentence   0   Copy design   0   Total score   20      Data saved with a previous flowsheet row definition  10/08/2023    9:17 AM 10/07/2022    9:28 AM 10/02/2021   11:28 AM  6CIT Screen  What Year? 0 points 0 points 0 points  What month? 0 points 0 points 0 points  What time? 0 points 0 points 0 points  Count back from 20 0 points 0 points 0 points  Months in reverse 0 points 0 points 0 points  Repeat phrase 0 points 0 points 0 points  Total Score 0 points 0 points 0 points    Immunizations Immunization History  Administered Date(s) Administered   Fluad Quad(high Dose 65+) 03/01/2020, 01/08/2022   Influenza Split 02/03/2012   Influenza,inj,Quad PF,6+ Mos 02/20/2017, 02/03/2018, 02/10/2019   Influenza-Unspecified 02/02/2016, 01/13/2017, 02/02/2018, 01/08/2022   Moderna Covid-19 Vaccine Bivalent Booster 26yrs & up 01/23/2021   Moderna Sars-Covid-2 Vaccination 04/26/2019, 05/25/2019, 02/11/2020, 08/01/2020   Pneumococcal Conjugate-13 02/10/2019   Pneumococcal Polysaccharide-23 02/03/2018   Td 02/03/2016   Tdap 10/22/2017    Screening Tests Health Maintenance  Topic Date Due   Zoster Vaccines- Shingrix (1 of 2) Never done   COVID-19 Vaccine (6 - 2024-25 season) 12/15/2022   INFLUENZA VACCINE  11/14/2023   Medicare Annual Wellness (AWV)  10/07/2024   DTaP/Tdap/Td (3 - Td or Tdap) 10/23/2027   Pneumococcal Vaccine: 50+ Years  Completed    DEXA SCAN  Completed   Hepatitis B Vaccines  Aged Out   HPV VACCINES  Aged Out   Meningococcal B Vaccine  Aged Out    Health Maintenance  Health Maintenance Due  Topic Date Due   Zoster Vaccines- Shingrix (1 of 2) Never done   COVID-19 Vaccine (6 - 2024-25 season) 12/15/2022   Health Maintenance Items Addressed: None at this time  Additional Screening:  Vision Screening: Recommended annual ophthalmology exams for early detection of glaucoma and other disorders of the eye. Would you like a referral to an eye doctor? No    Dental Screening: Recommended annual dental exams for proper oral hygiene  Community Resource Referral / Chronic Care Management: CRR required this visit?  No   CCM required this visit?  No   Plan:    I have personally reviewed and noted the following in the patient's chart:   Medical and social history Use of alcohol, tobacco or illicit drugs  Current medications and supplements including opioid prescriptions. Patient is not currently taking opioid prescriptions. Functional ability and status Nutritional status Physical activity Advanced directives List of other physicians Hospitalizations, surgeries, and ER visits in previous 12 months Vitals Screenings to include cognitive, depression, and falls Referrals and appointments  In addition, I have reviewed and discussed with patient certain preventive protocols, quality metrics, and best practice recommendations. A written personalized care plan for preventive services as well as general preventive health recommendations were provided to patient.   Erminio LITTIE Saris, LPN   [id number]/74/7974   After Visit Summary: (MyChart) Due to this being a telephonic visit, the after visit summary with patients personalized plan was offered to patient via MyChart   Notes: Please refer to Routing Comments.

## 2023-10-14 ENCOUNTER — Encounter (HOSPITAL_COMMUNITY): Payer: Self-pay

## 2023-10-15 ENCOUNTER — Telehealth (HOSPITAL_COMMUNITY): Payer: Self-pay | Admitting: Emergency Medicine

## 2023-10-15 NOTE — Telephone Encounter (Signed)
 Reaching out to patient to offer assistance regarding upcoming cardiac imaging study; pt verbalizes understanding of appt date/time, parking situation and where to check in, pre-test NPO status and medications ordered, and verified current allergies; name and call back number provided for further questions should they arise Rockwell Alexandria RN Navigator Cardiac Imaging [facility] [phone] office [phone] cell

## 2023-10-16 ENCOUNTER — Ambulatory Visit
Admission: RE | Admit: 2023-10-16 | Discharge: 2023-10-16 | Disposition: A | Discharge status: Home / Self Care | Source: Ambulatory Visit | Attending: Student | Admitting: Student

## 2023-10-16 DIAGNOSIS — R072 Precordial pain: Secondary | ICD-10-CM | POA: Diagnosis not present

## 2023-10-16 MED ORDER — METOPROLOL TARTRATE 5 MG/5ML IV SOLN
INTRAVENOUS | Status: AC
  Filled 2023-10-16: qty 5

## 2023-10-16 MED ORDER — METOPROLOL TARTRATE 5 MG/5ML IV SOLN
5.0000 mg | Freq: Once | INTRAVENOUS | Status: AC | PRN
  Administered 2023-10-16: 5 mg via INTRAVENOUS

## 2023-10-16 MED ORDER — IOHEXOL 350 MG/ML SOLN
80.0000 mL | Freq: Once | INTRAVENOUS | Status: AC | PRN
  Administered 2023-10-16: 80 mL via INTRAVENOUS

## 2023-10-16 MED ORDER — NITROGLYCERIN 0.4 MG SL SUBL
0.8000 mg | SUBLINGUAL_TABLET | Freq: Once | SUBLINGUAL | Status: AC
  Administered 2023-10-16: 0.8 mg via SUBLINGUAL
  Filled 2023-10-16: qty 25

## 2023-10-16 NOTE — Progress Notes (Signed)
 Patient tolerated CT well. Vitals stable. Patient educated to drink fluids throughout day. Patient verbalizes understanding.

## 2023-10-17 ENCOUNTER — Ambulatory Visit: Payer: Self-pay | Admitting: Student

## 2023-10-21 ENCOUNTER — Other Ambulatory Visit: Payer: Self-pay | Admitting: Orthopaedic Surgery

## 2023-10-21 ENCOUNTER — Other Ambulatory Visit (INDEPENDENT_AMBULATORY_CARE_PROVIDER_SITE_OTHER)

## 2023-10-21 ENCOUNTER — Ambulatory Visit: Payer: Self-pay | Admitting: Primary Care

## 2023-10-21 DIAGNOSIS — E785 Hyperlipidemia, unspecified: Secondary | ICD-10-CM

## 2023-10-21 DIAGNOSIS — E039 Hypothyroidism, unspecified: Secondary | ICD-10-CM | POA: Diagnosis not present

## 2023-10-21 DIAGNOSIS — K601 Chronic anal fissure: Secondary | ICD-10-CM

## 2023-10-21 LAB — LIPID PANEL
Cholesterol: 161 mg/dL (ref 0–200)
HDL: 52.2 mg/dL (ref >39.00)
LDL Cholesterol: 70 mg/dL (ref 0–99)
NonHDL: 108.93
Total CHOL/HDL Ratio: 3
Triglycerides: 195 mg/dL — ABNORMAL HIGH (ref 0.0–149.0)
VLDL: 39 mg/dL (ref 0.0–40.0)

## 2023-10-21 LAB — TSH: TSH: 6.24 u[IU]/mL — ABNORMAL HIGH (ref 0.35–5.50)

## 2023-10-24 NOTE — Progress Notes (Addendum)
 Referring Physician:  Gretta Comer POUR, NP [address] [place],  [place] [address]  Primary Physician:  Gretta Comer POUR, NP  History of Present Illness: 10/30/2023 [name] has a history of MVP, bilateral PE, endocarditis, GERD, Barrett's esophagus, hypothyroidism, osteopenia, dyslipidemia, FM.   Last seen by Dr. Clois on 09/16/23 for dens fracture s/p fall on 08/05/23.   Had a fall on 07/31/23 and was seen in ED on 08/05/23 with neck pain. Was found to have dens fracture.   She is here for repeat xrays and follow up.   She has been wearing her collar. Did not put it back on after her xrays earlier. She has minimal neck pain that is intermittent. No arm pain. No numbness, tingling, or weakness.   She does not smoke.   Bowel/Bladder Dysfunction: none  Past Surgery: Back Surgery x 5 No neck surgery   The symptoms are causing a significant impact on the patient's life.   Review of Systems:  A 10 point review of systems is negative, except for the pertinent positives and negatives detailed in the HPI.  Past Medical History: Past Medical History:  Diagnosis Date   Anxiety    prev on prozac   Bilateral pulmonary embolism (HCC) 08/22/2021   Chest pain    Felt to be noncardiac in the past   Chronic rectal fissure    Colon polyp    Dyslipidemia    Fibromyalgia    GERD (gastroesophageal reflux disease)    Barrett's esophagus   Hiatal hernia    Lumbar spine pain    Complex lumbar spine surgery at [facility], 2012, complicated, MRSA, much of the appliance is removed, patient on chronic antibiotics   Melanoma (HCC)    Mitral regurgitation    a. 2007 Echo Mild MR; b. 10/2016 Echo: EF 55-60%, no rwma, GrI DD, Mild MR.   Nausea and vomiting 08/22/2021   Osteoarthritis    Scoliosis    Sinusitis    Multiple sinus operations in the past   Statin intolerance    As of 2009, no further attempts to use statins   Thyroid  nodule     Past Surgical History: Past Surgical  History:  Procedure Laterality Date   ABDOMINAL HYSTERECTOMY     BACK SURGERY     CHOLECYSTECTOMY     COLONOSCOPY WITH PROPOFOL  N/A 08/07/2022   Procedure: COLONOSCOPY WITH PROPOFOL ;  Surgeon: Therisa Bi, MD;  Location: [facility] ENDOSCOPY;  Service: Gastroenterology;  Laterality: N/A;   ESOPHAGOGASTRODUODENOSCOPY (EGD) WITH PROPOFOL  N/A 08/07/2022   Procedure: ESOPHAGOGASTRODUODENOSCOPY (EGD) WITH PROPOFOL ;  Surgeon: Therisa Bi, MD;  Location: [facility] ENDOSCOPY;  Service: Gastroenterology;  Laterality: N/A;   FOOT SURGERY     GIVENS CAPSULE STUDY N/A 10/09/2022   Procedure: GIVENS CAPSULE STUDY;  Surgeon: Therisa Bi, MD;  Location: [facility] ENDOSCOPY;  Service: Gastroenterology;  Laterality: N/A;   HERNIA REPAIR     IR BONE MARROW BIOPSY & ASPIRATION  06/02/2023   KNEE SURGERY Right 08/08/2021   total knee replacement   NASAL SINUS SURGERY      Allergies: Allergies as of 10/30/2023 - Review Complete 10/30/2023  Allergen Reaction Noted   Other Other (See Comments) 07/08/2017   Fenofibric acid Other (See Comments) 09/19/2014   Nsaids  12/15/2009   Statins Other (See Comments) 08/13/2011   Trilipix [choline fenofibrate ]  08/13/2011   Wasp venom Swelling 10/27/2017    Medications: Outpatient Encounter Medications as of 10/30/2023  Medication Sig   ALPHA LIPOIC ACID  PO Take 1 tablet by mouth daily. Daily   amitriptyline  (ELAVIL ) 10 MG tablet TAKE 1 TABLET (10 MG TOTAL) BY MOUTH AT BEDTIME FOR HEADACHE PREVENTION   calcium carbonate (OS-CAL) 600 MG TABS Take 1,200 mg by mouth 2 (two) times daily with a meal.   cholecalciferol (VITAMIN D) 1000 UNITS tablet Take 1,000 Units by mouth daily.   ciclopirox (LOPROX) 0.77 % cream Apply 1 g/mL topically 2 (two) times daily.   clotrimazole -betamethasone  (LOTRISONE ) cream APPLY TOPICALLY 2 (TWO) TIMES DAILY AS NEEDED.   diclofenac  sodium (VOLTAREN ) 1 % GEL Apply 2-4 grams to affected area up to 4 times daily   fenofibrate  micronized (LOFIBRA) 134 MG  capsule TAKE 1 CAPSULE (134 MG TOTAL) BY MOUTH AT BEDTIME FOR CHOLESTEROL.   fish oil-omega-3 fatty acids 1000 MG capsule Take 2 g by mouth 2 (two) times daily.   gabapentin  (NEURONTIN ) 100 MG capsule TAKE 1 CAPSULE BY MOUTH THREE TIMES A DAY   Ginger, Zingiber officinalis, (GINGER PO) Take 1 tablet by mouth daily.   levothyroxine  (SYNTHROID ) 50 MCG tablet TAKE 1 TABLET BY MOUTH EVERY MORNING WITH WATER ON AN EMPTY STOMACH. NO OTHER FOODS/MEDS FOR 30 MINS   metoprolol  tartrate (LOPRESSOR ) 100 MG tablet TAKE 1 TABLET 2 HR PRIOR TO CARDIAC PROCEDURE   metoprolol  tartrate (LOPRESSOR ) 25 MG tablet TAKE 1 TAB BY MOUTH TWICE A DAY AND EXTRA TABLET AS NEEDED FOR BREAKTHROUGH PALPITATIONS AS DIRECTED   Multiple Vitamins-Minerals (MULTIVITAMIN WITH MINERALS) tablet Take 1 tablet by mouth daily.   NON FORMULARY Take 1 capsule by mouth daily. Tumeric      Daily   sulfamethoxazole -trimethoprim  (BACTRIM  DS) 800-160 MG tablet TAKE 1 TABLET (160 MG OF TRIMETHOPRIM  TOTAL) BY MOUTH 2 (TWO) TIMES DAILY   tretinoin (RETIN-A) 0.05 % cream Apply 1 application topically daily as needed (as directed per dermatologist).    [DISCONTINUED] omeprazole  (PRILOSEC ) 20 MG capsule TAKE 2 CAPS (40 MG TOTAL) BY MOUTH IN THE MORNING AND AT BEDTIME.   [DISCONTINUED] sulfamethoxazole -trimethoprim  (BACTRIM  DS) 800-160 MG tablet TAKE 1 TABLET (160 MG OF TRIMETHOPRIM  TOTAL) BY MOUTH 2 (TWO) TIMES DAILY   No facility-administered encounter medications on file as of 10/30/2023.    Social History: Social History   Tobacco Use   Smoking status: Never    Passive exposure: Never   Smokeless tobacco: Never  Vaping Use   Vaping status: Never Used  Substance Use Topics   Alcohol use: No   Drug use: No    Family Medical History: Family History  Problem Relation Age of Onset   GER disease Mother    Dementia Mother    Breast cancer Mother 77   Heart disease Father        MI at 18   Scoliosis Daughter    Colon cancer Neg Hx      Physical Examination: Vitals:   10/30/23 1021  BP: 136/70      Awake, alert, oriented to person, place, and time.  Speech is clear and fluent. Fund of knowledge is appropriate.   Cranial Nerves: Pupils equal round and reactive to light.  Facial tone is symmetric.    Mild posterior cervical tenderness. Mild tenderness in bilateral trapezial region.   No abnormal lesions on exposed skin.   Strength: Side Biceps Triceps Deltoid Interossei Grip Wrist Ext. Wrist Flex.  R 5 5 5 5 5 5 5   L 5 5 5 5 5 5 5    Side Iliopsoas Quads Hamstring PF DF EHL  R 5  5 5 5 5 5   L 5 5 5 5 5 5    Reflexes are 2+ and symmetric at the biceps, brachioradialis, patella and achilles.   Hoffman's is absent bilaterally.   Clonus is not present.    Bilateral upper and lower extremity sensation is intact to light touch.     Slow gait.   Medical Decision Making  Imaging: Cervical xrays dated 10/30/23:  no change noted, she has some movement on flex/ext views.   Report not yet available for above xrays.   Assessment and Plan: Ms. Boule had a fall on 07/31/23 and was found to have dens fracture.   She is doing well and has minimal intermittent neck pain. No arm pain. No numbness, tingling, or weakness.   Xrays from today show some movement on flex/ext views.   Treatment options discussed with patient and following plan made:   - She will stay in collar for now.  - Will review xrays with Dr. Clois and message her with his further recommendations.   I spent a total of 20 minutes in face-to-face and non-face-to-face activities related to this patient's care today including review of outside records, review of imaging, review of symptoms, physical exam, discussion of differential diagnosis, discussion of treatment options, and documentation.   ADDENDUM 10/30/23:  Xrays reviewed with Dr. Clois. He wants to wait for read from radiologist. If read as stable, then can d/c her collar. I called and  let patient know she should hear from me next week. If I don't see report by next Wednesday, we will call to get them read.   ADDENDUM 11/07/23:  Cervical xrays dated 10/30/23:  FINDINGS: Limited evaluation due to overlapping osseous structures and overlying soft tissues. Chronic similar-appearing fracture of the base of the odontoid. Posterior displacement of fracture fragment with limited evaluation due to overlying osseous structures. Multilevel severe degenerative changes of the spine.   There is no evidence of cervical spine fracture or prevertebral soft tissue swelling.   Alignment is normal. Unable to evaluate alignment of odontoid on flexion extension views due to overlying osseous structures. No change in alignment of the lower cervical spine chronic surgeon flexion views with C7 not well visualized due to overlying soft tissues.   No other significant bone abnormalities are identified.   IMPRESSION: Chronic similar-appearing fracture of the base of the odontoid partially visualized. Limited evaluation due to overlapping osseous structures and overlying soft tissues.     Electronically Signed   By: Morgane  Naveau M.D.   On: 11/05/2023 22:30   Above xrays reviewed with Dr. Clois. She can d/c collar.   Called and advised patient of above. Discussed fracture not fully healed but would likely go on to form fibrous union. Reviewed increased risk of spinal cord injury with trauma such as fall or MVA.   She will f/u prn.   Glade Boys PA-C Dept. of Neurosurgery

## 2023-10-26 ENCOUNTER — Other Ambulatory Visit: Payer: Self-pay | Admitting: Primary Care

## 2023-10-26 DIAGNOSIS — Z8614 Personal history of Methicillin resistant Staphylococcus aureus infection: Secondary | ICD-10-CM

## 2023-10-27 NOTE — Progress Notes (Signed)
 Letter Sent.

## 2023-10-28 ENCOUNTER — Encounter: Payer: Self-pay | Admitting: Primary Care

## 2023-10-28 ENCOUNTER — Ambulatory Visit (INDEPENDENT_AMBULATORY_CARE_PROVIDER_SITE_OTHER): Admitting: Primary Care

## 2023-10-28 VITALS — BP 122/58 | HR 87 | Temp 97.2°F | Ht <= 58 in | Wt 156.0 lb

## 2023-10-28 DIAGNOSIS — E039 Hypothyroidism, unspecified: Secondary | ICD-10-CM

## 2023-10-28 DIAGNOSIS — M797 Fibromyalgia: Secondary | ICD-10-CM

## 2023-10-28 DIAGNOSIS — R0609 Other forms of dyspnea: Secondary | ICD-10-CM

## 2023-10-28 DIAGNOSIS — Z792 Long term (current) use of antibiotics: Secondary | ICD-10-CM

## 2023-10-28 DIAGNOSIS — R519 Headache, unspecified: Secondary | ICD-10-CM

## 2023-10-28 DIAGNOSIS — Z0000 Encounter for general adult medical examination without abnormal findings: Secondary | ICD-10-CM | POA: Diagnosis not present

## 2023-10-28 DIAGNOSIS — K219 Gastro-esophageal reflux disease without esophagitis: Secondary | ICD-10-CM

## 2023-10-28 DIAGNOSIS — E2839 Other primary ovarian failure: Secondary | ICD-10-CM

## 2023-10-28 DIAGNOSIS — D508 Other iron deficiency anemias: Secondary | ICD-10-CM

## 2023-10-28 DIAGNOSIS — E785 Hyperlipidemia, unspecified: Secondary | ICD-10-CM

## 2023-10-28 DIAGNOSIS — F411 Generalized anxiety disorder: Secondary | ICD-10-CM

## 2023-10-28 DIAGNOSIS — Z8614 Personal history of Methicillin resistant Staphylococcus aureus infection: Secondary | ICD-10-CM

## 2023-10-28 DIAGNOSIS — R002 Palpitations: Secondary | ICD-10-CM

## 2023-10-28 NOTE — Assessment & Plan Note (Signed)
 Improved!  Continue amitriptyline  10 mg HS.

## 2023-10-28 NOTE — Assessment & Plan Note (Signed)
Controlled. ? ?Continue omeprazole 20 mg BID.  ?

## 2023-10-28 NOTE — Assessment & Plan Note (Signed)
 Following with hematology, labs reviewed from May 2025. Follow up in July as scheduled.

## 2023-10-28 NOTE — Assessment & Plan Note (Signed)
 Controlled. Reviewed lipid panel from July 2025.  Continue fenofibrate  134 mg daily.

## 2023-10-28 NOTE — Assessment & Plan Note (Signed)
 For MRSA prevention.  Continue Bactrim  DS tablets BID.

## 2023-10-28 NOTE — Assessment & Plan Note (Signed)
Controlled.   Continue gabapentin 300 mg HS.  

## 2023-10-28 NOTE — Assessment & Plan Note (Signed)
 Controlled.  Continue Bactrim  DS tablets BID.

## 2023-10-28 NOTE — Assessment & Plan Note (Signed)
 Following with pulmonology, hematology, and cardiology.  Reviewed CT coronary scan from July 2025.  Reviewed iron studies from May 2025.

## 2023-10-28 NOTE — Assessment & Plan Note (Signed)
 Immunizations UTD.  Mammogram UTD Bone density scan due, orders placed. Colonoscopy N/A give age  Discussed the importance of a healthy diet and regular exercise in order for weight loss, and to reduce the risk of further co-morbidity.  Exam stable. Labs pending.  Follow up in 1 year for repeat physical.

## 2023-10-28 NOTE — Patient Instructions (Signed)
 Stop by the lab prior to leaving today. I will notify you of your results once received.   [facility].  Schedule a lab only appointment for 1 month to check your thyroid .  It was a pleasure to see you today!

## 2023-10-28 NOTE — Assessment & Plan Note (Addendum)
 TSH above goal. She is taking levothyroxine  correctly.  Continue levothyroxine  50 mcg daily.  Repeat TSH in 1 month.

## 2023-10-28 NOTE — Assessment & Plan Note (Signed)
 Controlled.  Continue metoprolol  titrate 25 mg twice daily.

## 2023-10-28 NOTE — Assessment & Plan Note (Signed)
 Controlled.  Continue off Zoloft .

## 2023-10-28 NOTE — Progress Notes (Signed)
 Subjective:    Patient ID: JERZEE JEROME, female    DOB: 10-30-1935, 88 y.o.   MRN: [id number]  HPI  [name] [name] is a very pleasant 88 y.o. female with a history of bilateral PE, hypothyroidism, osteoarthritis, mitral regurgitation, exertional dyspnea, MRSA infection, iron deficiency anemia who presents today for complete physical and follow up of chronic conditions.  Immunizations: -Tetanus: Completed in 2019  -Shingles:  -Pneumonia: Completed Prevnar 13 in 2020, Pneumovax 23 in 2019  Diet: Fair diet.  Exercise: No regular exercise.  Eye exam: Completes annually  Dental exam: Completes semi-annually    Mammogram: Completed in February 2025 Bone Density Scan: Completed in October 2022  Colonoscopy: Completed in 2024, no further screening given age.  BP Readings from Last 3 Encounters:  10/28/23 (!) 122/58  10/16/23 (!) 129/56  09/24/23 122/65       Review of Systems  Constitutional:  Negative for unexpected weight change.  HENT:  Negative for rhinorrhea.   Respiratory:  Negative for cough and shortness of breath.   Cardiovascular:  Negative for chest pain.  Gastrointestinal:  Negative for constipation and diarrhea.  Genitourinary:  Negative for difficulty urinating.  Musculoskeletal:  Positive for arthralgias.  Skin:  Negative for rash.  Allergic/Immunologic: Negative for environmental allergies.  Neurological:  Negative for dizziness and headaches.  Psychiatric/Behavioral:  The patient is not nervous/anxious.          Past Medical History:  Diagnosis Date   Anxiety    prev on prozac   Bilateral pulmonary embolism (HCC) 08/22/2021   Chest pain    Felt to be noncardiac in the past   Chronic rectal fissure    Colon polyp    Dyslipidemia    Fibromyalgia    GERD (gastroesophageal reflux disease)    Barrett's esophagus   Hiatal hernia    Lumbar spine pain    Complex lumbar spine surgery at [facility], 2012, complicated, MRSA, much of the appliance is removed,  patient on chronic antibiotics   Melanoma (HCC)    Mitral regurgitation    a. 2007 Echo Mild MR; b. 10/2016 Echo: EF 55-60%, no rwma, GrI DD, Mild MR.   Nausea and vomiting 08/22/2021   Osteoarthritis    Scoliosis    Sinusitis    Multiple sinus operations in the past   Statin intolerance    As of 2009, no further attempts to use statins   Thyroid  nodule     Social History   Socioeconomic History   Marital status: Married    Spouse name: Not on file   Number of children: Not on file   Years of education: Not on file   Highest education level: Not on file  Occupational History   Not on file  Tobacco Use   Smoking status: Never    Passive exposure: Never   Smokeless tobacco: Never  Vaping Use   Vaping status: Never Used  Substance and Sexual Activity   Alcohol use: No   Drug use: No   Sexual activity: Not on file  Other Topics Concern   Not on file  Social History Narrative   From [place]   Married (second [phone]   3 kids, 2 of whom are local   Retired, Tree surgeon   Social Drivers of Health   Financial Resource Strain: Low Risk  (10/08/2023)   Overall Financial Resource Strain (CARDIA)    Difficulty of Paying Living Expenses: Not hard at all  Food Insecurity: No Food  Insecurity (10/08/2023)   Hunger Vital Sign    Worried About Running Out of Food in the Last Year: Never true    Ran Out of Food in the Last Year: Never true  Transportation Needs: No Transportation Needs (10/08/2023)   PRAPARE - Administrator, Civil Service (Medical): No    Lack of Transportation (Non-Medical): No  Physical Activity: Insufficiently Active (10/08/2023)   Exercise Vital Sign    Days of Exercise per Week: 3 days    Minutes of Exercise per Session: 30 min  Stress: No Stress Concern Present (10/08/2023)   [employer] of Occupational Health - Occupational Stress Questionnaire    Feeling of Stress: Not at all  Social Connections: Moderately Isolated  (10/08/2023)   Social Connection and Isolation Panel    Frequency of Communication with Friends and Family: More than three times a week    Frequency of Social Gatherings with Friends and Family: More than three times a week    Attends Religious Services: Never    Database administrator or Organizations: No    Attends Banker Meetings: Never    Marital Status: Married  Catering manager Violence: Not At Risk (10/08/2023)   Humiliation, Afraid, Rape, and Kick questionnaire    Fear of Current or Ex-Partner: No    Emotionally Abused: No    Physically Abused: No    Sexually Abused: No    Past Surgical History:  Procedure Laterality Date   ABDOMINAL HYSTERECTOMY     BACK SURGERY     CHOLECYSTECTOMY     COLONOSCOPY WITH PROPOFOL  N/A 08/07/2022   Procedure: COLONOSCOPY WITH PROPOFOL ;  Surgeon: Therisa Bi, MD;  Location: [facility] ENDOSCOPY;  Service: Gastroenterology;  Laterality: N/A;   ESOPHAGOGASTRODUODENOSCOPY (EGD) WITH PROPOFOL  N/A 08/07/2022   Procedure: ESOPHAGOGASTRODUODENOSCOPY (EGD) WITH PROPOFOL ;  Surgeon: Therisa Bi, MD;  Location: [facility] ENDOSCOPY;  Service: Gastroenterology;  Laterality: N/A;   FOOT SURGERY     GIVENS CAPSULE STUDY N/A 10/09/2022   Procedure: GIVENS CAPSULE STUDY;  Surgeon: Therisa Bi, MD;  Location: [facility] ENDOSCOPY;  Service: Gastroenterology;  Laterality: N/A;   HERNIA REPAIR     IR BONE MARROW BIOPSY & ASPIRATION  06/02/2023   KNEE SURGERY Right 08/08/2021   total knee replacement   NASAL SINUS SURGERY      Family History  Problem Relation Age of Onset   GER disease Mother    Dementia Mother    Breast cancer Mother 62   Heart disease Father        MI at 43   Scoliosis Daughter    Colon cancer Neg Hx     Allergies  Allergen Reactions   Other Other (See Comments)    Other Reaction: when taking for an extended period of time causes mouth ulcers and esophagus irritation Other Reaction: when taking for an extended period of time causes mouth  ulcers and esophagus irritation    Fenofibric Acid Other (See Comments)    GI upset   Nsaids     REACTION: No long term use   Statins Other (See Comments)    Intolerant, myalgias Intolerant, myalgias Intolerant, myalgias Intolerant, myalgias Intolerant, myalgias Intolerant, myalgias    Trilipix [Choline Fenofibrate ]     GI upset   Wasp Venom Swelling    Current Outpatient Medications on File Prior to Visit  Medication Sig Dispense Refill   ALPHA LIPOIC ACID PO Take 1 tablet by mouth daily. Daily     amitriptyline  (ELAVIL ) 10 MG  tablet TAKE 1 TABLET (10 MG TOTAL) BY MOUTH AT BEDTIME FOR HEADACHE PREVENTION 90 tablet 2   calcium carbonate (OS-CAL) 600 MG TABS Take 1,200 mg by mouth 2 (two) times daily with a meal.     cholecalciferol (VITAMIN D) 1000 UNITS tablet Take 1,000 Units by mouth daily.     ciclopirox (LOPROX) 0.77 % cream Apply 1 g/mL topically 2 (two) times daily.     clotrimazole -betamethasone  (LOTRISONE ) cream APPLY TOPICALLY 2 (TWO) TIMES DAILY AS NEEDED. 30 g 0   diclofenac  sodium (VOLTAREN ) 1 % GEL Apply 2-4 grams to affected area up to 4 times daily 4 Tube 2   fenofibrate  micronized (LOFIBRA) 134 MG capsule TAKE 1 CAPSULE (134 MG TOTAL) BY MOUTH AT BEDTIME FOR CHOLESTEROL. 90 capsule 2   fish oil-omega-3 fatty acids 1000 MG capsule Take 2 g by mouth 2 (two) times daily.     gabapentin  (NEURONTIN ) 100 MG capsule TAKE 1 CAPSULE BY MOUTH THREE TIMES A DAY 270 capsule 0   Ginger, Zingiber officinalis, (GINGER PO) Take 1 tablet by mouth daily.     levothyroxine  (SYNTHROID ) 50 MCG tablet TAKE 1 TABLET BY MOUTH EVERY MORNING WITH WATER ON AN EMPTY STOMACH. NO OTHER FOODS/MEDS FOR 30 MINS 90 tablet 0   metoprolol  tartrate (LOPRESSOR ) 25 MG tablet TAKE 1 TAB BY MOUTH TWICE A DAY AND EXTRA TABLET AS NEEDED FOR BREAKTHROUGH PALPITATIONS AS DIRECTED 180 tablet 3   Multiple Vitamins-Minerals (MULTIVITAMIN WITH MINERALS) tablet Take 1 tablet by mouth daily.     NON FORMULARY Take  1 capsule by mouth daily. Tumeric      Daily     omeprazole  (PRILOSEC ) 20 MG capsule TAKE 2 CAPS (40 MG TOTAL) BY MOUTH IN THE MORNING AND AT BEDTIME. 360 capsule 0   sulfamethoxazole -trimethoprim  (BACTRIM  DS) 800-160 MG tablet TAKE 1 TABLET (160 MG OF TRIMETHOPRIM  TOTAL) BY MOUTH 2 (TWO) TIMES DAILY 180 tablet 0   tretinoin (RETIN-A) 0.05 % cream Apply 1 application topically daily as needed (as directed per dermatologist).   3   metoprolol  tartrate (LOPRESSOR ) 100 MG tablet TAKE 1 TABLET 2 HR PRIOR TO CARDIAC PROCEDURE (Patient not taking: Reported on 10/28/2023) 1 tablet 0   No current facility-administered medications on file prior to visit.    BP (!) 122/58   Pulse 87   Temp (!) 97.2 F (36.2 C) (Temporal)   Ht 4' 8 (1.422 m)   Wt 156 lb (70.8 kg)   SpO2 96%   BMI 34.97 kg/m  Objective:   Physical Exam HENT:     Right Ear: Tympanic membrane and ear canal normal.     Left Ear: Tympanic membrane and ear canal normal.  Eyes:     Pupils: Pupils are equal, round, and reactive to light.  Cardiovascular:     Rate and Rhythm: Normal rate and regular rhythm.  Pulmonary:     Effort: Pulmonary effort is normal.     Breath sounds: Normal breath sounds.  Abdominal:     General: Bowel sounds are normal.     Palpations: Abdomen is soft.     Tenderness: There is no abdominal tenderness.  Musculoskeletal:     Cervical back: Neck supple. Decreased range of motion.     Thoracic back: Decreased range of motion.     Lumbar back: Decreased range of motion.     Comments: Patient ambulates with upper body extended forward.  Skin:    General: Skin is warm and dry.  Neurological:  Mental Status: She is alert and oriented to person, place, and time.     Cranial Nerves: No cranial nerve deficit.     Deep Tendon Reflexes:     Reflex Scores:      Patellar reflexes are 2+ on the right side and 2+ on the left side. Psychiatric:        Mood and Affect: Mood normal.           Assessment  & Plan:  Preventative health care Assessment & Plan: Immunizations UTD.  Mammogram UTD Bone density scan due, orders placed. Colonoscopy N/A give age  Discussed the importance of a healthy diet and regular exercise in order for weight loss, and to reduce the risk of further co-morbidity.  Exam stable. Labs pending.  Follow up in 1 year for repeat physical.    Hypothyroidism, unspecified type Assessment & Plan: TSH above goal. She is taking levothyroxine  correctly.  Continue levothyroxine  50 mcg daily.  Repeat TSH in 1 month.  Orders: -     TSH; Future  Gastroesophageal reflux disease, unspecified whether esophagitis present Assessment & Plan: Controlled.  Continue omeprazole  20 mg BID.   Estrogen deficiency -     DG Bone Density; Future  Frequent headaches Assessment & Plan: Improved!  Continue amitriptyline  10 mg HS.   Fibromyalgia Assessment & Plan: Controlled.  Continue gabapentin  300 mg HS.    Exertional dyspnea Assessment & Plan: Following with pulmonology, hematology, and cardiology.  Reviewed CT coronary scan from July 2025.  Reviewed iron studies from May 2025.    Dyslipidemia Assessment & Plan: Controlled. Reviewed lipid panel from July 2025.  Continue fenofibrate  134 mg daily.    GAD (generalized anxiety disorder) Assessment & Plan: Controlled.  Continue off Zoloft .   History of MRSA infection Assessment & Plan: Controlled.  Continue Bactrim  DS tablets BID.   Other iron deficiency anemia Assessment & Plan: Following with hematology, labs reviewed from May 2025. Follow up in July as scheduled.    Long term current use of antibiotics Assessment & Plan: For MRSA prevention.  Continue Bactrim  DS tablets BID.   Palpitations Assessment & Plan: Controlled.  Continue metoprolol  titrate 25 mg twice daily.         Newell Frater K Soren Lazarz, NP

## 2023-10-30 ENCOUNTER — Ambulatory Visit
Admission: RE | Admit: 2023-10-30 | Discharge: 2023-10-30 | Disposition: A | Discharge status: Home / Self Care | Attending: Orthopedic Surgery | Admitting: Orthopedic Surgery

## 2023-10-30 ENCOUNTER — Ambulatory Visit: Admitting: Orthopedic Surgery

## 2023-10-30 ENCOUNTER — Encounter: Payer: Self-pay | Admitting: Orthopedic Surgery

## 2023-10-30 ENCOUNTER — Ambulatory Visit
Admission: RE | Admit: 2023-10-30 | Discharge: 2023-10-30 | Disposition: A | Discharge status: Home / Self Care | Source: Ambulatory Visit | Attending: Orthopedic Surgery | Admitting: Orthopedic Surgery

## 2023-10-30 VITALS — BP 136/70 | Ht <= 58 in | Wt 156.0 lb

## 2023-10-30 DIAGNOSIS — S12100S Unspecified displaced fracture of second cervical vertebra, sequela: Secondary | ICD-10-CM | POA: Insufficient documentation

## 2023-10-30 DIAGNOSIS — W19XXXD Unspecified fall, subsequent encounter: Secondary | ICD-10-CM

## 2023-10-30 DIAGNOSIS — M47812 Spondylosis without myelopathy or radiculopathy, cervical region: Secondary | ICD-10-CM | POA: Diagnosis not present

## 2023-10-30 DIAGNOSIS — S12100D Unspecified displaced fracture of second cervical vertebra, subsequent encounter for fracture with routine healing: Secondary | ICD-10-CM

## 2023-11-05 MED ORDER — CLOTRIMAZOLE-BETAMETHASONE 1-0.05 % EX CREA: TOPICAL_CREAM | Freq: Two times a day (BID) | CUTANEOUS | 0 refills | Status: AC | PRN

## 2023-11-07 ENCOUNTER — Other Ambulatory Visit: Payer: Self-pay | Admitting: Primary Care

## 2023-11-07 DIAGNOSIS — R519 Headache, unspecified: Secondary | ICD-10-CM

## 2023-11-07 DIAGNOSIS — E785 Hyperlipidemia, unspecified: Secondary | ICD-10-CM

## 2023-11-10 ENCOUNTER — Inpatient Hospital Stay: Attending: Internal Medicine

## 2023-11-10 DIAGNOSIS — D509 Iron deficiency anemia, unspecified: Secondary | ICD-10-CM | POA: Diagnosis not present

## 2023-11-10 DIAGNOSIS — D508 Other iron deficiency anemias: Secondary | ICD-10-CM

## 2023-11-10 LAB — CBC WITH DIFFERENTIAL (CANCER CENTER ONLY)
Abs Immature Granulocytes: 0.02 K/uL (ref 0.00–0.07)
Basophils Absolute: 0 K/uL (ref 0.0–0.1)
Basophils Relative: 1 %
Eosinophils Absolute: 0.1 K/uL (ref 0.0–0.5)
Eosinophils Relative: 2 %
HCT: 34.2 % — ABNORMAL LOW (ref 36.0–46.0)
Hemoglobin: 11.1 g/dL — ABNORMAL LOW (ref 12.0–15.0)
Immature Granulocytes: 1 %
Lymphocytes Relative: 28 %
Lymphs Abs: 1 K/uL (ref 0.7–4.0)
MCH: 32.4 pg (ref 26.0–34.0)
MCHC: 32.5 g/dL (ref 30.0–36.0)
MCV: 99.7 fL (ref 80.0–100.0)
Monocytes Absolute: 0.4 K/uL (ref 0.1–1.0)
Monocytes Relative: 10 %
Neutro Abs: 2.2 K/uL (ref 1.7–7.7)
Neutrophils Relative %: 58 %
Platelet Count: 192 K/uL (ref 150–400)
RBC: 3.43 MIL/uL — ABNORMAL LOW (ref 3.87–5.11)
RDW: 13.9 % (ref 11.5–15.5)
WBC Count: 3.7 K/uL — ABNORMAL LOW (ref 4.0–10.5)
nRBC: 0 % (ref 0.0–0.2)

## 2023-11-10 LAB — IRON AND TIBC
Iron: 69 ug/dL (ref 28–170)
Saturation Ratios: 16 % (ref 10.4–31.8)
TIBC: 445 ug/dL (ref 250–450)
UIBC: 376 ug/dL

## 2023-11-10 LAB — FERRITIN: Ferritin: 399 ng/mL — ABNORMAL HIGH (ref 11–307)

## 2023-11-24 ENCOUNTER — Ambulatory Visit (INDEPENDENT_AMBULATORY_CARE_PROVIDER_SITE_OTHER): Admitting: Physician Assistant

## 2023-11-24 ENCOUNTER — Encounter: Payer: Self-pay | Admitting: Physician Assistant

## 2023-11-24 DIAGNOSIS — M1712 Unilateral primary osteoarthritis, left knee: Secondary | ICD-10-CM

## 2023-11-24 MED ORDER — METHYLPREDNISOLONE ACETATE 40 MG/ML IJ SUSP
40.0000 mg | INTRAMUSCULAR | Status: AC | PRN
Start: 2023-11-24 — End: 2023-11-24
  Administered 2023-11-24 (×2): 40 mg via INTRA_ARTICULAR

## 2023-11-24 MED ORDER — LIDOCAINE HCL 1 % IJ SOLN
3.0000 mL | INTRAMUSCULAR | Status: AC | PRN
  Administered 2023-11-24 (×2): 3 mL

## 2023-11-24 NOTE — Progress Notes (Signed)
   Procedure Note  Patient: [name] [name]             Date of Birth: 12-19-1935           MRN: [id number]             Visit Date: 11/24/2023  PI: Mr. Brazell comes in today requesting cortisone injection left knee.  She has known osteoarthritis of the left knee.  No new injury.  Reports no fevers chills.  Physical exam: General Well-developed well-nourished female who walks with an antalgic gait.  Husband helps assist her in walking.  Left knee: Good range of motion.  No abnormal warmth erythema or effusion. Procedures: Visit Diagnoses:  1. Unilateral primary osteoarthritis, left knee     Large Joint Inj: L knee on 11/24/2023 11:59 AM Indications: pain Details: 22 G 1.5 in needle, anterolateral approach  Arthrogram: No  Medications: 3 mL lidocaine  1 %; 40 mg methylPREDNISolone  acetate 40 MG/ML Outcome: tolerated well, no immediate complications Procedure, treatment alternatives, risks and benefits explained, specific risks discussed. Consent was given by the patient. Immediately prior to procedure a time out was called to verify the correct patient, procedure, equipment, support staff and site/side marked as required. Patient was prepped and draped in the usual sterile fashion.     Plan: She knows to wait 3 months between injections.  She will follow-up with us  as needed.  Patient tolerated injection well today.

## 2023-11-28 ENCOUNTER — Other Ambulatory Visit

## 2023-12-05 ENCOUNTER — Other Ambulatory Visit: Payer: Self-pay | Admitting: Primary Care

## 2023-12-05 DIAGNOSIS — E039 Hypothyroidism, unspecified: Secondary | ICD-10-CM

## 2023-12-05 NOTE — Telephone Encounter (Signed)
 Patient is due for recheck thyroid  check, needs a lab appt for late September. Non fasting.   Please schedule, thank you!

## 2023-12-09 ENCOUNTER — Ambulatory Visit
Admission: RE | Admit: 2023-12-09 | Discharge: 2023-12-09 | Disposition: A | Discharge status: Home / Self Care | Source: Ambulatory Visit | Attending: Primary Care | Admitting: Primary Care

## 2023-12-09 DIAGNOSIS — M8589 Other specified disorders of bone density and structure, multiple sites: Secondary | ICD-10-CM | POA: Diagnosis not present

## 2023-12-09 DIAGNOSIS — E2839 Other primary ovarian failure: Secondary | ICD-10-CM | POA: Diagnosis not present

## 2023-12-09 DIAGNOSIS — Z780 Asymptomatic menopausal state: Secondary | ICD-10-CM | POA: Diagnosis not present

## 2023-12-11 ENCOUNTER — Ambulatory Visit: Payer: Self-pay | Admitting: Primary Care

## 2023-12-11 DIAGNOSIS — M8589 Other specified disorders of bone density and structure, multiple sites: Secondary | ICD-10-CM

## 2023-12-19 MED ORDER — ALENDRONATE SODIUM 70 MG PO TABS: 70.0000 mg | ORAL_TABLET | ORAL | 3 refills | Status: AC

## 2023-12-26 NOTE — Progress Notes (Deleted)
 Cardiology Clinic Note   Date: 12/26/2023 ID: [name] [name], DOB 01/01/1936, MRN [id number]  Primary Cardiologist:  Evalene Lunger, MD  Chief Complaint   [name] [name] is a 88 y.o. female who presents to the clinic today for ***  Patient Profile   [name] [name] is followed by *** for the history outlined below.       Past medical history significant for: Nonobstructive CAD.  Coronary CTA 10/16/2023: Coronary calcium score 451.  Mild proximal LAD stenosis. Mitral valve regurgitation. Echo 07/28/2023: EF 55 to 60%.  No RWMA.  Grade I DD.  Normal RV size/function.  Mild MR. Hyperlipidemia. Lipid panel 10/15/2022: LDL 67, HDL 49, TG 181, total 152. Palpitations. Iron deficiency anemia. Hiatal hernia. Fibromyalgia. RA.  In summary, patient has a history of stable mild to moderate mitral valve regurgitation.  Last echo October 2023 as detailed above. She has a long history of palpitations well controlled on metoprolol .   Patient was seen in the office by Dr. Gollan on 05/20/2022 for routine follow-up.  She complained of fatigue and dyspnea.  She had recently been diagnosed with iron deficiency anemia and PCP attributed symptoms to that.  She had an echo in October 2023 as above.   Patient was seen in the office on 06/09/2023 for routine follow-up.  She reported increased dyspnea with activities around the home.  She was undergoing a full workup for anemia including bone biopsy.  She was undergoing iron infusions.  She was also pending a visit to pulmonology.  She attributed dyspnea and fatigue to anemia.  She reported occasional palpitations described as heart racing that she felt were well-controlled with metoprolol . She had an echo in April which showed normal LV function and grade I DD.   Patient was last seen in the office by me on 09/24/2023 for follow up after echo. She reports fracturing her clavicle in April after a mechanical fall at home. Her hemoglobin decreased to 6.3 and she underwent  transfusion of 2 units PRBCs. She was also continued on iron infusions. She reported episodes of central chest pain sometimes relieved with Tums or Pepcid .  Coronary CTA demonstrated calcium score 451 with mild nonobstructive CAD.     History of Present Illness    Today, patient ***  Nonobstructive CAD Coronary CTA July 2025 showed calcium score 451 with mild proximal LAD stenosis.  Patient*** - Continue metoprolol , fenofibrate .  Not on statin secondary to intolerance.  Mitral valve regurgitation Echo April 2025 showed normal LV/RV function, Grade I DD, mild  MR.  Patient reports dyspnea that improves when hemoglobin improves. No dizziness or lightheadedness.*** - Repeat echo when clinically indicated.   Palpitations Patient reports palpitations described as heart racing are unchanged from previous.  Dr. Sheena -Continue metoprolol .   Mixed hyperlipidemia/statin intolerance Lipid panel July 2024 LDL 67, TG 181.  Patient with history of myalgias on statins.  She was unable to tolerate Zetia secondary to diarrhea.  PCP started fenofibrate  in August 2024. -Continue fenofibrate ***  ROS: All other systems reviewed and are otherwise negative except as noted in History of Present Illness.  EKGs/Labs Reviewed        09/10/2023: ALT 22; AST 29; BUN 26; Creatinine 0.76; Potassium 4.3; Sodium 137   11/10/2023: Hemoglobin 11.1; WBC Count 3.7   10/21/2023: TSH 6.24   No results found for requested labs within last 365 days.  ***  Risk Assessment/Calculations    {Does this patient have ATRIAL FIBRILLATION?:[phone]} No BP recorded.  {  Refresh Note OR Click here to enter BP  :1}***        Physical Exam    VS:  There were no vitals taken for this visit. , BMI There is no height or weight on file to calculate BMI.  GEN: Well nourished, well developed, in no acute distress. Neck: No JVD or carotid bruits. Cardiac: *** RRR. *** No murmur. No rubs or gallops.   Respiratory:  Respirations  regular and unlabored. Clear to auscultation without rales, wheezing or rhonchi. GI: Soft, nontender, nondistended. Extremities: Radials/DP/PT 2+ and equal bilaterally. No clubbing or cyanosis. No edema ***  Skin: Warm and dry, no rash. Neuro: Strength intact.  Assessment & Plan   ***  Disposition: ***     {Are you ordering a CV Procedure (e.g. stress test, cath, DCCV, TEE, etc)?   Press F2        :[id number]}   Signed, Barnie HERO. Darnel Mchan, DNP, NP-C

## 2023-12-29 ENCOUNTER — Encounter: Payer: Self-pay | Admitting: Oncology

## 2023-12-31 ENCOUNTER — Ambulatory Visit: Admitting: Student

## 2023-12-31 ENCOUNTER — Other Ambulatory Visit: Payer: Self-pay | Admitting: Oncology

## 2024-01-05 ENCOUNTER — Other Ambulatory Visit (INDEPENDENT_AMBULATORY_CARE_PROVIDER_SITE_OTHER)

## 2024-01-05 DIAGNOSIS — E039 Hypothyroidism, unspecified: Secondary | ICD-10-CM

## 2024-01-05 LAB — TSH: TSH: 2 u[IU]/mL (ref 0.35–5.50)

## 2024-01-12 ENCOUNTER — Inpatient Hospital Stay: Attending: Internal Medicine

## 2024-01-12 DIAGNOSIS — D509 Iron deficiency anemia, unspecified: Secondary | ICD-10-CM | POA: Diagnosis not present

## 2024-01-12 DIAGNOSIS — D508 Other iron deficiency anemias: Secondary | ICD-10-CM

## 2024-01-12 LAB — CBC WITH DIFFERENTIAL (CANCER CENTER ONLY)
Abs Immature Granulocytes: 0.01 K/uL (ref 0.00–0.07)
Basophils Absolute: 0 K/uL (ref 0.0–0.1)
Basophils Relative: 1 %
Eosinophils Absolute: 0.1 K/uL (ref 0.0–0.5)
Eosinophils Relative: 2 %
HCT: 34.5 % — ABNORMAL LOW (ref 36.0–46.0)
Hemoglobin: 11.1 g/dL — ABNORMAL LOW (ref 12.0–15.0)
Immature Granulocytes: 0 %
Lymphocytes Relative: 26 %
Lymphs Abs: 1.1 K/uL (ref 0.7–4.0)
MCH: 31.4 pg (ref 26.0–34.0)
MCHC: 32.2 g/dL (ref 30.0–36.0)
MCV: 97.5 fL (ref 80.0–100.0)
Monocytes Absolute: 0.4 K/uL (ref 0.1–1.0)
Monocytes Relative: 11 %
Neutro Abs: 2.5 K/uL (ref 1.7–7.7)
Neutrophils Relative %: 60 %
Platelet Count: 211 K/uL (ref 150–400)
RBC: 3.54 MIL/uL — ABNORMAL LOW (ref 3.87–5.11)
RDW: 12.9 % (ref 11.5–15.5)
WBC Count: 4.1 K/uL (ref 4.0–10.5)
nRBC: 0 % (ref 0.0–0.2)

## 2024-01-12 LAB — IRON AND TIBC
Iron: 81 ug/dL (ref 28–170)
Saturation Ratios: 19 % (ref 10.4–31.8)
TIBC: 417 ug/dL (ref 250–450)
UIBC: 336 ug/dL

## 2024-01-12 LAB — FERRITIN: Ferritin: 240 ng/mL (ref 11–307)

## 2024-01-15 DIAGNOSIS — K080 Exfoliation of teeth due to systemic causes: Secondary | ICD-10-CM | POA: Diagnosis not present

## 2024-01-21 ENCOUNTER — Other Ambulatory Visit: Payer: Self-pay | Admitting: Physician Assistant

## 2024-01-23 ENCOUNTER — Other Ambulatory Visit: Payer: Self-pay | Admitting: Primary Care

## 2024-01-23 DIAGNOSIS — Z8614 Personal history of Methicillin resistant Staphylococcus aureus infection: Secondary | ICD-10-CM

## 2024-02-13 ENCOUNTER — Encounter: Payer: Self-pay | Admitting: Oncology

## 2024-02-14 ENCOUNTER — Other Ambulatory Visit: Payer: Self-pay | Admitting: Primary Care

## 2024-02-14 DIAGNOSIS — E039 Hypothyroidism, unspecified: Secondary | ICD-10-CM

## 2024-02-16 ENCOUNTER — Encounter: Payer: Self-pay | Admitting: Radiology

## 2024-02-25 ENCOUNTER — Ambulatory Visit (INDEPENDENT_AMBULATORY_CARE_PROVIDER_SITE_OTHER): Admitting: Physician Assistant

## 2024-02-25 ENCOUNTER — Encounter: Payer: Self-pay | Admitting: Physician Assistant

## 2024-02-25 DIAGNOSIS — M1712 Unilateral primary osteoarthritis, left knee: Secondary | ICD-10-CM

## 2024-02-25 MED ORDER — LIDOCAINE HCL 1 % IJ SOLN
3.0000 mL | INTRAMUSCULAR | Status: AC | PRN
  Administered 2024-02-25: 3 mL

## 2024-02-25 MED ORDER — METHYLPREDNISOLONE ACETATE 40 MG/ML IJ SUSP
40.0000 mg | INTRAMUSCULAR | Status: AC | PRN
  Administered 2024-02-25: 40 mg via INTRA_ARTICULAR

## 2024-02-25 NOTE — Progress Notes (Signed)
   Procedure Note  Patient: [name] [name]             Date of Birth: 07/28/1935           MRN: [id number]             Visit Date: 02/25/2024 HPI: Mrs. Frogge comes in today requesting cortisone injection left knee.  She was last seen on 11/24/2023 and was given a cortisone injection and states that this helped until recently.  No new injuries.  No fevers chills.  She is nondiabetic.  Left knee: Good range of motion.  Tenderness medial joint line.  No gross instability valgus varus stressing.  No abnormal warmth erythema or effusion.  Procedures: Visit Diagnoses:  1. Unilateral primary osteoarthritis, left knee     Large Joint Inj: L knee on 02/25/2024 11:28 AM Indications: pain Details: 22 G 1.5 in needle, anterolateral approach  Arthrogram: No  Medications: 3 mL lidocaine  1 %; 40 mg methylPREDNISolone  acetate 40 MG/ML Outcome: tolerated well, no immediate complications Procedure, treatment alternatives, risks and benefits explained, specific risks discussed. Consent was given by the patient. Immediately prior to procedure a time out was called to verify the correct patient, procedure, equipment, support staff and site/side marked as required. Patient was prepped and draped in the usual sterile fashion.     Plan: She knows to wait at least 3 months between injections.  She will follow-up with us  as needed.  Questions were encouraged and answered.

## 2024-03-15 ENCOUNTER — Inpatient Hospital Stay: Admitting: Oncology

## 2024-03-15 ENCOUNTER — Other Ambulatory Visit: Payer: Self-pay

## 2024-03-15 ENCOUNTER — Encounter: Payer: Self-pay | Admitting: Oncology

## 2024-03-15 ENCOUNTER — Inpatient Hospital Stay: Attending: Internal Medicine

## 2024-03-15 VITALS — BP 124/62 | HR 76 | Temp 97.0°F | Resp 16 | Ht <= 58 in | Wt 154.2 lb

## 2024-03-15 DIAGNOSIS — D509 Iron deficiency anemia, unspecified: Secondary | ICD-10-CM | POA: Insufficient documentation

## 2024-03-15 DIAGNOSIS — E538 Deficiency of other specified B group vitamins: Secondary | ICD-10-CM | POA: Insufficient documentation

## 2024-03-15 DIAGNOSIS — Z791 Long term (current) use of non-steroidal anti-inflammatories (NSAID): Secondary | ICD-10-CM | POA: Insufficient documentation

## 2024-03-15 DIAGNOSIS — Z79890 Hormone replacement therapy (postmenopausal): Secondary | ICD-10-CM | POA: Diagnosis not present

## 2024-03-15 DIAGNOSIS — E785 Hyperlipidemia, unspecified: Secondary | ICD-10-CM | POA: Insufficient documentation

## 2024-03-15 DIAGNOSIS — K219 Gastro-esophageal reflux disease without esophagitis: Secondary | ICD-10-CM | POA: Insufficient documentation

## 2024-03-15 DIAGNOSIS — Z860100 Personal history of colon polyps, unspecified: Secondary | ICD-10-CM | POA: Insufficient documentation

## 2024-03-15 DIAGNOSIS — Z800 Family history of malignant neoplasm of digestive organs: Secondary | ICD-10-CM | POA: Diagnosis not present

## 2024-03-15 DIAGNOSIS — Z803 Family history of malignant neoplasm of breast: Secondary | ICD-10-CM | POA: Diagnosis not present

## 2024-03-15 DIAGNOSIS — Z85820 Personal history of malignant melanoma of skin: Secondary | ICD-10-CM | POA: Insufficient documentation

## 2024-03-15 DIAGNOSIS — Z86711 Personal history of pulmonary embolism: Secondary | ICD-10-CM | POA: Diagnosis not present

## 2024-03-15 DIAGNOSIS — M1990 Unspecified osteoarthritis, unspecified site: Secondary | ICD-10-CM | POA: Diagnosis not present

## 2024-03-15 DIAGNOSIS — D508 Other iron deficiency anemias: Secondary | ICD-10-CM

## 2024-03-15 DIAGNOSIS — D649 Anemia, unspecified: Secondary | ICD-10-CM

## 2024-03-15 DIAGNOSIS — Z7983 Long term (current) use of bisphosphonates: Secondary | ICD-10-CM | POA: Insufficient documentation

## 2024-03-15 DIAGNOSIS — M797 Fibromyalgia: Secondary | ICD-10-CM | POA: Diagnosis not present

## 2024-03-15 DIAGNOSIS — I340 Nonrheumatic mitral (valve) insufficiency: Secondary | ICD-10-CM | POA: Diagnosis not present

## 2024-03-15 DIAGNOSIS — Z79899 Other long term (current) drug therapy: Secondary | ICD-10-CM | POA: Diagnosis not present

## 2024-03-15 LAB — CBC WITH DIFFERENTIAL (CANCER CENTER ONLY)
Abs Immature Granulocytes: 0.02 K/uL (ref 0.00–0.07)
Basophils Absolute: 0 K/uL (ref 0.0–0.1)
Basophils Relative: 1 %
Eosinophils Absolute: 0.1 K/uL (ref 0.0–0.5)
Eosinophils Relative: 2 %
HCT: 36.9 % (ref 36.0–46.0)
Hemoglobin: 11.9 g/dL — ABNORMAL LOW (ref 12.0–15.0)
Immature Granulocytes: 1 %
Lymphocytes Relative: 24 %
Lymphs Abs: 0.9 K/uL (ref 0.7–4.0)
MCH: 31.2 pg (ref 26.0–34.0)
MCHC: 32.2 g/dL (ref 30.0–36.0)
MCV: 96.9 fL (ref 80.0–100.0)
Monocytes Absolute: 0.3 K/uL (ref 0.1–1.0)
Monocytes Relative: 8 %
Neutro Abs: 2.6 K/uL (ref 1.7–7.7)
Neutrophils Relative %: 64 %
Platelet Count: 193 K/uL (ref 150–400)
RBC: 3.81 MIL/uL — ABNORMAL LOW (ref 3.87–5.11)
RDW: 13 % (ref 11.5–15.5)
WBC Count: 3.9 K/uL — ABNORMAL LOW (ref 4.0–10.5)
nRBC: 0 % (ref 0.0–0.2)

## 2024-03-15 LAB — IRON AND TIBC
Iron: 78 ug/dL (ref 28–170)
Saturation Ratios: 17 % (ref 10.4–31.8)
TIBC: 458 ug/dL — ABNORMAL HIGH (ref 250–450)
UIBC: 380 ug/dL

## 2024-03-15 LAB — FERRITIN: Ferritin: 322 ng/mL — ABNORMAL HIGH (ref 11–307)

## 2024-03-15 NOTE — Progress Notes (Signed)
 Hematology/Oncology Consult note [facility]  [phone] [phone]  Patient Care Team: Gretta Comer POUR, NP as PCP - General (Internal Medicine) Perla Evalene PARAS, MD as PCP - Cardiology (Cardiology) Melanee Annah BROCKS, MD as Consulting Physician (Oncology) Enola Feliciano Hugger, MD as Consulting Physician (Ophthalmology)   Name of the patient: [name] [name]  [id number]  July 28, 1935   Date of visit: 03/15/24  Diagnosis-iron deficiency anemia  Chief complaint/ Reason for visit-routine follow-up of iron deficiency anemia  Heme/Onc history:  Patient is a 88 year old female who was transferring her care from Dr. Brutus for history of iron deficiency anemia.  Her past medical history significant for hypertension hyperlipidemia GERD anxiety fibromyalgia and osteoarthritis.  She has received Feraheme  in the past.  She had colonoscopy with Dr. Therisa in April 2024 which showed colonic polyps that were consistent with tubular adenoma.  Upper endoscopy showed 2 superficial nonbleeding gastric ulcers for which she is on Prilosec .  Capsule endoscopy in July 2024 showed minimal blood-tinged material for a few seconds.  If she were to continue to have evidence of iron deficiency anemia consideration would need to be given for balloon enteroscopy.  Patient also had bone marrow biopsy in February 2025 which showed hypercellular bone marrow with orderly trilineage hematopoiesis and no evidence of dysplasia.   Interval history-patient is doing well presently for her age.  No recent hospitalizations.  Denies any blood loss in her stool or urine.  ECOG PS- 2 Pain scale- 0  Review of systems- Review of Systems  Constitutional:  Negative for chills, fever, malaise/fatigue and weight loss.  HENT:  Negative for congestion, ear discharge and nosebleeds.   Eyes:  Negative for blurred vision.  Respiratory:  Negative for cough, hemoptysis, sputum production, shortness of  breath and wheezing.   Cardiovascular:  Negative for chest pain, palpitations, orthopnea and claudication.  Gastrointestinal:  Negative for abdominal pain, blood in stool, constipation, diarrhea, heartburn, melena, nausea and vomiting.  Genitourinary:  Negative for dysuria, flank pain, frequency, hematuria and urgency.  Musculoskeletal:  Positive for joint pain. Negative for back pain and myalgias.  Skin:  Negative for rash.  Neurological:  Negative for dizziness, tingling, focal weakness, seizures, weakness and headaches.  Endo/Heme/Allergies:  Does not bruise/bleed easily.  Psychiatric/Behavioral:  Negative for depression and suicidal ideas. The patient does not have insomnia.       Allergies  Allergen Reactions   Other Other (See Comments)    Other Reaction: when taking for an extended period of time causes mouth ulcers and esophagus irritation Other Reaction: when taking for an extended period of time causes mouth ulcers and esophagus irritation    Fenofibric Acid Other (See Comments)    GI upset   Nsaids     REACTION: No long term use   Statins Other (See Comments)    Intolerant, myalgias Intolerant, myalgias Intolerant, myalgias Intolerant, myalgias Intolerant, myalgias Intolerant, myalgias    Trilipix [Choline Fenofibrate ]     GI upset   Wasp Venom Swelling     Past Medical History:  Diagnosis Date   Anxiety    prev on prozac   Bilateral pulmonary embolism (HCC) 08/22/2021   Chest pain    Felt to be noncardiac in the past   Chronic rectal fissure    Colon polyp    Dyslipidemia    Fibromyalgia    GERD (gastroesophageal reflux disease)    Barrett's esophagus   Hiatal hernia    Lumbar spine pain  Complex lumbar spine surgery at [facility], 2012, complicated, MRSA, much of the appliance is removed, patient on chronic antibiotics   Melanoma (HCC)    Mitral regurgitation    a. 2007 Echo Mild MR; b. 10/2016 Echo: EF 55-60%, no rwma, GrI DD, Mild MR.   Nausea and  vomiting 08/22/2021   Osteoarthritis    Scoliosis    Sinusitis    Multiple sinus operations in the past   Statin intolerance    As of 2009, no further attempts to use statins   Thyroid  nodule      Past Surgical History:  Procedure Laterality Date   ABDOMINAL HYSTERECTOMY     BACK SURGERY     CHOLECYSTECTOMY     COLONOSCOPY WITH PROPOFOL  N/A 08/07/2022   Procedure: COLONOSCOPY WITH PROPOFOL ;  Surgeon: Therisa Bi, MD;  Location: [facility] ENDOSCOPY;  Service: Gastroenterology;  Laterality: N/A;   ESOPHAGOGASTRODUODENOSCOPY (EGD) WITH PROPOFOL  N/A 08/07/2022   Procedure: ESOPHAGOGASTRODUODENOSCOPY (EGD) WITH PROPOFOL ;  Surgeon: Therisa Bi, MD;  Location: [facility] ENDOSCOPY;  Service: Gastroenterology;  Laterality: N/A;   FOOT SURGERY     GIVENS CAPSULE STUDY N/A 10/09/2022   Procedure: GIVENS CAPSULE STUDY;  Surgeon: Therisa Bi, MD;  Location: [facility] ENDOSCOPY;  Service: Gastroenterology;  Laterality: N/A;   HERNIA REPAIR     IR BONE MARROW BIOPSY & ASPIRATION  06/02/2023   KNEE SURGERY Right 08/08/2021   total knee replacement   NASAL SINUS SURGERY      Social History   Socioeconomic History   Marital status: Married    Spouse name: Not on file   Number of children: Not on file   Years of education: Not on file   Highest education level: Not on file  Occupational History   Not on file  Tobacco Use   Smoking status: Never    Passive exposure: Never   Smokeless tobacco: Never  Vaping Use   Vaping status: Never Used  Substance and Sexual Activity   Alcohol use: No   Drug use: No   Sexual activity: Not on file  Other Topics Concern   Not on file  Social History Narrative   From [place]   Married (second [phone]   3 kids, 2 of whom are local   Retired, tree surgeon   Social Drivers of Health   Financial Resource Strain: Low Risk  (10/08/2023)   Overall Financial Resource Strain (CARDIA)    Difficulty of Paying Living Expenses: Not hard at all  Food Insecurity:  No Food Insecurity (10/08/2023)   Hunger Vital Sign    Worried About Running Out of Food in the Last Year: Never true    Ran Out of Food in the Last Year: Never true  Transportation Needs: No Transportation Needs (10/08/2023)   PRAPARE - Administrator, Civil Service (Medical): No    Lack of Transportation (Non-Medical): No  Physical Activity: Insufficiently Active (10/08/2023)   Exercise Vital Sign    Days of Exercise per Week: 3 days    Minutes of Exercise per Session: 30 min  Stress: No Stress Concern Present (10/08/2023)   [employer] of Occupational Health - Occupational Stress Questionnaire    Feeling of Stress: Not at all  Social Connections: Moderately Isolated (10/08/2023)   Social Connection and Isolation Panel    Frequency of Communication with Friends and Family: More than three times a week    Frequency of Social Gatherings with Friends and Family: More than three times a week  Attends Religious Services: Never    Active Member of Clubs or Organizations: No    Attends Banker Meetings: Never    Marital Status: Married  Catering Manager Violence: Not At Risk (10/08/2023)   Humiliation, Afraid, Rape, and Kick questionnaire    Fear of Current or Ex-Partner: No    Emotionally Abused: No    Physically Abused: No    Sexually Abused: No    Family History  Problem Relation Age of Onset   GER disease Mother    Dementia Mother    Breast cancer Mother 67   Heart disease Father        MI at 108   Scoliosis Daughter    Colon cancer Neg Hx      Current Outpatient Medications:    alendronate  (FOSAMAX ) 70 MG tablet, Take 1 tablet (70 mg total) by mouth every 7 (seven) days. Take with a full glass of water on an empty stomach. Avoid laying flat for 2 hours., Disp: 12 tablet, Rfl: 3   ALPHA LIPOIC ACID PO, Take 1 tablet by mouth daily. Daily, Disp: , Rfl:    amitriptyline  (ELAVIL ) 10 MG tablet, TAKE 1 TABLET (10 MG TOTAL) BY MOUTH AT BEDTIME FOR  HEADACHE PREVENTION, Disp: 90 tablet, Rfl: 3   calcium carbonate (OS-CAL) 600 MG TABS, Take 1,200 mg by mouth 2 (two) times daily with a meal., Disp: , Rfl:    cholecalciferol (VITAMIN D) 1000 UNITS tablet, Take 1,000 Units by mouth daily., Disp: , Rfl:    ciclopirox (LOPROX) 0.77 % cream, Apply 1 g/mL topically 2 (two) times daily., Disp: , Rfl:    clotrimazole -betamethasone  (LOTRISONE ) cream, Apply topically 2 (two) times daily as needed., Disp: 30 g, Rfl: 0   fenofibrate  micronized (LOFIBRA) 134 MG capsule, TAKE 1 CAPSULE (134 MG TOTAL) BY MOUTH AT BEDTIME FOR CHOLESTEROL., Disp: 90 capsule, Rfl: 2   fish oil-omega-3 fatty acids 1000 MG capsule, Take 2 g by mouth 2 (two) times daily., Disp: , Rfl:    gabapentin  (NEURONTIN ) 100 MG capsule, TAKE 1 CAPSULE BY MOUTH THREE TIMES A DAY, Disp: 270 capsule, Rfl: 0   Ginger, Zingiber officinalis, (GINGER PO), Take 1 tablet by mouth daily., Disp: , Rfl:    levothyroxine  (SYNTHROID ) 50 MCG tablet, TAKE 1 TABLET BY MOUTH EVERY MORNING WITH WATER ON AN EMPTY STOMACH. NO OTHER FOODS/MEDS FOR 30 MINS, Disp: 90 tablet, Rfl: 1   metoprolol  tartrate (LOPRESSOR ) 25 MG tablet, TAKE 1 TAB BY MOUTH TWICE A DAY AND EXTRA TABLET AS NEEDED FOR BREAKTHROUGH PALPITATIONS AS DIRECTED (Patient taking differently: 2 (two) times daily. TAKE 1 TAB BY MOUTH TWICE A DAY AND EXTRA TABLET AS NEEDED FOR BREAKTHROUGH PALPITATIONS AS DIRECTED), Disp: 180 tablet, Rfl: 3   Multiple Vitamins-Minerals (MULTIVITAMIN WITH MINERALS) tablet, Take 1 tablet by mouth daily., Disp: , Rfl:    NON FORMULARY, Take 1 capsule by mouth daily. Tumeric      Daily, Disp: , Rfl:    sulfamethoxazole -trimethoprim  (BACTRIM  DS) 800-160 MG tablet, TAKE 1 TABLET (160 MG OF TRIMETHOPRIM  TOTAL) BY MOUTH 2 (TWO) TIMES DAILY, Disp: 180 tablet, Rfl: 2   tretinoin (RETIN-A) 0.05 % cream, Apply 1 application topically daily as needed (as directed per dermatologist). , Disp: , Rfl: 3   diclofenac  sodium (VOLTAREN ) 1 %  GEL, Apply 2-4 grams to affected area up to 4 times daily (Patient not taking: Reported on 03/15/2024), Disp: 4 Tube, Rfl: 2   metoprolol  tartrate (LOPRESSOR ) 100 MG tablet, TAKE 1  TABLET 2 HR PRIOR TO CARDIAC PROCEDURE (Patient not taking: Reported on 03/15/2024), Disp: 1 tablet, Rfl: 0  Physical exam:  Vitals:   03/15/24 1419  BP: 124/62  Pulse: 76  Resp: 16  Temp: (!) 97 F (36.1 C)  TempSrc: Tympanic  SpO2: 95%  Weight: 154 lb 3.2 oz (69.9 kg)  Height: 4' 8 (1.422 m)   Physical Exam Cardiovascular:     Rate and Rhythm: Normal rate and regular rhythm.     Heart sounds: Normal heart sounds.  Pulmonary:     Effort: Pulmonary effort is normal.     Breath sounds: Normal breath sounds.  Skin:    General: Skin is warm and dry.  Neurological:     Mental Status: She is alert and oriented to person, place, and time.      I have personally reviewed labs listed below:    Latest Ref Rng & Units 09/10/2023    2:57 PM  CMP  Glucose 70 - 99 mg/dL 857   BUN 8 - 23 mg/dL 26   Creatinine 9.55 - 1.00 mg/dL 9.23   Sodium 864 - 854 mmol/L 137   Potassium 3.5 - 5.1 mmol/L 4.3   Chloride 98 - 111 mmol/L 104   CO2 22 - 32 mmol/L 26   Calcium 8.9 - 10.3 mg/dL 8.5   Total Protein 6.5 - 8.1 g/dL 6.0   Total Bilirubin 0.0 - 1.2 mg/dL 0.6   Alkaline Phos 38 - 126 U/L 57   AST 15 - 41 U/L 29   ALT 0 - 44 U/L 22       Latest Ref Rng & Units 03/15/2024    1:57 PM  CBC  WBC 4.0 - 10.5 K/uL 3.9   Hemoglobin 12.0 - 15.0 g/dL 88.0   Hematocrit 63.9 - 46.0 % 36.9   Platelets 150 - 400 K/uL 193     Assessment and plan- Patient is a 88 y.o. female here for routine follow-up of iron deficiency anemia  Patient's baseline hemoglobin runs around 11 and is presently 11.9.  She last received IV iron in May 2025.  Iron studies from today are pending.  If her ferritin is less than 50 we will give her a call and schedule her for IV iron.  Repeat CBC ferritin and iron studies in 3 and 6 months and I will  see her back in 6 months.  Also check B12 levels in 3 months   Visit Diagnosis 1. Symptomatic anemia   2. Other iron deficiency anemia   3. B12 deficiency      Dr. Annah Skene, MD, MPH [facility] [phone] 03/15/2024 2:51 PM

## 2024-03-15 NOTE — Progress Notes (Signed)
 generalized pain 5/10 d/t arthritis.

## 2024-03-17 DIAGNOSIS — H35371 Puckering of macula, right eye: Secondary | ICD-10-CM | POA: Diagnosis not present

## 2024-03-17 DIAGNOSIS — H35363 Drusen (degenerative) of macula, bilateral: Secondary | ICD-10-CM | POA: Diagnosis not present

## 2024-03-17 DIAGNOSIS — H26492 Other secondary cataract, left eye: Secondary | ICD-10-CM | POA: Diagnosis not present

## 2024-03-17 DIAGNOSIS — Z961 Presence of intraocular lens: Secondary | ICD-10-CM | POA: Diagnosis not present

## 2024-03-25 ENCOUNTER — Encounter: Payer: Self-pay | Admitting: Primary Care

## 2024-03-25 ENCOUNTER — Ambulatory Visit (INDEPENDENT_AMBULATORY_CARE_PROVIDER_SITE_OTHER): Admitting: Primary Care

## 2024-03-25 DIAGNOSIS — M8589 Other specified disorders of bone density and structure, multiple sites: Secondary | ICD-10-CM | POA: Diagnosis not present

## 2024-03-25 DIAGNOSIS — E6609 Other obesity due to excess calories: Secondary | ICD-10-CM | POA: Diagnosis not present

## 2024-03-25 DIAGNOSIS — Z6834 Body mass index (BMI) 34.0-34.9, adult: Secondary | ICD-10-CM

## 2024-03-25 DIAGNOSIS — E66811 Obesity, class 1: Secondary | ICD-10-CM | POA: Diagnosis not present

## 2024-03-25 NOTE — Progress Notes (Signed)
 Subjective:    Patient ID: [name] [name], female    DOB: September 19, 1935, 88 y.o.   MRN: [id number]  [name] [name] is a very pleasant 88 y.o. female with a history of osteoarthritis, hyperlipidemia, fibromyalgia, chronic MRSA who presents today to discuss bone density and Fosamax  and obesity.  She completed a bone density scan on 12/09/2023 which revealed T-score of -2.3 in left femoral neck, T-score -1.5 in left hip, T-score of -2.2 in right femoral neck, T-score of -1.8 in right total hip, T-score 0.7 to left forearm.  Major Osteoporotic fracture was 22% and risk for hip fracture is 8.3%.  She was determined to be osteopenic.  She is currently managed on Fosamax  70 mg once weekly for which she forgets to take and is frustrated by the specific instructions. She had a friend that got a hole in her stomach. She is taking calcium and vitamin D. Is mostly sedentary during winter months.   She is interested in the GLP 1 treatment for weight loss. She endorses a healthy diet. She's cut back on sugary foods, she eats small portions, and cooks at home. She's frustrated by her lack of weight loss despite her efforts.   Diet currently consists of:  Breakfast: Eggs, croissant Lunch: Hamburgers, frozen dinners, sandwich  Dinner: Veggies, protein, pasta  Snacks: Cheese pretzels, potato chips, nuts  Desserts: chocolate kisses twice daily, ice cream sandwiches Beverages: coffee, water  Exercise: 3 days weekly     Review of Systems  Respiratory:  Negative for shortness of breath.   Cardiovascular:  Negative for chest pain.  Musculoskeletal:  Positive for arthralgias and back pain.         Past Medical History:  Diagnosis Date   Anxiety    prev on prozac   Bilateral pulmonary embolism (HCC) 08/22/2021   Chest pain    Felt to be noncardiac in the past   Chronic rectal fissure    Colon polyp    Dyslipidemia    Fibromyalgia    GERD (gastroesophageal reflux disease)    Barrett's esophagus    Hiatal hernia    Lumbar spine pain    Complex lumbar spine surgery at [facility], 2012, complicated, MRSA, much of the appliance is removed, patient on chronic antibiotics   Melanoma (HCC)    Mitral regurgitation    a. 2007 Echo Mild MR; b. 10/2016 Echo: EF 55-60%, no rwma, GrI DD, Mild MR.   Nausea and vomiting 08/22/2021   Osteoarthritis    Scoliosis    Sinusitis    Multiple sinus operations in the past   Statin intolerance    As of 2009, no further attempts to use statins   Thyroid  nodule     Social History   Socioeconomic History   Marital status: Married    Spouse name: Not on file   Number of children: Not on file   Years of education: Not on file   Highest education level: Not on file  Occupational History   Not on file  Tobacco Use   Smoking status: Never    Passive exposure: Never   Smokeless tobacco: Never  Vaping Use   Vaping status: Never Used  Substance and Sexual Activity   Alcohol use: No   Drug use: No   Sexual activity: Not on file  Other Topics Concern   Not on file  Social History Narrative   From [place]   Married (second [phone]   3 kids, 2 of whom are local  Retired, tree surgeon   Social Drivers of Health   Tobacco Use: Low Risk (03/25/2024)   Patient History    Smoking Tobacco Use: Never    Smokeless Tobacco Use: Never    Passive Exposure: Never  Financial Resource Strain: Low Risk (10/08/2023)   Overall Financial Resource Strain (CARDIA)    Difficulty of Paying Living Expenses: Not hard at all  Food Insecurity: No Food Insecurity (10/08/2023)   Epic    Worried About Programme Researcher, Broadcasting/film/video in the Last Year: Never true    Ran Out of Food in the Last Year: Never true  Transportation Needs: No Transportation Needs (10/08/2023)   Epic    Lack of Transportation (Medical): No    Lack of Transportation (Non-Medical): No  Physical Activity: Insufficiently Active (10/08/2023)   Exercise Vital Sign    Days of Exercise per Week: 3 days     Minutes of Exercise per Session: 30 min  Stress: No Stress Concern Present (10/08/2023)   [employer] of Occupational Health - Occupational Stress Questionnaire    Feeling of Stress: Not at all  Social Connections: Moderately Isolated (10/08/2023)   Social Connection and Isolation Panel    Frequency of Communication with Friends and Family: More than three times a week    Frequency of Social Gatherings with Friends and Family: More than three times a week    Attends Religious Services: Never    Database Administrator or Organizations: No    Attends Banker Meetings: Never    Marital Status: Married  Catering Manager Violence: Not At Risk (10/08/2023)   Epic    Fear of Current or Ex-Partner: No    Emotionally Abused: No    Physically Abused: No    Sexually Abused: No  Depression (PHQ2-9): Low Risk (03/25/2024)   Depression (PHQ2-9)    PHQ-2 Score: 0  Alcohol Screen: Low Risk (10/08/2023)   Alcohol Screen    Last Alcohol Screening Score (AUDIT): 0  Housing: Unknown (10/08/2023)   Epic    Unable to Pay for Housing in the Last Year: No    Number of Times Moved in the Last Year: Not on file    Homeless in the Last Year: No  Utilities: Not At Risk (10/08/2023)   Epic    Threatened with loss of utilities: No  Health Literacy: Adequate Health Literacy (10/08/2023)   B1300 Health Literacy    Frequency of need for help with medical instructions: Never    Past Surgical History:  Procedure Laterality Date   ABDOMINAL HYSTERECTOMY     BACK SURGERY     CHOLECYSTECTOMY     COLONOSCOPY WITH PROPOFOL  N/A 08/07/2022   Procedure: COLONOSCOPY WITH PROPOFOL ;  Surgeon: Therisa Bi, MD;  Location[facility] ENDOSCOPY;  Service: Gastroenterology;  Laterality: N/A;   ESOPHAGOGASTRODUODENOSCOPY (EGD) WITH PROPOFOL  N/A 08/07/2022   Procedure: ESOPHAGOGASTRODUODENOSCOPY (EGD) WITH PROPOFOL ;  Surgeon: Therisa Bi, MD;  Location: [facility] ENDOSCOPY;  Service: Gastroenterology;  Laterality: N/A;    FOOT SURGERY     GIVENS CAPSULE STUDY N/A 10/09/2022   Procedure: GIVENS CAPSULE STUDY;  Surgeon: Therisa Bi, MD;  Location: [facility] ENDOSCOPY;  Service: Gastroenterology;  Laterality: N/A;   HERNIA REPAIR     IR BONE MARROW BIOPSY & ASPIRATION  06/02/2023   KNEE SURGERY Right 08/08/2021   total knee replacement   NASAL SINUS SURGERY      Family History  Problem Relation Age of Onset   GER disease Mother    Dementia  Mother    Breast cancer Mother 36   Heart disease Father        MI at 41   Scoliosis Daughter    Colon cancer Neg Hx     Allergies[1]  Medications Ordered Prior to Encounter[2]  BP 130/68   Pulse 77   Temp 97.9 F (36.6 C) (Oral)   Ht 4' 8 (1.422 m)   Wt 156 lb (70.8 kg)   SpO2 95%   BMI 34.97 kg/m  Objective:   Physical Exam Cardiovascular:     Rate and Rhythm: Normal rate and regular rhythm.  Pulmonary:     Effort: Pulmonary effort is normal.     Breath sounds: Normal breath sounds.  Musculoskeletal:     Cervical back: Neck supple.     Comments: Significantly reduced ROM to lumbar spine and hands. Walks with cane. Altered gait  Skin:    General: Skin is warm and dry.  Neurological:     Mental Status: She is alert and oriented to person, place, and time.  Psychiatric:        Mood and Affect: Mood normal.     Physical Exam        Assessment & Plan:  Class 1 obesity due to excess calories without serious comorbidity with body mass index (BMI) of 34.0 to 34.9 in adult Assessment & Plan: We discussed in length GLP-1 agonist medications including side effects, instructions for administration, and risks.  She would make a good candidate given her history, but I am reticent to prescribe given her age and frailty. We also discussed insurance limitations regarding coverage.  Will consult with cardiology and other providers for second opinion. She is quite persistent to try GLP 1 agonist treatment.      Osteopenia of multiple sites Assessment  & Plan: Reviewed bone density scan from 2025 and 2022. There is a moderate decline in bone density compared to 2022. Her risk for major fracture is high at 22% and also high for hip fracture 8.3%.  Continue alendronate  for now.  We also discussed Boniva which is less frequent administration. Will also consult with [facility] group to see if she qualifies.  Continue calcium and vitamin D.     Assessment and Plan Assessment & Plan  I personally spent a total of 30 minutes in the care of the patient today including preparing to see the patient, getting/reviewing separately obtained history, performing a medically appropriate exam/evaluation, counseling and educating, documenting clinical information in the EHR, and consulting with other providers.        Santhiago Collingsworth K Eliyana Pagliaro, NP       [1]  Allergies Allergen Reactions   Other Other (See Comments)    Other Reaction: when taking for an extended period of time causes mouth ulcers and esophagus irritation Other Reaction: when taking for an extended period of time causes mouth ulcers and esophagus irritation    Fenofibric Acid Other (See Comments)    GI upset   Nsaids     REACTION: No long term use   Statins Other (See Comments)    Intolerant, myalgias Intolerant, myalgias Intolerant, myalgias Intolerant, myalgias Intolerant, myalgias Intolerant, myalgias    Trilipix [Choline Fenofibrate ]     GI upset   Wasp Venom Swelling  [2]  Current Outpatient Medications on File Prior to Visit  Medication Sig Dispense Refill   alendronate  (FOSAMAX ) 70 MG tablet Take 1 tablet (70 mg total) by mouth every 7 (seven) days. Take with a full glass of  water on an empty stomach. Avoid laying flat for 2 hours. 12 tablet 3   ALPHA LIPOIC ACID PO Take 1 tablet by mouth daily. Daily     amitriptyline  (ELAVIL ) 10 MG tablet TAKE 1 TABLET (10 MG TOTAL) BY MOUTH AT BEDTIME FOR HEADACHE PREVENTION 90 tablet 3   calcium carbonate (OS-CAL) 600 MG TABS  Take 1,200 mg by mouth 2 (two) times daily with a meal.     cholecalciferol (VITAMIN D) 1000 UNITS tablet Take 1,000 Units by mouth daily.     ciclopirox (LOPROX) 0.77 % cream Apply 1 g/mL topically 2 (two) times daily.     clotrimazole -betamethasone  (LOTRISONE ) cream Apply topically 2 (two) times daily as needed. 30 g 0   fenofibrate  micronized (LOFIBRA) 134 MG capsule TAKE 1 CAPSULE (134 MG TOTAL) BY MOUTH AT BEDTIME FOR CHOLESTEROL. 90 capsule 2   fish oil-omega-3 fatty acids 1000 MG capsule Take 2 g by mouth 2 (two) times daily.     gabapentin  (NEURONTIN ) 100 MG capsule TAKE 1 CAPSULE BY MOUTH THREE TIMES A DAY 270 capsule 0   Ginger, Zingiber officinalis, (GINGER PO) Take 1 tablet by mouth daily.     levothyroxine  (SYNTHROID ) 50 MCG tablet TAKE 1 TABLET BY MOUTH EVERY MORNING WITH WATER ON AN EMPTY STOMACH. NO OTHER FOODS/MEDS FOR 30 MINS 90 tablet 1   metoprolol  tartrate (LOPRESSOR ) 25 MG tablet TAKE 1 TAB BY MOUTH TWICE A DAY AND EXTRA TABLET AS NEEDED FOR BREAKTHROUGH PALPITATIONS AS DIRECTED (Patient taking differently: 2 (two) times daily. TAKE 1 TAB BY MOUTH TWICE A DAY AND EXTRA TABLET AS NEEDED FOR BREAKTHROUGH PALPITATIONS AS DIRECTED) 180 tablet 3   Multiple Vitamins-Minerals (MULTIVITAMIN WITH MINERALS) tablet Take 1 tablet by mouth daily.     NON FORMULARY Take 1 capsule by mouth daily. Tumeric      Daily     sulfamethoxazole -trimethoprim  (BACTRIM  DS) 800-160 MG tablet TAKE 1 TABLET (160 MG OF TRIMETHOPRIM  TOTAL) BY MOUTH 2 (TWO) TIMES DAILY 180 tablet 2   tretinoin (RETIN-A) 0.05 % cream Apply 1 application topically daily as needed (as directed per dermatologist).   3   metoprolol  tartrate (LOPRESSOR ) 100 MG tablet TAKE 1 TABLET 2 HR PRIOR TO CARDIAC PROCEDURE (Patient not taking: Reported on 03/15/2024) 1 tablet 0   No current facility-administered medications on file prior to visit.

## 2024-03-25 NOTE — Patient Instructions (Signed)
 We will be in touch soon!

## 2024-03-25 NOTE — Assessment & Plan Note (Signed)
 Reviewed bone density scan from 2025 and 2022. There is a moderate decline in bone density compared to 2022. Her risk for major fracture is high at 22% and also high for hip fracture 8.3%.  Continue alendronate  for now.  We also discussed Boniva which is less frequent administration. Will also consult with [facility] group to see if she qualifies.  Continue calcium and vitamin D.

## 2024-03-25 NOTE — Assessment & Plan Note (Addendum)
 We discussed in length GLP-1 agonist medications including side effects, instructions for administration, and risks.  She would make a good candidate given her history, but I am reticent to prescribe given her age and frailty. We also discussed insurance limitations regarding coverage.  Will consult with cardiology and other providers for second opinion. She is quite persistent to try GLP 1 agonist treatment.

## 2024-03-26 ENCOUNTER — Other Ambulatory Visit: Payer: Self-pay | Admitting: *Deleted

## 2024-03-26 DIAGNOSIS — Z9189 Other specified personal risk factors, not elsewhere classified: Secondary | ICD-10-CM

## 2024-03-26 MED ORDER — DENOSUMAB 60 MG/ML ~~LOC~~ SOSY: 60.0000 mg | PREFILLED_SYRINGE | Freq: Once | SUBCUTANEOUS | Status: AC

## 2024-03-29 ENCOUNTER — Other Ambulatory Visit (HOSPITAL_COMMUNITY): Payer: Self-pay

## 2024-03-29 ENCOUNTER — Encounter: Payer: Self-pay | Admitting: Oncology

## 2024-03-29 ENCOUNTER — Telehealth: Payer: Self-pay

## 2024-03-29 NOTE — Telephone Encounter (Signed)
 Prolia  VOB initiated via [email or web address]  Next Prolia  inj DUE: NEW START

## 2024-03-30 ENCOUNTER — Other Ambulatory Visit (HOSPITAL_COMMUNITY): Payer: Self-pay

## 2024-03-30 NOTE — Telephone Encounter (Signed)
 [employer] SUBMITTED VIA LATENT. KEY: [id number]  AETNA MEDICAL PA SUBMITTED VIA NOVOLOGIX. Authorization Number : [id number]

## 2024-03-30 NOTE — Telephone Encounter (Signed)
 SABRA

## 2024-03-31 ENCOUNTER — Other Ambulatory Visit: Payer: Self-pay | Admitting: *Deleted

## 2024-03-31 ENCOUNTER — Encounter: Payer: Self-pay | Admitting: Oncology

## 2024-03-31 ENCOUNTER — Other Ambulatory Visit (HOSPITAL_COMMUNITY): Payer: Self-pay

## 2024-03-31 DIAGNOSIS — Z9189 Other specified personal risk factors, not elsewhere classified: Secondary | ICD-10-CM

## 2024-03-31 NOTE — Telephone Encounter (Signed)
 Pt ready for scheduling for PROLIA  on or after : 03/31/24  Option# 1: Buy/Bill (Office supplied medication)  Out-of-pocket cost due at time of clinic visit: $332  Number of injection/visits approved: 2  Primary: [employer]-MEDICARE Prolia  co-insurance: 20% Admin fee co-insurance: 0%  Secondary: AETNA-COMMERCIAL Prolia  co-insurance:  Admin fee co-insurance:   Medical Benefit Details: Date Benefits were checked: 03/30/24 Deductible: NO/ Coinsurance: 20%/ Admin Fee: 0%  Prior Auth: APPROVED ([employer]BS[place]) PA# [id number] Expiration Date: 03/30/24-03/30/25  # of doses approved: 2  Prior Auth: APPROVED MARDEL) PA# [id number]  Expiration Date: 03/30/24-03/29/25  # of doses approved: 2 ----------------------------------------------------------------------- Option# 2- Med Obtained from pharmacy:  Pharmacy benefit: Copay $--- (Paid to pharmacy) Admin Fee: --- (Pay at clinic)  Prior Auth: --- PA# Expiration Date:   # of doses approved:   If patient wants fill through the pharmacy benefit please send prescription to: ---, and include estimated need by date in rx notes. Pharmacy will ship medication directly to the office.  Patient NOT eligible for Prolia  Copay Card. Copay Card can make patient's cost as little as $25. Link to apply: [email or web address]://[email or web address]  ** This summary of benefits is an estimation of the patient's out-of-pocket cost. Exact cost may very based on individual plan coverage.

## 2024-04-02 ENCOUNTER — Other Ambulatory Visit

## 2024-04-02 ENCOUNTER — Ambulatory Visit: Payer: Self-pay | Admitting: Primary Care

## 2024-04-02 DIAGNOSIS — Z9189 Other specified personal risk factors, not elsewhere classified: Secondary | ICD-10-CM | POA: Diagnosis not present

## 2024-04-02 LAB — BASIC METABOLIC PANEL WITH GFR
BUN: 26 mg/dL — ABNORMAL HIGH (ref 6–23)
CO2: 28 meq/L (ref 19–32)
Calcium: 9.2 mg/dL (ref 8.4–10.5)
Chloride: 103 meq/L (ref 96–112)
Creatinine, Ser: 0.97 mg/dL (ref 0.40–1.20)
GFR: 52.76 mL/min — ABNORMAL LOW
Glucose, Bld: 98 mg/dL (ref 70–99)
Potassium: 4.2 meq/L (ref 3.5–5.1)
Sodium: 141 meq/L (ref 135–145)

## 2024-04-09 ENCOUNTER — Other Ambulatory Visit: Payer: Self-pay | Admitting: Student

## 2024-04-13 ENCOUNTER — Ambulatory Visit

## 2024-04-13 DIAGNOSIS — M8589 Other specified disorders of bone density and structure, multiple sites: Secondary | ICD-10-CM

## 2024-04-13 MED ORDER — DENOSUMAB 60 MG/ML ~~LOC~~ SOSY: 60.0000 mg | PREFILLED_SYRINGE | Freq: Once | SUBCUTANEOUS | Status: AC

## 2024-04-13 MED ORDER — DENOSUMAB 60 MG/ML ~~LOC~~ SOSY
60.0000 mg | PREFILLED_SYRINGE | Freq: Once | SUBCUTANEOUS | Status: AC
  Administered 2024-04-13: 60 mg via SUBCUTANEOUS

## 2024-04-13 NOTE — Progress Notes (Signed)
 Per orders of Aneta Bar, DPN AGNP-C, injection of prolia  60 mg  given by Claretha Crocker in left arm. Patient tolerated injection well. Patient will make appointment for 6 month.

## 2024-04-21 ENCOUNTER — Telehealth: Payer: Self-pay

## 2024-04-21 ENCOUNTER — Other Ambulatory Visit: Payer: Self-pay | Admitting: Orthopaedic Surgery

## 2024-04-21 DIAGNOSIS — M797 Fibromyalgia: Secondary | ICD-10-CM

## 2024-04-21 DIAGNOSIS — M150 Primary generalized (osteo)arthritis: Secondary | ICD-10-CM

## 2024-04-21 DIAGNOSIS — E785 Hyperlipidemia, unspecified: Secondary | ICD-10-CM

## 2024-04-21 DIAGNOSIS — E66811 Obesity, class 1: Secondary | ICD-10-CM

## 2024-04-21 MED ORDER — ZEPBOUND 2.5 MG/0.5ML ~~LOC~~ SOAJ: 2.5000 mg | SUBCUTANEOUS | 0 refills | Status: AC

## 2024-04-21 NOTE — Addendum Note (Signed)
 Addended by: Lashia Niese K on: 04/21/2024 01:37 PM   Modules accepted: Orders

## 2024-04-21 NOTE — Telephone Encounter (Signed)
 Please thank patient for the follow up. We can try a low dose of Zepbound  as long as she is aware of the potential side effects and risks as discussed.  Start tirzepitide (Zepbound ) medication for weight loss.  Start by injecting 2.5 mg into the skin once weekly for 4 weeks, then increase to 5 mg once weekly thereafter.  Please notify me once you have used your last 2.5 mg dose, and I will send the increased dose of 5 mg to your pharmacy.

## 2024-04-21 NOTE — Telephone Encounter (Signed)
 Copied from CRM #[id number]. Topic: General - Call Back - No Documentation >> Apr 21, 2024  9:48 AM [name] [name] ORN wrote: Reason for CRM: pt f/u on weightloss management shots per discussion with provider . pt is requesting Zepbound  . Please call pt back with update.   Per office visit on 03/25/24  Assessment & Plan: We discussed in length GLP-1 agonist medications including side effects, instructions for administration, and risks.   She would make a good candidate given her history, but I am reticent to prescribe given her age and frailty. We also discussed insurance limitations regarding coverage.   Will consult with cardiology and other providers for second opinion. She is quite persistent to try GLP 1 agonist treatment.

## 2024-04-23 ENCOUNTER — Telehealth: Payer: Self-pay

## 2024-04-23 ENCOUNTER — Other Ambulatory Visit (HOSPITAL_COMMUNITY): Payer: Self-pay

## 2024-04-23 NOTE — Telephone Encounter (Signed)
 Pharmacy Patient Advocate Encounter   Received notification from [facility] KEY that prior authorization for Zepbound  2.5 is required/requested.   Insurance verification completed.   The patient is insured through [facility].   Per test claim: PA required; PA submitted to above mentioned insurance via Latent Key/confirmation #/EOC [id number] Status is pending

## 2024-04-23 NOTE — Telephone Encounter (Signed)
 Spoke with pt relaying Kate's message. Pt verbalizes understanding and expresses her thanks.

## 2024-04-28 ENCOUNTER — Other Ambulatory Visit (HOSPITAL_COMMUNITY): Payer: Self-pay

## 2024-04-30 ENCOUNTER — Other Ambulatory Visit (HOSPITAL_COMMUNITY): Payer: Self-pay

## 2024-04-30 NOTE — Telephone Encounter (Signed)
 Pharmacy Patient Advocate Encounter  Received notification from [facility] that Prior Authorization for Zepbound  2.5 has been DENIED.  Full denial letter will be uploaded to the media tab. See denial reason below.     PA #/Case ID/Reference #: # [id number]

## 2024-05-03 NOTE — Telephone Encounter (Signed)
 LMTCB

## 2024-05-03 NOTE — Telephone Encounter (Signed)
 Noted. Please notify patient that Zepbound  is not covered under her insurance company for weight loss.

## 2024-05-04 NOTE — Telephone Encounter (Signed)
 Patient returned call ,and message in regards to zepbound  insurance denial was relayed to her. Patient is requesting a call from medical assistant this afternoon to discuss alternatives. She stated that her daughter knows of a pharmacy where the medication can be compounded.

## 2024-05-04 NOTE — Telephone Encounter (Signed)
 We don't prescribe compounded medication, but she can find that online through health care companies.   Another option is to try [facility] pills. They recently came out and are more affordable cash price. I do not believe her insurance will cover but we can try. She can find a coupon card for [employer].   Let me know what she thinks

## 2024-05-05 NOTE — Telephone Encounter (Signed)
 Spoke with pt relaying Kate's message. Pt verbalizes understanding and wants to do some think about it and do some research. Pt states she will contact Mallie once she figures out what she want to do. Fyi to [place].

## 2024-05-06 NOTE — Telephone Encounter (Signed)
 Noted.

## 2024-05-07 ENCOUNTER — Other Ambulatory Visit: Payer: Self-pay | Admitting: Primary Care

## 2024-05-07 DIAGNOSIS — Z1231 Encounter for screening mammogram for malignant neoplasm of breast: Secondary | ICD-10-CM

## 2024-05-31 ENCOUNTER — Ambulatory Visit: Admitting: Physician Assistant

## 2024-06-01 ENCOUNTER — Ambulatory Visit

## 2024-06-14 ENCOUNTER — Inpatient Hospital Stay

## 2024-09-13 ENCOUNTER — Inpatient Hospital Stay: Admitting: Oncology

## 2024-09-13 ENCOUNTER — Inpatient Hospital Stay

## 2024-10-08 ENCOUNTER — Ambulatory Visit
